# Patient Record
Sex: Male | Born: 1935 | Race: White | Hispanic: No | Marital: Married | Smoking: Never smoker
Health system: Southern US, Community
[De-identification: ages and names within clinical notes are randomized; demographics above are authoritative.]

## PROBLEM LIST (undated history)

## (undated) DIAGNOSIS — J45909 Unspecified asthma, uncomplicated: Secondary | ICD-10-CM

## (undated) DIAGNOSIS — I5042 Chronic combined systolic (congestive) and diastolic (congestive) heart failure: Secondary | ICD-10-CM

## (undated) DIAGNOSIS — I272 Pulmonary hypertension, unspecified: Secondary | ICD-10-CM

## (undated) DIAGNOSIS — I219 Acute myocardial infarction, unspecified: Secondary | ICD-10-CM

## (undated) DIAGNOSIS — F0390 Unspecified dementia without behavioral disturbance: Secondary | ICD-10-CM

## (undated) DIAGNOSIS — N139 Obstructive and reflux uropathy, unspecified: Secondary | ICD-10-CM

## (undated) DIAGNOSIS — R55 Syncope and collapse: Secondary | ICD-10-CM

## (undated) DIAGNOSIS — I10 Essential (primary) hypertension: Secondary | ICD-10-CM

## (undated) DIAGNOSIS — I2510 Atherosclerotic heart disease of native coronary artery without angina pectoris: Secondary | ICD-10-CM

## (undated) DIAGNOSIS — E119 Type 2 diabetes mellitus without complications: Secondary | ICD-10-CM

## (undated) DIAGNOSIS — I739 Peripheral vascular disease, unspecified: Secondary | ICD-10-CM

## (undated) DIAGNOSIS — I77810 Thoracic aortic ectasia: Secondary | ICD-10-CM

## (undated) DIAGNOSIS — I255 Ischemic cardiomyopathy: Secondary | ICD-10-CM

## (undated) DIAGNOSIS — K219 Gastro-esophageal reflux disease without esophagitis: Secondary | ICD-10-CM

## (undated) DIAGNOSIS — Z8719 Personal history of other diseases of the digestive system: Secondary | ICD-10-CM

## (undated) DIAGNOSIS — R0602 Shortness of breath: Secondary | ICD-10-CM

## (undated) DIAGNOSIS — I214 Non-ST elevation (NSTEMI) myocardial infarction: Secondary | ICD-10-CM

## (undated) DIAGNOSIS — N400 Enlarged prostate without lower urinary tract symptoms: Secondary | ICD-10-CM

## (undated) DIAGNOSIS — N182 Chronic kidney disease, stage 2 (mild): Secondary | ICD-10-CM

## (undated) DIAGNOSIS — K635 Polyp of colon: Secondary | ICD-10-CM

## (undated) DIAGNOSIS — E785 Hyperlipidemia, unspecified: Secondary | ICD-10-CM

## (undated) HISTORY — DX: Acute myocardial infarction, unspecified: I21.9

## (undated) HISTORY — DX: Peripheral vascular disease, unspecified: I73.9

## (undated) HISTORY — DX: Atherosclerotic heart disease of native coronary artery without angina pectoris: I25.10

## (undated) HISTORY — DX: Personal history of other diseases of the digestive system: Z87.19

## (undated) HISTORY — PX: CATARACT EXTRACTION W/ INTRAOCULAR LENS  IMPLANT, BILATERAL: SHX1307

## (undated) HISTORY — DX: Polyp of colon: K63.5

## (undated) HISTORY — DX: Benign prostatic hyperplasia without lower urinary tract symptoms: N40.0

## (undated) HISTORY — PX: RETINAL DETACHMENT SURGERY: SHX105

## (undated) HISTORY — DX: Ischemic cardiomyopathy: I25.5

## (undated) HISTORY — DX: Obstructive and reflux uropathy, unspecified: N13.9

## (undated) HISTORY — PX: SHOULDER OPEN ROTATOR CUFF REPAIR: SHX2407

## (undated) HISTORY — DX: Hyperlipidemia, unspecified: E78.5

## (undated) HISTORY — PX: INGUINAL HERNIA REPAIR: SUR1180

## (undated) HISTORY — DX: Unspecified asthma, uncomplicated: J45.909

---

## 1995-10-16 DIAGNOSIS — I219 Acute myocardial infarction, unspecified: Secondary | ICD-10-CM

## 1995-10-16 HISTORY — PX: CORONARY ARTERY BYPASS GRAFT: SHX141

## 1995-10-16 HISTORY — DX: Acute myocardial infarction, unspecified: I21.9

## 1999-06-16 HISTORY — PX: CHOLECYSTECTOMY: SHX55

## 2001-05-30 ENCOUNTER — Encounter (INDEPENDENT_AMBULATORY_CARE_PROVIDER_SITE_OTHER): Payer: Self-pay | Admitting: Specialist

## 2001-05-30 ENCOUNTER — Other Ambulatory Visit: Admission: RE | Admit: 2001-05-30 | Discharge: 2001-05-30 | Payer: Self-pay | Admitting: Gastroenterology

## 2002-02-17 ENCOUNTER — Encounter: Payer: Self-pay | Admitting: Orthopedic Surgery

## 2002-02-24 ENCOUNTER — Inpatient Hospital Stay (HOSPITAL_COMMUNITY): Admission: RE | Admit: 2002-02-24 | Discharge: 2002-02-25 | Payer: Self-pay | Admitting: Orthopedic Surgery

## 2003-05-18 ENCOUNTER — Encounter: Admission: RE | Admit: 2003-05-18 | Discharge: 2003-05-18 | Payer: Self-pay | Admitting: Otolaryngology

## 2003-05-18 ENCOUNTER — Encounter: Payer: Self-pay | Admitting: Otolaryngology

## 2003-06-03 ENCOUNTER — Ambulatory Visit (HOSPITAL_COMMUNITY): Admission: EM | Admit: 2003-06-03 | Discharge: 2003-06-03 | Payer: Self-pay | Admitting: Otolaryngology

## 2003-12-01 ENCOUNTER — Ambulatory Visit (HOSPITAL_COMMUNITY): Admission: RE | Admit: 2003-12-01 | Discharge: 2003-12-01 | Payer: Self-pay | Admitting: Pulmonary Disease

## 2003-12-06 ENCOUNTER — Ambulatory Visit (HOSPITAL_COMMUNITY): Admission: RE | Admit: 2003-12-06 | Discharge: 2003-12-06 | Payer: Self-pay | Admitting: Pulmonary Disease

## 2004-10-20 ENCOUNTER — Ambulatory Visit: Payer: Self-pay | Admitting: Internal Medicine

## 2004-10-27 ENCOUNTER — Ambulatory Visit: Payer: Self-pay | Admitting: Internal Medicine

## 2004-11-23 ENCOUNTER — Ambulatory Visit: Payer: Self-pay | Admitting: Gastroenterology

## 2004-12-11 ENCOUNTER — Ambulatory Visit: Payer: Self-pay | Admitting: Gastroenterology

## 2005-03-23 ENCOUNTER — Ambulatory Visit (HOSPITAL_COMMUNITY): Admission: RE | Admit: 2005-03-23 | Discharge: 2005-03-23 | Payer: Self-pay | Admitting: Internal Medicine

## 2005-03-23 ENCOUNTER — Ambulatory Visit: Payer: Self-pay | Admitting: Internal Medicine

## 2005-10-15 HISTORY — PX: NASAL SEPTUM SURGERY: SHX37

## 2005-10-18 ENCOUNTER — Ambulatory Visit: Payer: Self-pay | Admitting: Internal Medicine

## 2005-10-20 ENCOUNTER — Emergency Department (HOSPITAL_COMMUNITY): Admission: EM | Admit: 2005-10-20 | Discharge: 2005-10-20 | Payer: Self-pay | Admitting: Emergency Medicine

## 2005-10-22 ENCOUNTER — Ambulatory Visit: Payer: Self-pay | Admitting: Internal Medicine

## 2006-04-16 ENCOUNTER — Ambulatory Visit: Payer: Self-pay | Admitting: Internal Medicine

## 2006-07-31 ENCOUNTER — Inpatient Hospital Stay (HOSPITAL_COMMUNITY): Admission: AD | Admit: 2006-07-31 | Discharge: 2006-08-06 | Payer: Self-pay | Admitting: Internal Medicine

## 2006-07-31 ENCOUNTER — Ambulatory Visit: Payer: Self-pay | Admitting: Internal Medicine

## 2006-08-05 ENCOUNTER — Ambulatory Visit: Payer: Self-pay | Admitting: Internal Medicine

## 2006-08-09 ENCOUNTER — Ambulatory Visit: Payer: Self-pay | Admitting: Internal Medicine

## 2006-09-04 ENCOUNTER — Ambulatory Visit: Payer: Self-pay | Admitting: Internal Medicine

## 2006-09-06 ENCOUNTER — Ambulatory Visit: Payer: Self-pay | Admitting: Cardiology

## 2006-09-11 ENCOUNTER — Ambulatory Visit: Payer: Self-pay | Admitting: Internal Medicine

## 2006-09-11 LAB — CONVERTED CEMR LAB
ALT: 17 units/L (ref 0–40)
AST: 20 units/L (ref 0–37)
Albumin: 3.6 g/dL (ref 3.5–5.2)
Alkaline Phosphatase: 64 units/L (ref 39–117)
CO2: 28 meq/L (ref 19–32)
Creatinine, Ser: 0.9 mg/dL (ref 0.4–1.5)
Glomerular Filtration Rate, Af Am: 105 mL/min/{1.73_m2}
Glucose, Bld: 168 mg/dL — ABNORMAL HIGH (ref 70–99)
HDL: 36.1 mg/dL — ABNORMAL LOW (ref >39.0)
LDL DIRECT: 173.9 mg/dL
Potassium: 4.2 meq/L (ref 3.5–5.1)
Sodium: 136 meq/L (ref 135–145)
Total Bilirubin: 0.8 mg/dL (ref 0.3–1.2)
VLDL: 19 mg/dL (ref 0–40)

## 2006-09-12 ENCOUNTER — Ambulatory Visit: Payer: Self-pay | Admitting: Internal Medicine

## 2006-10-01 ENCOUNTER — Ambulatory Visit: Payer: Self-pay | Admitting: Internal Medicine

## 2006-10-01 LAB — CONVERTED CEMR LAB
Albumin: 3.5 g/dL (ref 3.5–5.2)
Chol/HDL Ratio, serum: 4.4
LDL Cholesterol: 110 mg/dL — ABNORMAL HIGH (ref 0–99)
Total Bilirubin: 0.7 mg/dL (ref 0.3–1.2)
Triglyceride fasting, serum: 91 mg/dL (ref 0–149)

## 2006-10-03 ENCOUNTER — Ambulatory Visit: Payer: Self-pay | Admitting: Cardiology

## 2006-10-07 ENCOUNTER — Inpatient Hospital Stay (HOSPITAL_COMMUNITY): Admission: EM | Admit: 2006-10-07 | Discharge: 2006-10-10 | Payer: Self-pay | Admitting: Emergency Medicine

## 2006-10-14 ENCOUNTER — Ambulatory Visit: Payer: Self-pay | Admitting: Gastroenterology

## 2006-11-05 ENCOUNTER — Ambulatory Visit: Payer: Self-pay | Admitting: Internal Medicine

## 2006-11-05 LAB — CONVERTED CEMR LAB
ALT: 17 units/L (ref 0–40)
Albumin: 3.7 g/dL (ref 3.5–5.2)
Alkaline Phosphatase: 70 units/L (ref 39–117)
BUN: 16 mg/dL (ref 6–23)
CO2: 31 meq/L (ref 19–32)
GFR calc non Af Amer: 99 mL/min
Glucose, Bld: 110 mg/dL — ABNORMAL HIGH (ref 70–99)
Potassium: 4.4 meq/L (ref 3.5–5.1)
Total Bilirubin: 0.8 mg/dL (ref 0.3–1.2)
Total Protein: 6.7 g/dL (ref 6.0–8.3)

## 2006-11-28 ENCOUNTER — Ambulatory Visit (HOSPITAL_COMMUNITY): Admission: RE | Admit: 2006-11-28 | Discharge: 2006-11-28 | Payer: Self-pay | Admitting: Gastroenterology

## 2006-12-03 ENCOUNTER — Ambulatory Visit: Payer: Self-pay | Admitting: Gastroenterology

## 2006-12-19 ENCOUNTER — Ambulatory Visit: Payer: Self-pay | Admitting: Internal Medicine

## 2007-03-01 ENCOUNTER — Inpatient Hospital Stay (HOSPITAL_COMMUNITY): Admission: EM | Admit: 2007-03-01 | Discharge: 2007-03-16 | Payer: Self-pay | Admitting: Emergency Medicine

## 2007-03-03 ENCOUNTER — Ambulatory Visit: Payer: Self-pay | Admitting: Internal Medicine

## 2007-03-10 ENCOUNTER — Encounter (INDEPENDENT_AMBULATORY_CARE_PROVIDER_SITE_OTHER): Payer: Self-pay | Admitting: General Surgery

## 2007-03-11 ENCOUNTER — Ambulatory Visit: Payer: Self-pay | Admitting: Gastroenterology

## 2007-03-16 ENCOUNTER — Encounter: Payer: Self-pay | Admitting: Internal Medicine

## 2007-06-18 ENCOUNTER — Encounter: Payer: Self-pay | Admitting: Internal Medicine

## 2007-06-18 DIAGNOSIS — E109 Type 1 diabetes mellitus without complications: Secondary | ICD-10-CM | POA: Insufficient documentation

## 2007-06-18 DIAGNOSIS — Z860100 Personal history of colon polyps, unspecified: Secondary | ICD-10-CM | POA: Insufficient documentation

## 2007-06-18 DIAGNOSIS — Z8719 Personal history of other diseases of the digestive system: Secondary | ICD-10-CM | POA: Insufficient documentation

## 2007-06-18 DIAGNOSIS — E785 Hyperlipidemia, unspecified: Secondary | ICD-10-CM | POA: Insufficient documentation

## 2007-06-18 DIAGNOSIS — J309 Allergic rhinitis, unspecified: Secondary | ICD-10-CM | POA: Insufficient documentation

## 2007-06-18 DIAGNOSIS — N400 Enlarged prostate without lower urinary tract symptoms: Secondary | ICD-10-CM | POA: Insufficient documentation

## 2007-06-18 DIAGNOSIS — J45909 Unspecified asthma, uncomplicated: Secondary | ICD-10-CM

## 2007-06-18 DIAGNOSIS — Z951 Presence of aortocoronary bypass graft: Secondary | ICD-10-CM | POA: Insufficient documentation

## 2007-06-18 HISTORY — DX: Unspecified asthma, uncomplicated: J45.909

## 2007-09-20 ENCOUNTER — Encounter: Admission: RE | Admit: 2007-09-20 | Discharge: 2007-09-20 | Payer: Self-pay | Admitting: Orthopedic Surgery

## 2008-07-01 ENCOUNTER — Encounter: Payer: Self-pay | Admitting: Internal Medicine

## 2008-07-07 ENCOUNTER — Telehealth: Payer: Self-pay | Admitting: Internal Medicine

## 2008-07-09 ENCOUNTER — Encounter: Payer: Self-pay | Admitting: Internal Medicine

## 2008-07-22 ENCOUNTER — Encounter: Payer: Self-pay | Admitting: Internal Medicine

## 2008-07-30 ENCOUNTER — Encounter: Payer: Self-pay | Admitting: Internal Medicine

## 2008-08-10 ENCOUNTER — Encounter: Payer: Self-pay | Admitting: Internal Medicine

## 2008-08-12 ENCOUNTER — Ambulatory Visit: Payer: Self-pay | Admitting: Internal Medicine

## 2008-08-12 LAB — CONVERTED CEMR LAB
Calcium: 9.1 mg/dL (ref 8.4–10.5)
Creatinine, Ser: 0.8 mg/dL (ref 0.4–1.5)
GFR calc non Af Amer: 99 mL/min
HDL: 50 mg/dL (ref >39.0)
Hgb A1c MFr Bld: 7.7 % — ABNORMAL HIGH (ref 4.6–6.0)
LDL Cholesterol: 86 mg/dL (ref 0–99)
Total Bilirubin: 0.8 mg/dL (ref 0.3–1.2)
Total CHOL/HDL Ratio: 3
Triglycerides: 65 mg/dL (ref 0–149)

## 2008-08-19 ENCOUNTER — Ambulatory Visit: Payer: Self-pay | Admitting: Internal Medicine

## 2008-08-19 DIAGNOSIS — N529 Male erectile dysfunction, unspecified: Secondary | ICD-10-CM | POA: Insufficient documentation

## 2008-08-25 ENCOUNTER — Telehealth: Payer: Self-pay | Admitting: Internal Medicine

## 2008-09-03 ENCOUNTER — Telehealth: Payer: Self-pay | Admitting: Internal Medicine

## 2008-09-10 ENCOUNTER — Encounter: Payer: Self-pay | Admitting: Internal Medicine

## 2008-09-15 ENCOUNTER — Telehealth: Payer: Self-pay | Admitting: Internal Medicine

## 2008-10-25 ENCOUNTER — Encounter: Payer: Self-pay | Admitting: Internal Medicine

## 2008-11-05 ENCOUNTER — Encounter: Payer: Self-pay | Admitting: Internal Medicine

## 2009-04-28 ENCOUNTER — Encounter: Payer: Self-pay | Admitting: Internal Medicine

## 2009-05-09 ENCOUNTER — Encounter: Payer: Self-pay | Admitting: Internal Medicine

## 2009-05-11 ENCOUNTER — Telehealth: Payer: Self-pay | Admitting: Internal Medicine

## 2009-05-13 ENCOUNTER — Telehealth: Payer: Self-pay | Admitting: Internal Medicine

## 2009-07-04 ENCOUNTER — Encounter: Payer: Self-pay | Admitting: Internal Medicine

## 2009-08-04 ENCOUNTER — Ambulatory Visit: Payer: Self-pay | Admitting: Internal Medicine

## 2009-08-08 ENCOUNTER — Encounter: Payer: Self-pay | Admitting: Internal Medicine

## 2009-08-09 ENCOUNTER — Encounter: Admission: RE | Admit: 2009-08-09 | Discharge: 2009-08-09 | Payer: Self-pay | Admitting: General Surgery

## 2009-08-24 ENCOUNTER — Telehealth: Payer: Self-pay | Admitting: Internal Medicine

## 2009-08-24 ENCOUNTER — Encounter: Payer: Self-pay | Admitting: Internal Medicine

## 2009-08-31 ENCOUNTER — Encounter: Payer: Self-pay | Admitting: Internal Medicine

## 2009-10-22 ENCOUNTER — Encounter: Payer: Self-pay | Admitting: Internal Medicine

## 2009-10-24 ENCOUNTER — Telehealth: Payer: Self-pay | Admitting: Internal Medicine

## 2009-10-27 ENCOUNTER — Telehealth: Payer: Self-pay | Admitting: Internal Medicine

## 2009-11-07 ENCOUNTER — Encounter: Payer: Self-pay | Admitting: Internal Medicine

## 2009-11-09 ENCOUNTER — Encounter: Payer: Self-pay | Admitting: Internal Medicine

## 2010-03-09 ENCOUNTER — Ambulatory Visit: Payer: Self-pay | Admitting: Internal Medicine

## 2010-03-09 DIAGNOSIS — H8110 Benign paroxysmal vertigo, unspecified ear: Secondary | ICD-10-CM

## 2010-03-09 LAB — CONVERTED CEMR LAB
ALT: 18 units/L (ref 0–53)
Albumin: 4.2 g/dL (ref 3.5–5.2)
Calcium: 8.9 mg/dL (ref 8.4–10.5)
Cholesterol: 153 mg/dL (ref 0–200)
GFR calc non Af Amer: 99.81 mL/min (ref >60)
Glucose, Bld: 181 mg/dL — ABNORMAL HIGH (ref 70–99)
HDL: 47.4 mg/dL (ref >39.00)
Hgb A1c MFr Bld: 6.8 % — ABNORMAL HIGH (ref 4.6–6.5)
Sodium: 137 meq/L (ref 135–145)
Total Protein: 6.6 g/dL (ref 6.0–8.3)
Triglycerides: 84 mg/dL (ref 0.0–149.0)
VLDL: 16.8 mg/dL (ref 0.0–40.0)

## 2010-03-10 ENCOUNTER — Encounter: Payer: Self-pay | Admitting: Internal Medicine

## 2010-08-10 ENCOUNTER — Encounter: Payer: Self-pay | Admitting: Internal Medicine

## 2010-08-10 ENCOUNTER — Telehealth: Payer: Self-pay | Admitting: Internal Medicine

## 2010-08-10 LAB — HM DIABETES FOOT EXAM: HM Diabetic Foot Exam: NORMAL

## 2010-09-15 ENCOUNTER — Encounter: Payer: Self-pay | Admitting: Internal Medicine

## 2010-11-13 ENCOUNTER — Encounter: Payer: Self-pay | Admitting: Internal Medicine

## 2010-11-16 NOTE — Letter (Signed)
[facility] [address] [place], [place]  [address] Phone: [phone]      Mar 14, 2010   [name] [address] [place], [place] [address]  RE:  LAB RESULTS  Dear  [name],  The following is an interpretation of your most recent lab tests.  Please take note of any instructions provided or changes to medications that have resulted from your lab work.  ELECTROLYTES:  Good - no changes needed  LIVER FUNCTION TESTS:  Good - no changes needed  Health professionals look at cholesterol as more involved than just the total cholesterol. We consider the level of LDL (bad) cholesterol, HDL (good), cholesterol, and Triglycerides (Grease) in the blood.  1. Your LDL should be under 100, and the HDL should be over 45, if you have any vascular disease such as heart attack, angina, stroke, TIA (mini stroke), claudication (pain in the legs when you walk due to poor circulation),  Abdominal Aortic Aneurysm (AAA), diabetes or prediabetes.  2. Your LDL should be under 130 if you have any two of the following:     a. Smoke or chew tobacco,     b. High blood pressure (if you are on medication or over 140/90 without medication),     c. Male gender,    d. HDL below 40,    e. A male relative (father, brother, or son), who have had any vascular event          as described in #1. above under the age of 74, or a male relative (mother,       sister, or daughter) who had an event as described above under age 15. (An HDL over 60 will subtract one risk factor from the total, so if you have two items in # 2 above, but an HDL over 60, you then fall into category # 3 below).  3. Your LDL should be under 160 if you have any one of the above.  Triglycerides should be under 200 with the ideal being under 150.  For diabetes or pre-diabetes, the ideal HgbA1C should be under 6.0%.  If you fall into any of the above categories, you should make a follow up appointment to discuss this with  your physician.  LIPID PANEL:  Good - no changes needed Triglyceride: 84.0   Cholesterol: 153   LDL: 89   HDL: 47.40   Chol/HDL%:  3  DIABETIC STUDIES:  Good - no changes needed Blood Glucose: 181   HgbA1C: 6.8      Good control of diabetes and cholesterol. Keep up the good work.    Sincerely Yours,    Jacques Navy MD Patient: [name] Note: All result statuses are Final unless otherwise noted.  Tests: (1) Lipid Panel (LIPID)   Cholesterol               153 mg/dL                   0-109     ATP III Classification            Desirable:  < 200 mg/dL                    Borderline High:  200 - 239 mg/dL               High:  > = 240 mg/dL   Triglycerides  84.0 mg/dL                  1.6-109.6     Normal:  <150 mg/dL     Borderline High:  045 - 199 mg/dL   HDL                       40.98 mg/dL                 >11.91   VLDL Cholesterol          16.8 mg/dL                  4.7-82.9   LDL Cholesterol           89 mg/dL                    5-62  CHO/HDL Ratio:  CHD Risk                             3                    Men          Women     1/2 Average Risk     3.4          3.3     Average Risk          5.0          4.4     2X Average Risk          9.6          7.1     3X Average Risk          15.0          11.0                           Tests: (2) Hepatic/Liver Function Panel (HEPATIC)   Total Bilirubin           0.6 mg/dL                   1.3-0.8   Direct Bilirubin          0.1 mg/dL                   6.5-7.8   Alkaline Phosphatase      74 U/L                      39-117   AST                       22 U/L                      0-37   ALT                       18 U/L                      0-53   Total Protein             6.6 g/dL                    4.6-9.6   Albumin  4.2 g/dL                    0.9-8.1  Tests: (3) Hemoglobin A1C (A1C)   Hemoglobin A1C       [H]  6.8 %                       4.6-6.5     Glycemic Control Guidelines for People  with Diabetes:     Non Diabetic:  <6%     Goal of Therapy: <7%     Additional Action Suggested:  >8%   Tests: (4) BMP (METABOL)   Sodium                    137 mEq/L                   135-145   Potassium                 4.2 mEq/L                   3.5-5.1   Chloride                  103 mEq/L                   96-112   Carbon Dioxide            27 mEq/L                    19-32   Glucose              [H]  181 mg/dL                   19-14   BUN                       22 mg/dL                    7-82   Creatinine                0.8 mg/dL                   9.5-6.2   Calcium                   8.9 mg/dL                   1.3-08.6   GFR                       99.81 mL/min                >60

## 2010-11-16 NOTE — Progress Notes (Signed)
Summary: TESTING SUPPLIES  Phone Note Call from Patient   Summary of Call: Pt's wife called regarding diabetic testing supplies order. Informed pt that form was completed by Dr and will be faxed today Initial call taken by: Lamar Sprinkles, CMA,  October 24, 2009 9:55 AM

## 2010-11-16 NOTE — Letter (Signed)
Summary: [name]  [facility]   Imported By: Lennie Odor 08/14/2010 09:07:17  _____________________________________________________________________  External Attachment:    Type:   Image     Comment:   External Document

## 2010-11-16 NOTE — Letter (Signed)
Summary: [facility]  [facility]   Imported By: Sherian Rein 11/14/2009 11:58:25  _____________________________________________________________________  External Attachment:    Type:   Image     Comment:   External Document

## 2010-11-16 NOTE — Progress Notes (Signed)
  Phone Note Refill Request Message from:  Fax from Pharmacy on October 24, 2009 9:20 AM  Refills Requested: Medication #1:  ACTOS 30 MG  TABS once daily Initial call taken by: Ami Bullins CMA,  October 24, 2009 9:20 AM    Prescriptions: ACTOS 30 MG  TABS (PIOGLITAZONE HCL) once daily  #90 x 3   Entered by:   Ami Bullins CMA   Authorized by:   Jacques Navy MD   Signed by:   Bill Salinas CMA on 10/24/2009   Method used:   Electronically to        [facility]* (mail-order)             ,          Ph: [phone]       Fax: [phone]   RxID:   [id number]

## 2010-11-16 NOTE — Assessment & Plan Note (Signed)
Summary: LOOSING WEIGHT/NWS   Vital Signs:  Patient profile:   77 year old male Weight:      191 pounds O2 Sat:      95 % on Room air Temp:     97.2 degrees F oral Pulse rate:   73 / minute BP sitting:   126 / 60  (left arm) Cuff size:   regular  Vitals Entered By: Bill Salinas CMA (Mar 09, 2010 10:57 AM)  O2 Flow:  Room air CC: pt here with c/o weight loss and being off balence x 3 months/ ab   Primary Care Provider:  Jacques Navy MD  CC:  pt here with c/o weight loss and being off balence x 3 months/ ab.  History of Present Illness: Patient reports he has had increasing balance trouble that is associated with position change.  He has lost 11 lbs over the past 3 months. He admits that he is eating less. NO change in bowel habit, no night sweats. He has had much better blood sugar control on Lantus which may account for weight loss.  He reports that his blood sugar is 90-100 on Lantus 25 units at bedtime.   Current Medications (verified): 1)  Metoprolol Succinate 50 Mg  Tb24 (Metoprolol Succinate) .... 1/2 Two Times A Day 2)  Plavix 75 Mg  Tabs (Clopidogrel Bisulfate) .... Once Daily 3)  Ecotrin 325mg  .... Once Daily 4)  Multivitamins   Tabs (Multiple Vitamin) .... Take 1 Tablet By Mouth Once A Day 5)  Lantus Solostar 100 Unit/ml Soln (Insulin Glargine) .... 25 Units and Titrate Up. 6)  Doxazosin Mesylate 4 Mg  Tabs (Doxazosin Mesylate) .... Once Daily 7)  Actos 30 Mg  Tabs (Pioglitazone Hcl) .... Once Daily 8)  Advair Diskus 100-50 Mcg/dose  Misc (Fluticasone-Salmeterol) .... One Inhalation Two Times A Day 9)  Cialis 20 Mg Tabs (Tadalafil) .Marland Kitchen.. 1 As Needed  Allergies (verified): 1)  ! * Vytorin 2)  ! * Welchol PMH-FH-SH reviewed-no changes except otherwise noted  Review of Systems       The patient complains of weight loss and decreased hearing.  The patient denies anorexia, fever, chest pain, dyspnea on exertion, abdominal pain, severe indigestion/heartburn,  muscle weakness, difficulty walking, unusual weight change, and enlarged lymph nodes.    Physical Exam  General:  WNWD white male in no distress Head:  normocephalic and atraumatic.   Eyes:  pupils equal, pupils round, corneas and lenses clear, and no injection.   Neck:  supple.   Lungs:  normal respiratory effort and normal breath sounds.   Heart:  normal rate, regular rhythm, and no murmur.   Abdomen:  soft and normal bowel sounds.   Msk:  no joint tenderness, no joint swelling, and no joint deformities.   Pulses:  2+ radial Neurologic:  alert & oriented X3, cranial nerves II-XII intact, and gait normal.   Skin:  turgor normal and color normal.   Psych:  Oriented X3, memory intact for recent and remote, and good eye contact.     Impression & Recommendations:  Problem # 1:  BENIGN POSITIONAL VERTIGO (ICD-386.11) Reviewed mechanism with patient (cartoon included). His symptoms are mild.  Plan - "Rule of 20"  Problem # 2:  HYPERLIPIDEMIA (ICD-272.4) Forlab today. recommendations to follow  Orders: TLB-Lipid Panel (80061-LIPID) TLB-Hepatic/Liver Function Pnl (80076-HEPATIC)  Addendum - LDL 89 - at goal  Problem # 3:  DIABETES MELLITUS, TYPE I (ICD-250.01) Due for A1C.  His updated medication list for  this problem includes:    Lantus Solostar 100 Unit/ml Soln (Insulin glargine) .Marland Kitchen... 25 units and titrate up.    Actos 30 Mg Tabs (Pioglitazone hcl) ..... Once daily  Orders: TLB-A1C / Hgb A1C (Glycohemoglobin) (83036-A1C) TLB-BMP (Basic Metabolic Panel-BMET) (80048-METABOL)  Addendum - A1C 6.8%  Plan continue present medications.   Complete Medication List: 1)  Metoprolol Succinate 50 Mg Tb24 (Metoprolol succinate) .... 1/2 two times a day 2)  Plavix 75 Mg Tabs (Clopidogrel bisulfate) .... Once daily 3)  Ecotrin 325mg   .... Once daily 4)  Multivitamins Tabs (Multiple vitamin) .... Take 1 tablet by mouth once a day 5)  Lantus Solostar 100 Unit/ml Soln (Insulin glargine)  .... 25 units and titrate up. 6)  Doxazosin Mesylate 4 Mg Tabs (Doxazosin mesylate) .... Once daily 7)  Actos 30 Mg Tabs (Pioglitazone hcl) .... Once daily 8)  Advair Diskus 100-50 Mcg/dose Misc (Fluticasone-salmeterol) .... One inhalation two times a day 9)  Cialis 20 Mg Tabs (Tadalafil) .Marland Kitchen.. 1 as needed Prescriptions: CIALIS 20 MG TABS (TADALAFIL) 1 as needed  #15 x 3   Entered and Authorized by:   Jacques Navy MD   Signed by:   Jacques Navy MD on 03/09/2010   Method used:   Electronically to        [facility]* (mail-order)             ,          Ph: [phone]       Fax: [phone]   RxID:   [id number]

## 2010-11-16 NOTE — Medication Information (Signed)
Summary: [facility]  [facility]   Imported By: Sherian Rein 10/25/2009 14:16:40  _____________________________________________________________________  External Attachment:    Type:   Image     Comment:   External Document

## 2010-11-16 NOTE — Letter (Signed)
Summary: [facility]  [facility]   Imported By: [place] 10/02/2010 10:07:17  _____________________________________________________________________  External Attachment:    Type:   Image     Comment:   External Document

## 2010-11-16 NOTE — Progress Notes (Signed)
     Diabetes Management Exam:    Eye Exam:       Eye Exam done elsewhere          Date: 08/10/2010          Results: normal          Done by: Dr Jethro Bolus

## 2010-11-16 NOTE — Letter (Signed)
Summary: [facility]  [facility]   Imported By: Sherian Rein 03/25/2010 08:51:06  _____________________________________________________________________  External Attachment:    Type:   Image     Comment:   External Document

## 2010-11-16 NOTE — Progress Notes (Signed)
  Phone Note Refill Request Message from:  Fax from Pharmacy on October 27, 2009 2:36 PM  Refills Requested: Medication #1:  LANTUS SOLOSTAR 100 UNIT/ML SOLN 25 units and titrate up. Initial call taken by: Ami Bullins CMA,  October 27, 2009 2:36 PM    Prescriptions: LANTUS SOLOSTAR 100 UNIT/ML SOLN (INSULIN GLARGINE) 25 units and titrate up.  #9 pens x 3   Entered by:   Ami Bullins CMA   Authorized by:   Jacques Navy MD   Signed by:   Bill Salinas CMA on 10/27/2009   Method used:   Electronically to        [facility]* (mail-order)             ,          Ph: [phone]       Fax: [phone]   RxID:   [id number]

## 2010-11-30 NOTE — Letter (Signed)
Summary: [facility]  [facility]   Imported By: Benigno Palermo 11/23/2010 09:15:26  _____________________________________________________________________  External Attachment:    Type:   Image     Comment:   External Document

## 2011-02-09 ENCOUNTER — Telehealth: Payer: Self-pay | Admitting: *Deleted

## 2011-02-09 MED ORDER — INSULIN GLARGINE 100 UNIT/ML ~~LOC~~ SOLN: 25.0000 [IU] | Freq: Every day | SUBCUTANEOUS | Status: DC

## 2011-02-09 NOTE — Telephone Encounter (Signed)
Needs lantus where pt is on vacation - done, need to call pt to inform

## 2011-02-09 NOTE — Telephone Encounter (Signed)
Patient informed. 

## 2011-02-27 NOTE — Discharge Summary (Signed)
NAMEJIMMY, [name] NO.:  [id number]   MEDICAL RECORD NO.:  [id number]          PATIENT TYPE:  INP   LOCATION:  5708                         FACILITY:  [facility]   PHYSICIAN:  Gordy Savers, MDDATE OF BIRTH:  1925/11/27   DATE OF ADMISSION:  03/01/2007  DATE OF DISCHARGE:  03/16/2007                               DISCHARGE SUMMARY   FINAL DIAGNOSIS:  Abdominal pain secondary to acute cholecystitis.   PROCEDURE:  Open cholecystectomy.   ADDITIONAL DIAGNOSES:  1. Diabetes.  2. Coronary artery disease.  3. History of pancreatitis.  4. Hypertension.  5. History of asthma.  6. Hhyperlipidemia.   DISCHARGE MEDICATIONS:  1. Vicodin 1 tablet every 4 hours p.r.n. pain.  2. Novalin 70/30, 12 units before breakfast and evening meal.  3. Actos 30 mg daily.  4. Advair 100/50 one puff twice daily.  5. Doxazosin 4 mg daily.  6. Plavix 75 mg daily.  7. Metoprolol 50 mg 1/2 tablet twice daily.  8. Aspirin 81 mg daily.   HISTORY OF PRESENT ILLNESS:  The patient is a 77 year old gentleman who  was admitted with onset of significant lower abdominal pain.  This was  associated with nausea and vomiting.  He had experienced similar  symptoms associated with pancreatitis in the past.  The patient was  subsequently admitted to the hospital with abdominal pain, nausea and  vomiting with suspected pancreatitis.   LABORATORY DATA AND HOSPITAL COURSE:  The patient was admitted to the  hospital and was seen in consultation by the gastrointestinal service.  Abdominal ultrasound revealed gallbladder sludge and wall thickening  consistent with acalculous cholecystitis.  Amylase and lipase were  normal.  A surgical consult was obtained who felt that the patient had  acute cholecystitis.  Surgery was scheduled after several days of  discontinuation of Plavix.  The patient had an aborted laparoscopic  procedure and required open cholecystectomy which was performed on Mar 10, 2007.  Postoperatively the patient did quite well with some mild  ileus.  His diet was advanced and at the time of discharge was  tolerating a regular diet and ambulatory in the hall.  Laboratory  studies prior to discharge included albumin 2.3, BUN and creatinine 3  and 0.71 respectively.  Reported surgical pathology revealed acute  cholecystitis of the gallbladder.  There were no calculi present.   DISPOSITION:  The patient was discharged today on the medical regimen  listed above.   FOLLOWUP:  He will follow up with his general surgeon within the next  few days.   CONDITION ON DISCHARGE:  Stable.      Gordy Savers, MD  Electronically Signed     PFK/MEDQ  D:  03/16/2007  T:  03/16/2007  Job:  [id number]

## 2011-02-27 NOTE — Op Note (Signed)
[name], [name]                ACCOUNT NO.:  [id number]   MEDICAL RECORD NO.:  [id number]          PATIENT TYPE:  INP   LOCATION:  5708                         FACILITY:  [facility]   PHYSICIAN:  Ollen Gross. Vernell Morgans, M.D. DATE OF BIRTH:  Apr 04, 1926   DATE OF PROCEDURE:  03/10/2007  DATE OF DISCHARGE:                               OPERATIVE REPORT   PREOPERATIVE DIAGNOSIS:  Cholecystitis.   POSTOPERATIVE DIAGNOSIS:  Cholecystitis.   PROCEDURES:  Aborted laparoscopic and subsequent open cholecystectomy.   SURGEON:  Dr. Carolynne Edouard   ASSISTANT:  Dr. Jamey Ripa   ANESTHESIA:  General endotracheal.   PROCEDURE:  After informed consent was obtained, the patient was brought  to the operating room and placed in the supine position on the operating  table.  After induction of general anesthesia, the patient's abdomen was  prepped with Betadine, draped in usual sterile manner.  The area below  his umbilicus was infiltrated with 0.25% Marcaine.  A small incision was  made with a 15-blade knife.  This incision was carried down through the  subcutaneous tissue bluntly with a hemostat and [employer] retractors  until the linea was identified.  The linea was incised with a 15-blade  knife and each side was grasped Kocher clamps and elevated anteriorly.  The preperitoneal space was probed bluntly with a hemostat until the  peritoneum was opened and access was gained to the abdominal cavity.  A  0 Vicryl pursestring stitch was placed in the fascia around the opening.  Hasson cannula was placed through the opening and anchored in place with  previous placed 0 Vicryl pursestring stitch.  The abdomen was then  insufflated with carbon dioxide without difficulty.  The patient was  placed in the head-up position, rotated with the right side up.  Laparoscope was inserted through the Hasson cannula and the right  quadrant was inspected.  The liver was readily identified.  There were a  lot of omental adhesions to  the gallbladder.  The epigastric region was  then infiltrated with 0.25% Marcaine.  A small incision was made with a  15-blade knife and a 10-mm port was placed bluntly through this incision  into the abdominal cavity under direct vision.  Sites were then chosen  laterally on the right side of the abdomen with placement of 5-mm ports.  Each of these areas were infiltrated 0.25% Marcaine.  Small stab  incisions were made with a 15-blade knife and 5-mm ports were placed  bluntly through these incisions into the abdominal cavity under direct  vision.  A blunt grasper was placed through the lateral most 5-mm port  and used to grasp the dome of the gallbladder and elevate it anteriorly  and superiorly.  Another blunt grasper was placed through the other 5-mm  port and used to retract the body and neck of the gallbladder.  A  dissector was placed through the epigastric port and blunt dissection  was used to take these omental adhesions down off the body of the  gallbladder, but they were very densely stuck near the base of the  gallbladder.  The adhesions were so dense that we were unable to  identify any structures.  The base of the gallbladder was also somewhat  inflamed and necrotic, and then we entered the body of the gallbladder  trying to take some of these adhesions down. At this point, the  laparoscopy portion of the case was aborted. A right subcostal incision  was made with a 10-blade knife.  This incision was carried down through  the skin and subcutaneous tissue, fascia and muscle of the abdominal  wall until the abdomen was entered.  A Bookwalter retractor was  deployed.  The gallbladder was removed from the liver using a top-down  dissection technique using a tonsil clamp and electrocautery near the  base of the gallbladder.  We entered the gallbladder in order to try to  find the cystic duct.  We were able to find the area of the cystic duct,  but we were unable feed a  cholangiogram catheter into it.  Because of  this, we cut the body of the bottom of the gallbladder off right at the  junction of the gallbladder neck and cystic duct.  We put a 2-0 silk tie  around the cystic duct stump as well as closed it with a 2-0 Vicryl  figure-of-8 stitch, and the gallbladder was removed. We were able to  cautery the liver until it was hemostatic.  We irrigated with copious  amounts of saline and watched the area for several minutes, and there  was no apparent bile leak.  We placed large pieces of Surgicel on the  liver bed and over the cystic duct stump.  We brought a 19-French round  Blake drain into the abdomen and out through one of the 5-mm port site  incisions and anchored the drain to the skin with 3-0 nylon stitch.  We  then closed the infraumbilical incision with previously placed Vicryl  pursestring stitch as well as with another interrupted 0 Vicryl stitch.  We closed the right subcostal incision with two layers of #1 running PDS  suture, irrigating each layer and then the skin incisions were all  closed with staples.  The drain was placed to bulb suction.  Sterile  dressings were then applied.  The patient tolerated the procedure well.  At the end of the case, all needle, sponge and instrument counts were  correct.  The patient was then awakened, taken to recovery in stable  condition.      Ollen Gross. Vernell Morgans, M.D.  Electronically Signed     PST/MEDQ  D:  03/10/2007  T:  03/10/2007  Job:  [id number]

## 2011-02-27 NOTE — H&P (Signed)
[name], [name] NO.:  [id number]   MEDICAL RECORD NO.:  [id number]          PATIENT TYPE:  INP   LOCATION:  5708                         FACILITY:  [facility]   PHYSICIAN:  Willow Ora, MD           DATE OF BIRTH:  1925-11-06   DATE OF ADMISSION:  03/01/2007  DATE OF DISCHARGE:                              HISTORY & PHYSICAL   CHIEF COMPLAINT:  Abdominal pain.   HISTORY OF PRESENT ILLNESS:  [name] is a 77 year old gentleman who  was prescribed Levaquin for sinusitis 3 days ago.  He reports that the  sinus symptoms are starting to get better.  However, today at around  4:00 p.m. he developed lower abdominal pain without radiation along with  nausea and vomiting.  The symptoms are quite similar to his previous  episodes of pancreatitis so he presented to the emergency room.  His  appetite is quite decreased and he has not tried to eat anything since  this afternoon.   PAST MEDICAL HISTORY:  1. Insulin-dependent diabetes.  2. Coronary artery disease status post CABG in 1994.  3. History of pancreatitis.  First episode developing October 2007 and      it was thought to be due to Vytorin.  He had a second episode on      December 2007 thought to be related to [place] and Welchol.  4. Hypertension.  5. Bilateral hernia repair, appendectomy, vasectomy.  6. High cholesterol.  7. History of asthma and allergies.  8. History of colon polyps followed up by [name]   FAMILY HISTORY:  Noncontributory.   SOCIAL HISTORY:  Does not smoke and rarely drinks wine.   REVIEW OF SYSTEMS:  He had mild subjective fever only today.  He had  normal bowel movements without any blood.  Denies any hematemesis.  No  chest pain or shortness of breath.  He admits to cough for a few days.  No dysuria or blood in the urine.   MEDICATIONS:  1. He takes Novolin 70/30 approximately 12 units twice a day.  2. Actos 30 one p.o. daily.  3. Advair, dose unknown.  4. Doxazosin 4 mg one  p.o. daily.  5. Plavix once a day.  6. Metoprolol 50 half p.o. b.i.d.  7. Levaquin for the last 3 days.  8. Aspirin 81.   ALLERGIES:  The patient is intolerant to VYTORIN, ZETIA and WELCHOL.   PHYSICAL EXAM:  The patient is alert, oriented, in mild distress due to  pain, afebrile, heart rate 52, blood pressure 121/58, respirations 18,  O2 sat 96% on room air.  LUNGS:  He has a few rhonchi bilaterally but no wheezing.  CARDIOVASCULAR: Bradycardic without murmur.  ABDOMEN:  Nondistended, soft.  It is tender without rebound at the lower  abdomen more so than the upper abdomen.  No hepatosplenomegaly.  EXTREMITIES: No edema.  NEURO:  Motor and  speech is normal.   LABORATORY AND X-RAYS:  White count 9.4, hemoglobin 12.6, platelets 209.   ASSESSMENT AND PLAN:  1. The patient is admitted to the  hospital with abdominal pain,      nausea, vomiting.  Symptoms are quite similar to his previous      pancreatitis.  Most of the blood work and x-rays are pending.      However, the patient is unwilling to try any p.o. at this time and      consequently, he will be admitted for observation.  Again, amylase,      lipase and comprehensive panel are pending.  We are also ordering      plain x-rays of the abdomen.  2. Diabetes.  Will hold Actos and the 70/30 insulin and put him on a      sliding scale of insulin.  3. History of coronary artery disease.  He seems to be a seen      asymptomatic.  4. History of asthma, continue with Advair.  5. Hypertension that seems to be stable.  6. Diagnosed recently with sinusitis.  White count is normal.  He is      afebrile.  Will prescribe Flonase and consider an alternative      antibiotic, since Levaquin may be the culprit of his abdominal      pain.      Willow Ora, MD  Electronically Signed     JP/MEDQ  D:  03/01/2007  T:  03/02/2007  Job:  [id number]   cc:   [name], MD

## 2011-02-27 NOTE — Consult Note (Signed)
[name], [name]                ACCOUNT NO.:  [id number]   MEDICAL RECORD NO.:  [id number]          PATIENT TYPE:  INP   LOCATION:  5708                         FACILITY:  [facility]   PHYSICIAN:  Ollen Gross. Vernell Morgans, M.D. DATE OF BIRTH:  01/19/1926   DATE OF CONSULTATION:  03/06/2007  DATE OF DISCHARGE:                                 CONSULTATION   PRIMARY CARE PHYSICIAN:  [name], M.D.   GASTROENTEROLOGIST:  [name], MD, Clementeen Graham, FACP, FAGA.   CARDIOLOGIST:  [name], M.D.   REASON FOR CONSULTATION:  Suspected cholecystitis.   HISTORY OF PRESENT ILLNESS:  [name] is a 77 year old male patient  known to me from prior consultation in December2007 for recurrent  pancreatitis.  His initial episode was felt to be related to Vytorin.  This was in November2007.  He was readmitted in December2007, at  that time there was some suspicion that the patient may have biliary-  related pancreatitis, but after an extensive workup, including an  ultrasound which did not reveal any evidence of biliary etiology for the  pancreatitis, it was felt that the patient's Welchol and Zetia possibly,  were the etiology of the pancreatitis.  The patient also had some  pancreatic head inflammation with mild extrinsic compression of the  common bile duct without evidence of choledocholithiasis.  This was  found on MRCP.  There was again no confirmation of a mass or any  malignancy on the MRCP, and therefore no surgical indications to  continue to follow the patient.  Since discharge, the patient has been  following with gastroenterology and he actually has undergone a  panendoscopy with endoscopy ultrasound on November 28, 2006, and this  essentially shows no acute problems.   The patient was readmitted Mar 01, 2007, with nausea and vomiting and  abdominal pain.  About 3 days before coming in, he had sinusitis and was  treated with Levaquin and then developed abdominal pain in the  lower  abdomen.  In addition, he reported to the gastroenterology PA that he  has had 2 weeks of intermittent upper mid abdominal pain without any  particular pattern.  A CT scan was performed today that did demonstrate  gallbladder distension with gallbladder wall thickening and irregular  mucous enhancement with associated inflammation and edema suggestive of  acute cholecystitis.  These inflammatory changes track down through the  duodenal bulb to the region of the pancreatic head and could be related  to pancreatitis as well.  Because of these findings, a surgical  evaluation has been requested.   REVIEW OF SYSTEMS:  There has been no pattern to the patient's pain.  He  has no postprandial symptoms nor any food intolerances.  And otherwise,  review of systems is noncontributory.   PAST MEDICAL HISTORY:  1. Coronary artery disease, followed by Dr. Allyson Sabal.  2. Insulin-dependent-diabetes mellitus.  3. Dyslipidemia.  4. Hypertension.  5. Prior pancreatitis secondary to use of Vytorin and Welchol.  6. History of asthma.  7. History of prior colon polyps.   PAST SURGICAL HISTORY:  1. CABG x5  in 1997.  2. Nasal polypectomy in 2004.  3. Repair of right rotator cuff in 2003.  4. Bilateral inguinal hernia repairs in the 1950s.   ALLERGIES:  NKDA, OTHER THAN INTOLERANT TO VYTORIN, ZETIA, AND WELCHOL  AS PER THE PROBLEMS LISTED RELATED TO PANCREATITIS.   MEDICATIONS:  While here include:  Cardura, Plavix, aspirin, Lopressor,  Nasonex, Advair, Tylox for pain, sliding scale insulin, Lovenox for DVT  prophylaxis, and various p.r.n. medications.  He has been on Zosyn but  this was discontinued on Mar 04, 2007 by Dr. Christella Hartigan.   PHYSICAL EXAMINATION:  GENERAL:  Pleasant male patient currently  complaining of diffuse mid abdominal pain.  VITAL SIGNS:  Temperature 100.1, BP 108/59, pulse 80, respirations 19.  NEURO:  The patient is alert, oriented x3, moving all extremities x4  without focal  deficits.  HEENT:  Head normocephalic.  Sclera non-injected, nonicteric.  NECK:  Supple.  No adenopathy.  CHEST:  Bilateral lung sounds clear to auscultation.  Respiratory effort  is non-labored.  He is on room air.  CARDIAC:  S1-S2 without rubs, murmurs, thrills, or gallops.  No JVD.  Pulse is regular.  ABDOMEN:  Obese and soft.  Bowel sounds are present.  He is exquisitely  tender over the right upper quadrant consistent with a positive Murphy  sign.  He is also tender in the epigastrium.  He does not appear to be  tender at all in the left upper quadrant nor in the lower abdomen  region.  EXTREMITIES:  Symmetrical in appearance without edema, cyanosis or  clubbing.   LABORATORY:  Sodium 132, potassium 3, CO2 28, BUN 18, creatinine 0.8,  glucose 167.  Total bilirubin today is 1.2.  It has been as high as 1.3  and was 0.7 when he came in.  AST, ALT, amylase, and lipase normal.  White count has trended back upwards to 11,700, hemoglobin 11.7,  platelets 218,000.   DIAGNOSTIC STUDIES:  CT scan of the abdomen and pelvis today as noted  and panendoscopy results with endoscopic ultrasound from November 28, 2006, as noted.   IMPRESSION:  1. Right upper quadrant pain with CT findings consistent with acute      cholecystitis with recent mild transaminitis.  2. History of drug related pancreatitis, possible residual changes of      the pancreatic head extending to the duodenum.  3. Hypokalemia.  4. Low grade fever, leukocytosis.  5. History of coronary artery disease, currently on Plavix.  6. Diabetes mellitus.  7. Dyslipidemia.  8. Deep vein thrombosis prophylaxis on Lovenox.   PLAN:  1. Need to go ahead and check an ultrasound of the abdomen to help      clarify CT findings.  The patient clinically has a picture      consistent with acute cholecystitis, given the degree of pain, low     grade fevers, and elevated white count noting that the patient did      not have any right  upper quadrant pain on exam of December2007.  2. Agree with clear liquids, IV fluids, pain management, and repletion      of K.  3. Zosyn was discontinued by gastroenterology recently but his white      cell count and temperature as well as pain are increasing.  We will      discuss with Dr. Carolynne Edouard, probably need to resume Zosyn or other broad-      spectrum antibiotic coverage at this point. We will follow  up a CBC      in the morning.  4. We will go and hold the Plavix.  This needs to be out at least 5      days before presenting with surgery, and the patient is also on      Lovenox for DVT prophylaxis so this will need to be held the      morning of the surgery.  5. Additional recommendations per Dr. Carolynne Edouard.      Allison L. Rennis Harding, N.POllen Gross. Vernell Morgans, M.D.  Electronically Signed    ALE/MEDQ  D:  03/06/2007  T:  03/06/2007  Job:  [id number]   cc:   [name], MD  [name], MD, [place], FACP, FAGA  [name], M.D.  Ardeth Sportsman, MD

## 2011-03-02 NOTE — Op Note (Signed)
NAME:  [name], [name]                          ACCOUNT NO.:  [id number]   MEDICAL RECORD NO.:  [id number]                   PATIENT TYPE:  OIB   LOCATION:  2896                                 FACILITY:  [facility]   PHYSICIAN:  Suzanna Obey, M.D.                    DATE OF BIRTH:  01-06-1926   DATE OF PROCEDURE:  06/03/2003  DATE OF DISCHARGE:  06/03/2003                                 OPERATIVE REPORT   PREOPERATIVE DIAGNOSIS:  Severe epistaxis.   POSTOPERATIVE DIAGNOSIS:  Severe epistaxis.   SURGICAL PROCEDURE:  Right endoscopic control of epistaxis with  cauterization and packing.   ANESTHESIA:  General endotracheal tube.   ESTIMATED BLOOD LOSS:  Approximately 30 mL.   INDICATIONS:  This is a 77 year old who is approximately nine days out from  endoscopic sinus surgery and had cleaning in the office and started having  bleeding from the anterior aspect of his maxillary sinus antrostomy.  This  was attempted to control in the office with multiple techniques, including  Afrin/lidocaine pledgets multiple times, silver nitrate, Merocel packing,  and gauze packing.  All of these attempts have failed to control the  bleeding.  There is an obvious artery that was pumping in this region;  therefore, the patient needed to come to the operating room.  They were  informed of the risks and benefits of the procedure, including bleeding,  infection, scarring, and risk of the anesthetic.  All questions were  answered and consent was obtained.   OPERATION:  The patient was taken to the operating room and placed in the  supine position, after adequate general endotracheal tube anesthesia was  examined with a nasal endoscope and the packing that had previously been  placed, the gauze, was removed.  The Surgicel that had been placed was  removed.  Again the site that was bleeding was obvious and it appeared to be  some tissue overhanging the antrostomy, which was back-bitten off to expose  the  area better.  After this the bleeding site was cauterized with suction  cautery, which did take multiple cauteries up and down the anterior aspect  of the antrostomy to control this.  There was no bleeding from any other  site that could be identified.  The patient was given a Valsalva maneuver  and again no bleeding was present.  A Merocel sponge soaked in bacitracin  was then placed into this area and injected with saline.  The ties were  taped loosely across the bridge of the nose.  The patient was then suctioned  out of the oral cavity/oropharynx with an NG tube and the hypopharynx,  esophagus, and stomach were suctioned as well.  The patient awakened and  brought to recovery in stable condition, counts correct.  Suzanna Obey, M.D.    Cordelia Pen  D:  06/03/2003  T:  06/04/2003  Job:  [id number]

## 2011-03-02 NOTE — Assessment & Plan Note (Signed)
[facility]                         GASTROENTEROLOGY OFFICE NOTE   [name], [name]                       MRN:          [id number]  DATE:11/05/2006                            DOB:          08/30/1926    CHIEF COMPLAINT:  Followup of pancreatitis.   [name] was hospitalized again with pancreatitis.  [name] was admitted  October 07, 2006.  During that hospitalization, [name] had a CT scan of the  abdomen and pelvis, which suggested a mass in the head of the pancreas.  There was less inflammatory change compared to the CT of September 06, 2006.  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  [name] was  treated conservatively.  [name] had an MRI/MRCP which showed an enlarged and  inflamed pancreatic head without discrete mass or pseudocyst with mild  extrinsic compression of the common bile duct and a normal pancreatic  duct.  [name] had some enlarged portal celiac axis hepatoduodenal ligament  lymph nodes, which were thought to be reactive.  [name] did not have any  gall stones.  Previously, we had thought his pancreatitis could have  come from Vytorin.  [name] noted the onset of epigastric pain radiating to  the back and the symptoms of pancreatitis after beginning Welchol a  couple of days prior to the pancreatitis.  After his pancreatitis in  October, [name] was treated with Zetia 10 mg daily, which [name] has self-  stopped recently, as [name] saw news literature indicating that it was not  that effective.  [name] did have a CA19-9 level of 84 when [name] was  hospitalized as well.  [name] has been losing some weight.  [name] is down about  10-12 pounds over the last few months.   [name] denies any pain or fever at this time.  No bowel habit  irregularities.  No diarrhea.   PAST MEDICAL HISTORY:  Reviewed and unchanged from the history and  physical of October 07, 2006.   PHYSICAL EXAM:  Weight 192 pounds, pulse 60, blood pressure 120/48.  EYES:  Anicteric.  ABDOMEN:  Obese, soft and  nontender.  Without organomegaly or mass.   ASSESSMENT:  Recurrent pancreatitis.  [name] has had pancreatic fluid  collections, which have improved.  There is some concern about possible  malignancy causing this, we do not think it is gall stones.  The other  possibilities include the Vytorin and now the Zetia [name] was taking  actually could have triggered this as well, as that is a possible  adverse side effect, though it is not listed with Welchol in my  reference.   PLAN:  1. Repeat CA19-9, CMET, amylase, and lipase today.  2. I think an endoscopic ultrasound with potential FNA of the      pancreatic head could be in order.  Will discuss with Dr. Christella Hartigan,      our EUS physician.  3. Certainly if his CA19-9 is rising, would move that up.  We may need      to wait until the inflammation is gone as much as possible prior to  the EUS, as that could confuse things.  4. I think it is reasonable to continue to hold the Zetia, as      pancreatitis is listed as a possible      adverse event.  We will need to discuss with Dr. Debby Bud about other      lipid therapy, as the patient does have known coronary artery      disease.     [name], MD,FACG  Electronically Signed    CEG/MedQ  DD: 11/05/2006  DT: 11/05/2006  Job #: [id number]   cc:   [name], MD

## 2011-03-02 NOTE — Discharge Summary (Signed)
[name], [name]                ACCOUNT NO.:  [id number]   MEDICAL RECORD NO.:  [id number]          PATIENT TYPE:  INP   LOCATION:  6734                         FACILITY:  [facility]   PHYSICIAN:  Barbette Hair. Arlyce Dice, MD,FACGDATE OF BIRTH:  Sep 08, 1926   DATE OF ADMISSION:  10/07/2006  DATE OF DISCHARGE:  10/10/2006                               DISCHARGE SUMMARY   ADDENDUM   HOSPITAL COURSE:  1. Insulin-dependent diabetes mellitus.  CBGs were monitored and he      did have readings up into the mid200s.  Initially we were holding      the Lantus insulin but when this was restarted at his usual dose he      did have lower blood sugars.  Plan at discharge was to send him      home on his usual dose of metoprolol and 70/30 insulin.  2. Mild elevation of CA 19-9.  This CA 19-9 was not astoundingly      elevated and elevations of CA 19-9 can be seen in the setting of      pancreatitis and is not necessarily indicative of occult pancreatic      or biliary cancer.  3. Patient was discharged to home in stable and improved condition.   MEDICATIONS AT DISCHARGE:  Included:  1. Doxazosin 4 mg once daily.  2. Metoprolol 50 mg 1/2 p.o. twice daily.  3. Enteric coated aspirin 325 mg daily.  4. Humalog insulin 70/30.  General dose is 20 in the morning and 10 in      the evening but he does have a sliding scale that he follows and he      sometimes does not need to take the evening dose.  5. Multivitamin once daily.  6. Plavix 75 mg once daily.  7. Actos 30 mg once daily.  8. WelChol is to be discontinued.   FOLLOWUP:  With Dr. Stan Head on January 22 at 11:30 a.m.   DIET:  At discharge is diabetic diet which should include fat  restrictions.   ACTIVITY:  There are no restrictions to activity for this patient.      Jennye Moccasin, PA-C      Barbette Hair. Arlyce Dice, MD,FACG  Electronically Signed    SG/MEDQ  D:  11/18/2006  T:  11/19/2006  Job:  [id number]   cc:   [name], MD  Ardeth Sportsman, MD  [name], MD,FACG

## 2011-03-02 NOTE — Assessment & Plan Note (Signed)
[facility]                               LIPID CLINIC NOTE   [name], [name]                       MRN:          [id number]  DATE:10/03/2006                            DOB:          April 15, 1926    [name] is seen in the Lipid Clinic for evaluation, medication  titration associated with his hyperlipidemia in the setting of  pancreatitis possibly associated with Vytorin.  The patient describes  and the chart notes describe a recent bout of pancreatitis believed to  be due to the use of Vytorin.  The patient required hospitalization and  gut rest.  He does have a past medical history associated with coronary  artery disease for which he sees Dr. [name] routinely.  He was  diagnosed with diabetes in 1985.  He had coronary artery bypass grafting  in 1997.  The patient is feeling much better at this point in time.  He  has had improved pain management, and return to a more normal diet over  the past several weeks.  As a result of the patient's hospitalization  and evaluation by GI, it was suggested that the patient discontinue all  statin drugs until further evaluation by the GI team could be completed.  As a result, in conversation with Dr. Illene Regulus, we plan to attempt  to gain control of [name] cholesterol through a different mechanism  and class of medications.  Blockage of absorption would be the best  route to obtain the most significant lowering of LDL without a statin,  and thus, as discussed with Dr. Debby Bud, we have tentatively planned for  the patient to take Zetia plus or minus Welchol.   PAST MEDICAL HISTORY:  As noted above.   CURRENT MEDICATIONS:  Include:  1. Metoprolol 25 mg twice daily.  2. Ecotrin 325 mg daily.  3. Multivitamin daily.  4. Clopidogrel 75 mg daily.  5. Advair has been discontinued.  6. Humalog 75/25 twenty units twice daily.  7. Doxazosin 4 mg daily.  8. Actos 30 mg daily.  9. Zetia 10 mg p.o.  daily.   REVIEW OF SYSTEMS:  As stated in the HPI and otherwise negative.   PHYSICAL EXAM:  Weight today in the office 195 pounds.  Blood pressure  is 120/74.  Heart rate is 60.  Respirations are 16.   Most recent labs collected in the hospital revealed, on December 18, a  lipid profile with total cholesterol 166, triglyceride 91, HDL  cholesterol 37.7, LDL cholesterol 110.  Hepatitic function panels on  this day were completely normal, within normal limits.   ASSESSMENT:  The patient has documented coronary disease and LDL goal of  less than 70 to 100.  He cannot use statins at this time, however,  because of his probable pancreatitis induced by Vytorin.  I have  discussed with the patient at length that Zetia and/or Zocor may be our  culprit.  Therefore, I have asked the patient to begin Welchol 3 tablets  p.o. twice daily.  Mechanism of action of this drug, potential side  effects, and problems have been discussed with him.  Samples have been  obtained for the patient as well.  He will begin this medication and  follow up in 4 weeks with laboratories.  He will call with any belly  pain or other issues associated with possible pancreatitis.  Further, he  will follow up as scheduled with GI.   I appreciate the opportunity to meet with this pleasant patient.   ADDENDUM:  I was contacted by the patient's wife on December 27 that he  had developed abdominal pain after his first dose of Welchol.  This  worsened over the next 3 days and the patient was admitted to the  hospital on December 24.  As a result, he will take no cholesterol  medicines at this time and will follow up with GI in January.  The  patient's labs were reviewed at the time of this admission and they  demonstrated an elevation in the AST and ALT as well as lipase slightly  elevated at 95.  It is of note that the ALT was 3.5 approximate times  the upper limit of normal and AST was approximately 3 times the upper   limit normal.  As it is down significantly from the labs on admission on  the 24th, the patient will be followed up by GI and I will continue to  follow along from afar.      Shelby Dubin, PharmD, BCPS, CPP  Electronically Signed      Rollene Rotunda, MD, [facility]  Electronically Signed   MP/MedQ  DD: 10/29/2006  DT: 10/30/2006  Job #: [id number]

## 2011-03-02 NOTE — Discharge Summary (Signed)
NAMEANDONI, [name]                ACCOUNT NO.:  [id number]   MEDICAL RECORD NO.:  [id number]          PATIENT TYPE:  INP   LOCATION:  6734                         FACILITY:  [facility]   PHYSICIAN:  Barbette Hair. Arlyce Dice, MD,FACGDATE OF BIRTH:  04-21-1926   DATE OF ADMISSION:  10/07/2006  DATE OF DISCHARGE:  10/10/2006                               DISCHARGE SUMMARY   ADMITTING DIAGNOSES:  1. Recurrent pancreatitis, rule out secondary to lipid-lowering      agents, rule out underlying nonobstructive pancreatic malignancy or      biliary malignancy, rule out sludge or stones in the gallbladder.  2. Insulin-dependent, type-2-diabetes mellitus.  3. Dyslipidemia with recent initiation of Welchol therapy.  4. History of acute pancreatitis felt secondary to Vytorin in October      2007.  5. Coronary artery disease.      a.     He is status post five-vessel cabbage in 1997.   1. Hyperlipidemia.  2. History of nasal polyps, significant epistaxis following nasal      polypectomy in 2004.  3. Hypertension.   DISCHARGE DIAGNOSES:  1. Recurrent pancreatitis.  Question whether this is secondary to      limited use of Welchol preceding this admission as Vytorin was felt      to be the culprit during his bout of pancreatitis in the fall of      2007.  No evidence for a dilated biliary tree on MRCP.  No      gallstones seen on the gallbladder imaging.  2. Insulin-dependent-diabetes mellitus.  Blood sugars elevated during      this admission.  Elevated hemoglobin A1c noted.  The patient's      blood sugars responded well to re initiation of Lantus insulin.Marland Kitchen   BRIEF HISTORY:  [name] is a pleasant 77 year old white male.  He had  pancreatitis attributed to Vytorin use in October 2007.  Ultrasound in  October did not visualize the pancreas.  A post recovery CT scan was  obtained September 06, 2006, showing inflammatory changes with a large  4.1 x 2.3 fluid collection adjacent to the superior mesenteric  vein.  Vascular anatomy in the biliary tree were unremarkable.  There tiny  liver lesions which were too small to characterize that were  incidentally noted.  Since that bout of pancreatitis, the patient has  done well.   Because of ongoing problems with dyslipidemia, the patient was  prescribed Welchol.  He took one dose of the [facility] on Thursday, that  is 3 pills for the one dose and by Friday he was feeling poorly, so he  stopped taking the Welchol.  He developed upper abdominal pain and  nausea which persisted Friday, Saturday, and Sunday.  The pain intensity  was worsening and he presented to the emergency room on October 07, 2006, for evaluation.  In the emergency room his amylase was 508, and  his lipase was 369.  Note that when he had his bout of pancreatitis in  the fall, he had a lipase at [facility] reading 26,000.  Also  here on  December 24th, his LFTs were elevated.  He was admitted for  supportive care and further workup of recurrent pancreatitis.  Given his  age, there was some worry that there may be some underlying biliary or  pancreatic malignancy triggering the pancreatitis.Marland Kitchen   LABORATORY:  Hemoglobin 13.4 on admission, 11.6 at discharge.  Hematocrit 33.5 at discharge.  Platelets ranging 158,000-207,000.  White  blood cell count 10.3.  Differential did reveal a left shift with an  increase in absolute neutrophils at 8, increased monocytes with absolute  count of 1.2, and increase in eosinophils at 6%.  Sodium 134, potassium  4.5, glucose ranging 175-209, BUN 13, creatinine 0.7, chloride 103, CO2  25.  Total protein 5.6, albumin 2.9.  Total bilirubin ranging 1.1 to  1.4.  Alkaline phosphatase 166-107, AST 425 and 94 on recheck.  AST  initially 381, 175 on recheck.  Amylase 508.  Lipase went from a high of  369 down to 95.  Hemoglobin A1c was 7.1.  CA19-9 84.7 with anything  above 35.1 being considered abnormal.   CONSULTATIONS:  [facility],  Dr. Karie Soda, M.D.   PROCEDURES:  None.   IMAGING STUDIES:  1. CT scan of the abdomen and pelvis showing persistent mass effect in      the head of pancreas with less inflammatory change, worrisome for      pancreatic cancer.  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  That was CT scan of December 24th.  2. MRCP of December 24th, showed enlarged, inflamed pancreatic head      without discrete mass or pseudocyst.  Mild extrinsic compression of      the common bile duct but no common bile duct stone.  Pancreatic      duct of normal caliber and normal course.  Enlarged portal, celiac      axis, and hepatoduodenal ligament lymph nodes, likely inflammatory.      Simple left renal cysts.  Normal gallbladder with no gallstones.Marland Kitchen   HOSPITAL COURSE:  1. Pancreatitis.  The patient was admitted to a non-telemetry bed and      was hydrated liberally to start with, kept n.p.o.  Within 24 hours      the patient's symptoms had improved.  He had not required      significant use of available p.r.n. analgesics.  Diet was advanced      from clear liquids and within a few days he was tolerating a      carbohydrate-modified solid diet.  This did not trigger any      recurrent pain and by the day of discharge, hospital day #4, he was      pain free.  An MRCP was ordered in order to more clearly define anatomy of the  patient's biliary tree.  This did not show any worrisome findings.  There was question of mass effect at the head of the pancreas worrisome  for pancreatic CA noted on the CT scan but the MRCP did failed to  confirm any pseudocyst or mass.  A general surgery evaluation by Dr. Michaell Cowing was obtained.  He did not feel  that the patient would benefit from a laparoscopic cholecystectomy.  He  felt that the most likely reason for the recurrent pancreatitis was the  use of Vytorin, however, limited it had been.  He did feel that should the patient present again with  recurrent pancreatitis without the  trigger of use of lipid-lowering  agents, that surgery could be  reconsidered at that point, but for now surgery was not felt to be of  benefit for this patient.  1. Insulin-dependent-diabetes mellitus.  The patient's capillary blood      glucose was monitored.      Jennye Moccasin, PA-C      Barbette Hair. Arlyce Dice, MD,FACG  Electronically Signed    SG/MEDQ  D:  11/18/2006  T:  11/18/2006  Job:  [id number]   cc:   [name], MD  [name], MD,FACG

## 2011-03-02 NOTE — Discharge Summary (Signed)
[name], [name]                ACCOUNT NO.:  [id number]   MEDICAL RECORD NO.:  [id number]          PATIENT TYPE:  INP   LOCATION:  5021                         FACILITY:  [facility]   PHYSICIAN:  Raenette Rover. Felicity Coyer, MDDATE OF BIRTH:  February 02, 1926   DATE OF ADMISSION:  07/31/2006  DATE OF DISCHARGE:  08/06/2006                                 DISCHARGE SUMMARY   DISCHARGE DIAGNOSES:  1. Acute pancreatitis, likely secondary to Vytorin.  2. Dyslipidemia.   HISTORY OF PRESENT ILLNESS:  [name] is a 77 year old white male who was  admitted on July 31, 2006 and transferred from [facility], where  he presented with acute abdominal pain which had started on the morning of  admission.  CT of the abdomen, which was performed at [facility], was  consistent with focal/early pancreatitis.  The patient was admitted for  further evaluation and treatment.   PAST MEDICAL HISTORY:  1. Diabetes type 2.  2. Dyslipidemia.  3. History of severe epistaxis with cauterization and packing in 2004      following polypectomy.  4. Coronary artery disease, status post CABG x5 in 1997.  5. Hernia repair in 1950s.  6. Right rotator cuff repair in 2003.  7. Sinus polypectomy in 2005.   COURSE OF HOSPITALIZATION:  1. Acute pancreatitis, likely secondary to Vytorin.  The patient was      admitted and was noted to have a lipase of 26,140 and an amylase of      1582, per [facility] labs.  A GI consult was obtained and an      abdominal ultrasound was performed.  Ultrasound showed that the      pancreas was obscured by bowel gas.  He was noted to have a fever of      101.3 and he was given IV hydration.  Blood cultures x2 were sent on      admission, which at the time of this dictation, remained no growth to      date.  KUB performed on October 20th showed a nonobstructive bowel gas      pattern.  Chest x-ray showed cardiomegaly and mild edema.  The patient      was seen in consultation  by Dr. Leone Payor and it was felt that the      patient's pancreatitis was likely secondary to Vytorin, which was held      throughout this hospitalization and will continue to be held upon      discharge.  He is currently tolerating p.o.   Also of note, the patient did develop an episode of hiccups, which lasted  approximately 24 hours.  He was given a dose of Phenergan; however, this  caused confusion, therefore no further doses were given.  He is currently at  his mental status baseline.   MEDICATIONS ON DISCHARGE:  1. The patient is instructed not to take Vytorin.  2. Fexofenadine 180 mg p.o. daily.  3. Metoprolol 25 mg p.o. b.i.d.  4. Avandia 4 mg p.o. daily.  5. Singulair 10 mg p.o. daily.  6. Doxazosin 4 mg p.o.  daily.  7. Clopidogrel 75 mg p.o. daily.  8. Ecotrin 81 mg p.o. daily.  9. Humalog Mix 75/25; the patient is to resume the same dosing as prior to      admission.   PERTINENT LABORATORIES ON DISCHARGE:  Hemoglobin 12.3, hematocrit 35.4,  amylase 30, lipase 17, AST 56, ALT 33.   DISPOSITION:  Plan to transfer patient to home.   CONDITION ON DISCHARGE:  Medically improved.   FOLLOWUP APPOINTMENTS:  The patient is to follow up with [name] of  [facility] on November 15 at 1:30 p.m.  He is also instructed to follow up  with [name] on October 26 at 1:50 p.m.  He is instructed to call  [name] or go to the ER should he develop worsening abdominal pain,  nausea, vomiting, weakness, or fever over 101.      Ryan Craze, NP      Raenette Rover. Felicity Coyer, MD  Electronically Signed    MO/MEDQ  D:  08/06/2006  T:  08/06/2006  Job:  [id number]   cc:   Ryan Boop, MD,FACG  Ryan Gess. Norins, MD

## 2011-03-02 NOTE — Consult Note (Signed)
[name], [name] NO.:  [id number]   MEDICAL RECORD NO.:  [id number]          PATIENT TYPE:  INP   LOCATION:  4713                         FACILITY:  [facility]   PHYSICIAN:  [name], MD,FACGDATE OF BIRTH:  1926/08/09   DATE OF CONSULTATION:  DATE OF DISCHARGE:                                   CONSULTATION   HISTORY OF PRESENT ILLNESS:  [name] is a 77 year old man whom I saw with  [name] with the residency service for GI consultation.  He turned out to  have acute pancreatitis.  The etiology was not clear.  CT scanning,  abdominal ultrasound, and triglycerides were all unrevealing.  It might be  his Vytorin.  He is improving at this point and may be getting ready for  discharge, though there is a fever. I am signing off the case now and will  turn it over to my colleagues.  I plan to have him come back to see me in  the office in about a month, on November 15th.  His regular  gastroenterologist has been [name] for colonoscopy.  I will see him  back once since I know his hospital course and arrange repeat imaging of the  pancreas once the inflammation subsides to rule out cancer, which is a  possible cause of this problem.  There was no mass, lesion seen on his  imaging here.      [name], MD,FACG  Electronically Signed     CEG/MEDQ  D:  08/02/2006  T:  08/03/2006  Job:  [id number]   cc:   [name], MD, [name], FAGA  [name], MD  [name], M.D.

## 2011-03-02 NOTE — Assessment & Plan Note (Signed)
[facility]                           GASTROENTEROLOGY OFFICE NOTE   RC, AMISON                       MRN:          [id number]  DATE:09/04/2006                            DOB:          07/02/1926    CHIEF COMPLAINT:  Followup of pancreatitis.   [name] had pancreatitis which we think was due to Vytorin. He feels fully  recovered at this time. He has no complaints today. Recheck of his amylase  and lipase in October by Dr. Debby Newman was normal. Weight 194 pounds, pulse 62,  blood pressure 110/60.   ASSESSMENT:  Recent acute pancreatitis. He had focal changes of pancreatitis  on a CT scan in [place], this was in the area of the head.   PLAN:  CT with pancreas protocol to recheck this area to check for  resolution. Rule out mass lesion.     Ryan Boop, MD,FACG  Electronically Signed    CEG/MedQ  DD: 09/04/2006  DT: 09/04/2006  Job #: [id number]
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
## 2011-03-02 NOTE — Op Note (Signed)
[facility]  Patient:    [name], [name] Visit Number: [id number] MRN: [id number]          Service Type: SUR Location: 4W [id number] 01 Attending Physician:  Marlowe Kays Page Dictated by:   Illene Labrador. Aplington, M.D. Proc. Date: 02/24/02 Admit Date:  02/24/2002                             Operative Report  PREOPERATIVE DIAGNOSIS:  Torn rotator cuff, right shoulder.  POSTOPERATIVE DIAGNOSIS:  Torn rotator cuff, right shoulder.  OPERATION:  Anterior acromionectomy and repair of torn rotator cuff, right shoulder.  SURGEON:  Illene Labrador. Aplington, M.D.  ASSISTANT:  Alexzandrew L. Perkins, P.A.-C.  ANESTHESIA:  General.  PATHOLOGY/JUSTICATION FOR PROCEDURE:  The patient has had pain, inability to abduct his right arm for several months and MRI demonstrated large retracted rotator cuff tear. These findings were confirmed at surgery with a 2-cm wide, 3-cm retracted full thickness rotator cuff tear.  DESCRIPTION OF PROCEDURE:  Prophylactic antibiotics, a beach-chair position on the Schlein frame, right shoulder was prepped with DuraPrep and draped in a sterile field. Ioban employed. Vertical incision from roughly the [facility] joint down to the greater tuberosity. The fascia over the anterior acromion was opened in line with the skin incision and dissected anteriorly off the anterior acromion with the cutting cautery. I then identified the [facility] joint with a Keith needle, undermined the anterior acromion with a Cobb elevator. After marking out my initial line of anterior acromionectomy, I made the cut with a micro saw and then had to perform a good bit of additional inferior acromioplasty to relieve the impingement problem. The rotator cuff tear was easily identifiable with a large triangular-shaped tear. A good bit of subdeltoid bursa was excised and after finding that the tear was reasonably flexible, I reapproximated the apex with multiple interrupted #1  Ethibond sutures and then used them for traction. I made a bony trough at the greater tuberosity with a combination of curved osteotome and rongeur and then made four holes through the bone and used three #1 Ethibond horizontal sutures supplemented by a Super Mitek anchor and with the arm abducted, was able to reapproximate the tendon nicely into the trough I had created, with the repair stable with the arm at the side. I supplemented this with one #1 Vicryl suture to smooth down the distal repair. I then checked for impingement and there was no residual impingement. The wound was irrigated with sterile saline and the soft tissues infiltrated with 0.5% Marcaine with adrenaline. The wound was then closed with one layer of #1 Vicryl in the muscle and fascia over the anterior acromion with a good solid repair of 2-0 Vicryl subcutaneous tissues and Steri-Strips on the skin. Dry sterile dressing and shoulder immobilizer were applied. He tolerated the procedure well and was taken to recovery in satisfactory condition with no known complications. Dictated by:   Illene Labrador. Aplington, M.D. Attending Physician:  Joaquin Courts DD:  02/24/02 TD:  02/25/02 Job: [id number]

## 2011-03-02 NOTE — Consult Note (Signed)
[name]                ACCOUNT NO.:  [id number]   MEDICAL RECORD NO.:  [id number]          PATIENT TYPE:  INP   LOCATION:  6734                         FACILITY:  [facility]   PHYSICIAN:  Ryan Sportsman, MD     DATE OF BIRTH:  [birth year]   DATE OF CONSULTATION:  DATE OF DISCHARGE:                                 CONSULTATION   PRIMARY CARE PHYSICIAN:  [name], M.D.   GASTROENTEROLOGIST:  [name], MD,FACG.   SURGEON:  [name], M.D.   REASON FOR CONSULTATION:  Recurrent pancreatitis, uncertain biliary  etiology.   HISTORY OF PRESENT ILLNESS:  [name] is a 80-year male patient with no  prior history of GI complaints such as abdominal pain or any other  problems, etcetera until October2007, when he was admitted in [facility]  for a diagnosis of acute pancreatitis felt to be secondary to use of  Vytorin.  At that time, his lipase was markedly elevated at 26,000.  No  CT scan was done at admission in [place] but an ultrasound was done  which did not reveal much information either.  The patient eventually  followed up with Dr. Leone Payor, was evaluated by CT scan on November23rd,  that CT reveals extensive inflammatory changes at the head of the  pancreas down to the uncinate process with a complex fluid collection  measuring 4.1 x 2.3-cm adjacent to the superior mesenteric vein.  The  patient apparently was not having any type of abdominal pain at that  time.   Around October 03, 2006, the patient developed bilateral upper  abdominal pain intermittent in nature but increasing in severity and  duration and becoming more progressive.  He was admitted on October 07, 2006, with a diagnosis of acute pancreatitis.  At that time his amylase  was 508, lipase 369, total bilirubin normal at 1.1, AST 425, ALT 381.  A  CT showed decreased but persistent enlargement of the head of the  pancreas.  No biliary dilatation of the intrinsic or extrinsic biliary  tree.   Just prior to admission, the patient had initiated another dyslipidemia  medication Welchol and had taken one dosage when he says his pain began.  Since admission, the patient has undergone an MRCP on October 09, 2006,  that revealed pancreatic head inflammation, mild extrinsic compression  of the common bile duct with no evidence of choledocholithiasis.  He was  also found to have portal and celiac and hepatoduodenal lymphadenopathy.  I presume this is reactive secondary to acute pancreatitis.  There was  no pancreatic mass noted.  Because of these findings, a surgical  evaluation has been requested to see if possibly there is a biliary  etiology to the patient's recurrent pancreatitis.   REVIEW OF SYSTEMS:  The patient has had no reported history of  indigestion or postprandial abdominal pain before his episode of  pancreatitis in October.  And even during his so called well period,  between hospitalization, he has had no abdominal pain or postprandial  symptoms.  The patient reports losing 20 pounds since October2007,  because of the recurrent illnesses.   SOCIAL HISTORY:  No tobacco.  No alcohol.  He is married.   PAST MEDICAL HISTORY:  1. CAD.  2. Insulin-dependent-diabetes mellitus.  3. Dyslipidemia.  4. Hypertension.  5. Prior pancreatitis in October 2007, felt to be secondary to use of      Vytorin.   PAST SURGICAL HISTORY:  1. CABG x5 in 1997.  2. Nasal polypectomy in 2004.  3. Repair of right rotator cuff in 2003.  4. Bilateral inguinal hernia repair in the 1950s.   ALLERGIES:  NKDA.   CURRENT MEDICATIONS:  Cardura, Lopressor, aspirin, multiple vitamins,  Plavix on hold, Lantus insulin, sliding scale insulin, Vicodin p.r.n.,  IV fluids, and several other p.r.n. medications.   PHYSICAL EXAMINATION:  GENERAL:  Pleasant male patient complaining of  recent problems with severe abdominal pain secondary to pancreatitis  with abdominal pain markedly.  VITAL  SIGNS:  Temperature 99.9, BP 154/79, pulse 90 and regular,  respirations 20.  NEURO:  The patient is alert and oriented x3, moving all extremities x4  without focal neurological deficits.  HEENT:  Head normocephalic.  Sclera not injected, nonicteric.  NECK:  Supple.  No adenopathy.  CHEST:  Bilateral lung sounds clear to auscultation.  Respiratory effort  non-labored.  He is sating 94% on room air.  CARDIAC:  S1-S2, no rubs, murmurs, thrills, or gallops.  Pulses regular.  ABDOMEN:  Soft and obese.  Bowel sounds are present.  He is tender in  the left upper quadrant with palpation and there is a fullness of the  left lower quadrant that is almost mass-like but spongy.  Palpation over  the right upper quadrant reveals no right upper quadrant pain but causes  pain in the left upper quadrant.  He has bowel sounds present and  active.  EXTREMITIES:  Symmetrical in appearance without edema, cyanosis or  clubbing.   LABORATORY:  CA19-9 is elevated at 84.7.  White count 7,600, hemoglobin  11.6, platelets 158,000.  Sodium 132, potassium 3.8, CO2 25, glucose  209, BUN 7, creatinine 0.7.  AST down to 94, ALT down to 175, total  bilirubin mildly increased at 1.4, lipase 95, amylase 508.   DIAGNOSTIC STUDIES:  MRCP and CT of the abdomen and pelvis is noted.   IMPRESSION:  1. Recurrent pancreatitis, uncertain etiology.  2. Mild transaminitis without evidence of stones or any intrinsic or      extrinsic hepatic ductal dilatation or obstruction.  3. Elevated CEA 19.9, uncertain significance in the setting of normal      MRCP.  4. Hypertension.  5. Diabetes mellitus.  6. Coronary artery disease.   PLAN:  1. Etiology is still not clear-cut as to the cause of pancreatitis,      most likely culprit is dyslipidemic medicines.  Would avoid use of      these in the future to help eliminate this as cause.  2. It is somewhat concerning that the patient does have an elevated     CA19-9, despite having  normal MRCP.  We will discuss this further      with Dr. Michaell Cowing and Dr. Arlyce Dice.  3. Dr. Arlyce Dice is currently advancing the patient's diet.  4. Ultrasound and CT again showed no convincing evidence of biliary      etiology.  LFTs are mildly elevated.  Uncertain if this is related      to reaction to recent lipid medications versus to inflammatory      changes from  pancreatitis.  5. I have discussed possibility of cholecystectomy with the patient      and his wife, noting that cannot proceed until inflammatory      changes/pancreatitis have resolved, i.e., the edema and the fluid      collection.  I discussed with him that even with removal of the      gallbladder, if this is not the etiology for his pancreatitis,      there is not a guarantee that the pancreatitis would not recur.  6. Any additional recommendations per Dr. Michaell Cowing.      Allison L. Rolene Course      Ryan Sportsman, MD  Electronically Signed    ALE/MEDQ  D:  10/09/2006  T:  10/09/2006  Job:  [id number]   cc:   [name], MD  [name], MD,FACG

## 2011-03-02 NOTE — H&P (Signed)
[name], [name]                ACCOUNT NO.:  [id number]   MEDICAL RECORD NO.:  [id number]          PATIENT TYPE:  INP   LOCATION:  6734                         FACILITY:  [facility]   PHYSICIAN:  Jennye Moccasin, PA-C    DATE OF BIRTH:  03-Jun-1926   DATE OF ADMISSION:  10/07/2006  DATE OF DISCHARGE:                              HISTORY & PHYSICAL   CHIEF COMPLAINT:  Abdominal pain.   HISTORY OF PRESENT ILLNESS:  [name] is a pleasant, 77 year old white  male.  He has a history of pancreatitis that was felt secondary to  Vytorin in October of 2007.  He had initially presented to the hospital  in [place] where his lipase was 26,000.  Ultrasound at the time of the  admission here at [facility] showed an obscure pancreas, and we  did not get a CT scan here.  However, since being discharged in late  October he has had followup with Dr. Leone Payor, and a CT scan was obtained  on November 23rd, showing extensive inflammatory changes in the head of  the pancreas to the uncinate process with complicated fluid collection  of 4.1 x 2.3 cm adjacent to the super mesenteric vein.  Vascular anatomy  and the biliary tree were undisturbed, and radiology  reading was that  of evolving pancreatitis.  Note that there were several tiny, too-small-  to-characterize, low-density liver lesions also noted on that CT scan.   Patient has been doing well and pain-free at home until he had  recurrence of bilateral upper abdominal pain along with nausea, for the  last 4 days.  It was intermittent Friday, Saturday and Sunday, but as of  Sunday evening when it recurred, it has been persistent and progressive,  though the intensity scale at its worst in the last 10 hours is 6/10.  He received some Nubain and Zofran here in the emergency room, and pain  has subsided significantly but not resolved.  His lipase here is 508,  his LFTs are increased.  A CT scan has been repeated and shows overall  decreased but  persistent inflammation at the head of the pancreas.  Radiology reading is  that this is worrisome for cancer.   Note that patient was started on Welchol on Thursday and he was supposed  to take 3 p.o. b.i.d.  He took 1 dose - that is 3 pills, on Thursday.  His symptom onset was on Friday, so he stopped taking the  Welchol.  Otherwise, medications that he takes  are stable, and he has not used  Vytorin since he was hospitalized in October.   ALLERGIES:  HE HAS NO KNOWN MEDICAL ALLERGIES.   CURRENT MEDICATIONS:  Include doxazosin 4 mg once daily, Metoprolol 50  mg 1/2 p.o. b.i.d., enteric coated aspirin 325 mg a daily, Humalog  insulin 70/30 (generally he takes 20 in the morning and 10 in the  evening, though he does have a sliding scale that he follows, and  sometimes he does not need to take that p.m. dose), 1 multivitamin a  day, Plavix 75 mg a day,  Actos 30 mg a day, Welchol 625 mg 3 p.o.  b.i.d., got 1 dose in 4 days ago and no used since.   PAST MEDICAL HISTORY:  1. Acute pancreatitis felt secondary to Vytorin.  2. Coronary artery disease.  3. He is status post 56 5-vessel CABG.  4. Insulin-dependent diabetes mellitus type 2.  5. Hyperlipidemia.  6. Sinus/nasal polyps.  He had significant epistaxis requiring      cauterization and packing after nasal polypectomy in 2004.  7. Hypertension.   SOCIAL HISTORY:  Patient is married.  He lives in [place].  He is  retired.  Does not drink.  Does not smoke.   FAMILY HISTORY:  Brother died with end-stage renal disease, sister died  with bone cancer.  He is unsure of what type.  father died at age 5  from coronary artery disease.  Mother also died at age 63 from coronary  artery disease.   REVIEW OF SYSTEMS:  He has had some congested, nonpurulent cough he  attributes to allergies.  He does say he has been shaking after getting  the Nubain and Zofran earlier today, but had not had not had any chills,  fevers or sweats prior to  that.  no pedal edema.  No chest pain.  No  shortness of breath.  No bleeding problems, ENT or GI in the remote  past.  Blood sugars were 240 this morning.  Generally they are 150 or  less.  He denies prostate problems.  No nocturia.  No frequency, no  dysuria, no hematuria.  Musculoskeletal:  Does have significant  arthralgias and takes no NSAIDs.  No headaches.  No dizziness.  No limb  weakness.  No paresthesias.  All other systems are reviewed and  negative.   LABORATORIES:  Hemoglobin 13.9, hematocrit 41, white blood cell count  10.3, platelets 207,000, MVC 90.3, sodium 134, potassium 4.5, chloride  103, BUN 13, creatinine 0.7, glucose 175.  Total bilirubin 1.1, alkaline  phosphatase 166, AST 425, ALT 381.  Amylase 508, lipase 369.  CT scan  again showing decreased but persistent enlargement at the head of the  pancreas, ducts still nondilated but overall worrisome for cancer.   EXAMINATION:  Temperature 97.4.  Blood pressure 120/61.  Pulse 80.  Respiration 20.  Room air saturation 97%.  Patient is a pleasant, well-  appearing white male in no apparent distress.  HEENT exam:  Sclerae  anicteric.  Conjunctivae pink.  Oropharynx:  Mucosa is moist and clear.  Dentition is in good repair.  Neck:  No masses.  No adenopathy, no  thyromegaly, no JVD.  Pulmonary:  Chest is clear to auscultation and  percussion bilaterally.  There is no shortness of breath with cough or  deep inspection.  GI:  There is some slight tenderness bilaterally at  the upper quadrant.  No guarding, no rebound, no masses, no  hepatomegaly, no bruits.  Abdomen is mildly distended and slightly  tense, but overall  is still soft.  Extremities:  No cyanosis, clubbing  or edema.  Dorsalis pedal pulses 2+ bilaterally.  Neurologic:  Patient  has a fine upper body tremor, but no hand tremor.  Hematologic:  No  petechiae.  No bruising.  Psychiatric:  Patient is appropriate, pleasant, and provides excellent history.    ASSESSMENT:  1. Recurrent pancreatitis.  Rule out underlying nonobstructive      pancreatic malignancy or biliary malignancy.  2. Insulin-dependent diabetes mellitus.  3. Dyslipidemia with recent prescription for Welchol, of  which he took      1 dose last week.   PLAN:  1. Patient to be admitted for analgesia and supportive care.  2. Plan to check CA-99 and followup labs in the morning.  He has never      had a CA-99 in the past.  3. In so far as the analgesia is concerned, we will see if he can be      controlled with oral medications but have IV medications available      as a backup.  4. Diet:  Clear liquids.      Jennye Moccasin, PA-C     SG/MEDQ  D:  10/07/2006  T:  10/08/2006  Job:  [id number]

## 2011-03-13 ENCOUNTER — Other Ambulatory Visit: Payer: Self-pay | Admitting: *Deleted

## 2011-03-13 MED ORDER — FLUTICASONE-SALMETEROL 100-50 MCG/DOSE IN AEPB: 1.0000 | INHALATION_SPRAY | Freq: Two times a day (BID) | RESPIRATORY_TRACT | Status: DC

## 2011-03-19 ENCOUNTER — Other Ambulatory Visit: Payer: Self-pay | Admitting: *Deleted

## 2011-03-19 MED ORDER — FLUTICASONE-SALMETEROL 100-50 MCG/DOSE IN AEPB: 1.0000 | INHALATION_SPRAY | Freq: Two times a day (BID) | RESPIRATORY_TRACT | Status: DC

## 2011-05-29 ENCOUNTER — Encounter: Payer: Self-pay | Admitting: Internal Medicine

## 2011-05-29 ENCOUNTER — Ambulatory Visit (INDEPENDENT_AMBULATORY_CARE_PROVIDER_SITE_OTHER): Payer: Medicare Other | Admitting: Internal Medicine

## 2011-05-29 VITALS — BP 110/58 | HR 72 | Temp 99.0°F | Resp 16 | Wt 197.0 lb

## 2011-05-29 DIAGNOSIS — K439 Ventral hernia without obstruction or gangrene: Secondary | ICD-10-CM

## 2011-05-31 NOTE — Progress Notes (Signed)
  Subjective:    Patient ID: [name], male    DOB: 1925-11-08, 77 y.o.   MRN: [id number]  HPI Mr. pratte presents with a complaint of pain at the site of a previous hernia repair left lower abdomen. The discomfort is worse with any strain. He has not had an irreducible bulge. He has had no fever, chills or N/V. He had a mesh repair of hernia 40-45 years ago.  PMH, FamHx and SocHx reviewed for any changes and relevance.    Review of Systems System review is negative for any constitutional, cardiac, pulmonary, GI or neuro symptoms or complaints     Objective:   Physical Exam Vitals noted Gen'l WNWD whie man in no distress Abdomen- in the standing position there is a finger tip sized abdominal wall defect that is tender LLQ at the site of previous repair. No irreducible bulge.       Assessment & Plan:  Abdominal wall hernia - because this is a small rent with tenderness there is a concern for potential incarceration.  Plan - refer to general surgery.

## 2011-06-15 ENCOUNTER — Ambulatory Visit (INDEPENDENT_AMBULATORY_CARE_PROVIDER_SITE_OTHER): Payer: PRIVATE HEALTH INSURANCE | Admitting: General Surgery

## 2011-06-15 ENCOUNTER — Encounter (INDEPENDENT_AMBULATORY_CARE_PROVIDER_SITE_OTHER): Payer: Self-pay | Admitting: General Surgery

## 2011-06-15 ENCOUNTER — Ambulatory Visit (INDEPENDENT_AMBULATORY_CARE_PROVIDER_SITE_OTHER): Payer: Medicare Other | Admitting: General Surgery

## 2011-06-15 VITALS — BP 128/68 | HR 60 | Temp 96.8°F | Ht 68.0 in | Wt 200.0 lb

## 2011-06-15 DIAGNOSIS — K4091 Unilateral inguinal hernia, without obstruction or gangrene, recurrent: Secondary | ICD-10-CM

## 2011-06-15 NOTE — Progress Notes (Signed)
Chief Complaint  Patient presents with  . Other    new pt- eval of abdominal hernia     HPI [name]. is a 77 y.o. male.  This patient was referred by Dr. Debby Bud for evaluation of a left inguinal hernia. He has a history of bilateral inguinal hernia repair approximately 40-50 years ago and was recently seen by his physician for evaluation of left upper quadrant tenderness to palpation. At that time, he was found to have an inguinal hernia on exam and was referred for surgical evaluation. The patient denies any pain in the groin and denies any bulges. He states that his bowels are normally loose since his gallbladder surgery but he has had a current colonoscopy which he states was okay. He denies any nausea or vomiting or other obstructive symptoms. HPI  Past Medical History  Diagnosis Date  . Allergy   . Asthma   . CAD (coronary artery disease)   . Diabetes mellitus   . Hyperlipidemia   . BPH (benign prostatic hyperplasia)   . Colonic polyp   . History of pancreatitis   . Hearing loss   . Myocardial infarction 1997    Past Surgical History  Procedure Date  . Coronary artery bypass graft   . Right shoulder repair   . Arterial bypass surgry 1997    5 bypasses   . Hernia repair 1960  . Nose surgery 2007    History reviewed. No pertinent family history.  Social History History  Substance Use Topics  . Smoking status: Former Smoker    Types: Cigars  . Smokeless tobacco: Not on file  . Alcohol Use: 4.2 oz/week    7 Glasses of wine per week    Allergies  Allergen Reactions  . Colesevelam   . Ezetimibe-Simvastatin     Current Outpatient Prescriptions  Medication Sig Dispense Refill  . aspirin 325 MG EC tablet Take 81 mg by mouth daily.       . clopidogrel (PLAVIX) 75 MG tablet Take 75 mg by mouth daily.        Marland Kitchen doxazosin (CARDURA) 4 MG tablet Take 4 mg by mouth at bedtime.        . finasteride (PROSCAR) 5 MG tablet Take 5 mg by mouth daily.        .  Fluticasone-Salmeterol (ADVAIR DISKUS) 100-50 MCG/DOSE AEPB Inhale 1 puff into the lungs 2 (two) times daily.  180 each  3  . insulin glargine (LANTUS SOLOSTAR) 100 UNIT/ML injection Inject 25 Units into the skin at bedtime.  15 mL  0  . insulin lispro protamine-insulin lispro (HUMALOG 50/50) (50-50) 100 UNIT/ML SUSP Inject into the skin 2 (two) times daily before a meal.        . metoprolol (TOPROL-XL) 50 MG 24 hr tablet Take 50 mg by mouth daily. Take 1/2 bid        . Multiple Vitamin (MULTIVITAMIN) tablet Take 1 tablet by mouth daily.        . pioglitazone (ACTOS) 30 MG tablet Take 30 mg by mouth daily.        . rosuvastatin (CRESTOR) 10 MG tablet Take 10 mg by mouth daily.          Review of Systems Review of Systems  HENT: Positive for hearing loss.   All other systems reviewed and are negative.    Blood pressure 128/68, pulse 60, temperature 96.8 F (36 C), height 5\' 8"  (1.727 m), weight 200 lb (90.719 kg).  Physical Exam Physical Exam  Vitals reviewed. Constitutional: He is oriented to person, place, and time. He appears well-developed and well-nourished. No distress.  HENT:  Head: Normocephalic and atraumatic.  Eyes: Conjunctivae are normal. Pupils are equal, round, and reactive to light. Right eye exhibits no discharge. Left eye exhibits no discharge. No scleral icterus.  Neck: Normal range of motion. Neck supple. No tracheal deviation present.  Cardiovascular: Normal rate, regular rhythm and normal heart sounds.   Pulmonary/Chest: Effort normal and breath sounds normal. No stridor. No respiratory distress. He has no wheezes.  Abdominal: Soft. Bowel sounds are normal. He exhibits no distension and no mass. There is no tenderness. There is no rebound and no guarding.       Bilateral inguinal scars consistent with prior hernia repairs. I think he does have a small, reducible left inguinal hernia on exam with Valsalva. I do not appreciate any recurrent hernia on the right. Exam  was nontender.  Musculoskeletal: Normal range of motion. He exhibits no edema.  Neurological: He is alert and oriented to person, place, and time.  Skin: Skin is warm and dry. No rash noted. He is not diaphoretic. No erythema. No pallor.  Psychiatric: He has a normal mood and affect. His behavior is normal. Judgment and thought content normal.    Data Reviewed   Assessment/Plan    Recurrent left inguinal hernia, small and asymptomatic. I discussed with him again the natural history of inguinal hernias. I explained that this will not fix itself and will likely increase in size over time. However, since it is not limiting him or causing any discomfort, there is no absolute indication to repair this at this time. I explained the risk for bowel incarceration and strangulation and he expressed understanding. Given that this is small and the recurrent situation as well as his age and asymptomatic nature of the hernia, I think it is very reasonable to follow with watchful waiting and repair if symptoms develop. I gave him the contact information and he agreed to call back if any symptoms develop or if he changes his mind and would like to have surgical repair.               Lodema Pilot DAVID 06/15/2011, 1:16 PM

## 2011-07-11 ENCOUNTER — Other Ambulatory Visit: Payer: Self-pay | Admitting: *Deleted

## 2011-07-11 MED ORDER — MOMETASONE FUROATE 50 MCG/ACT NA SUSP: 2.0000 | Freq: Every day | NASAL | Status: DC

## 2011-08-02 LAB — COMPREHENSIVE METABOLIC PANEL
ALT: 15
AST: 17
Albumin: 2.4 — ABNORMAL LOW
Chloride: 103
Creatinine, Ser: 0.69
GFR calc Af Amer: >60
Potassium: 3.5
Sodium: 138
Total Bilirubin: 0.6

## 2011-08-02 LAB — DIFFERENTIAL
Eosinophils Relative: 5
Lymphocytes Relative: 12
Lymphs Abs: 1.2
Monocytes Absolute: 1.1 — ABNORMAL HIGH
Monocytes Relative: 11
Neutro Abs: 6.9

## 2011-08-02 LAB — CBC
MCV: 91
Platelets: 401 — ABNORMAL HIGH
RBC: 3.47 — ABNORMAL LOW
WBC: 9.7

## 2011-08-02 LAB — WOUND CULTURE

## 2011-08-31 ENCOUNTER — Encounter: Payer: Self-pay | Admitting: Internal Medicine

## 2011-09-17 ENCOUNTER — Other Ambulatory Visit: Payer: Self-pay | Admitting: *Deleted

## 2011-09-17 MED ORDER — ROSUVASTATIN CALCIUM 10 MG PO TABS: 10.0000 mg | ORAL_TABLET | Freq: Every day | ORAL | Status: DC

## 2011-09-25 ENCOUNTER — Encounter: Payer: Self-pay | Admitting: Internal Medicine

## 2011-09-26 ENCOUNTER — Ambulatory Visit: Payer: Medicare Other | Admitting: Internal Medicine

## 2011-09-27 ENCOUNTER — Ambulatory Visit (INDEPENDENT_AMBULATORY_CARE_PROVIDER_SITE_OTHER): Payer: Medicare Other | Admitting: Internal Medicine

## 2011-09-27 VITALS — BP 102/52 | HR 63 | Temp 97.6°F | Wt 196.0 lb

## 2011-09-27 DIAGNOSIS — E785 Hyperlipidemia, unspecified: Secondary | ICD-10-CM

## 2011-09-28 NOTE — Progress Notes (Signed)
  Subjective:    Patient ID: Ryan Baas., male    DOB: [birth year], 77 y.o.   MRN: [id number]  HPI Patient brought in in error: Rx was sent to wrong pharmacy - old Rx had expired and he was directed to the doctor.   Rx resolved. No visit/no charge   Review of Systems     Objective:   Physical Exam        Assessment & Plan:

## 2011-09-28 NOTE — Assessment & Plan Note (Signed)
Rx error resolved. Correct Rx to correct pharmacy

## 2011-10-01 ENCOUNTER — Other Ambulatory Visit: Payer: Self-pay | Admitting: *Deleted

## 2011-10-01 MED ORDER — ROSUVASTATIN CALCIUM 10 MG PO TABS: 10.0000 mg | ORAL_TABLET | Freq: Every day | ORAL | Status: DC

## 2011-10-24 ENCOUNTER — Ambulatory Visit (INDEPENDENT_AMBULATORY_CARE_PROVIDER_SITE_OTHER): Payer: Medicare Other | Admitting: Internal Medicine

## 2011-10-24 VITALS — BP 130/72 | HR 60 | Temp 98.1°F | Wt 197.0 lb

## 2011-10-24 DIAGNOSIS — M940 Chondrocostal junction syndrome [Tietze]: Secondary | ICD-10-CM

## 2011-10-24 NOTE — Progress Notes (Signed)
  Subjective:    Patient ID: [name]., male    DOB: 06-24-1926, 78 y.o.   MRN: [id number]  HPI [name] presents for left several weeks of left side chest wall pain but this has gotten worse over the past 2-3 days. He has no associate symptoms to suggest any cardiac involvement. NO SOB, diaphoresis. He does not recall any injury or precipitating event.   I have reviewed the patient's medical history in detail and updated the computerized patient record.    Review of Systems System review is negative for any constitutional, cardiac, pulmonary, GI or neuro symptoms or complaints other than as described in the HPI.     Objective:   Physical Exam Filed Vitals:   10/24/11 1135  BP: 130/72  Pulse: 60  Temp: 98.1 F (36.7 C)   Gen'l - WNWD white man in no distress HEETN- nl Pulm - normal respirations Cor - 2+ radial pulse, RRR Chest wall - tender to palpation at the costo-sternal junction left R7-8 and tender in the intercostal space.        Assessment & Plan:

## 2011-10-24 NOTE — Patient Instructions (Addendum)
Non-cardiac chest wall pain - costochondritis. Usually we don't know why it happens. It is not serious or related in any way to the heart.  Plan - lineament of choice           Heat           OK to take advil or aleve, etc.    Costochondritis Costochondritis (Tietze syndrome), or costochondral separation, is a swelling and irritation (inflammation) of the tissue (cartilage) that connects your ribs with your breastbone (sternum). It may occur on its own (spontaneously), through damage caused by an accident (trauma), or simply from coughing or minor exercise. It may take up to 6 weeks to get better and longer if you are unable to be conservative in your activities. HOME CARE INSTRUCTIONS    Avoid exhausting physical activity. Try not to strain your ribs during normal activity. This would include any activities using chest, belly (abdominal), and side muscles, especially if heavy weights are used.     Use ice for 15 to 20 minutes per hour while awake for the first 2 days. Place the ice in a plastic bag, and place a towel between the bag of ice and your skin.     Only take over-the-counter or prescription medicines for pain, discomfort, or fever as directed by your caregiver.  SEEK IMMEDIATE MEDICAL CARE IF:    Your pain increases or you are very uncomfortable.     You have a fever.     You develop difficulty with your breathing.     You cough up blood.     You develop worse chest pains, shortness of breath, sweating, or vomiting.     You develop new, unexplained problems (symptoms).  MAKE SURE YOU:    Understand these instructions.     Will watch your condition.     Will get help right away if you are not doing well or get worse.  Document Released: 07/11/2005 Document Revised: 06/13/2011 Document Reviewed: 05/19/2008 [facility] Patient Information 2012 [place], [place].

## 2011-10-25 ENCOUNTER — Ambulatory Visit: Payer: Medicare Other | Admitting: Internal Medicine

## 2011-11-05 ENCOUNTER — Other Ambulatory Visit: Payer: Self-pay

## 2011-11-05 MED ORDER — MOMETASONE FUROATE 50 MCG/ACT NA SUSP: 2.0000 | Freq: Every day | NASAL | Status: DC

## 2011-12-03 ENCOUNTER — Other Ambulatory Visit: Payer: Self-pay | Admitting: Internal Medicine

## 2012-02-26 ENCOUNTER — Other Ambulatory Visit: Payer: Self-pay | Admitting: Internal Medicine

## 2012-03-07 ENCOUNTER — Other Ambulatory Visit: Payer: Self-pay | Admitting: Internal Medicine

## 2012-03-21 ENCOUNTER — Other Ambulatory Visit: Payer: Self-pay | Admitting: Internal Medicine

## 2012-08-07 ENCOUNTER — Other Ambulatory Visit: Payer: Self-pay | Admitting: Internal Medicine

## 2012-08-20 ENCOUNTER — Other Ambulatory Visit: Payer: Self-pay

## 2012-08-20 MED ORDER — PIOGLITAZONE HCL 30 MG PO TABS: 30.0000 mg | ORAL_TABLET | Freq: Every day | ORAL | Status: DC

## 2012-09-14 HISTORY — PX: CORONARY ANGIOPLASTY WITH STENT PLACEMENT: SHX49

## 2012-09-15 ENCOUNTER — Inpatient Hospital Stay (HOSPITAL_COMMUNITY)
Admission: AD | Admit: 2012-09-15 | Discharge: 2012-09-18 | DRG: 247 | Disposition: A | Discharge status: Home / Self Care | Payer: Medicare Other | Source: Other Acute Inpatient Hospital | Attending: Cardiovascular Disease | Admitting: Cardiovascular Disease

## 2012-09-15 ENCOUNTER — Encounter (HOSPITAL_COMMUNITY): Payer: Self-pay | Admitting: Cardiology

## 2012-09-15 DIAGNOSIS — I2589 Other forms of chronic ischemic heart disease: Secondary | ICD-10-CM | POA: Diagnosis present

## 2012-09-15 DIAGNOSIS — Z955 Presence of coronary angioplasty implant and graft: Secondary | ICD-10-CM

## 2012-09-15 DIAGNOSIS — Z7902 Long term (current) use of antithrombotics/antiplatelets: Secondary | ICD-10-CM

## 2012-09-15 DIAGNOSIS — I2510 Atherosclerotic heart disease of native coronary artery without angina pectoris: Secondary | ICD-10-CM | POA: Diagnosis present

## 2012-09-15 DIAGNOSIS — Z951 Presence of aortocoronary bypass graft: Secondary | ICD-10-CM | POA: Diagnosis present

## 2012-09-15 DIAGNOSIS — Z95820 Peripheral vascular angioplasty status with implants and grafts: Secondary | ICD-10-CM

## 2012-09-15 DIAGNOSIS — I5042 Chronic combined systolic (congestive) and diastolic (congestive) heart failure: Secondary | ICD-10-CM

## 2012-09-15 DIAGNOSIS — Z23 Encounter for immunization: Secondary | ICD-10-CM

## 2012-09-15 DIAGNOSIS — Z7982 Long term (current) use of aspirin: Secondary | ICD-10-CM

## 2012-09-15 DIAGNOSIS — Z794 Long term (current) use of insulin: Secondary | ICD-10-CM

## 2012-09-15 DIAGNOSIS — I255 Ischemic cardiomyopathy: Secondary | ICD-10-CM | POA: Diagnosis present

## 2012-09-15 DIAGNOSIS — I25810 Atherosclerosis of coronary artery bypass graft(s) without angina pectoris: Secondary | ICD-10-CM | POA: Diagnosis present

## 2012-09-15 DIAGNOSIS — I5023 Acute on chronic systolic (congestive) heart failure: Principal | ICD-10-CM | POA: Diagnosis present

## 2012-09-15 DIAGNOSIS — Z87891 Personal history of nicotine dependence: Secondary | ICD-10-CM

## 2012-09-15 DIAGNOSIS — I200 Unstable angina: Secondary | ICD-10-CM | POA: Diagnosis present

## 2012-09-15 DIAGNOSIS — E785 Hyperlipidemia, unspecified: Secondary | ICD-10-CM | POA: Diagnosis present

## 2012-09-15 DIAGNOSIS — E119 Type 2 diabetes mellitus without complications: Secondary | ICD-10-CM | POA: Diagnosis present

## 2012-09-15 DIAGNOSIS — Z79899 Other long term (current) drug therapy: Secondary | ICD-10-CM

## 2012-09-15 DIAGNOSIS — I252 Old myocardial infarction: Secondary | ICD-10-CM

## 2012-09-15 DIAGNOSIS — I509 Heart failure, unspecified: Secondary | ICD-10-CM | POA: Diagnosis present

## 2012-09-15 DIAGNOSIS — N400 Enlarged prostate without lower urinary tract symptoms: Secondary | ICD-10-CM | POA: Diagnosis present

## 2012-09-15 HISTORY — DX: Chronic combined systolic (congestive) and diastolic (congestive) heart failure: I50.42

## 2012-09-15 LAB — TSH: TSH: 2.284 u[IU]/mL (ref 0.350–4.500)

## 2012-09-15 LAB — BASIC METABOLIC PANEL
BUN: 15 mg/dL (ref 6–23)
CO2: 28 mEq/L (ref 19–32)
Calcium: 9 mg/dL (ref 8.4–10.5)
GFR calc non Af Amer: 81 mL/min — ABNORMAL LOW (ref >90)
Glucose, Bld: 96 mg/dL (ref 70–99)
Sodium: 139 mEq/L (ref 135–145)

## 2012-09-15 LAB — CK TOTAL AND CKMB (NOT AT ARMC): Total CK: 162 U/L (ref 7–232)

## 2012-09-15 LAB — CBC
MCH: 31.5 pg (ref 26.0–34.0)
MCHC: 34.4 g/dL (ref 30.0–36.0)
MCV: 91.6 fL (ref 78.0–100.0)
Platelets: 199 10*3/uL (ref 150–400)
RBC: 4.06 MIL/uL — ABNORMAL LOW (ref 4.22–5.81)

## 2012-09-15 LAB — GLUCOSE, CAPILLARY: Glucose-Capillary: 96 mg/dL (ref 70–99)

## 2012-09-15 LAB — HEPARIN LEVEL (UNFRACTIONATED): Heparin Unfractionated: 0.17 IU/mL — ABNORMAL LOW (ref 0.30–0.70)

## 2012-09-15 LAB — HEMOGLOBIN A1C: Mean Plasma Glucose: 146 mg/dL — ABNORMAL HIGH (ref <117)

## 2012-09-15 LAB — MAGNESIUM: Magnesium: 2 mg/dL (ref 1.5–2.5)

## 2012-09-15 MED ORDER — HEPARIN (PORCINE) IN NACL 100-0.45 UNIT/ML-% IJ SOLN
1100.0000 [IU]/h | INTRAMUSCULAR | Status: DC
  Administered 2012-09-15: 1100 [IU]/h via INTRAVENOUS
  Filled 2012-09-15: qty 250

## 2012-09-15 MED ORDER — HEPARIN (PORCINE) IN NACL 100-0.45 UNIT/ML-% IJ SOLN
1350.0000 [IU]/h | INTRAMUSCULAR | Status: DC
  Administered 2012-09-15 – 2012-09-16 (×2): 1350 [IU]/h via INTRAVENOUS
  Filled 2012-09-15 (×2): qty 250

## 2012-09-15 MED ORDER — DIAZEPAM 2 MG PO TABS
2.0000 mg | ORAL_TABLET | ORAL | Status: AC
  Administered 2012-09-16: 2 mg via ORAL
  Filled 2012-09-15: qty 1

## 2012-09-15 MED ORDER — ASPIRIN 81 MG PO CHEW
324.0000 mg | CHEWABLE_TABLET | ORAL | Status: AC
  Administered 2012-09-16: 324 mg via ORAL
  Filled 2012-09-15: qty 4

## 2012-09-15 MED ORDER — ATORVASTATIN CALCIUM 20 MG PO TABS
20.0000 mg | ORAL_TABLET | Freq: Every day | ORAL | Status: DC
  Administered 2012-09-15 – 2012-09-17 (×3): 20 mg via ORAL
  Filled 2012-09-15 (×5): qty 1

## 2012-09-15 MED ORDER — POTASSIUM CHLORIDE CRYS ER 20 MEQ PO TBCR
20.0000 meq | EXTENDED_RELEASE_TABLET | Freq: Once | ORAL | Status: AC
  Administered 2012-09-15: 20 meq via ORAL
  Filled 2012-09-15: qty 1

## 2012-09-15 MED ORDER — NITROGLYCERIN 0.4 MG SL SUBL: 0.4000 mg | SUBLINGUAL_TABLET | SUBLINGUAL | Status: DC | PRN

## 2012-09-15 MED ORDER — ONE-DAILY MULTI VITAMINS PO TABS: 1.0000 | ORAL_TABLET | Freq: Every day | ORAL | Status: DC

## 2012-09-15 MED ORDER — HEPARIN BOLUS VIA INFUSION
2500.0000 [IU] | Freq: Once | INTRAVENOUS | Status: AC
  Administered 2012-09-15: 2500 [IU] via INTRAVENOUS
  Filled 2012-09-15: qty 2500

## 2012-09-15 MED ORDER — NITROGLYCERIN 2 % TD OINT
0.5000 [in_us] | TOPICAL_OINTMENT | Freq: Four times a day (QID) | TRANSDERMAL | Status: DC
Start: 2012-09-15 — End: 2012-09-16
  Administered 2012-09-15 – 2012-09-16 (×4): 0.5 [in_us] via TOPICAL
  Filled 2012-09-15: qty 30

## 2012-09-15 MED ORDER — ADULT MULTIVITAMIN W/MINERALS CH
1.0000 | ORAL_TABLET | Freq: Every day | ORAL | Status: DC
  Administered 2012-09-15 – 2012-09-16 (×2): 1 via ORAL
  Filled 2012-09-15 (×3): qty 1

## 2012-09-15 MED ORDER — DOXAZOSIN MESYLATE 4 MG PO TABS
4.0000 mg | ORAL_TABLET | Freq: Every day | ORAL | Status: DC
  Administered 2012-09-16: 4 mg via ORAL
  Filled 2012-09-15 (×3): qty 1

## 2012-09-15 MED ORDER — INSULIN GLARGINE 100 UNIT/ML ~~LOC~~ SOLN
25.0000 [IU] | Freq: Every day | SUBCUTANEOUS | Status: DC
Start: 2012-09-15 — End: 2012-09-15

## 2012-09-15 MED ORDER — MOMETASONE FURO-FORMOTEROL FUM 100-5 MCG/ACT IN AERO
2.0000 | INHALATION_SPRAY | Freq: Two times a day (BID) | RESPIRATORY_TRACT | Status: DC
  Administered 2012-09-15 – 2012-09-18 (×5): 2 via RESPIRATORY_TRACT
  Filled 2012-09-15: qty 8.8

## 2012-09-15 MED ORDER — FINASTERIDE 5 MG PO TABS
5.0000 mg | ORAL_TABLET | Freq: Every day | ORAL | Status: DC
  Administered 2012-09-15 – 2012-09-18 (×4): 5 mg via ORAL
  Filled 2012-09-15 (×5): qty 1

## 2012-09-15 MED ORDER — SODIUM CHLORIDE 0.9 % IJ SOLN: 3.0000 mL | Freq: Two times a day (BID) | INTRAMUSCULAR | Status: DC

## 2012-09-15 MED ORDER — ACETAMINOPHEN 325 MG PO TABS: 650.0000 mg | ORAL_TABLET | ORAL | Status: DC | PRN

## 2012-09-15 MED ORDER — ONDANSETRON HCL 4 MG/2ML IJ SOLN: 4.0000 mg | Freq: Four times a day (QID) | INTRAMUSCULAR | Status: DC | PRN

## 2012-09-15 MED ORDER — SODIUM CHLORIDE 0.9 % IV SOLN: 250.0000 mL | INTRAVENOUS | Status: DC | PRN

## 2012-09-15 MED ORDER — SODIUM CHLORIDE 0.9 % IV SOLN
INTRAVENOUS | Status: DC
  Administered 2012-09-16: 06:00:00 via INTRAVENOUS

## 2012-09-15 MED ORDER — FLUTICASONE PROPIONATE 50 MCG/ACT NA SUSP
1.0000 | Freq: Every day | NASAL | Status: DC
  Administered 2012-09-15 – 2012-09-18 (×3): 1 via NASAL
  Filled 2012-09-15 (×2): qty 16

## 2012-09-15 MED ORDER — ASPIRIN EC 325 MG PO TBEC
325.0000 mg | DELAYED_RELEASE_TABLET | Freq: Every day | ORAL | Status: DC
  Filled 2012-09-15: qty 1

## 2012-09-15 MED ORDER — SODIUM CHLORIDE 0.9 % IJ SOLN: 3.0000 mL | INTRAMUSCULAR | Status: DC | PRN

## 2012-09-15 MED ORDER — ALPRAZOLAM 0.25 MG PO TABS: 0.2500 mg | ORAL_TABLET | Freq: Two times a day (BID) | ORAL | Status: DC | PRN

## 2012-09-15 MED ORDER — DIAZEPAM 2 MG PO TABS: 2.0000 mg | ORAL_TABLET | ORAL | Status: DC

## 2012-09-15 MED ORDER — SODIUM CHLORIDE 0.9 % IV SOLN
INTRAVENOUS | Status: DC
  Administered 2012-09-15: 10 mL/h via INTRAVENOUS

## 2012-09-15 MED ORDER — CLOPIDOGREL BISULFATE 75 MG PO TABS
75.0000 mg | ORAL_TABLET | Freq: Every day | ORAL | Status: DC
  Administered 2012-09-15 – 2012-09-16 (×2): 75 mg via ORAL
  Filled 2012-09-15 (×3): qty 1

## 2012-09-15 MED ORDER — FUROSEMIDE 10 MG/ML IJ SOLN
40.0000 mg | Freq: Once | INTRAMUSCULAR | Status: AC
  Administered 2012-09-15: 40 mg via INTRAVENOUS

## 2012-09-15 MED ORDER — FUROSEMIDE 10 MG/ML IJ SOLN
INTRAMUSCULAR | Status: AC
  Filled 2012-09-15: qty 4

## 2012-09-15 MED ORDER — INFLUENZA VIRUS VACC SPLIT PF IM SUSP
0.5000 mL | INTRAMUSCULAR | Status: AC
  Administered 2012-09-16: 0.5 mL via INTRAMUSCULAR
  Filled 2012-09-15: qty 0.5

## 2012-09-15 MED ORDER — INSULIN GLARGINE 100 UNIT/ML ~~LOC~~ SOLN
25.0000 [IU] | Freq: Every day | SUBCUTANEOUS | Status: DC
  Administered 2012-09-15: 12 [IU] via SUBCUTANEOUS
  Administered 2012-09-16 – 2012-09-17 (×2): 25 [IU] via SUBCUTANEOUS

## 2012-09-15 MED ORDER — ZOLPIDEM TARTRATE 5 MG PO TABS
5.0000 mg | ORAL_TABLET | Freq: Every evening | ORAL | Status: DC | PRN
  Administered 2012-09-15: 5 mg via ORAL
  Filled 2012-09-15: qty 1

## 2012-09-15 MED ORDER — INSULIN ASPART 100 UNIT/ML ~~LOC~~ SOLN
0.0000 [IU] | Freq: Three times a day (TID) | SUBCUTANEOUS | Status: DC
  Administered 2012-09-16 – 2012-09-17 (×2): 2 [IU] via SUBCUTANEOUS
  Administered 2012-09-17: 1 [IU] via SUBCUTANEOUS
  Administered 2012-09-18: 0 [IU] via SUBCUTANEOUS

## 2012-09-15 MED ORDER — HEPARIN BOLUS VIA INFUSION
4000.0000 [IU] | Freq: Once | INTRAVENOUS | Status: AC
  Administered 2012-09-15: 4000 [IU] via INTRAVENOUS
  Filled 2012-09-15: qty 4000

## 2012-09-15 MED ORDER — METOPROLOL TARTRATE 25 MG PO TABS
25.0000 mg | ORAL_TABLET | Freq: Two times a day (BID) | ORAL | Status: DC
  Administered 2012-09-15 – 2012-09-18 (×7): 25 mg via ORAL
  Filled 2012-09-15 (×9): qty 1

## 2012-09-15 NOTE — Progress Notes (Signed)
ANTICOAGULATION CONSULT NOTE - Initial Consult  Pharmacy Consult for heparin Indication: chest pain/ACS  Allergies  Allergen Reactions  . Colesevelam   . Ezetimibe-Simvastatin     Patient Measurements: Height: 5\' 8"  (172.7 cm) Weight: 187 lb 13.3 oz (85.2 kg) IBW/kg (Calculated) : 68.4  Heparin Dosing Weight: 85 kg  Vital Signs: Temp: 97.5 F (36.4 C) (12/02 1130) Temp src: Oral (12/02 1130) BP: 120/54 mmHg (12/02 1130) Pulse Rate: 83  (12/02 1130)  Labs: Estimated Creatinine Clearance: 73 ml/min (by C-G formula based on Cr of 0.8).   Medical History: Past Medical History  Diagnosis Date  . Allergy   . Asthma   . CAD (coronary artery disease)   . Diabetes mellitus   . Hyperlipidemia   . BPH (benign prostatic hyperplasia)   . Colonic polyp   . History of pancreatitis   . Hearing loss   . Myocardial infarction 1997  . Acute CHF 09/15/2012  . Unstable angina 09/15/2012  . DM (diabetes mellitus) 09/15/2012    Assessment: 86 YOM transferred from [facility] with increasing SOB and chest pressure for about 1 week. Pro BNP elevated at [facility] and CXR showed some pulmonary edema. Troponin 0.14. To start heparin for NSTEMI vs CHF. Baseline labs ordered.  Goal of Therapy:  Heparin level 0.3-0.7 units/ml Monitor platelets by anticoagulation protocol: Yes   Plan:  1. Heparin bolus of 4000 units now 2. Start heparin drip at 1100 units/hr 3. Heparin level in 8 hours 4. Daily CBC and heparin level 5. F/u cath results, plans for anticoagulation, and s/sx of bleeding  Saladin Petrelli D. Arlenis Blaydes, PharmD Clinical Pharmacist Pager: [phone] Phone: [phone] 09/15/2012 12:26 PM

## 2012-09-15 NOTE — Progress Notes (Signed)
ANTICOAGULATION CONSULT NOTE - Follow Up Consult  Pharmacy Consult for heparin Indication: chest pain/ACS and atrial fibrillation  Allergies  Allergen Reactions  . Colesevelam     unknown  . Ezetimibe-Simvastatin     unknown    Patient Measurements: Height: 5\' 8"  (172.7 cm) Weight: 187 lb 13.3 oz (85.2 kg) IBW/kg (Calculated) : 68.4  Heparin Dosing Weight: 85 kg  Vital Signs: Temp: 98.4 F (36.9 C) (12/02 2000) Temp src: Oral (12/02 2000) BP: 116/60 mmHg (12/02 1910) Pulse Rate: 88  (12/02 2100)  Labs:  Basename 09/15/12 2052 09/15/12 1732 09/15/12 1240 09/15/12 1239  HGB -- -- 12.8* --  HCT -- -- 37.2* --  PLT -- -- 199 --  APTT -- -- 30 --  LABPROT -- -- 14.9 --  INR -- -- 1.19 --  HEPARINUNFRC 0.17* -- -- --  CREATININE -- -- 0.77 --  CKTOTAL -- -- -- 162  CKMB -- -- -- 4.2*  TROPONINI -- <0.30 -- <0.30    Estimated Creatinine Clearance: 73 ml/min (by C-G formula based on Cr of 0.77).   Medications:  Scheduled:    . aspirin  324 mg Oral Pre-Cath  . aspirin EC  325 mg Oral Daily  . atorvastatin  20 mg Oral q1800  . clopidogrel  75 mg Oral Daily  . diazepam  2 mg Oral On Call  . doxazosin  4 mg Oral QHS  . finasteride  5 mg Oral Daily  . fluticasone  1 spray Each Nare Daily  . [COMPLETED] furosemide      . [COMPLETED] furosemide  40 mg Intravenous Once  . [COMPLETED] heparin  4,000 Units Intravenous Once  . influenza  inactive virus vaccine  0.5 mL Intramuscular Tomorrow-1000  . insulin aspart  0-9 Units Subcutaneous TID WC  . insulin glargine  25 Units Subcutaneous QHS  . metoprolol  25 mg Oral BID  . mometasone-formoterol  2 puff Inhalation BID  . multivitamin with minerals  1 tablet Oral Daily  . nitroGLYCERIN  0.5 inch Topical Q6H  . [COMPLETED] potassium chloride  20 mEq Oral Once  . sodium chloride  3 mL Intravenous Q12H  . [DISCONTINUED] diazepam  2 mg Oral On Call  . [DISCONTINUED] insulin glargine  25 Units Subcutaneous QHS  .  [DISCONTINUED] multivitamin  1 tablet Oral Daily    Assessment: 78 yr old male transferred from [facility] with increasing SOB and chest pressure for 1 week, now on heparin for NSTEMI vs CHF.   Heparin level below goal.  No bleeding reported.  Goal of Therapy:  Heparin level 0.3-0.7 units/ml Monitor platelets by anticoagulation protocol: Yes   Plan:  Bolus IV Heparin 2500 units x 1, then increase heparin drip rate to 1350 units/hr.  F/u heparin level in AM.   Daily heparin level/cbc.    Wendie Simmer, PharmD, BCPS Clinical Pharmacist  Pager: [phone]

## 2012-09-15 NOTE — H&P (Signed)
[name] is an 78 y.o. male.    Cardiologist:Dr. Allyson Newman  PCP: Dr. Arthur Newman  Chief Complaint: SOB, transferred from [facility]   HPI: 40 [facility] this am with SOB, he sat in a recliner for awhile with improvement but when tried to walk, SOB returned.  No previous CHF.  He also stated he had been DOE for about a week associated with chest pressure.  No nausea or diaphoresis.  At [facility] ER his Pro BNP was elevated at 2960, CXR with pul. Edema.  He was given 40 IV Lasix, ASA 325, and NTG paste.  Transferred by ambulance to [facility].  Currently no chest pain and no SOB. EKG with mild inf. Lat ischemia.  Troponin 0.14.  History of CABG 1997 with LIMA to LAD, VG to 1st diag. Branch, OM and PDA.  No cath since CABG.  Last nuc lexiscan, 09/20/11 EF 38% , Large scar in mid distal LAD territory without reversible ischemia-low risk scan.   Last Echo 11/27/11 EF 35-45% borderline concentric LVH, LA is moderately  dilated. Moderate Mitral calcification with mild MR, RV pressure is 30-40 mmHg, mild pulmonary HTN.  He also has PVD with moderate bilateral internal carotid artery stenosis.    He has had pancreatitis in the past.     Past Medical History  Diagnosis Date  . Allergy   . Asthma   . CAD (coronary artery disease)   . Diabetes mellitus   . Hyperlipidemia   . BPH (benign prostatic hyperplasia)   . Colonic polyp   . History of pancreatitis   . Hearing loss   . Myocardial infarction 1997  . Acute CHF 09/15/2012  . Unstable angina 09/15/2012  . DM (diabetes mellitus) 09/15/2012    Past Surgical History  Procedure Date  . Coronary artery bypass graft   . Right shoulder repair   . Arterial bypass surgry 1997    5 bypasses   . Hernia repair 1960  . Nose surgery 2007  . Cardiac catheterization     History reviewed. No pertinent family history. not significant to this admit. Social History:  reports that he has quit smoking. His smoking use included  Cigars. He does not have any smokeless tobacco history on file. He reports that he drinks about 4.2 ounces of alcohol per week. He reports that he does not use illicit drugs.  Allergies:  Allergies  Allergen Reactions  . Colesevelam   . Ezetimibe-Simvastatin     Medications Prior to Admission  Medication Sig Dispense Refill  . ADVAIR DISKUS 100-50 MCG/DOSE AEPB USE ONE INHALATION INTO THE LUNGS TWO TIMES DAILY  180 each  2  . aspirin 325 MG EC tablet Take 81 mg by mouth daily.       . clopidogrel (PLAVIX) 75 MG tablet Take 75 mg by mouth daily.        Marland Kitchen doxazosin (CARDURA) 4 MG tablet Take 4 mg by mouth at bedtime.        . finasteride (PROSCAR) 5 MG tablet Take 5 mg by mouth daily.        . insulin lispro protamine-insulin lispro (HUMALOG 50/50) (50-50) 100 UNIT/ML SUSP Inject into the skin 2 (two) times daily before a meal.        . LANTUS SOLOSTAR 100 UNIT/ML injection INJECT 25 UNITS AND TITRATE UP AS DIRECTED  20 mL  1  . metoprolol (TOPROL-XL) 50 MG 24 hr tablet Take 50 mg by mouth daily. Take 1/2 bid        .  Multiple Vitamin (MULTIVITAMIN) tablet Take 1 tablet by mouth daily.        Marland Kitchen NASONEX 50 MCG/ACT nasal spray USE 2 SPRAYS INTO THE NOSE DAILY AS DIRECTED  1 Inhaler  5  . pioglitazone (ACTOS) 30 MG tablet Take 1 tablet (30 mg total) by mouth daily.  90 tablet  1  . rosuvastatin (CRESTOR) 10 MG tablet Take 1 tablet (10 mg total) by mouth daily.  90 tablet  3   PATIENT IS NO LONGER ON LISPRO INSULIN, HE ONLY TAKE LANTUS.  No results found for this or any previous visit (from the past 48 hour(s)). No results found.  ROS: General:no colds or fevers, no weight changes Skin:no rashes or ulcers HEENT:no blurred vision, no congestion CV:see HPI PUL:see HPI GI:no diarrhea constipation or melena, no indigestion, hx of pancreatitis from vytorin  GU:no hematuria, no dysuria MS:no joint pain, no claudication Neuro:no syncope, no lightheadedness Newman:+ diabetes, no thyroid  disease   Blood pressure 120/54, pulse 83, temperature 97.5 F (36.4 C), temperature source Oral, resp. rate 27, height 5\' 8"  (1.727 m), weight 85.2 kg (187 lb 13.3 oz), SpO2 97.00%. PE: General:alert and oriented, slightly hard of hearing  Skin:warm and dry brisk capillary refill HEENT:normocephalic, sclera clear, + facial symmetry Neck:supple  2+ carotid upstroke , no bruits  Heart:S1S2 RRR, soft, 1/6 systolic murmur. No gallup or rub Lungs:clear without wheezes or rales Abd:+ BS, soft, non tender Ext:no edema  Neuro:alert and oriented X 3, MAE follows commands    Assessment/Plan Principal Problem:  *Acute CHF Active Problems:  HYPERLIPIDEMIA  CORONARY ARTERY DISEASE, CABG 1997  PANCREATITIS, HX OF  Unstable angina  DM (diabetes mellitus)  PLAN: Pt has been seen and examined by Dr. Tresa Newman.  Will add IV heparin, serial ckmb with elevated troponin unsure if this is NSTEMI vs. Elevation due to CHF.  Plan for cardiac cath in AM.  Continue NTG paste for now unless pt becomes unstable.    [name],[name] 09/15/2012, 11:42 AM  Patient seen and examined. Agree with assessment and plan. Very pleasant 86 you WM who is s/p CABG x 5 in 1997. He is followed by Dr. Allyson Newman. His last echo in 11/2011 showed EF in 35 - 45% with Grade 2 diastolic dysfunction. He presents in transfer from [facility] after noting 1 week history of increased sob and episodes of chest heaviness. He has had PND and orthopnea the last 2 nights leading to his RH presentation this am. He was in CHF with BNP > 2000, and has mildly positive troponin. ECG shows mild inferolateral STT changes. Will continue to diuresis with IV Lasix. Will heparinize and treat with nitrates. Pt is almost 19 yrs s/p CABG, rec definitive repeat cardiac catheterization. Will set up for Dr. Allyson Newman tomorrow.   Lennette Bihari, MD, [facility] 09/15/2012 12:05 PM

## 2012-09-16 ENCOUNTER — Inpatient Hospital Stay (HOSPITAL_COMMUNITY): Payer: Medicare Other

## 2012-09-16 ENCOUNTER — Encounter (HOSPITAL_COMMUNITY): Payer: Self-pay | Admitting: Cardiology

## 2012-09-16 ENCOUNTER — Encounter (HOSPITAL_COMMUNITY)
Admission: AD | Disposition: A | Discharge status: Home / Self Care | Payer: Self-pay | Source: Other Acute Inpatient Hospital | Attending: Cardiovascular Disease

## 2012-09-16 DIAGNOSIS — I255 Ischemic cardiomyopathy: Secondary | ICD-10-CM | POA: Diagnosis present

## 2012-09-16 DIAGNOSIS — Z95820 Peripheral vascular angioplasty status with implants and grafts: Secondary | ICD-10-CM

## 2012-09-16 HISTORY — DX: Ischemic cardiomyopathy: I25.5

## 2012-09-16 HISTORY — PX: LEFT HEART CATHETERIZATION WITH CORONARY/GRAFT ANGIOGRAM: SHX5450

## 2012-09-16 LAB — BASIC METABOLIC PANEL
BUN: 16 mg/dL (ref 6–23)
CO2: 28 mEq/L (ref 19–32)
Chloride: 96 mEq/L (ref 96–112)
Creatinine, Ser: 0.77 mg/dL (ref 0.50–1.35)
Glucose, Bld: 139 mg/dL — ABNORMAL HIGH (ref 70–99)
Potassium: 3.9 mEq/L (ref 3.5–5.1)

## 2012-09-16 LAB — LIPID PANEL
Cholesterol: 138 mg/dL (ref 0–200)
HDL: 51 mg/dL (ref >39)
Triglycerides: 51 mg/dL (ref <150)

## 2012-09-16 LAB — HEPARIN LEVEL (UNFRACTIONATED): Heparin Unfractionated: 0.38 IU/mL (ref 0.30–0.70)

## 2012-09-16 LAB — POCT ACTIVATED CLOTTING TIME: Activated Clotting Time: 299 seconds

## 2012-09-16 LAB — CBC
Hemoglobin: 12.6 g/dL — ABNORMAL LOW (ref 13.0–17.0)
MCH: 32.2 pg (ref 26.0–34.0)
Platelets: 210 10*3/uL (ref 150–400)

## 2012-09-16 LAB — GLUCOSE, CAPILLARY: Glucose-Capillary: 170 mg/dL — ABNORMAL HIGH (ref 70–99)

## 2012-09-16 LAB — TROPONIN I: Troponin I: <0.3 ng/mL (ref <0.30)

## 2012-09-16 LAB — PRO B NATRIURETIC PEPTIDE: Pro B Natriuretic peptide (BNP): 2026 pg/mL — ABNORMAL HIGH (ref 0–450)

## 2012-09-16 SURGERY — LEFT HEART CATHETERIZATION WITH CORONARY/GRAFT ANGIOGRAM
Anesthesia: LOCAL

## 2012-09-16 MED ORDER — HEPARIN (PORCINE) IN NACL 2-0.9 UNIT/ML-% IJ SOLN
INTRAMUSCULAR | Status: AC
  Filled 2012-09-16: qty 1000

## 2012-09-16 MED ORDER — MIDAZOLAM HCL 2 MG/2ML IJ SOLN
INTRAMUSCULAR | Status: AC
  Filled 2012-09-16: qty 2

## 2012-09-16 MED ORDER — ONDANSETRON HCL 4 MG/2ML IJ SOLN: 4.0000 mg | Freq: Four times a day (QID) | INTRAMUSCULAR | Status: DC | PRN

## 2012-09-16 MED ORDER — ACETAMINOPHEN 325 MG PO TABS: 650.0000 mg | ORAL_TABLET | ORAL | Status: DC | PRN

## 2012-09-16 MED ORDER — FENTANYL CITRATE 0.05 MG/ML IJ SOLN
INTRAMUSCULAR | Status: AC
  Filled 2012-09-16: qty 2

## 2012-09-16 MED ORDER — BIVALIRUDIN 250 MG IV SOLR
INTRAVENOUS | Status: AC
  Filled 2012-09-16: qty 250

## 2012-09-16 MED ORDER — CLOPIDOGREL BISULFATE 75 MG PO TABS
75.0000 mg | ORAL_TABLET | Freq: Every day | ORAL | Status: DC
  Administered 2012-09-17 – 2012-09-18 (×2): 75 mg via ORAL
  Filled 2012-09-16 (×2): qty 1

## 2012-09-16 MED ORDER — ASPIRIN EC 325 MG PO TBEC
325.0000 mg | DELAYED_RELEASE_TABLET | Freq: Every day | ORAL | Status: DC
  Administered 2012-09-17: 325 mg via ORAL
  Filled 2012-09-16 (×3): qty 1

## 2012-09-16 MED ORDER — LIDOCAINE HCL (PF) 1 % IJ SOLN
INTRAMUSCULAR | Status: AC
  Filled 2012-09-16: qty 30

## 2012-09-16 MED ORDER — MORPHINE SULFATE 2 MG/ML IJ SOLN: 1.0000 mg | INTRAMUSCULAR | Status: DC | PRN

## 2012-09-16 MED ORDER — SODIUM CHLORIDE 0.9 % IV SOLN: INTRAVENOUS | Status: AC

## 2012-09-16 MED ORDER — CLOPIDOGREL BISULFATE 300 MG PO TABS
ORAL_TABLET | ORAL | Status: AC
  Filled 2012-09-16: qty 1

## 2012-09-16 MED ORDER — NITROGLYCERIN 0.2 MG/ML ON CALL CATH LAB
INTRAVENOUS | Status: AC
  Filled 2012-09-16: qty 1

## 2012-09-16 MED ORDER — HEPARIN (PORCINE) IN NACL 2-0.9 UNIT/ML-% IJ SOLN
INTRAMUSCULAR | Status: AC
  Filled 2012-09-16: qty 500

## 2012-09-16 NOTE — Progress Notes (Signed)
Subjective:  No CP/SOB  Objective:  Temp:  [97.5 F (36.4 C)-98.4 F (36.9 C)] 98.2 F (36.8 C) (12/03 0400) Pulse Rate:  [80-97] 81  (12/03 0700) Resp:  [19-28] 21  (12/02 1600) BP: (98-137)/(50-89) 137/66 mmHg (12/03 0600) SpO2:  [92 %-98 %] 98 % (12/03 0700) Weight:  [84.6 kg (186 lb 8.2 oz)-85.2 kg (187 lb 13.3 oz)] 84.6 kg (186 lb 8.2 oz) (12/03 0500) Weight change:   Intake/Output from previous day: 12/02 0701 - 12/03 0700 In: 1165.5 [P.O.:720; I.V.:445.5] Out: 1640 [Urine:1640]  Intake/Output from this shift:    Physical Exam: General appearance: alert, cooperative and no distress Neck: no adenopathy, no carotid bruit, no JVD, supple, symmetrical, trachea midline and thyroid not enlarged, symmetric, no tenderness/mass/nodules Lungs: clear to auscultation bilaterally Heart: regular rate and rhythm, S1, S2 normal, no murmur, click, rub or gallop Extremities: extremities normal, atraumatic, no cyanosis or edema  Lab Results: Results for orders placed during the hospital encounter of 09/15/12 (from the past 48 hour(s))  GLUCOSE, CAPILLARY     Status: Normal   Collection Time   09/15/12 11:57 AM      Component Value Range Comment   Glucose-Capillary 96  70 - 99 mg/dL   TROPONIN I     Status: Normal   Collection Time   09/15/12 12:39 PM      Component Value Range Comment   Troponin I <0.30  <0.30 ng/mL   CK TOTAL AND CKMB     Status: Abnormal   Collection Time   09/15/12 12:39 PM      Component Value Range Comment   Total CK 162  7 - 232 U/L    CK, MB 4.2 (*) 0.3 - 4.0 ng/mL    Relative Index 2.6 (*) 0.0 - 2.5   PROTIME-INR     Status: Normal   Collection Time   09/15/12 12:40 PM      Component Value Range Comment   Prothrombin Time 14.9  11.6 - 15.2 seconds    INR 1.19  0.00 - 1.49   APTT     Status: Normal   Collection Time   09/15/12 12:40 PM      Component Value Range Comment   aPTT 30  24 - 37 seconds   TSH     Status: Normal   Collection Time   09/15/12  12:40 PM      Component Value Range Comment   TSH 2.284  0.350 - 4.500 uIU/mL   MAGNESIUM     Status: Normal   Collection Time   09/15/12 12:40 PM      Component Value Range Comment   Magnesium 2.0  1.5 - 2.5 mg/dL   HEMOGLOBIN B1Y     Status: Abnormal   Collection Time   09/15/12 12:40 PM      Component Value Range Comment   Hemoglobin A1C 6.7 (*) <5.7 %    Mean Plasma Glucose 146 (*) <117 mg/dL   BASIC METABOLIC PANEL     Status: Abnormal   Collection Time   09/15/12 12:40 PM      Component Value Range Comment   Sodium 139  135 - 145 mEq/L    Potassium 3.7  3.5 - 5.1 mEq/L    Chloride 99  96 - 112 mEq/L    CO2 28  19 - 32 mEq/L    Glucose, Bld 96  70 - 99 mg/dL    BUN 15  6 - 23 mg/dL  Creatinine, Ser 0.77  0.50 - 1.35 mg/dL    Calcium 9.0  8.4 - 16.1 mg/dL    GFR calc non Af Amer 81 (*) >90 mL/min    GFR calc Af Amer >90  >90 mL/min   CBC     Status: Abnormal   Collection Time   09/15/12 12:40 PM      Component Value Range Comment   WBC 9.2  4.0 - 10.5 K/uL    RBC 4.06 (*) 4.22 - 5.81 MIL/uL    Hemoglobin 12.8 (*) 13.0 - 17.0 g/dL    HCT 09.6 (*) 04.5 - 52.0 %    MCV 91.6  78.0 - 100.0 fL    MCH 31.5  26.0 - 34.0 pg    MCHC 34.4  30.0 - 36.0 g/dL    RDW 40.9  81.1 - 91.4 %    Platelets 199  150 - 400 K/uL   MRSA PCR SCREENING     Status: Normal   Collection Time   09/15/12  2:19 PM      Component Value Range Comment   MRSA by PCR NEGATIVE  NEGATIVE   PLATELET INHIBITION P2Y12     Status: Normal   Collection Time   09/15/12  3:48 PM      Component Value Range Comment   Platelet Function  P2Y12 269  194 - 418 PRU   GLUCOSE, CAPILLARY     Status: Abnormal   Collection Time   09/15/12  4:50 PM      Component Value Range Comment   Glucose-Capillary 132 (*) 70 - 99 mg/dL   TROPONIN I     Status: Normal   Collection Time   09/15/12  5:32 PM      Component Value Range Comment   Troponin I <0.30  <0.30 ng/mL   HEPARIN LEVEL (UNFRACTIONATED)     Status: Abnormal    Collection Time   09/15/12  8:52 PM      Component Value Range Comment   Heparin Unfractionated 0.17 (*) 0.30 - 0.70 IU/mL   GLUCOSE, CAPILLARY     Status: Abnormal   Collection Time   09/15/12 10:08 PM      Component Value Range Comment   Glucose-Capillary 169 (*) 70 - 99 mg/dL   TROPONIN I     Status: Normal   Collection Time   09/16/12 12:22 AM      Component Value Range Comment   Troponin I <0.30  <0.30 ng/mL   BASIC METABOLIC PANEL     Status: Abnormal   Collection Time   09/16/12  4:58 AM      Component Value Range Comment   Sodium 133 (*) 135 - 145 mEq/L    Potassium 3.9  3.5 - 5.1 mEq/L    Chloride 96  96 - 112 mEq/L    CO2 28  19 - 32 mEq/L    Glucose, Bld 139 (*) 70 - 99 mg/dL    BUN 16  6 - 23 mg/dL    Creatinine, Ser 7.82  0.50 - 1.35 mg/dL    Calcium 9.1  8.4 - 95.6 mg/dL    GFR calc non Af Amer 81 (*) >90 mL/min    GFR calc Af Amer >90  >90 mL/min   CBC     Status: Abnormal   Collection Time   09/16/12  4:58 AM      Component Value Range Comment   WBC 10.5  4.0 - 10.5 K/uL  RBC 3.91 (*) 4.22 - 5.81 MIL/uL    Hemoglobin 12.6 (*) 13.0 - 17.0 g/dL    HCT 96.0 (*) 45.4 - 52.0 %    MCV 91.3  78.0 - 100.0 fL    MCH 32.2  26.0 - 34.0 pg    MCHC 35.3  30.0 - 36.0 g/dL    RDW 09.8  11.9 - 14.7 %    Platelets 210  150 - 400 K/uL   HEPARIN LEVEL (UNFRACTIONATED)     Status: Normal   Collection Time   09/16/12  4:58 AM      Component Value Range Comment   Heparin Unfractionated 0.38  0.30 - 0.70 IU/mL   LIPID PANEL     Status: Normal   Collection Time   09/16/12  4:58 AM      Component Value Range Comment   Cholesterol 138  0 - 200 mg/dL    Triglycerides 51  <829 mg/dL    HDL 51  >56 mg/dL    Total CHOL/HDL Ratio 2.7      VLDL 10  0 - 40 mg/dL    LDL Cholesterol 77  0 - 99 mg/dL   PRO B NATRIURETIC PEPTIDE     Status: Abnormal   Collection Time   09/16/12  4:58 AM      Component Value Range Comment   Pro B Natriuretic peptide (BNP) 2026.0 (*) 0 - 450 pg/mL      Imaging: Imaging results have been reviewed  Assessment/Plan:   1. Principal Problem: 2.  *Acute CHF 3. Active Problems: 4.  HYPERLIPIDEMIA 5.  CORONARY ARTERY DISEASE, CABG 1997 6.  PANCREATITIS, HX OF 7.  Unstable angina 8.  DM (diabetes mellitus) 9.   Time Spent Directly with Patient:  20 minutes  Length of Stay:  LOS: 1 day   Pt admitted with CHF and [place]. On IV hep/ntg. No CP/SOB. Enz neg. EKG with NSSTTWC. Exam benign. For cath today.  [name] 09/16/2012, 7:57 AM

## 2012-09-16 NOTE — Op Note (Signed)
[name] is a 78 y.o. male    [id number] LOCATION:  FACILITY: [facility]  PHYSICIAN: [name], M.D. 12/05/1925   DATE OF PROCEDURE:  09/16/2012  DATE OF DISCHARGE:  [facility]  CARDIAC CATHETERIZATION     History obtained from chart review.79 yoWM presented [facility] this am with SOB, he sat in a recliner for awhile with improvement but when tried to walk, SOB returned. No previous CHF. He also stated he had been DOE for about a week associated with chest pressure. No nausea or diaphoresis. At [facility] ER his Pro BNP was elevated at 2960, CXR with pul. Edema. He was given 40 IV Lasix, ASA 325, and NTG paste. Transferred by ambulance to [facility]. Currently no chest pain and no SOB. EKG with mild inf. Lat ischemia. Troponin 0.14.  History of CABG 1997 with LIMA to LAD, VG to 1st diag. Branch, OM and PDA. No cath since CABG. Last nuc lexiscan, 09/20/11 EF 38% , Large scar in mid distal LAD territory without reversible ischemia-low risk scan. Last Echo 11/27/11 EF 35-45% borderline concentric LVH, LA is moderately  dilated. Moderate Mitral calcification with mild MR, RV pressure is 30-40 mmHg, mild pulmonary HTN.    PROCEDURE DESCRIPTION:    The patient was brought to the second floor  [facility]iac cath lab in the postabsorptive state. He was  premedicated with Valium 5 mg by mouth, and IV Versed and fentanyl.Marland Kitchen His right groinwas prepped and shaved in usual sterile fashion. Xylocaine 1% was used for local anesthesia. A 5 French sheath was inserted into the right common femoral  artery using standard Seldinger technique. 5 French right and left Judkins diagnostic catheters along with a 5 French pigtail catheter and LIMA catheter were used for selective coronary angiography, selective vein graft and IMA angiography and left ventriculography. Visipaque dye was used for the entirety of the case appears retrograde aortic, left ventricular end pullback  pressures were recorded.   HEMODYNAMICS:    AO SYSTOLIC/AO DIASTOLIC: 123/57   LV SYSTOLIC/LV DIASTOLIC: 124/24  ANGIOGRAPHIC RESULTS:   1. Left main; normal  2. LAD; occluded proximally 3. Left circumflex; dominant with a 50-70% ostial stenosis, 99% mid AV groove stenosis AV groove stenosis and long 60-70% segmental stenosis distally prior to the takeoff of the PDA. The first obtuse marginal branch was occluded proximally.  4. Right coronary artery; nondominant with a 95% mid stenosis 5.LIMA TO LAD; widely patent 6. SVG TO diagonal branch was widely patent     SVG TO first obtuse marginal branch was widely patent     SVG TO PDA off the dominant circumflex had a 99% focal "apple core" lesion in the proximal portion 7. Left ventriculography; RAO left ventriculogram was performed using  25 mL of Visipaque dye at 12 mL/second. The overall LVEF estimated  20-25 %Without wall motion abnormalities  IMPRESSION:[name] has severe LV dysfunction with patent grafts and a high-grade proximal SVG stenosis to the dominant circumflex PDA. Will proceed with PCI and stenting using Angiomax and spider distal protection.  Procedure description: a 5 French sheath in the right common femoral artery was exchanged over a wire for a 6 [place] sheath. Patient already received 4 baby aspirin and was on 75 mg of Plavix daily. He received an additional 300 mg of by mouth Plavix. He received Angiomax bolus with an ACT of 299. Total contrast administered the patient was 195 cc. Using a 6 [place] RCB with sidehole guide catheter  along with a 0.14/190 cm length Asahi pro-water wire predilatation was performed with a 2 mm x 12 mm long balloon. Following this a 3 mm spider distal protection device was then deployed in the body of the graft and stenting was then performed with a 2.75 mm x 15 mm long expedition drug-eluting stent deployed at 16 atmospheres. Following this post dilatation was performed with a 3.25 mm x 12 mm  long noncompliant balloon at 16 atmospheres (3.36 mm) resulting in reduction of a 99% focal stenosis to 0% residual. There was excellent distal flow. The spider distal protection device was then recaptured encompassed into the angiography was performed revealing a widely patent stent with excellent runoff.  Overall impression: Successful PCI and stenting of a high-grade proximal SVG to the dominant circumflex PDA stenosis using drug-eluting stent and distal protection along with Angiomax. The patient tolerated the procedure well. The guidewire and catheter were removed and the sheath was then securely in place. It will be removed in proximately 3 hours and pressure will be held on the groin to achieve hemostasis. The patient will be treated medically including aspirin and Plavix. Because of his severe LV dysfunction he'll need a life vest placed prior to discharge the consideration of an ICD in 3 months if his EF is not improve for primary prevention.  [name], [facility] 09/16/2012 11:50 AM

## 2012-09-16 NOTE — H&P (Signed)
    Pt was reexamined and existing H & P reviewed. No changes found.  [name], MD [facility] 09/16/2012 10:20 AM

## 2012-09-16 NOTE — Progress Notes (Signed)
Order for sheath removal verified per post procedural orders. Procedure explained to patient and Rt femoral artery access site assessed: level 0, palpable dorsalis pedis and posterior tibial pulses. 6French Sheath removed and manual pressure applied for 20 minutes. Pre, peri, & post procedural vitals: HR 70's, RR 16, O2 Sat upper 90's, BP 136/54, Pain 0. Distal pulses remained intact after sheath removal. Access site level 0 and dressed with 4X4 gauze tape.  CCU, RN confirmed condition of site. Post procedural instructions discussed with return demonstration from patient.

## 2012-09-16 NOTE — Progress Notes (Signed)
Life vest information has been faxed to [employer].  I discussed the need for vest with pt and wife. They are in agreement.

## 2012-09-16 NOTE — Progress Notes (Signed)
ANTICOAGULATION CONSULT NOTE - Follow Up Consult  Pharmacy Consult for heparin Indication: chest pain/ACS and atrial fibrillation  Allergies  Allergen Reactions  . Colesevelam     unknown  . Ezetimibe-Simvastatin     unknown    Patient Measurements: Height: 5\' 8"  (172.7 cm) Weight: 186 lb 8.2 oz (84.6 kg) IBW/kg (Calculated) : 68.4  Heparin Dosing Weight: 85 kg  Vital Signs: Temp: 98.1 F (36.7 C) (12/03 0800) Temp src: Oral (12/03 0400) BP: 124/64 mmHg (12/03 0828) Pulse Rate: 76  (12/03 0800)  Labs:  [name] 09/16/12 0458 09/16/12 0022 09/15/12 2052 09/15/12 1732 09/15/12 1240 09/15/12 1239  HGB 12.6* -- -- -- 12.8* --  HCT 35.7* -- -- -- 37.2* --  PLT 210 -- -- -- 199 --  APTT -- -- -- -- 30 --  LABPROT -- -- -- -- 14.9 --  INR -- -- -- -- 1.19 --  HEPARINUNFRC 0.38 -- 0.17* -- -- --  CREATININE 0.77 -- -- -- 0.77 --  CKTOTAL -- -- -- -- -- 162  CKMB -- -- -- -- -- 4.2*  TROPONINI -- <0.30 -- <0.30 -- <0.30    Estimated Creatinine Clearance: 72.8 ml/min (by C-G formula based on Cr of 0.77).   Medications:  Scheduled:     . [COMPLETED] aspirin  324 mg Oral Pre-Cath  . aspirin EC  325 mg Oral Daily  . atorvastatin  20 mg Oral q1800  . clopidogrel  75 mg Oral Daily  . diazepam  2 mg Oral On Call  . doxazosin  4 mg Oral QHS  . finasteride  5 mg Oral Daily  . fluticasone  1 spray Each Nare Daily  . [COMPLETED] furosemide      . [COMPLETED] furosemide  40 mg Intravenous Once  . [COMPLETED] heparin  2,500 Units Intravenous Once  . [COMPLETED] heparin  4,000 Units Intravenous Once  . influenza  inactive virus vaccine  0.5 mL Intramuscular Tomorrow-1000  . insulin aspart  0-9 Units Subcutaneous TID WC  . insulin glargine  25 Units Subcutaneous QHS  . metoprolol  25 mg Oral BID  . mometasone-formoterol  2 puff Inhalation BID  . multivitamin with minerals  1 tablet Oral Daily  . nitroGLYCERIN  0.5 inch Topical Q6H  . [COMPLETED] potassium chloride  20 mEq  Oral Once  . sodium chloride  3 mL Intravenous Q12H  . [DISCONTINUED] diazepam  2 mg Oral On Call  . [DISCONTINUED] insulin glargine  25 Units Subcutaneous QHS  . [DISCONTINUED] multivitamin  1 tablet Oral Daily    Assessment: 78 yr old male transferred from [facility] with increasing SOB and chest pressure for 1 week, now on heparin for NSTEMI vs CHF. Patient to go to cath lab today. Heparin level this morning at goal after repeat bolus and increase to drip rate. CBC is stable and no bleeding noted.   Goal of Therapy:  Heparin level 0.3-0.7 units/ml Monitor platelets by anticoagulation protocol: Yes   Plan:  1. Continue heparin drip at 1350 units/hr 2. Daily CBC and heparin level 3. Follow up results of cath  Sheranda Seabrooks D. Josefa Syracuse, PharmD Clinical Pharmacist Pager: [phone] Phone: [phone] 09/16/2012 9:34 AM

## 2012-09-17 LAB — CBC
MCH: 31.5 pg (ref 26.0–34.0)
MCV: 90.8 fL (ref 78.0–100.0)
Platelets: 196 10*3/uL (ref 150–400)
RDW: 14.3 % (ref 11.5–15.5)

## 2012-09-17 LAB — GLUCOSE, CAPILLARY: Glucose-Capillary: 193 mg/dL — ABNORMAL HIGH (ref 70–99)

## 2012-09-17 LAB — BASIC METABOLIC PANEL
Calcium: 9.1 mg/dL (ref 8.4–10.5)
Creatinine, Ser: 0.76 mg/dL (ref 0.50–1.35)
GFR calc Af Amer: >90 mL/min (ref >90)
GFR calc non Af Amer: 81 mL/min — ABNORMAL LOW (ref >90)
Sodium: 134 mEq/L — ABNORMAL LOW (ref 135–145)

## 2012-09-17 MED ORDER — FUROSEMIDE 20 MG PO TABS
20.0000 mg | ORAL_TABLET | Freq: Every day | ORAL | Status: DC
  Administered 2012-09-17 – 2012-09-18 (×2): 20 mg via ORAL
  Filled 2012-09-17 (×2): qty 1

## 2012-09-17 MED ORDER — DOXAZOSIN MESYLATE 2 MG PO TABS
2.0000 mg | ORAL_TABLET | Freq: Every day | ORAL | Status: DC
  Administered 2012-09-17: 2 mg via ORAL
  Filled 2012-09-17 (×2): qty 1

## 2012-09-17 MED ORDER — LISINOPRIL 5 MG PO TABS
5.0000 mg | ORAL_TABLET | Freq: Every day | ORAL | Status: DC
  Administered 2012-09-18: 5 mg via ORAL
  Filled 2012-09-17: qty 1

## 2012-09-17 MED FILL — Dextrose Inj 5%: INTRAVENOUS | Qty: 50 | Status: AC

## 2012-09-17 NOTE — Progress Notes (Signed)
Watched the heart failure and life vest video with wife.

## 2012-09-17 NOTE — Progress Notes (Addendum)
[facility]  DAILY PROGRESS NOTE   Subjective:  No events overnight. Breathing is better today. PCI yesterday to graft to the PDA, non-dominant RCA.  Objective:  Temp:  [97.4 F (36.3 C)-98.6 F (37 C)] 98.1 F (36.7 C) (12/04 0736) Pulse Rate:  [71-93] 74  (12/04 0736) BP: (98-148)/(45-76) 114/53 mmHg (12/04 0736) SpO2:  [91 %-100 %] 92 % (12/04 0736) Weight:  [84.2 kg (185 lb 10 oz)] 84.2 kg (185 lb 10 oz) (12/04 0430) Weight change: -1 kg (-2 lb 3.3 oz)  Intake/Output from previous day: 12/03 0701 - 12/04 0700 In: 822 [P.O.:240; I.V.:582] Out: 970 [Urine:970]  Intake/Output from this shift:    Medications: Current Facility-Administered Medications  Medication Dose Route Frequency Provider Last Rate Last Dose  . [EXPIRED] 0.9 %  sodium chloride infusion   Intravenous Continuous [name], MD 75 mL/hr at 09/16/12 1215 75 mL/hr at 09/16/12 1215  . acetaminophen (TYLENOL) tablet 650 mg  650 mg Oral Q4H PRN [name], MD      . ALPRAZolam Prudy Feeler) tablet 0.25 mg  0.25 mg Oral BID PRN Nada Boozer, NP      . aspirin EC tablet 325 mg  325 mg Oral Daily [name], MD      . atorvastatin (LIPITOR) tablet 20 mg  20 mg Oral q1800 Nada Boozer, NP   20 mg at 09/16/12 1717  . [COMPLETED] bivalirudin (ANGIOMAX) 250 MG injection           . [COMPLETED] clopidogrel (PLAVIX) 300 MG tablet           . clopidogrel (PLAVIX) tablet 75 mg  75 mg Oral Q breakfast [name], MD      . [address] diazepam (VALIUM) tablet 2 mg  2 mg Oral On Call Lauren Bajbus, PHARMD   2 mg at 09/16/12 0951  . doxazosin (CARDURA) tablet 4 mg  4 mg Oral QHS Nada Boozer, NP   4 mg at 09/16/12 2157  . [COMPLETED] fentaNYL (SUBLIMAZE) 0.05 MG/ML injection           . finasteride (PROSCAR) tablet 5 mg  5 mg Oral Daily Nada Boozer, NP   5 mg at 09/16/12 0945  . fluticasone (FLONASE) 50 MCG/ACT nasal spray 1 spray  1 spray Each Nare Daily Nada Boozer, NP   1  spray at 09/15/12 1603  . [COMPLETED] heparin 2-0.9 UNIT/ML-% infusion           . [COMPLETED] heparin 2-0.9 UNIT/ML-% infusion           . [COMPLETED] influenza  inactive virus vaccine (FLUZONE/FLUARIX) injection 0.5 mL  0.5 mL Intramuscular Tomorrow-1000 Lennette Bihari, MD   0.5 mL at 09/16/12 1718  . insulin aspart (novoLOG) injection 0-9 Units  0-9 Units Subcutaneous TID WC Nada Boozer, NP   2 Units at 09/16/12 1749  . insulin glargine (LANTUS) injection 25 Units  25 Units Subcutaneous QHS Lennette Bihari, MD   25 Units at 09/16/12 2155  . [COMPLETED] lidocaine (XYLOCAINE) 1 % injection           . metoprolol tartrate (LOPRESSOR) tablet 25 mg  25 mg Oral BID Nada Boozer, NP   25 mg at 09/16/12 2157  . [COMPLETED] midazolam (VERSED) 2 MG/2ML injection           . mometasone-formoterol (DULERA) 100-5 MCG/ACT inhaler 2 puff  2 puff Inhalation BID Nada Boozer, NP   2 puff at 09/16/12 2014  .  morphine 2 MG/ML injection 1 mg  1 mg Intravenous Q1H PRN [name], MD      . nitroGLYCERIN (NITROSTAT) SL tablet 0.4 mg  0.4 mg Sublingual Q5 Min x 3 PRN Nada Boozer, NP      . [COMPLETED] nitroGLYCERIN (NTG ON-CALL) 0.2 mg/mL injection           . ondansetron (ZOFRAN) injection 4 mg  4 mg Intravenous Q6H PRN [name], MD      . zolpidem [facility]) tablet 5 mg  5 mg Oral QHS PRN Nada Boozer, NP   5 mg at 09/15/12 2229  . [DISCONTINUED] 0.9 %  sodium chloride infusion   Intravenous Continuous Nada Boozer, NP 10 mL/hr at 09/15/12 1343 10 mL/hr at 09/15/12 1343  . [DISCONTINUED] 0.9 %  sodium chloride infusion  250 mL Intravenous PRN Nada Boozer, NP      . [DISCONTINUED] 0.9 %  sodium chloride infusion   Intravenous Continuous Nada Boozer, NP 10 mL/hr at 09/16/12 0546    . [DISCONTINUED] acetaminophen (TYLENOL) tablet 650 mg  650 mg Oral Q4H PRN Nada Boozer, NP      . [DISCONTINUED] aspirin EC tablet 325 mg  325 mg Oral Daily Nada Boozer, NP      . [DISCONTINUED] clopidogrel (PLAVIX)  tablet 75 mg  75 mg Oral Daily Nada Boozer, NP   75 mg at 09/16/12 0946  . [DISCONTINUED] heparin ADULT infusion 100 units/mL (25000 units/250 mL)  1,350 Units/hr Intravenous Continuous Lennette Bihari, MD 13.5 mL/hr at 09/16/12 0351 1,350 Units/hr at 09/16/12 0351  . [DISCONTINUED] multivitamin with minerals tablet 1 tablet  1 tablet Oral Daily Lennette Bihari, MD   1 tablet at 09/16/12 0945  . [DISCONTINUED] nitroGLYCERIN (NITROGLYN) 2 % ointment 0.5 inch  0.5 inch Topical Q6H Nada Boozer, NP   0.5 inch at 09/16/12 0545  . [DISCONTINUED] ondansetron (ZOFRAN) injection 4 mg  4 mg Intravenous Q6H PRN Nada Boozer, NP      . [DISCONTINUED] sodium chloride 0.9 % injection 3 mL  3 mL Intravenous Q12H Nada Boozer, NP      . [DISCONTINUED] sodium chloride 0.9 % injection 3 mL  3 mL Intravenous PRN Nada Boozer, NP        Physical Exam: General appearance: alert and no distress Neck: no adenopathy, no carotid bruit, no JVD, supple, symmetrical, trachea midline and thyroid not enlarged, symmetric, no tenderness/mass/nodules Lungs: diminished breath sounds RLL and rales RLL Heart: regular rate and rhythm, S1, S2 normal, no murmur, click, rub or gallop Abdomen: soft, non-tender; bowel sounds normal; no masses,  no organomegaly Extremities: extremities normal, atraumatic, no cyanosis or edema Pulses: 1+ symmetric  Lab Results: Results for orders placed during the hospital encounter of 09/15/12 (from the past 48 hour(s))  GLUCOSE, CAPILLARY     Status: Normal   Collection Time   09/15/12 11:57 AM      Component Value Range Comment   Glucose-Capillary 96  70 - 99 mg/dL   TROPONIN I     Status: Normal   Collection Time   09/15/12 12:39 PM      Component Value Range Comment   Troponin I <0.30  <0.30 ng/mL   CK TOTAL AND CKMB     Status: Abnormal   Collection Time   09/15/12 12:39 PM      Component Value Range Comment   Total CK 162  7 - 232 U/L    CK, MB 4.2 (*) 0.3 - 4.0  ng/mL    Relative Index  2.6 (*) 0.0 - 2.5   PROTIME-INR     Status: Normal   Collection Time   09/15/12 12:40 PM      Component Value Range Comment   Prothrombin Time 14.9  11.6 - 15.2 seconds    INR 1.19  0.00 - 1.49   APTT     Status: Normal   Collection Time   09/15/12 12:40 PM      Component Value Range Comment   aPTT 30  24 - 37 seconds   TSH     Status: Normal   Collection Time   09/15/12 12:40 PM      Component Value Range Comment   TSH 2.284  0.350 - 4.500 uIU/mL   MAGNESIUM     Status: Normal   Collection Time   09/15/12 12:40 PM      Component Value Range Comment   Magnesium 2.0  1.5 - 2.5 mg/dL   HEMOGLOBIN Z6X     Status: Abnormal   Collection Time   09/15/12 12:40 PM      Component Value Range Comment   Hemoglobin A1C 6.7 (*) <5.7 %    Mean Plasma Glucose 146 (*) <117 mg/dL   BASIC METABOLIC PANEL     Status: Abnormal   Collection Time   09/15/12 12:40 PM      Component Value Range Comment   Sodium 139  135 - 145 mEq/L    Potassium 3.7  3.5 - 5.1 mEq/L    Chloride 99  96 - 112 mEq/L    CO2 28  19 - 32 mEq/L    Glucose, Bld 96  70 - 99 mg/dL    BUN 15  6 - 23 mg/dL    Creatinine, Ser 0.96  0.50 - 1.35 mg/dL    Calcium 9.0  8.4 - 04.5 mg/dL    GFR calc non Af Amer 81 (*) >90 mL/min    GFR calc Af Amer >90  >90 mL/min   CBC     Status: Abnormal   Collection Time   09/15/12 12:40 PM      Component Value Range Comment   WBC 9.2  4.0 - 10.5 K/uL    RBC 4.06 (*) 4.22 - 5.81 MIL/uL    Hemoglobin 12.8 (*) 13.0 - 17.0 g/dL    HCT 40.9 (*) 81.1 - 52.0 %    MCV 91.6  78.0 - 100.0 fL    MCH 31.5  26.0 - 34.0 pg    MCHC 34.4  30.0 - 36.0 g/dL    RDW 91.4  78.2 - 95.6 %    Platelets 199  150 - 400 K/uL   MRSA PCR SCREENING     Status: Normal   Collection Time   09/15/12  2:19 PM      Component Value Range Comment   MRSA by PCR NEGATIVE  NEGATIVE   PLATELET INHIBITION P2Y12     Status: Normal   Collection Time   09/15/12  3:48 PM      Component Value Range Comment   Platelet Function   P2Y12 269  194 - 418 PRU   GLUCOSE, CAPILLARY     Status: Abnormal   Collection Time   09/15/12  4:50 PM      Component Value Range Comment   Glucose-Capillary 132 (*) 70 - 99 mg/dL   TROPONIN I     Status: Normal   Collection Time   09/15/12  5:32 PM  Component Value Range Comment   Troponin I <0.30  <0.30 ng/mL   HEPARIN LEVEL (UNFRACTIONATED)     Status: Abnormal   Collection Time   09/15/12  8:52 PM      Component Value Range Comment   Heparin Unfractionated 0.17 (*) 0.30 - 0.70 IU/mL   GLUCOSE, CAPILLARY     Status: Abnormal   Collection Time   09/15/12 10:08 PM      Component Value Range Comment   Glucose-Capillary 169 (*) 70 - 99 mg/dL   TROPONIN I     Status: Normal   Collection Time   09/16/12 12:22 AM      Component Value Range Comment   Troponin I <0.30  <0.30 ng/mL   BASIC METABOLIC PANEL     Status: Abnormal   Collection Time   09/16/12  4:58 AM      Component Value Range Comment   Sodium 133 (*) 135 - 145 mEq/L    Potassium 3.9  3.5 - 5.1 mEq/L    Chloride 96  96 - 112 mEq/L    CO2 28  19 - 32 mEq/L    Glucose, Bld 139 (*) 70 - 99 mg/dL    BUN 16  6 - 23 mg/dL    Creatinine, Ser 1.61  0.50 - 1.35 mg/dL    Calcium 9.1  8.4 - 09.6 mg/dL    GFR calc non Af Amer 81 (*) >90 mL/min    GFR calc Af Amer >90  >90 mL/min   CBC     Status: Abnormal   Collection Time   09/16/12  4:58 AM      Component Value Range Comment   WBC 10.5  4.0 - 10.5 K/uL    RBC 3.91 (*) 4.22 - 5.81 MIL/uL    Hemoglobin 12.6 (*) 13.0 - 17.0 g/dL    HCT 04.5 (*) 40.9 - 52.0 %    MCV 91.3  78.0 - 100.0 fL    MCH 32.2  26.0 - 34.0 pg    MCHC 35.3  30.0 - 36.0 g/dL    RDW 81.1  91.4 - 78.2 %    Platelets 210  150 - 400 K/uL   HEPARIN LEVEL (UNFRACTIONATED)     Status: Normal   Collection Time   09/16/12  4:58 AM      Component Value Range Comment   Heparin Unfractionated 0.38  0.30 - 0.70 IU/mL   LIPID PANEL     Status: Normal   Collection Time   09/16/12  4:58 AM      Component Value  Range Comment   Cholesterol 138  0 - 200 mg/dL    Triglycerides 51  <956 mg/dL    HDL 51  >21 mg/dL    Total CHOL/HDL Ratio 2.7      VLDL 10  0 - 40 mg/dL    LDL Cholesterol 77  0 - 99 mg/dL   PRO B NATRIURETIC PEPTIDE     Status: Abnormal   Collection Time   09/16/12  4:58 AM      Component Value Range Comment   Pro B Natriuretic peptide (BNP) 2026.0 (*) 0 - 450 pg/mL   GLUCOSE, CAPILLARY     Status: Abnormal   Collection Time   09/16/12  8:30 AM      Component Value Range Comment   Glucose-Capillary 115 (*) 70 - 99 mg/dL   POCT ACTIVATED CLOTTING TIME     Status: Normal   Collection Time  09/16/12 11:02 AM      Component Value Range Comment   Activated Clotting Time 299     GLUCOSE, CAPILLARY     Status: Normal   Collection Time   09/16/12 12:34 PM      Component Value Range Comment   Glucose-Capillary 95  70 - 99 mg/dL   GLUCOSE, CAPILLARY     Status: Abnormal   Collection Time   09/16/12  5:07 PM      Component Value Range Comment   Glucose-Capillary 170 (*) 70 - 99 mg/dL   GLUCOSE, CAPILLARY     Status: Abnormal   Collection Time   09/16/12  9:42 PM      Component Value Range Comment   Glucose-Capillary 168 (*) 70 - 99 mg/dL    Comment 1 Documented in Chart      Comment 2 Notify RN     CBC     Status: Abnormal   Collection Time   09/17/12  4:55 AM      Component Value Range Comment   WBC 10.7 (*) 4.0 - 10.5 K/uL    RBC 4.00 (*) 4.22 - 5.81 MIL/uL    Hemoglobin 12.6 (*) 13.0 - 17.0 g/dL    HCT 16.1 (*) 09.6 - 52.0 %    MCV 90.8  78.0 - 100.0 fL    MCH 31.5  26.0 - 34.0 pg    MCHC 34.7  30.0 - 36.0 g/dL    RDW 04.5  40.9 - 81.1 %    Platelets 196  150 - 400 K/uL   BASIC METABOLIC PANEL     Status: Abnormal   Collection Time   09/17/12  4:55 AM      Component Value Range Comment   Sodium 134 (*) 135 - 145 mEq/L    Potassium 3.6  3.5 - 5.1 mEq/L    Chloride 97  96 - 112 mEq/L    CO2 27  19 - 32 mEq/L    Glucose, Bld 120 (*) 70 - 99 mg/dL    BUN 12  6 - 23 mg/dL     Creatinine, Ser 9.14  0.50 - 1.35 mg/dL    Calcium 9.1  8.4 - 78.2 mg/dL    GFR calc non Af Amer 81 (*) >90 mL/min    GFR calc Af Amer >90  >90 mL/min     Imaging: Dg Chest Port 1 View  09/16/2012  *RADIOLOGY REPORT*  Clinical Data: CHF, shortness of breath.  PORTABLE CHEST - 1 VIEW  Comparison: 09/15/2012  Findings: Cardiomegaly.  Vascular congestion.  Improving edema and focal right basilar airspace opacity.  Suspect mild interstitial edema persisting.  No visible effusions.  IMPRESSION: Improving edema pattern and right basilar airspace disease.   Original Report Authenticated By: Charlett Nose, M.D.     Assessment:  1. Principal Problem: 2.  *Acute CHF 3. Active Problems: 4.  HYPERLIPIDEMIA 5.  CORONARY ARTERY DISEASE, CABG 1997 6.  PANCREATITIS, HX OF 7.  Unstable angina 8.  DM (diabetes mellitus) 9.  Cardiomyopathy, ischemic, EF 20-25% by cath 09/16/12 10.  At risk for sudden cardiac death 64.  S/P angioplasty with stent to prox SVG to LCX-PDA 09/26/12  12.   Plan:  1. Successful PCI to the graft to PDA yesterday. Says he is breathing much better today. Never had chest pain. CM has worsenened, now with EF 20-25%. Agree with LifeVest, hopeful to get fitted today. Troponins negative x 3. P2Y12 is adequate at 269. BNP was  2026, will need more diuresis today. Re-check BNP in the am tomorrow. A1c is 6.7 which is very good control. Ambulate with cardiac rehabilitation today.    *Plan add lasix 20 mg daily. Reduce doxazosin to 2 mg QHS. Add lisinopril 5 mg daily tomorrow if BP allows for heart failure.  Time Spent Directly with Patient:  15 minutes  Length of Stay:  LOS: 2 days   Chrystie Nose, MD, [facility] Attending Cardiologist [facility]  HILTY,Kenneth C 09/17/2012, 8:02 AM

## 2012-09-17 NOTE — Progress Notes (Signed)
CARDIAC REHAB PHASE I   PRE:  Rate/Rhythm: 74 SR  BP:  Supine: 123/55  Sitting:   Standing:    SaO2: 96 RA  MODE:  Ambulation: 700 ft   POST:  Rate/Rhythem: 87 SR  BP:  Supine: 131/55  Sitting:   Standing:    SaO2: 94 RA 1115-1230 Pt tolerated ambulation well without c/o of cp or SOB. VS stable. Pt states that he is feeling much better and his SOB is much improved. Completed stent and CHF education with pt and wife. They voice understanding. Pt agrees to [employer]. CRP in [place], will send referral.  [name]

## 2012-09-17 NOTE — Care Management Note (Addendum)
    Page 1 of 2   09/18/2012     10:33:03 AM   CARE MANAGEMENT NOTE 09/18/2012  Patient:  [name], [name]   Account Number:  [id number]  Date Initiated:  09/15/2012  Documentation initiated by:  Junius Creamer  Subjective/Objective Assessment:   adm w chf     Action/Plan:   lives w wife, pcp dr Casimiro Needle norins   Anticipated DC Date:  09/18/2012   Anticipated DC Plan:  HOME/SELF CARE      DC Planning Services  CM consult      PAC Choice  DURABLE MEDICAL EQUIPMENT   Choice offered to / List presented to:     DME arranged  VEST - LIFE VEST      DME agency  OTHER - SEE NOTE        Status of service:   Medicare Important Message given?   (If response is "NO", the following Medicare IM given date fields will be blank) Date Medicare IM given:   Date Additional Medicare IM given:    Discharge Disposition:  HOME/SELF CARE  Per UR Regulation:  Reviewed for med. necessity/level of care/duration of stay  If discussed at Long Length of Stay Meetings, dates discussed:    Comments:  12/5 10:30a debbie Jayliah Benett rn,bsn spoke w wife. pt has been fitted for lifevest. they do not anicipate any other dc needs at this time. hope for dc later today.  09/17/12 10:35a debbie Pariss Hommes rn,bsn left lifevest form on chart. saw md note that would need lifevest prior to disch.  12/2 12:10p debbie Krystle Polcyn rn,bsn

## 2012-09-18 LAB — CBC
HCT: 36.5 % — ABNORMAL LOW (ref 39.0–52.0)
Hemoglobin: 12.6 g/dL — ABNORMAL LOW (ref 13.0–17.0)
RBC: 4.02 MIL/uL — ABNORMAL LOW (ref 4.22–5.81)
RDW: 14.1 % (ref 11.5–15.5)
WBC: 11.3 10*3/uL — ABNORMAL HIGH (ref 4.0–10.5)

## 2012-09-18 LAB — GLUCOSE, CAPILLARY: Glucose-Capillary: 112 mg/dL — ABNORMAL HIGH (ref 70–99)

## 2012-09-18 MED ORDER — NITROGLYCERIN 0.4 MG SL SUBL: 0.4000 mg | SUBLINGUAL_TABLET | SUBLINGUAL | Status: DC | PRN

## 2012-09-18 MED ORDER — ASPIRIN EC 81 MG PO TBEC
162.0000 mg | DELAYED_RELEASE_TABLET | Freq: Every day | ORAL | Status: DC
  Administered 2012-09-18: 162 mg via ORAL
  Filled 2012-09-18: qty 2

## 2012-09-18 MED ORDER — LISINOPRIL 10 MG PO TABS: 10.0000 mg | ORAL_TABLET | Freq: Every day | ORAL | Status: DC

## 2012-09-18 MED ORDER — PANTOPRAZOLE SODIUM 40 MG PO TBEC: 40.0000 mg | DELAYED_RELEASE_TABLET | Freq: Every day | ORAL | Status: DC

## 2012-09-18 MED ORDER — ASPIRIN 81 MG PO TBEC: 162.0000 mg | DELAYED_RELEASE_TABLET | Freq: Every day | ORAL | Status: DC

## 2012-09-18 MED ORDER — ACETAMINOPHEN 325 MG PO TABS: 650.0000 mg | ORAL_TABLET | ORAL | Status: DC | PRN

## 2012-09-18 MED ORDER — FUROSEMIDE 20 MG PO TABS: 20.0000 mg | ORAL_TABLET | Freq: Every day | ORAL | Status: DC

## 2012-09-18 MED ORDER — ASPIRIN EC 81 MG PO TBEC: 81.0000 mg | DELAYED_RELEASE_TABLET | Freq: Every day | ORAL | Status: DC

## 2012-09-18 MED ORDER — PANTOPRAZOLE SODIUM 40 MG PO TBEC
40.0000 mg | DELAYED_RELEASE_TABLET | Freq: Every day | ORAL | Status: DC
  Administered 2012-09-18: 40 mg via ORAL
  Filled 2012-09-18: qty 1

## 2012-09-18 NOTE — Discharge Summary (Signed)
Patient ID: [name],  MRN: [id number], DOB/AGE: Apr 09, 1926 78 y.o.  Admit date: 09/15/2012 Discharge date: 09/18/2012  Primary Care Provider:  Primary Cardiologist: Dr Allyson Sabal  Discharge Diagnoses:  Principal Problem:  *Acute on chronic systolic CHF (congestive heart failure)  Active Problems:  Unstable angina  Cardiomyopathy, ischemic, EF 20-25% by cath 09/16/12, (30-35% by echo).   S/P angioplasty with stent to prox SVG to LCX-PDA 09/26/12   CORONARY ARTERY DISEASE, CABG 1997  DM (diabetes mellitus), Type 2 IDDM  At risk for sudden cardiac death. Discharged with LifeVest  HYPERLIPIDEMIA  BENIGN PROSTATIC HYPERTROPHY    Procedures: Cath/ PCI DES SVG-PDA on 09/16/12   Hospital Course:  88 yoWM presented to [facility]15/12 with SOB, he sat in a recliner for awhile with improvement but when tried to walk, SOB returned. No previous CHF. He also stated he had been DOE for about a week associated with chest pressure. No nausea or diaphoresis. At [facility] ER his Pro BNP was elevated at 2960, CXR with pul. Edema. He was given 40 IV Lasix, ASA 325, and NTG paste. Transferred by ambulance to [facility]. He had  no chest pain and no SOB on admission here. EKG with mild inf. Lat ischemia. Troponin 0.14. He has a history of CABG 1997 with LIMA to LAD, VG to 1st diag. Branch, OM and PDA. He has not had a  cath since. Last nuc lexiscan, 09/20/11 EF 38% and mid-distal LAD scar. Overall it was read as low risk. The pt had a cath on 12/03 which revealed stenosis in the SVG-PDA. This was stented with a DES. He tolerated this well. We feel he can be discharged 09/19/12. He will need a BMP on follow up as Lisinopril and Lasix are new. He did have a LifeVest placed at discharge as his EF is now 30-35% by echo. This will need to be checked again in 3 months.   Discharge Vitals:  Blood pressure 116/61, pulse 76, temperature 98 F (36.7 C), temperature source Oral, resp. rate 21, height 5\' 8"  (1.727 m),  weight 83.7 kg (184 lb 8.4 oz), SpO2 91.00%.    Labs: Results for orders placed during the hospital encounter of 09/15/12 (from the past 48 hour(s))  GLUCOSE, CAPILLARY     Status: Normal   Collection Time   09/16/12 12:34 PM      Component Value Range Comment   Glucose-Capillary 95  70 - 99 mg/dL   GLUCOSE, CAPILLARY     Status: Abnormal   Collection Time   09/16/12  5:07 PM      Component Value Range Comment   Glucose-Capillary 170 (*) 70 - 99 mg/dL   GLUCOSE, CAPILLARY     Status: Abnormal   Collection Time   09/16/12  9:42 PM      Component Value Range Comment   Glucose-Capillary 168 (*) 70 - 99 mg/dL    Comment 1 Documented in Chart      Comment 2 Notify RN     CBC     Status: Abnormal   Collection Time   09/17/12  4:55 AM      Component Value Range Comment   WBC 10.7 (*) 4.0 - 10.5 K/uL    RBC 4.00 (*) 4.22 - 5.81 MIL/uL    Hemoglobin 12.6 (*) 13.0 - 17.0 g/dL    HCT 40.9 (*) 81.1 - 52.0 %    MCV 90.8  78.0 - 100.0 fL    MCH 31.5  26.0 - 34.0  pg    MCHC 34.7  30.0 - 36.0 g/dL    RDW 16.1  09.6 - 04.5 %    Platelets 196  150 - 400 K/uL   BASIC METABOLIC PANEL     Status: Abnormal   Collection Time   09/17/12  4:55 AM      Component Value Range Comment   Sodium 134 (*) 135 - 145 mEq/L    Potassium 3.6  3.5 - 5.1 mEq/L    Chloride 97  96 - 112 mEq/L    CO2 27  19 - 32 mEq/L    Glucose, Bld 120 (*) 70 - 99 mg/dL    BUN 12  6 - 23 mg/dL    Creatinine, Ser 4.09  0.50 - 1.35 mg/dL    Calcium 9.1  8.4 - 81.1 mg/dL    GFR calc non Af Amer 81 (*) >90 mL/min    GFR calc Af Amer >90  >90 mL/min   GLUCOSE, CAPILLARY     Status: Abnormal   Collection Time   09/17/12  7:38 AM      Component Value Range Comment   Glucose-Capillary 113 (*) 70 - 99 mg/dL   GLUCOSE, CAPILLARY     Status: Abnormal   Collection Time   09/17/12 12:11 PM      Component Value Range Comment   Glucose-Capillary 147 (*) 70 - 99 mg/dL   GLUCOSE, CAPILLARY     Status: Abnormal   Collection Time    09/17/12  4:28 PM      Component Value Range Comment   Glucose-Capillary 193 (*) 70 - 99 mg/dL   GLUCOSE, CAPILLARY     Status: Abnormal   Collection Time   09/17/12  9:45 PM      Component Value Range Comment   Glucose-Capillary 167 (*) 70 - 99 mg/dL   CBC     Status: Abnormal   Collection Time   09/18/12  4:54 AM      Component Value Range Comment   WBC 11.3 (*) 4.0 - 10.5 K/uL    RBC 4.02 (*) 4.22 - 5.81 MIL/uL    Hemoglobin 12.6 (*) 13.0 - 17.0 g/dL    HCT 91.4 (*) 78.2 - 52.0 %    MCV 90.8  78.0 - 100.0 fL    MCH 31.3  26.0 - 34.0 pg    MCHC 34.5  30.0 - 36.0 g/dL    RDW 95.6  21.3 - 08.6 %    Platelets 208  150 - 400 K/uL   GLUCOSE, CAPILLARY     Status: Abnormal   Collection Time   09/18/12  8:18 AM      Component Value Range Comment   Glucose-Capillary 112 (*) 70 - 99 mg/dL     Disposition:      Follow-up Information    Follow up with Abelino Derrick, PA. (office will call)    Contact information:   [address] [place] [address] [phone]          Discharge Medications:    Medication List     As of 09/18/2012 11:22 AM    STOP taking these medications         insulin lispro protamine-insulin lispro (50-50) 100 UNIT/ML Susp   Commonly known as: HUMALOG 50/50      metoprolol succinate 50 MG 24 hr tablet   Commonly known as: TOPROL-XL      TAKE these medications  acetaminophen 325 MG tablet   Commonly known as: TYLENOL   Take 2 tablets (650 mg total) by mouth every 4 (four) hours as needed.      ADVAIR DISKUS 100-50 MCG/DOSE Aepb   Generic drug: Fluticasone-Salmeterol   USE ONE INHALATION INTO THE LUNGS TWO TIMES DAILY      aspirin 81 MG EC tablet   Take 2 tablets (162 mg total) by mouth daily.      doxazosin 4 MG tablet   Commonly known as: CARDURA   Take 4 mg by mouth at bedtime.      finasteride 5 MG tablet   Commonly known as: PROSCAR   Take 5 mg by mouth daily.      furosemide 20 MG tablet   Commonly known  as: LASIX   Take 1 tablet (20 mg total) by mouth daily.      hydroxypropyl methylcellulose 2.5 % ophthalmic solution   Commonly known as: ISOPTO TEARS   Place 1 drop into both eyes daily as needed. For dry eyes      LANTUS SOLOSTAR 100 UNIT/ML injection   Generic drug: insulin glargine   INJECT 25 UNITS AND TITRATE UP AS DIRECTED      lisinopril 10 MG tablet   Commonly known as: PRINIVIL,ZESTRIL   Take 1 tablet (10 mg total) by mouth daily.      metoprolol 50 MG tablet   Commonly known as: LOPRESSOR   Take 50 mg by mouth 2 (two) times daily. Takes half of a tablet BID      multivitamin tablet   Take 1 tablet by mouth daily.      NASONEX 50 MCG/ACT nasal spray   Generic drug: mometasone   USE 2 SPRAYS INTO THE NOSE DAILY AS DIRECTED      nitroGLYCERIN 0.4 MG SL tablet   Commonly known as: NITROSTAT   Place 1 tablet (0.4 mg total) under the tongue every 5 (five) minutes x 3 doses as needed for chest pain.      pantoprazole 40 MG tablet   Commonly known as: PROTONIX   Take 1 tablet (40 mg total) by mouth daily.      pioglitazone 30 MG tablet   Commonly known as: ACTOS   Take 1 tablet (30 mg total) by mouth daily.      PLAVIX 75 MG tablet   Generic drug: clopidogrel   Take 75 mg by mouth daily.      rosuvastatin 10 MG tablet   Commonly known as: CRESTOR   Take 10 mg by mouth at bedtime.           Duration of Discharge Encounter: Greater than 30 minutes including physician time.  Jolene Provost PA-C 09/18/2012 11:22 AM

## 2012-09-18 NOTE — Progress Notes (Signed)
CARDIAC REHAB PHASE I   PRE:  Rate/Rhythm: 72 SR    BP: sitting 104/37    SaO2:   MODE:  Ambulation: 700 ft   POST:  Rate/Rhythm: 88 SR    BP: sitting 124/43     SaO2:   No c/o walking with assist x1. Pt tended to stagger which wife says is normal for him. Slight LOB x1. Encouraged pt to take his time and use a cane if need be. All questions answered. Feels good. [id number]  Harriet Masson CES, ACSM

## 2012-09-18 NOTE — Progress Notes (Signed)
Pt discharged home with wife. Pt and wife verbalize understanding. Prior to discharge, the lifevest rep met with wife and answered further questions about the vest. No questions or complaints at this time. Pt denies cp, sob, or any other complaints. All belongings with pt and pt d/c'd with life vest and battery.

## 2012-09-18 NOTE — Progress Notes (Signed)
Subjective:  No chest pain, no SOB  Objective:  Vital Signs in the last 24 hours: Temp:  [98 F (36.7 C)-98.6 F (37 C)] 98 F (36.7 C) (12/05 0814) Pulse Rate:  [76-85] 76  (12/05 0814) Resp:  [19-21] 21  (12/05 0800) BP: (113-133)/(49-72) 116/61 mmHg (12/05 0807) SpO2:  [90 %-100 %] 91 % (12/05 0814) Weight:  [83.7 kg (184 lb 8.4 oz)] 83.7 kg (184 lb 8.4 oz) (12/05 0500)  Intake/Output from previous day:  Intake/Output Summary (Last 24 hours) at 09/18/12 0830 Last data filed at 09/17/12 2200  Gross per 24 hour  Intake    620 ml  Output      0 ml  Net    620 ml      . aspirin EC  162 mg Oral Daily  . atorvastatin  20 mg Oral q1800  . clopidogrel  75 mg Oral Q breakfast  . doxazosin  2 mg Oral QHS  . finasteride  5 mg Oral Daily  . fluticasone  1 spray Each Nare Daily  . furosemide  20 mg Oral Daily  . insulin aspart  0-9 Units Subcutaneous TID WC  . insulin glargine  25 Units Subcutaneous QHS  . lisinopril  5 mg Oral Daily  . metoprolol  25 mg Oral BID  . mometasone-formoterol  2 puff Inhalation BID  . pantoprazole  40 mg Oral Daily  . [DISCONTINUED] aspirin EC  325 mg Oral Daily      Physical Exam: General appearance: alert, cooperative and no distress No JVD Lungs: clear to auscultation bilaterally Heart: regular rate and rhythm Rt groin without hematoma Abd; soft, BS+ No edema   Rate: 76  Rhythm: normal sinus rhythm  Lab Results:  Basename 09/18/12 0454 09/17/12 0455  WBC 11.3* 10.7*  HGB 12.6* 12.6*  PLT 208 196    Basename 09/17/12 0455 09/16/12 0458  NA 134* 133*  K 3.6 3.9  CL 97 96  CO2 27 28  GLUCOSE 120* 139*  BUN 12 16  CREATININE 0.76 0.77    Basename 09/16/12 0022 09/15/12 1732  TROPONINI <0.30 <0.30   Hepatic Function Panel No results found for this basename: PROT,ALBUMIN,AST,ALT,ALKPHOS,BILITOT,BILIDIR,IBILI in the last 72 hours  Basename 09/16/12 0458  CHOL 138    Basename 09/15/12 1240  INR 1.19     Imaging: Imaging results have been reviewed  Cardiac Studies:  Assessment/Plan:   Principal Problem:  *Acute on chronic systolic CHF (congestive heart failure) Active Problems:  Unstable angina  Cardiomyopathy, ischemic, EF 20-25% by cath 09/16/12  S/P angioplasty with stent to prox SVG to LCX-PDA 09/26/12   CORONARY ARTERY DISEASE, CABG 1997  DM (diabetes mellitus)  At risk for sudden cardiac death  HYPERLIPIDEMIA  BENIGN PROSTATIC HYPERTROPHY  Plan- Pt of Dr Arthur Holms and Dr Allyson Sabal from [place]. LifeVest fitted. I can see in one to two weeks then follow up with Dr Allyson Sabal on a couple of months. Repeat echo in 3 mos. I cut ASA to 162mg  and added PPI.    Corine Shelter PA-C 09/18/2012, 8:30 AM   Patient seen and examined. Agree with assessment and plan. No signs of CHF.  EF 25% at cath; s/p PCI to SVG to OM. Will titrate lisinopril to 10 mg daily. Will decrease ASA to 81 mg with Plavix. Life-vest post discharge with hopeful improvement in LV function post PCI and titrating outpatient medical therapy.   Lennette Bihari, MD, [facility] 09/18/2012 9:00 AM

## 2012-09-18 NOTE — Progress Notes (Signed)
*  PRELIMINARY RESULTS* Echocardiogram 2D Echocardiogram has been performed.  Jeryl Columbia 09/18/2012, 10:43 AM

## 2012-09-29 ENCOUNTER — Other Ambulatory Visit: Payer: Self-pay | Admitting: Internal Medicine

## 2012-10-27 ENCOUNTER — Encounter: Payer: Self-pay | Admitting: Internal Medicine

## 2012-10-31 ENCOUNTER — Other Ambulatory Visit (HOSPITAL_COMMUNITY): Payer: Self-pay | Admitting: Cardiology

## 2012-10-31 DIAGNOSIS — I429 Cardiomyopathy, unspecified: Secondary | ICD-10-CM

## 2012-11-07 ENCOUNTER — Ambulatory Visit (HOSPITAL_COMMUNITY)
Admission: RE | Admit: 2012-11-07 | Discharge: 2012-11-07 | Disposition: A | Discharge status: Home / Self Care | Payer: Medicare Other | Source: Ambulatory Visit | Attending: Cardiovascular Disease | Admitting: Cardiovascular Disease

## 2012-11-07 DIAGNOSIS — I429 Cardiomyopathy, unspecified: Secondary | ICD-10-CM

## 2012-12-11 ENCOUNTER — Ambulatory Visit (HOSPITAL_COMMUNITY): Payer: Medicare Other

## 2012-12-12 ENCOUNTER — Telehealth: Payer: Self-pay | Admitting: Internal Medicine

## 2012-12-12 MED ORDER — INSULIN GLARGINE 100 UNIT/ML ~~LOC~~ SOLN: SUBCUTANEOUS | Status: DC

## 2012-12-12 NOTE — Telephone Encounter (Signed)
New rx sent to [facility] on [address] Called pt's spouse to let her know that it was sent in.

## 2012-12-12 NOTE — Telephone Encounter (Signed)
Caller: [name]/Spouse; Phone: [phone]; Reason for Call: Wife calling today 12/12/12 regarding pt is diabetic and when he gave himself his injection this AM the injector broke for his Lantus.  He takes 25 units.  The injector is a 100 Unit injector.  Pharmacy cannot give a new injector without a new script.  He nees this by tonight.  PHARMACY IS [facility] ON [place], [place] [phone].  PLEASE CALL PT BACK AT [phone] TO LET HER KNOW THIS HAS BEEN DONE.  Thanks.

## 2012-12-15 ENCOUNTER — Other Ambulatory Visit: Payer: Self-pay | Admitting: Internal Medicine

## 2012-12-16 ENCOUNTER — Other Ambulatory Visit: Payer: Self-pay | Admitting: *Deleted

## 2012-12-16 MED ORDER — ROSUVASTATIN CALCIUM 10 MG PO TABS: 10.0000 mg | ORAL_TABLET | Freq: Every day | ORAL | Status: DC

## 2012-12-18 ENCOUNTER — Other Ambulatory Visit (HOSPITAL_COMMUNITY): Payer: Self-pay | Admitting: Cardiovascular Disease

## 2012-12-18 ENCOUNTER — Ambulatory Visit (HOSPITAL_COMMUNITY)
Admission: RE | Admit: 2012-12-18 | Discharge: 2012-12-18 | Disposition: A | Discharge status: Home / Self Care | Payer: Medicare Other | Source: Ambulatory Visit | Attending: Cardiology | Admitting: Cardiology

## 2012-12-18 DIAGNOSIS — I2589 Other forms of chronic ischemic heart disease: Secondary | ICD-10-CM | POA: Insufficient documentation

## 2012-12-18 NOTE — Progress Notes (Signed)
2D Echo Performed 12/18/2012    Tammie Crouch, RCS  

## 2012-12-31 ENCOUNTER — Ambulatory Visit (HOSPITAL_COMMUNITY)
Admission: RE | Admit: 2012-12-31 | Discharge: 2012-12-31 | Disposition: A | Discharge status: Home / Self Care | Payer: Medicare Other | Source: Ambulatory Visit | Attending: Cardiovascular Disease | Admitting: Cardiovascular Disease

## 2012-12-31 ENCOUNTER — Ambulatory Visit (HOSPITAL_COMMUNITY): Payer: Medicare Other

## 2012-12-31 DIAGNOSIS — I509 Heart failure, unspecified: Secondary | ICD-10-CM | POA: Insufficient documentation

## 2012-12-31 DIAGNOSIS — I739 Peripheral vascular disease, unspecified: Secondary | ICD-10-CM | POA: Insufficient documentation

## 2012-12-31 DIAGNOSIS — E785 Hyperlipidemia, unspecified: Secondary | ICD-10-CM | POA: Insufficient documentation

## 2012-12-31 DIAGNOSIS — I2589 Other forms of chronic ischemic heart disease: Secondary | ICD-10-CM | POA: Insufficient documentation

## 2012-12-31 NOTE — Progress Notes (Signed)
Carotid Duplex Complete Chloie Loney 

## 2013-01-01 ENCOUNTER — Ambulatory Visit (HOSPITAL_COMMUNITY): Payer: Medicare Other

## 2013-02-25 ENCOUNTER — Other Ambulatory Visit: Payer: Self-pay | Admitting: Internal Medicine

## 2013-03-24 ENCOUNTER — Other Ambulatory Visit: Payer: Self-pay | Admitting: Internal Medicine

## 2013-03-30 ENCOUNTER — Other Ambulatory Visit: Payer: Self-pay | Admitting: Internal Medicine

## 2013-04-07 ENCOUNTER — Other Ambulatory Visit: Payer: Self-pay

## 2013-04-08 ENCOUNTER — Other Ambulatory Visit: Payer: Self-pay

## 2013-04-08 MED ORDER — BLOOD GLUCOSE METER KIT: PACK | Status: AC

## 2013-04-08 MED ORDER — GLUCOSE BLOOD VI STRP: ORAL_STRIP | Status: DC

## 2013-04-08 MED ORDER — PRODIGY LANCETS 28G MISC: Status: DC

## 2013-05-30 ENCOUNTER — Other Ambulatory Visit (HOSPITAL_COMMUNITY): Payer: Self-pay | Admitting: Cardiovascular Disease

## 2013-06-01 NOTE — Telephone Encounter (Signed)
Rx was sent to pharmacy electronically. 

## 2013-06-06 ENCOUNTER — Other Ambulatory Visit (HOSPITAL_COMMUNITY): Payer: Self-pay | Admitting: Cardiovascular Disease

## 2013-06-09 ENCOUNTER — Ambulatory Visit (INDEPENDENT_AMBULATORY_CARE_PROVIDER_SITE_OTHER): Payer: Medicare Other | Admitting: Cardiology

## 2013-06-09 ENCOUNTER — Encounter: Payer: Self-pay | Admitting: Cardiology

## 2013-06-09 VITALS — BP 122/58 | HR 54 | Ht 67.0 in | Wt 185.6 lb

## 2013-06-09 DIAGNOSIS — I739 Peripheral vascular disease, unspecified: Secondary | ICD-10-CM

## 2013-06-09 DIAGNOSIS — I255 Ischemic cardiomyopathy: Secondary | ICD-10-CM

## 2013-06-09 DIAGNOSIS — E119 Type 2 diabetes mellitus without complications: Secondary | ICD-10-CM

## 2013-06-09 DIAGNOSIS — E785 Hyperlipidemia, unspecified: Secondary | ICD-10-CM

## 2013-06-09 DIAGNOSIS — Z9889 Other specified postprocedural states: Secondary | ICD-10-CM

## 2013-06-09 DIAGNOSIS — I2510 Atherosclerotic heart disease of native coronary artery without angina pectoris: Secondary | ICD-10-CM

## 2013-06-09 DIAGNOSIS — Z95820 Peripheral vascular angioplasty status with implants and grafts: Secondary | ICD-10-CM

## 2013-06-09 DIAGNOSIS — G4730 Sleep apnea, unspecified: Secondary | ICD-10-CM

## 2013-06-09 DIAGNOSIS — I2589 Other forms of chronic ischemic heart disease: Secondary | ICD-10-CM

## 2013-06-09 NOTE — Assessment & Plan Note (Signed)
Less than 49% by dopplers March 2014

## 2013-06-09 NOTE — Assessment & Plan Note (Signed)
No CHF symptoms 

## 2013-06-09 NOTE — Assessment & Plan Note (Signed)
No angina 

## 2013-06-09 NOTE — Patient Instructions (Signed)
Your physician recommends that you schedule a follow-up appointment in: 6 months  

## 2013-06-09 NOTE — Progress Notes (Signed)
06/09/2013 [name].   [birth year]  [id number]  Primary Physicia Illene Regulus, MD Primary Cardiologist: Dr Allyson Sabal  HPI:  [name] 79 y/o (looks younger) with a history of CABG in '97. He was admitted in December 2013 with [place]. He underwent cath and subsequent SVG-CFX/PDA DES. His LV was depressed and he went home with a Life Vest but fortunately his EF improved to 45% in March 2014 and this was able to be discontinued. He is here today for a 6 month check up. He denies chest pain or SOB. No problems that he knows of with his medications. No weakness or syncope (HR 54).   Current Outpatient Prescriptions  Medication Sig Dispense Refill  . acetaminophen (TYLENOL) 325 MG tablet Take 2 tablets (650 mg total) by mouth every 4 (four) hours as needed.      Marland Kitchen ADVAIR DISKUS 100-50 MCG/DOSE AEPB USE ONE INHALATION INTO THE LUNGS TWO TIMES DAILY  180 each  2  . aspirin EC 81 MG EC tablet Take 2 tablets (162 mg total) by mouth daily.      . Blood Glucose Monitoring Suppl (BLOOD GLUCOSE METER) kit Use as instructed  1 each  0  . clopidogrel (PLAVIX) 75 MG tablet TAKE 1 TABLET DAILY  90 tablet  2  . CRESTOR 10 MG tablet TAKE 1 TABLET DAILY  90 tablet  2  . doxazosin (CARDURA) 4 MG tablet Take 4 mg by mouth at bedtime.        . furosemide (LASIX) 20 MG tablet Take 1 tablet (20 mg total) by mouth daily.  30 tablet  5  . glucose blood test strip Use to test blood sugars  100 each  5  . hydroxypropyl methylcellulose (ISOPTO TEARS) 2.5 % ophthalmic solution Place 1 drop into both eyes daily as needed. For dry eyes      . insulin glargine (LANTUS SOLOSTAR) 100 UNIT/ML injection Inject 25 units and titrate up as directed.  20 mL  1  . lisinopril (PRINIVIL,ZESTRIL) 10 MG tablet Take 1 tablet (10 mg total) by mouth daily.  30 tablet  5  . metoprolol (LOPRESSOR) 50 MG tablet TAKE ONE-HALF (1/2) TABLET TWICE A DAY  90 tablet  1  . Multiple Vitamin (MULTIVITAMIN) tablet Take 1 tablet by mouth daily.        Marland Kitchen  NASONEX 50 MCG/ACT nasal spray USE 2 SPRAYS NASALLY DAILY AS DIRECTED  17 g  3  . nitroGLYCERIN (NITROSTAT) 0.4 MG SL tablet Place 1 tablet (0.4 mg total) under the tongue every 5 (five) minutes x 3 doses as needed for chest pain.  25 tablet  2  . pantoprazole (PROTONIX) 40 MG tablet Take 1 tablet (40 mg total) by mouth daily.  30 tablet  5  . pioglitazone (ACTOS) 30 MG tablet TAKE 1 TABLET DAILY  90 tablet  0  . PRODIGY LANCETS 28G MISC Use to test blood sugars dx: 250.00  100 each  5   No current facility-administered medications for this visit.    Allergies  Allergen Reactions  . Colesevelam     unknown  . Ezetimibe-Simvastatin     unknown    History   Social History  . Marital Status: Married    Spouse Name: N/A    Number of Children: N/A  . Years of Education: N/A   Occupational History  . Not on file.   Social History Main Topics  . Smoking status: Former Smoker    Types: Cigars  .  Smokeless tobacco: Not on file  . Alcohol Use: 4.2 oz/week    7 Glasses of wine per week  . Drug Use: No  . Sexual Activity: Not on file   Other Topics Concern  . Not on file   Social History Narrative   Married '50-24 yrs, divorced; married '82   1 son- '51   Retired but keeps up 4 rental houses, mows   End of Life: no CPR, no heroic or futile measures     Review of Systems: General: negative for chills, fever, night sweats or weight changes.  Cardiovascular: negative for chest pain, dyspnea on exertion, edema, orthopnea, palpitations, paroxysmal nocturnal dyspnea or shortness of breath Dermatological: negative for rash Respiratory: negative for cough or wheezing Urologic: negative for hematuria Abdominal: negative for nausea, vomiting, diarrhea, bright red blood per rectum, melena, or hematemesis Neurologic: negative for visual changes, syncope, or dizziness All other systems reviewed and are otherwise negative except as noted above.    Blood pressure 122/58, pulse 54,  height 5\' 7"  (1.702 m), weight 185 lb 9.6 oz (84.188 kg).  General appearance: alert, cooperative and no distress Neck: no carotid bruit and no JVD Lungs: clear to auscultation bilaterally Heart: regular rate and rhythm, S1, S2 normal, no murmur, click, rub or gallop  EKG NSR, Sinus bradycardia  ASSESSMENT AND PLAN:   ICM, EF 20-25% by cath 09/16/12- improved to 45 % by Echo 3/14 No CHF symptoms  S/P angioplasty with stent to prox SVG to LCX-PDA 09/26/12  No angina  CORONARY ARTERY DISEASE, CABG 1997 .  DM (diabetes mellitus) .  HYPERLIPIDEMIA .  Sleep apnea- C-pap .  PVD- moderate carotid disease Less than 49% by dopplers March 2014   PLAN  [name] seems to be doing well. He is active and denies any exertional symptoms. If he had fatigue or any pre syncopal symptoms I would decrease his beta blocker but this appears to be stable. Follow up with Dr Allyson Sabal in 6 months. He will need f/u lipids and LFTs then.  Harlow Basley KPA-C 06/09/2013 10:24 AM

## 2013-06-27 ENCOUNTER — Other Ambulatory Visit: Payer: Self-pay | Admitting: Internal Medicine

## 2013-08-05 ENCOUNTER — Other Ambulatory Visit (HOSPITAL_COMMUNITY): Payer: Self-pay | Admitting: Cardiovascular Disease

## 2013-08-05 DIAGNOSIS — I739 Peripheral vascular disease, unspecified: Secondary | ICD-10-CM

## 2013-08-12 ENCOUNTER — Ambulatory Visit (HOSPITAL_COMMUNITY)
Admission: RE | Admit: 2013-08-12 | Discharge: 2013-08-12 | Disposition: A | Discharge status: Home / Self Care | Payer: Medicare Other | Source: Ambulatory Visit | Attending: Cardiovascular Disease | Admitting: Cardiovascular Disease

## 2013-08-12 DIAGNOSIS — I739 Peripheral vascular disease, unspecified: Secondary | ICD-10-CM | POA: Insufficient documentation

## 2013-08-12 NOTE — Progress Notes (Signed)
Carotid Duplex Completed. °Brianna L Mazza,RVT °

## 2013-08-18 ENCOUNTER — Telehealth: Payer: Self-pay | Admitting: *Deleted

## 2013-08-18 ENCOUNTER — Encounter: Payer: Self-pay | Admitting: *Deleted

## 2013-08-18 DIAGNOSIS — I6529 Occlusion and stenosis of unspecified carotid artery: Secondary | ICD-10-CM

## 2013-08-18 NOTE — Telephone Encounter (Signed)
Message copied by Marella Bile on Tue Aug 18, 2013  4:17 PM ------      Message from: [name]      Created: Mon Aug 17, 2013 10:39 AM       No change from prior study. Repeat in 6 months ------

## 2013-08-18 NOTE — Telephone Encounter (Signed)
Order placed for repeat carotid doppler in 6 months 

## 2013-08-28 ENCOUNTER — Other Ambulatory Visit: Payer: Self-pay | Admitting: Internal Medicine

## 2013-10-01 ENCOUNTER — Telehealth: Payer: Self-pay

## 2013-10-01 NOTE — Telephone Encounter (Signed)
Patient's 06/09/13 office note, demographics, and A1c results from 08/12/08 03/09/10 and 09/15/12 have been faxed to [phone] - [employer], [employer]

## 2013-10-10 ENCOUNTER — Other Ambulatory Visit: Payer: Self-pay | Admitting: Internal Medicine

## 2013-11-02 ENCOUNTER — Emergency Department (HOSPITAL_COMMUNITY): Payer: Medicare Other

## 2013-11-02 ENCOUNTER — Observation Stay (HOSPITAL_COMMUNITY)
Admission: EM | Admit: 2013-11-02 | Discharge: 2013-11-03 | Disposition: A | Discharge status: Home / Self Care | Payer: Medicare Other | Attending: Internal Medicine | Admitting: Internal Medicine

## 2013-11-02 ENCOUNTER — Encounter (HOSPITAL_COMMUNITY): Payer: Self-pay | Admitting: Emergency Medicine

## 2013-11-02 DIAGNOSIS — G319 Degenerative disease of nervous system, unspecified: Secondary | ICD-10-CM | POA: Insufficient documentation

## 2013-11-02 DIAGNOSIS — R55 Syncope and collapse: Principal | ICD-10-CM | POA: Diagnosis present

## 2013-11-02 DIAGNOSIS — E785 Hyperlipidemia, unspecified: Secondary | ICD-10-CM | POA: Diagnosis present

## 2013-11-02 DIAGNOSIS — N400 Enlarged prostate without lower urinary tract symptoms: Secondary | ICD-10-CM | POA: Diagnosis present

## 2013-11-02 DIAGNOSIS — R200 Anesthesia of skin: Secondary | ICD-10-CM

## 2013-11-02 DIAGNOSIS — I252 Old myocardial infarction: Secondary | ICD-10-CM | POA: Insufficient documentation

## 2013-11-02 DIAGNOSIS — I2589 Other forms of chronic ischemic heart disease: Secondary | ICD-10-CM

## 2013-11-02 DIAGNOSIS — E119 Type 2 diabetes mellitus without complications: Secondary | ICD-10-CM

## 2013-11-02 DIAGNOSIS — Z794 Long term (current) use of insulin: Secondary | ICD-10-CM

## 2013-11-02 DIAGNOSIS — I959 Hypotension, unspecified: Secondary | ICD-10-CM | POA: Insufficient documentation

## 2013-11-02 DIAGNOSIS — R209 Unspecified disturbances of skin sensation: Secondary | ICD-10-CM

## 2013-11-02 DIAGNOSIS — H8110 Benign paroxysmal vertigo, unspecified ear: Secondary | ICD-10-CM | POA: Diagnosis present

## 2013-11-02 DIAGNOSIS — J45909 Unspecified asthma, uncomplicated: Secondary | ICD-10-CM

## 2013-11-02 DIAGNOSIS — I6529 Occlusion and stenosis of unspecified carotid artery: Secondary | ICD-10-CM | POA: Insufficient documentation

## 2013-11-02 DIAGNOSIS — IMO0001 Reserved for inherently not codable concepts without codable children: Secondary | ICD-10-CM | POA: Diagnosis present

## 2013-11-02 DIAGNOSIS — Z951 Presence of aortocoronary bypass graft: Secondary | ICD-10-CM | POA: Insufficient documentation

## 2013-11-02 DIAGNOSIS — G4730 Sleep apnea, unspecified: Secondary | ICD-10-CM | POA: Diagnosis present

## 2013-11-02 DIAGNOSIS — I2510 Atherosclerotic heart disease of native coronary artery without angina pectoris: Secondary | ICD-10-CM | POA: Insufficient documentation

## 2013-11-02 DIAGNOSIS — I739 Peripheral vascular disease, unspecified: Secondary | ICD-10-CM

## 2013-11-02 DIAGNOSIS — I255 Ischemic cardiomyopathy: Secondary | ICD-10-CM

## 2013-11-02 DIAGNOSIS — E118 Type 2 diabetes mellitus with unspecified complications: Secondary | ICD-10-CM

## 2013-11-02 DIAGNOSIS — H538 Other visual disturbances: Secondary | ICD-10-CM

## 2013-11-02 HISTORY — DX: Gastro-esophageal reflux disease without esophagitis: K21.9

## 2013-11-02 HISTORY — DX: Essential (primary) hypertension: I10

## 2013-11-02 HISTORY — DX: Type 2 diabetes mellitus without complications: E11.9

## 2013-11-02 HISTORY — DX: Shortness of breath: R06.02

## 2013-11-02 LAB — COMPREHENSIVE METABOLIC PANEL
ALK PHOS: 59 U/L (ref 39–117)
ALT: 17 U/L (ref 0–53)
AST: 30 U/L (ref 0–37)
Albumin: 4 g/dL (ref 3.5–5.2)
BILIRUBIN TOTAL: 0.6 mg/dL (ref 0.3–1.2)
BUN: 17 mg/dL (ref 6–23)
CHLORIDE: 101 meq/L (ref 96–112)
CO2: 23 meq/L (ref 19–32)
Calcium: 8.7 mg/dL (ref 8.4–10.5)
Creatinine, Ser: 0.98 mg/dL (ref 0.50–1.35)
GFR, EST AFRICAN AMERICAN: 84 mL/min — AB (ref >90)
GFR, EST NON AFRICAN AMERICAN: 73 mL/min — AB (ref >90)
GLUCOSE: 171 mg/dL — AB (ref 70–99)
POTASSIUM: 5.6 meq/L — AB (ref 3.7–5.3)
Sodium: 137 mEq/L (ref 137–147)
Total Protein: 7 g/dL (ref 6.0–8.3)

## 2013-11-02 LAB — URINALYSIS W MICROSCOPIC + REFLEX CULTURE
Bilirubin Urine: NEGATIVE
Glucose, UA: NEGATIVE mg/dL
Ketones, ur: NEGATIVE mg/dL
LEUKOCYTES UA: NEGATIVE
NITRITE: NEGATIVE
Protein, ur: NEGATIVE mg/dL
Specific Gravity, Urine: 1.014 (ref 1.005–1.030)
UROBILINOGEN UA: 0.2 mg/dL (ref 0.0–1.0)
pH: 6 (ref 5.0–8.0)

## 2013-11-02 LAB — CBC WITH DIFFERENTIAL/PLATELET
Basophils Absolute: 0.1 10*3/uL (ref 0.0–0.1)
Basophils Relative: 1 % (ref 0–1)
Eosinophils Absolute: 0.7 10*3/uL (ref 0.0–0.7)
Eosinophils Relative: 7 % — ABNORMAL HIGH (ref 0–5)
HEMATOCRIT: 37.9 % — AB (ref 39.0–52.0)
HEMOGLOBIN: 13.3 g/dL (ref 13.0–17.0)
LYMPHS ABS: 1.4 10*3/uL (ref 0.7–4.0)
Lymphocytes Relative: 14 % (ref 12–46)
MCH: 32.5 pg (ref 26.0–34.0)
MCHC: 35.1 g/dL (ref 30.0–36.0)
MCV: 92.7 fL (ref 78.0–100.0)
MONOS PCT: 12 % (ref 3–12)
Monocytes Absolute: 1.2 10*3/uL — ABNORMAL HIGH (ref 0.1–1.0)
NEUTROS ABS: 6.4 10*3/uL (ref 1.7–7.7)
NEUTROS PCT: 66 % (ref 43–77)
Platelets: 168 10*3/uL (ref 150–400)
RBC: 4.09 MIL/uL — AB (ref 4.22–5.81)
RDW: 13.7 % (ref 11.5–15.5)
WBC: 9.7 10*3/uL (ref 4.0–10.5)

## 2013-11-02 LAB — TROPONIN I: Troponin I: <0.3 ng/mL (ref <0.30)

## 2013-11-02 LAB — GLUCOSE, CAPILLARY: Glucose-Capillary: 169 mg/dL — ABNORMAL HIGH (ref 70–99)

## 2013-11-02 LAB — POTASSIUM: Potassium: 4.3 mEq/L (ref 3.7–5.3)

## 2013-11-02 LAB — PROTIME-INR
INR: 1.12 (ref 0.00–1.49)
Prothrombin Time: 14.2 seconds (ref 11.6–15.2)

## 2013-11-02 LAB — PRO B NATRIURETIC PEPTIDE: PRO B NATRI PEPTIDE: 1351 pg/mL — AB (ref 0–450)

## 2013-11-02 MED ORDER — MOMETASONE FURO-FORMOTEROL FUM 100-5 MCG/ACT IN AERO
2.0000 | INHALATION_SPRAY | Freq: Two times a day (BID) | RESPIRATORY_TRACT | Status: DC
  Administered 2013-11-03: 2 via RESPIRATORY_TRACT
  Filled 2013-11-02: qty 8.8

## 2013-11-02 MED ORDER — ONDANSETRON HCL 4 MG PO TABS: 4.0000 mg | ORAL_TABLET | Freq: Four times a day (QID) | ORAL | Status: DC | PRN

## 2013-11-02 MED ORDER — INSULIN ASPART 100 UNIT/ML ~~LOC~~ SOLN
0.0000 [IU] | Freq: Three times a day (TID) | SUBCUTANEOUS | Status: DC
  Administered 2013-11-03: 2 [IU] via SUBCUTANEOUS

## 2013-11-02 MED ORDER — PANTOPRAZOLE SODIUM 40 MG PO TBEC
40.0000 mg | DELAYED_RELEASE_TABLET | Freq: Every day | ORAL | Status: DC
  Administered 2013-11-03: 40 mg via ORAL
  Filled 2013-11-02: qty 1

## 2013-11-02 MED ORDER — HEPARIN SODIUM (PORCINE) 5000 UNIT/ML IJ SOLN
5000.0000 [IU] | Freq: Three times a day (TID) | INTRAMUSCULAR | Status: DC
  Administered 2013-11-02 – 2013-11-03 (×3): 5000 [IU] via SUBCUTANEOUS
  Filled 2013-11-02 (×5): qty 1

## 2013-11-02 MED ORDER — INSULIN GLARGINE 100 UNITS/ML SOLOSTAR PEN: 15.0000 [IU] | PEN_INJECTOR | Freq: Every day | SUBCUTANEOUS | Status: DC

## 2013-11-02 MED ORDER — FUROSEMIDE 20 MG PO TABS
20.0000 mg | ORAL_TABLET | Freq: Every day | ORAL | Status: DC
  Administered 2013-11-03: 20 mg via ORAL
  Filled 2013-11-02: qty 1

## 2013-11-02 MED ORDER — ASPIRIN EC 81 MG PO TBEC
81.0000 mg | DELAYED_RELEASE_TABLET | Freq: Every day | ORAL | Status: DC
  Administered 2013-11-03: 81 mg via ORAL
  Filled 2013-11-02: qty 1

## 2013-11-02 MED ORDER — FINASTERIDE 5 MG PO TABS
5.0000 mg | ORAL_TABLET | Freq: Every day | ORAL | Status: DC
  Administered 2013-11-03: 5 mg via ORAL
  Filled 2013-11-02: qty 1

## 2013-11-02 MED ORDER — CLOPIDOGREL BISULFATE 75 MG PO TABS
75.0000 mg | ORAL_TABLET | Freq: Every day | ORAL | Status: DC
  Administered 2013-11-03: 75 mg via ORAL
  Filled 2013-11-02 (×2): qty 1

## 2013-11-02 MED ORDER — SODIUM CHLORIDE 0.9 % IV SOLN
INTRAVENOUS | Status: AC
  Administered 2013-11-02: 22:00:00 via INTRAVENOUS

## 2013-11-02 MED ORDER — SODIUM CHLORIDE 0.9 % IJ SOLN
3.0000 mL | Freq: Two times a day (BID) | INTRAMUSCULAR | Status: DC
  Administered 2013-11-02: 3 mL via INTRAVENOUS

## 2013-11-02 MED ORDER — HYPROMELLOSE (GONIOSCOPIC) 2.5 % OP SOLN
1.0000 [drp] | Freq: Every day | OPHTHALMIC | Status: DC | PRN
  Filled 2013-11-02: qty 15

## 2013-11-02 MED ORDER — ATORVASTATIN CALCIUM 20 MG PO TABS
20.0000 mg | ORAL_TABLET | Freq: Every day | ORAL | Status: DC
  Filled 2013-11-02: qty 1

## 2013-11-02 MED ORDER — ONDANSETRON HCL 4 MG/2ML IJ SOLN: 4.0000 mg | Freq: Once | INTRAMUSCULAR | Status: DC

## 2013-11-02 MED ORDER — DOXAZOSIN MESYLATE 4 MG PO TABS
4.0000 mg | ORAL_TABLET | Freq: Every day | ORAL | Status: DC
  Administered 2013-11-02: 4 mg via ORAL
  Filled 2013-11-02 (×2): qty 1

## 2013-11-02 MED ORDER — INSULIN GLARGINE 100 UNIT/ML ~~LOC~~ SOLN
15.0000 [IU] | Freq: Every day | SUBCUTANEOUS | Status: DC
  Administered 2013-11-02: 15 [IU] via SUBCUTANEOUS
  Filled 2013-11-02 (×2): qty 0.15

## 2013-11-02 MED ORDER — ACETAMINOPHEN 325 MG PO TABS: 650.0000 mg | ORAL_TABLET | Freq: Four times a day (QID) | ORAL | Status: DC | PRN

## 2013-11-02 MED ORDER — METOPROLOL TARTRATE 12.5 MG HALF TABLET
12.5000 mg | ORAL_TABLET | Freq: Two times a day (BID) | ORAL | Status: DC
  Administered 2013-11-02 – 2013-11-03 (×2): 12.5 mg via ORAL
  Filled 2013-11-02 (×3): qty 1

## 2013-11-02 MED ORDER — ACETAMINOPHEN 650 MG RE SUPP: 650.0000 mg | Freq: Four times a day (QID) | RECTAL | Status: DC | PRN

## 2013-11-02 MED ORDER — ONDANSETRON HCL 4 MG/2ML IJ SOLN: 4.0000 mg | Freq: Four times a day (QID) | INTRAMUSCULAR | Status: DC | PRN

## 2013-11-02 NOTE — H&P (Signed)
[facility] History and Physical  [name]. GEX:[id number] DOB: 12-22-1925 DOA: 11/02/2013  Referring physician: Dr. Aline Brochure PCP: Adella Hare, MD   Chief Complaint: near syncope, dizziness  HPI: [name]. is a 80 y.o. male with PMH as mentioned below; came to ED complaining of acute episode of dizziness/almost passing out event. Patient reports being ok this morning working on his car, after lung went to the bathroom, felt dizzy, experience one episode of vomiting and almost passed out. Per wife, patient was diaphoretic and when EMS arrived patient was hypotensive (SBP in the low 80's). Patient denies palpitations, CP, HA's, SOB, focal weakness, abd pain, melena, hematochezia or any other complaints. [facility] called to admit patient for further evaluation and treatment. BP improved with IVF's in ED.  Of note; patient reports to ED intermittent episodes of left facial numbness and blurred vision with event. Gone by time of my evaluation.    Review of Systems:  Negative except as otherwise mentioned on HPI.  Past Medical History  Diagnosis Date  . Allergy   . Asthma   . CAD (coronary artery disease)     CABG '97, SVG DES 12/13  . Diabetes mellitus   . Hyperlipidemia   . BPH (benign prostatic hyperplasia)   . Colonic polyp   . History of pancreatitis   . Hearing loss   . Myocardial infarction 1997  . Acute CHF 09/15/2012  . DM (diabetes mellitus) 09/15/2012  . Ischemic cardiomyopathy 09/16/2012    EF 25%- improved to 45% 3/14  . PVD (peripheral vascular disease)     moderate bilat carotid disease  . Sleep apnea     on C-pap   Past Surgical History  Procedure Laterality Date  . Coronary artery bypass graft  1997    X 5  . Right shoulder repair    . Hernia repair  1960  . Nose surgery  2007  . Coronary angioplasty with stent placement  Dec 2013    DES to SVG-CFX/PDA   Social History:  reports that he has quit smoking. His smoking use included Cigars. He  does not have any smokeless tobacco history on file. He reports that he drinks about 4.2 ounces of alcohol per week. He reports that he does not use illicit drugs.  Allergies  Allergen Reactions  . Colesevelam     unknown  . Ezetimibe-Simvastatin     unknown    FH: positive for HTN, HLD. Otherwise no contributory   Prior to Admission medications   Medication Sig Start Date End Date Taking? Authorizing Provider  acetaminophen (TYLENOL) 325 MG tablet Take 2 tablets (650 mg total) by mouth every 4 (four) hours as needed. 09/18/12  Yes Erlene Quan, PA-C  aspirin EC 81 MG EC tablet Take 2 tablets (162 mg total) by mouth daily. 09/18/12  Yes Erlene Quan, PA-C  Blood Glucose Monitoring Suppl (BLOOD GLUCOSE METER) kit Use as instructed 04/08/13  Yes Neena Rhymes, MD  clopidogrel (PLAVIX) 75 MG tablet Take 75 mg by mouth daily with breakfast.   Yes Historical Provider, MD  doxazosin (CARDURA) 4 MG tablet Take 4 mg by mouth at bedtime.     Yes Historical Provider, MD  finasteride (PROSCAR) 5 MG tablet Take 5 mg by mouth daily.   Yes Historical Provider, MD  Fluticasone-Salmeterol (ADVAIR) 100-50 MCG/DOSE AEPB Inhale 1 puff into the lungs 2 (two) times daily as needed (shortness of breath).   Yes Historical Provider, MD  furosemide (  LASIX) 20 MG tablet Take 20 mg by mouth daily. 09/18/12  Yes Erlene Quan, PA-C  glucose blood test strip Use to test blood sugars 04/08/13  Yes Neena Rhymes, MD  hydroxypropyl methylcellulose (ISOPTO TEARS) 2.5 % ophthalmic solution Place 1 drop into both eyes daily as needed. For dry eyes   Yes Historical Provider, MD  insulin glargine (LANTUS) 100 units/mL SOLN Inject 15-20 Units into the skin at bedtime.   Yes Historical Provider, MD  lisinopril (PRINIVIL,ZESTRIL) 10 MG tablet Take 1 tablet (10 mg total) by mouth daily. 09/18/12  Yes Luke K Kilroy, PA-C  metoprolol (LOPRESSOR) 50 MG tablet Take 25 mg by mouth 2 (two) times daily.   Yes Historical Provider, MD   mometasone (NASONEX) 50 MCG/ACT nasal spray Place 2 sprays into the nose daily.   Yes Historical Provider, MD  nitroGLYCERIN (NITROSTAT) 0.4 MG SL tablet Place 1 tablet (0.4 mg total) under the tongue every 5 (five) minutes x 3 doses as needed for chest pain. 09/18/12  Yes Luke K Kilroy, PA-C  pantoprazole (PROTONIX) 40 MG tablet Take 1 tablet (40 mg total) by mouth daily. 09/18/12  Yes Luke K Kilroy, PA-C  pioglitazone (ACTOS) 30 MG tablet Take 30 mg by mouth daily.   Yes Historical Provider, MD  PRODIGY LANCETS 28G MISC Use to test blood sugars dx: 250.00 04/08/13  Yes Neena Rhymes, MD  rosuvastatin (CRESTOR) 10 MG tablet Take 10 mg by mouth daily.   Yes Historical Provider, MD   Physical Exam: Filed Vitals:   11/02/13 1836  BP: 125/64  Pulse: 77  Temp: 98 F (36.7 C)  Resp: 20    BP 125/64  Pulse 77  Temp(Src) 98 F (36.7 C)  Resp 20  Ht '5\' 8"'  (1.727 m)  Wt 89.9 kg (198 lb 3.1 oz)  BMI 30.14 kg/m2  SpO2 100%  General:  Appears calm and comfortable Eyes: PERRL, normal lids, irises & conjunctiva ENT: grossly normal hearing, mild dry MM, no erythema or exudates inside his mouth Neck: no LAD, masses or thyromegaly, no JVD Cardiovascular: RRR, no r/g; positive SEM. No LE edema. Respiratory: CTA bilaterally, no w/r/r. Normal respiratory effort. Abdomen: soft, nt,nd, positive BS Skin: no rash or induration seen on exam Musculoskeletal: grossly normal tone BUE/BLE Psychiatric: grossly normal mood and affect, speech fluent and appropriate Neurologic: grossly non-focal.          Labs on Admission:  Basic Metabolic Panel:  Recent Labs Lab 11/02/13 1345 11/02/13 1510  NA 137  --   K 5.6* 4.3  CL 101  --   CO2 23  --   GLUCOSE 171*  --   BUN 17  --   CREATININE 0.98  --   CALCIUM 8.7  --    Liver Function Tests:  Recent Labs Lab 11/02/13 1345  AST 30  ALT 17  ALKPHOS 59  BILITOT 0.6  PROT 7.0  ALBUMIN 4.0   CBC:  Recent Labs Lab 11/02/13 1345  WBC  9.7  NEUTROABS 6.4  HGB 13.3  HCT 37.9*  MCV 92.7  PLT 168   Cardiac Enzymes:  Recent Labs Lab 11/02/13 1345  TROPONINI <0.30    BNP (last 3 results)  Recent Labs  11/02/13 1345  PROBNP 1351.0*     Radiological Exams on Admission: Dg Chest 2 View  11/02/2013   CLINICAL DATA:  Asthma, presyncopal episode.  EXAM: CHEST  2 VIEW  COMPARISON:  September 16, 2012  FINDINGS: The heart size  and mediastinal contours are stable. State patient is status post prior CABG. Both lungs are clear. The visualized skeletal structures are stable.  IMPRESSION: No active cardiopulmonary disease.   Electronically Signed   By: Abelardo Diesel M.D.   On: 11/02/2013 14:28   Ct Head Wo Contrast  11/02/2013   CLINICAL DATA:  Weakness and dizziness.  EXAM: CT HEAD WITHOUT CONTRAST  TECHNIQUE: Contiguous axial images were obtained from the base of the skull through the vertex without intravenous contrast.  COMPARISON:  None. Patient's head CT from May 18, 2003 is not available for comparison.  FINDINGS: There is chronic diffuse atrophy. Chronic bilateral periventricular white matter small vessel ischemic changes identified. There is no midline shift, hydrocephalus or mass. No acute hemorrhage or acute transcortical infarct is identified. The bony calvarium is intact. There is mucoperiosteal thickening of bilateral frontal, ethmoid, left sphenoid, bilateral maxillary sinuses  IMPRESSION: No focal acute intracranial abnormality identified.  Pansinusitis.   Electronically Signed   By: Abelardo Diesel M.D.   On: 11/02/2013 14:31    Assessment/Plan 1-Near syncope: with hypotension on admission. Appears to be due to orthostatic changes. Other considerations includes arrhythmias vs vertigo vs TIA -will admit to telemetry -provide fluid resuscitation -will check TSH, B12 and A1C -will cycle cardiac enzymes and will check 2 D echo -orthostatic VS in am -antihypertensive regimen reduced in half and diuretics on hold  until am -PT eval -CT head negative -if symptoms recur will get MRI brain (given normal CT head and normal neurologic exam will hold on it)  2-HYPERLIPIDEMIA: continue statins  3-BENIGN POSITIONAL VERTIGO: will ask PT to evaluate patient -PRN meclizine  4-BENIGN PROSTATIC HYPERTROPHY: continue home regimen. Patient denies urinary retention symptoms  5-DM (diabetes mellitus): continue lantus and SSI  6-ICM, EF 20-25% by cath 09/16/12- improved to 45 % by Echo 3/14 -compensated and in fact mild dehydrated on exam -daily weight,strict I's and O's -hold diuretics tonight -2D echo to be repeated  7-Sleep apnea- C-pap: to be continue QHS  8-PVD- moderate carotid disease: continue ASA and plavix. Will also continue statins -patient receving carotid US every 6 months or so as an outpatient for follow up  9-Left facial numbness: intermittent. Will check TSH and B12.   Code Status: DNR Family Communication: no family at bedside Disposition Plan: telemetry, observation; LOS < 2 midnights.  Time spent: 50 minutes  Riko Lumsden [facility] Pager [phone]

## 2013-11-02 NOTE — ED Notes (Signed)
MD at bedside. 

## 2013-11-02 NOTE — ED Notes (Signed)
Pt. Was outside doing some work on his cars and became very weak, dizzy and vomited X1.  Pt. Went into the house and felt weaker and felt like he was going to pass out.  Pt. Believes it lasted a few minutes.  Pt. Denies any chest pain or sob.  Paramedics reports that pt. 's lips were blue, they are pink presently. His BP also was 70 palpable. Presently 102/62.  Pt. Is alert and oriented X4

## 2013-11-02 NOTE — ED Notes (Signed)
Pt. Is eating dinner.  Wife at the bedside, gave her a box lunch.

## 2013-11-02 NOTE — ED Notes (Signed)
Dinner tray ordered.

## 2013-11-02 NOTE — ED Provider Notes (Signed)
CSN: [id number]     Arrival date & time 11/02/13  1322 History   First MD Initiated Contact with Patient 11/02/13 1323     Chief Complaint  Patient presents with  . Emesis   (Consider location/radiation/quality/duration/timing/severity/associated sxs/prior Treatment) Patient is a 80 y.o. male presenting with vomiting and neurologic complaint. The history is provided by the patient.  Emesis Severity:  Mild Duration: 15 minutes. Timing: once. Quality:  Stomach contents Progression:  Resolved Chronicity:  New Recent urination:  Normal Relieved by:  Nothing Worsened by:  Nothing tried Ineffective treatments:  None tried Associated symptoms: no abdominal pain, no diarrhea and no headaches   Neurologic Problem This is a new problem. Episode onset: 2-3 days ago. The problem occurs constantly. The problem has been resolved. Pertinent negatives include no chest pain, no abdominal pain, no headaches and no shortness of breath. Nothing aggravates the symptoms. Nothing relieves the symptoms. He has tried nothing for the symptoms. The treatment provided significant relief.    Past Medical History  Diagnosis Date  . Allergy   . Asthma   . CAD (coronary artery disease)     CABG '97, SVG DES 12/13  . Diabetes mellitus   . Hyperlipidemia   . BPH (benign prostatic hyperplasia)   . Colonic polyp   . History of pancreatitis   . Hearing loss   . Myocardial infarction 1997  . Acute CHF 09/15/2012  . DM (diabetes mellitus) 09/15/2012  . Ischemic cardiomyopathy 09/16/2012    EF 25%- improved to 45% 3/14  . PVD (peripheral vascular disease)     moderate bilat carotid disease  . Sleep apnea     on C-pap   Past Surgical History  Procedure Laterality Date  . Coronary artery bypass graft  1997    X 5  . Right shoulder repair    . Hernia repair  1960  . Nose surgery  2007  . Coronary angioplasty with stent placement  Dec 2013    DES to SVG-CFX/PDA   No family history on file. History   Substance Use Topics  . Smoking status: Former Smoker    Types: Cigars  . Smokeless tobacco: Not on file  . Alcohol Use: 4.2 oz/week    7 Glasses of wine per week    Review of Systems  Constitutional: Negative for fever.  HENT: Negative for drooling and rhinorrhea.   Eyes: Positive for visual disturbance. Negative for pain.  Respiratory: Negative for cough and shortness of breath.   Cardiovascular: Negative for chest pain and leg swelling.  Gastrointestinal: Positive for nausea and vomiting. Negative for abdominal pain and diarrhea.  Genitourinary: Negative for dysuria and hematuria.  Musculoskeletal: Negative for gait problem and neck pain.  Skin: Negative for color change.  Neurological: Positive for numbness. Negative for headaches.  Hematological: Negative for adenopathy.  Psychiatric/Behavioral: Negative for behavioral problems.  All other systems reviewed and are negative.    Allergies  Colesevelam and Ezetimibe-simvastatin  Home Medications   Current Outpatient Rx  Name  Route  Sig  Dispense  Refill  . acetaminophen (TYLENOL) 325 MG tablet   Oral   Take 2 tablets (650 mg total) by mouth every 4 (four) hours as needed.         Marland Kitchen ADVAIR DISKUS 100-50 MCG/DOSE AEPB      USE ONE INHALATION INTO THE LUNGS TWO TIMES DAILY   180 each   2   . aspirin EC 81 MG EC tablet   Oral  Take 2 tablets (162 mg total) by mouth daily.         . Blood Glucose Monitoring Suppl (BLOOD GLUCOSE METER) kit      Use as instructed   1 each   0     PATIENT STATES HE HAD BEEN USING PRODIGY DX: 250.0 ...   . clopidogrel (PLAVIX) 75 MG tablet      TAKE 1 TABLET DAILY   90 tablet   2   . CRESTOR 10 MG tablet      TAKE 1 TABLET DAILY   90 tablet   1   . doxazosin (CARDURA) 4 MG tablet   Oral   Take 4 mg by mouth at bedtime.           . furosemide (LASIX) 20 MG tablet   Oral   Take 1 tablet (20 mg total) by mouth daily.   30 tablet   5   . glucose blood test  strip      Use to test blood sugars   100 each   5     PRODIGY DX: 250.00   . hydroxypropyl methylcellulose (ISOPTO TEARS) 2.5 % ophthalmic solution   Both Eyes   Place 1 drop into both eyes daily as needed. For dry eyes         . insulin glargine (LANTUS SOLOSTAR) 100 UNIT/ML injection      Inject 25 units and titrate up as directed.   20 mL   1   . lisinopril (PRINIVIL,ZESTRIL) 10 MG tablet   Oral   Take 1 tablet (10 mg total) by mouth daily.   30 tablet   5   . metoprolol (LOPRESSOR) 50 MG tablet      TAKE ONE-HALF (1/2) TABLET TWICE A DAY   90 tablet   1   . Multiple Vitamin (MULTIVITAMIN) tablet   Oral   Take 1 tablet by mouth daily.           Marland Kitchen NASONEX 50 MCG/ACT nasal spray      USE 2 SPRAYS NASALLY DAILY AS DIRECTED   17 g   2   . nitroGLYCERIN (NITROSTAT) 0.4 MG SL tablet   Sublingual   Place 1 tablet (0.4 mg total) under the tongue every 5 (five) minutes x 3 doses as needed for chest pain.   25 tablet   2   . pantoprazole (PROTONIX) 40 MG tablet   Oral   Take 1 tablet (40 mg total) by mouth daily.   30 tablet   5   . pioglitazone (ACTOS) 30 MG tablet      TAKE 1 TABLET DAILY   90 tablet   1   . PRODIGY LANCETS 28G MISC      Use to test blood sugars dx: 250.00   100 each   5    There were no vitals taken for this visit. Physical Exam  Nursing note and vitals reviewed. Constitutional: He is oriented to person, place, and time. He appears well-developed and well-nourished.  HENT:  Head: Normocephalic and atraumatic.  Right Ear: External ear normal.  Left Ear: External ear normal.  Nose: Nose normal.  Mouth/Throat: Oropharynx is clear and moist. No oropharyngeal exudate.  Eyes: Conjunctivae and EOM are normal. Pupils are equal, round, and reactive to light.  Neck: Normal range of motion. Neck supple.  Cardiovascular: Normal rate, regular rhythm, normal heart sounds and intact distal pulses.  Exam reveals no gallop and no friction  rub.  No murmur heard. Pulmonary/Chest: Effort normal and breath sounds normal. No respiratory distress. He has no wheezes.  Abdominal: Soft. Bowel sounds are normal. He exhibits no distension. There is no tenderness. There is no rebound and no guarding.  Musculoskeletal: Normal range of motion. He exhibits no edema and no tenderness.  Neurological: He is alert and oriented to person, place, and time. He has normal strength. No cranial nerve deficit or sensory deficit. He displays a negative Romberg sign. Coordination and gait normal.  20/20 vision bilaterally. Normal peripheral fields.  Normal finger to nose bilaterally.  The patient is able to ambulate forwards and backwards without difficulty.  Skin: Skin is warm and dry.  Psychiatric: He has a normal mood and affect. His behavior is normal.    ED Course  Procedures (including critical care time) Labs Review Labs Reviewed  CBC WITH DIFFERENTIAL - Abnormal; Notable for the following:    RBC 4.09 (*)    HCT 37.9 (*)    Monocytes Absolute 1.2 (*)    Eosinophils Relative 7 (*)    All other components within normal limits  COMPREHENSIVE METABOLIC PANEL - Abnormal; Notable for the following:    Potassium 5.6 (*)    Glucose, Bld 171 (*)    GFR calc non Af Amer 73 (*)    GFR calc Af Amer 84 (*)    All other components within normal limits  PRO B NATRIURETIC PEPTIDE - Abnormal; Notable for the following:    Pro B Natriuretic peptide (BNP) 1351.0 (*)    All other components within normal limits  URINALYSIS W MICROSCOPIC + REFLEX CULTURE - Abnormal; Notable for the following:    APPearance HAZY (*)    Hgb urine dipstick TRACE (*)    Casts HYALINE CASTS (*)    All other components within normal limits  BASIC METABOLIC PANEL - Abnormal; Notable for the following:    Glucose, Bld 165 (*)    GFR calc non Af Amer 76 (*)    GFR calc Af Amer 88 (*)    All other components within normal limits  CBC - Abnormal; Notable for the following:     RBC 3.78 (*)    Hemoglobin 12.2 (*)    HCT 35.0 (*)    All other components within normal limits  HEMOGLOBIN A1C - Abnormal; Notable for the following:    Hemoglobin A1C 6.9 (*)    Mean Plasma Glucose 151 (*)    All other components within normal limits  GLUCOSE, CAPILLARY - Abnormal; Notable for the following:    Glucose-Capillary 169 (*)    All other components within normal limits  GLUCOSE, CAPILLARY - Abnormal; Notable for the following:    Glucose-Capillary 108 (*)    All other components within normal limits  TROPONIN I  PROTIME-INR  POTASSIUM  TSH  VITAMIN B12  TROPONIN I  TROPONIN I  TROPONIN I   Imaging Review Dg Chest 2 View  11/02/2013   CLINICAL DATA:  Asthma, presyncopal episode.  EXAM: CHEST  2 VIEW  COMPARISON:  September 16, 2012  FINDINGS: The heart size and mediastinal contours are stable. State patient is status post prior CABG. Both lungs are clear. The visualized skeletal structures are stable.  IMPRESSION: No active cardiopulmonary disease.   Electronically Signed   By: Abelardo Diesel M.D.   On: 11/02/2013 14:28   Ct Head Wo Contrast  11/02/2013   CLINICAL DATA:  Weakness and dizziness.  EXAM: CT HEAD WITHOUT CONTRAST  TECHNIQUE:  Contiguous axial images were obtained from the base of the skull through the vertex without intravenous contrast.  COMPARISON:  None. Patient's head CT from May 18, 2003 is not available for comparison.  FINDINGS: There is chronic diffuse atrophy. Chronic bilateral periventricular white matter small vessel ischemic changes identified. There is no midline shift, hydrocephalus or mass. No acute hemorrhage or acute transcortical infarct is identified. The bony calvarium is intact. There is mucoperiosteal thickening of bilateral frontal, ethmoid, left sphenoid, bilateral maxillary sinuses  IMPRESSION: No focal acute intracranial abnormality identified.  Pansinusitis.   Electronically Signed   By: Abelardo Diesel M.D.   On: 11/02/2013 14:31     EKG Interpretation    Date/Time:  Monday November 02 2013 13:29:08 EST Ventricular Rate:  71 PR Interval:  212 QRS Duration: 103 QT Interval:  440 QTC Calculation: 478 R Axis:   -33 Text Interpretation:  Sinus rhythm Borderline prolonged PR interval Left axis deviation Anterior infarct, old No significant change since last tracing Confirmed by Porscha Axley  MD, Roberta Angell ([id number]) on 11/02/2013 1:56:21 PM            MDM   1. Left facial numbness   2. Blurry vision, left eye   3. Near syncope   4. Benign paroxysmal positional vertigo   5. Cardiomyopathy, ischemic   6. DM (diabetes mellitus)   7. Hypertrophy of prostate without urinary obstruction and other lower urinary tract symptoms (LUTS)   8. PVD (peripheral vascular disease)   9. Unspecified asthma(493.90)    1:45 PM 80 y.o. male with a history of CAD s/p PCI on plavix, DM, CHF who presents with an episode of nausea. The patient states that he was working on his car this morning and was feeling tired. He came in and ate lunch. After eating lunch he used the bathroom and sat down. He noticed upon sitting down that he felt washed out and nauseous. He had an episode of emesis. He denies any chest pain or shortness of breath. He states that he is currently asymptomatic. He does note that for the last 2-3 days he has had a mild numbness sensation on the left side of his face as well as mild blurriness of vision in the left eye. He also notes occasionally hearing his heartbeat in the left ear. He denies any headache. Will get screening labwork.  Wife is now here saying that the patient was diaphoretic and near syncopal during this episode.  Will admit to [facility] for monitoring and further workup.     Blanchard Kelch, MD 11/03/13 [phone]

## 2013-11-02 NOTE — Progress Notes (Signed)
Admitting doctor paged that [name] has arrived to room 4E02 and read orders pt currently has.

## 2013-11-03 ENCOUNTER — Encounter: Payer: Self-pay | Admitting: Internal Medicine

## 2013-11-03 ENCOUNTER — Encounter (HOSPITAL_COMMUNITY): Payer: Self-pay | Admitting: General Practice

## 2013-11-03 DIAGNOSIS — E785 Hyperlipidemia, unspecified: Secondary | ICD-10-CM

## 2013-11-03 LAB — BASIC METABOLIC PANEL
BUN: 23 mg/dL (ref 6–23)
CALCIUM: 8.8 mg/dL (ref 8.4–10.5)
CO2: 25 meq/L (ref 19–32)
CREATININE: 0.89 mg/dL (ref 0.50–1.35)
Chloride: 103 mEq/L (ref 96–112)
GFR calc Af Amer: 88 mL/min — ABNORMAL LOW (ref >90)
GFR calc non Af Amer: 76 mL/min — ABNORMAL LOW (ref >90)
GLUCOSE: 165 mg/dL — AB (ref 70–99)
Potassium: 4.2 mEq/L (ref 3.7–5.3)
Sodium: 140 mEq/L (ref 137–147)

## 2013-11-03 LAB — CBC
HEMATOCRIT: 35 % — AB (ref 39.0–52.0)
Hemoglobin: 12.2 g/dL — ABNORMAL LOW (ref 13.0–17.0)
MCH: 32.3 pg (ref 26.0–34.0)
MCHC: 34.9 g/dL (ref 30.0–36.0)
MCV: 92.6 fL (ref 78.0–100.0)
PLATELETS: 169 10*3/uL (ref 150–400)
RBC: 3.78 MIL/uL — ABNORMAL LOW (ref 4.22–5.81)
RDW: 13.9 % (ref 11.5–15.5)
WBC: 7.5 10*3/uL (ref 4.0–10.5)

## 2013-11-03 LAB — TROPONIN I: Troponin I: <0.3 ng/mL (ref <0.30)

## 2013-11-03 LAB — GLUCOSE, CAPILLARY
Glucose-Capillary: 108 mg/dL — ABNORMAL HIGH (ref 70–99)
Glucose-Capillary: 144 mg/dL — ABNORMAL HIGH (ref 70–99)

## 2013-11-03 LAB — HEMOGLOBIN A1C
Hgb A1c MFr Bld: 6.9 % — ABNORMAL HIGH (ref <5.7)
Mean Plasma Glucose: 151 mg/dL — ABNORMAL HIGH (ref <117)

## 2013-11-03 LAB — TSH: TSH: 2.266 u[IU]/mL (ref 0.350–4.500)

## 2013-11-03 LAB — VITAMIN B12: VITAMIN B 12: 315 pg/mL (ref 211–911)

## 2013-11-03 MED ORDER — METOPROLOL TARTRATE 25 MG PO TABS: 12.5000 mg | ORAL_TABLET | Freq: Two times a day (BID) | ORAL | Status: DC

## 2013-11-03 MED ORDER — VITAMIN B-12 1000 MCG PO TABS
1000.0000 ug | ORAL_TABLET | Freq: Every day | ORAL | Status: DC
  Filled 2013-11-03: qty 1

## 2013-11-03 MED ORDER — LISINOPRIL 5 MG PO TABS: 5.0000 mg | ORAL_TABLET | Freq: Every day | ORAL | Status: DC

## 2013-11-03 MED ORDER — MECLIZINE HCL 25 MG PO TABS: 25.0000 mg | ORAL_TABLET | Freq: Three times a day (TID) | ORAL | Status: DC | PRN

## 2013-11-03 MED ORDER — CYANOCOBALAMIN 1000 MCG PO TABS
1000.0000 ug | ORAL_TABLET | Freq: Every day | ORAL | Status: DC
Start: 2013-11-03 — End: 2013-12-25

## 2013-11-03 MED ORDER — CYANOCOBALAMIN 1000 MCG/ML IJ SOLN
1000.0000 ug | Freq: Once | INTRAMUSCULAR | Status: AC
  Administered 2013-11-03: 1000 ug via SUBCUTANEOUS
  Filled 2013-11-03: qty 1

## 2013-11-03 NOTE — Evaluation (Signed)
Physical Therapy Evaluation/ Discharge Patient Details Name: [name] Wymore Jr. MRN: [id number] DOB: 08/23/1926 Today's Date: 11/03/2013 Time: [id number] PT Time Calculation (min): 10 min  PT Assessment / Plan / Recommendation History of Present Illness  [name] Meriweather Jr. is a 80 y.o. male with PMH as mentioned below; came to ED complaining of acute episode of dizziness/almost passing out event. Patient reports being ok this morning working on his car, after lung went to the bathroom, felt dizzy, experience one episode of vomiting and almost passed out. Per wife, patient was diaphoretic and when EMS arrived patient was hypotensive (SBP in the low 80's  Clinical Impression  Pt very pleasant and states he feels a little weak from laying in bed all day but otherwise at his baseline. Pt without vestibular symptoms of position, no nystagmus and no difficulty with head turns or bending over. Pt reports no recurrence in symptoms and no further therapy needs at this time. Will sign off due to pt at baseline status, pt aware and agreeable.     PT Assessment  Patent does not need any further PT services    Follow Up Recommendations  No PT follow up    Does the patient have the potential to tolerate intense rehabilitation      Barriers to Discharge        Equipment Recommendations  None recommended by PT    Recommendations for Other Services     Frequency      Precautions / Restrictions     Pertinent Vitals/Pain No pain VSS      Mobility  Bed Mobility Overal bed mobility: Modified Independent Transfers Overall transfer level: Modified independent Ambulation/Gait Ambulation/Gait assistance: Modified independent (Device/Increase time) Ambulation Distance (Feet): 300 Feet Assistive device: None Gait velocity interpretation: at or above normal speed for age/gender General Gait Details: pt with slight veering with head turns but able to maintain balance Stairs: Yes Stairs assistance:  Modified independent (Device/Increase time) Stair Management: One rail Right;Alternating pattern;Forwards Number of Stairs: 5    Exercises     PT Diagnosis:    PT Problem List:   PT Treatment Interventions:       PT Goals(Current goals can be found in the care plan section) Acute Rehab PT Goals PT Goal Formulation: No goals set, d/c therapy  Visit Information  Last PT Received On: 11/03/13 Assistance Needed: +1 History of Present Illness: [name] Forde Jr. is a 80 y.o. male with PMH as mentioned below; came to ED complaining of acute episode of dizziness/almost passing out event. Patient reports being ok this morning working on his car, after lung went to the bathroom, felt dizzy, experience one episode of vomiting and almost passed out. Per wife, patient was diaphoretic and when EMS arrived patient was hypotensive (SBP in the low 80's       Prior Functioning  Home Living Family/patient expects to be discharged to:: Private residence Living Arrangements: Spouse/significant other Available Help at Discharge: Family;Available 24 hours/day Type of Home: House Home Access: Stairs to enter Entergy CorporationEntrance Stairs-Number of Steps: 4 Home Layout: Two level;Able to live on main level with bedroom/bathroom Alternate Level Stairs-Number of Steps: 14 Alternate Level Stairs-Rails: Right Home Equipment: Cane - single point Prior Function Level of Independence: Independent Communication Communication: No difficulties Dominant Hand: Right    Cognition  Cognition Arousal/Alertness: Awake/alert Behavior During Therapy: WFL for tasks assessed/performed Overall Cognitive Status: Within Functional Limits for tasks assessed    Extremity/Trunk Assessment Upper Extremity Assessment Upper Extremity  Assessment: Overall WFL for tasks assessed Lower Extremity Assessment Lower Extremity Assessment: Overall WFL for tasks assessed Cervical / Trunk Assessment Cervical / Trunk Assessment: Normal    Balance    End of Session PT - End of Session Activity Tolerance: Patient tolerated treatment well Patient left: in chair;with call bell/phone within reach;with family/visitor present Nurse Communication: Mobility status  GP     Toney Sang Beth 11/03/2013, 2:20 PM Delaney Meigs, PT [phone]

## 2013-11-03 NOTE — Progress Notes (Signed)
UR completed 

## 2013-11-03 NOTE — Care Management Note (Addendum)
  Page 1 of 1   11/04/2013     12:45:38 PM   CARE MANAGEMENT NOTE 11/04/2013  Patient:  KYLIL, CHIARI   Account Number:  [id number]  Date Initiated:  11/03/2013  Documentation initiated by:  Donato Schultz  Subjective/Objective Assessment:   Admitted with Near syncope/Hypotension     Action/Plan:   CM Consult   Anticipated DC Date:  11/06/2013   Anticipated DC Plan:  HOME/SELF CARE      DC Planning Services  CM consult      Choice offered to / List presented to:  C-1 Patient           Status of service:  Completed, signed off Medicare Important Message given?   (If response is "NO", the following Medicare IM given date fields will be blank) Date Medicare IM given:   Date Additional Medicare IM given:    Discharge Disposition:    Per UR Regulation:  Reviewed for med. necessity/level of care/duration of stay  If discussed at Long Length of Stay Meetings, dates discussed:    Comments:  11/03/2013 CM Consult: From home with wife. Paitent still driving Home DME:  cane. Elects services with [facility] Provider:  [facility] if any needs at d/c. PT recs:  No PT follow up Disposition:  Home/Self Care Malakai Schoenherr RN, BSN, [place], [place] 11/03/2013

## 2013-11-03 NOTE — Discharge Summary (Signed)
Physician Discharge Summary  [name]. HBZ:[id number] DOB: 11-Dec-1925 DOA: 11/02/2013  PCP: Adella Hare, MD  Admit date: 11/02/2013 Discharge date: 11/03/2013  Time spent: >30 minutes  Recommendations for Outpatient Follow-up:  1. BMET to follow electrolytes and renal function 2. Arrange for monitor event and 2-D echo 3. Check methylmalonic acid to determine long term needs of B12 repletion 4. Reassess BP and adjust meds as needed   Discharge Diagnoses:  Principal Problem:   Near syncope Active Problems:   HYPERLIPIDEMIA   BENIGN POSITIONAL VERTIGO   BENIGN PROSTATIC HYPERTROPHY   DM (diabetes mellitus)   ICM, EF 20-25% by cath 09/16/12- improved to 45 % by Echo 3/14   Sleep apnea- C-pap   PVD- moderate carotid disease   Left facial numbness   Discharge Condition: stable and improved. Will discharge home with PCP follow up in 10 days  Diet recommendation: low sodium heart healthy and Low carb diet  Filed Weights   11/02/13 1836 11/03/13 0502  Weight: 89.9 kg (198 lb 3.1 oz) 85.1 kg (187 lb 9.8 oz)    History of present illness:  80 y.o. male with PMH as mentioned below; came to ED complaining of acute episode of dizziness/almost passing out event. Patient reports being ok this morning working on his car, after lung went to the bathroom, felt dizzy, experience one episode of vomiting and almost passed out. Per wife, patient was diaphoretic and when EMS arrived patient was hypotensive (SBP in the low 80's). Patient denies palpitations, CP, HA's, SOB, focal weakness, abd pain, melena, hematochezia or any other complaints. [facility] called to admit patient for further evaluation and treatment. BP improved with IVF's in ED.  Of note; patient reports to ED intermittent episodes of left facial numbness and blurred vision with event. Gone by time of my evaluation.   Hospital Course:  1-Near syncope: with hypotension on admission. Appears to be due to orthostatic changes. Other  considerations includes arrhythmias vs vertigo vs TIA. -no abnormalities on telemetry  -symptoms completely resolved with IVF's -no orthostatic changes at discharge -TSH WNL; B12 borderline normal, will replete and check MMA as an outpatient -CE'Z negative -will recommend repeat 2-D echo as an outpatient and also a monitor event -antihypertensive regimen reduced in half  -CT head negative  -if symptoms recur will get MRI brain (given normal CT head and normal neurologic exam will hold on it)   2-HYPERLIPIDEMIA: continue statins   3-BENIGN POSITIONAL VERTIGO:  -PRN meclizine  -no vestibular abnormalities found by PT evaluation inpatient -advise to take his time when changing positions   4-BENIGN PROSTATIC HYPERTROPHY: continue home regimen. Patient denies urinary retention symptoms   5-DM (diabetes mellitus): continue ihome insulin regimen. -A1C 6.9  6-ICM, EF 20-25% by cath 09/16/12- improved to 45 % by Echo 3/14  -compensated and in fact mild dehydrated on admission  -after hydration will resume lasix and low dose ACE -2D echo to be repeated as an outpatient  7-Sleep apnea- C-pap: to be continue QHS   8-PVD- moderate carotid disease: continue ASA and plavix. Will also continue statins.  -patient receving carotid US every 6 months or so as an outpatient for follow up   9-Left facial numbness: intermittent.  -TSH WNL -B12 borderline normal -will start PO B12 1011mg for repletion and will ask PCP to check MMA to ensure long term repletion needsWill check TSH and B12.   Procedures:  See below for x-ray reports  Consultations:  None   Discharge Exam: Filed Vitals:  11/03/13 1031  BP: 128/60  Pulse: 73  Temp:   Resp:     General: NAD, feeling great, no CP, SOB, no dizziness Cardiovascular: S1 and S2, no rubs or gallops, positive soft murmur Respiratory: CTA bilaterally Abd: soft, NT, ND, positive BS Extremities: no edema or cyanosis Neuro: non  focal  Discharge Instructions  Discharge Orders   Future Orders Complete By Expires   Discharge instructions  As directed    Comments:     Take medications as prescribed Arrange follow up with PCP in 10 days Keep yourself well hydrated Follow a low sodium diet (less than 2 Grams daily)   Increase activity slowly  As directed        Medication List         acetaminophen 325 MG tablet  Commonly known as:  TYLENOL  Take 2 tablets (650 mg total) by mouth every 4 (four) hours as needed.     aspirin 81 MG EC tablet  Take 2 tablets (162 mg total) by mouth daily.     Blood Glucose Meter kit  Use as instructed     clopidogrel 75 MG tablet  Commonly known as:  PLAVIX  Take 75 mg by mouth daily with breakfast.     cyanocobalamin 1000 MCG tablet  Take 1 tablet (1,000 mcg total) by mouth daily.     doxazosin 4 MG tablet  Commonly known as:  CARDURA  Take 4 mg by mouth at bedtime.     finasteride 5 MG tablet  Commonly known as:  PROSCAR  Take 5 mg by mouth daily.     Fluticasone-Salmeterol 100-50 MCG/DOSE Aepb  Commonly known as:  ADVAIR  Inhale 1 puff into the lungs 2 (two) times daily as needed (shortness of breath).     furosemide 20 MG tablet  Commonly known as:  LASIX  Take 20 mg by mouth daily.     glucose blood test strip  Use to test blood sugars     hydroxypropyl methylcellulose 2.5 % ophthalmic solution  Commonly known as:  ISOPTO TEARS  Place 1 drop into both eyes daily as needed. For dry eyes     insulin glargine 100 units/mL Soln  Commonly known as:  LANTUS  Inject 15-20 Units into the skin at bedtime.     lisinopril 5 MG tablet  Commonly known as:  PRINIVIL,ZESTRIL  Take 1 tablet (5 mg total) by mouth daily.     meclizine 25 MG tablet  Commonly known as:  ANTIVERT  Take 1 tablet (25 mg total) by mouth 3 (three) times daily as needed for dizziness.     metoprolol tartrate 25 MG tablet  Commonly known as:  LOPRESSOR  Take 0.5 tablets (12.5 mg  total) by mouth 2 (two) times daily.     mometasone 50 MCG/ACT nasal spray  Commonly known as:  NASONEX  Place 2 sprays into the nose daily.     nitroGLYCERIN 0.4 MG SL tablet  Commonly known as:  NITROSTAT  Place 1 tablet (0.4 mg total) under the tongue every 5 (five) minutes x 3 doses as needed for chest pain.     pantoprazole 40 MG tablet  Commonly known as:  PROTONIX  Take 1 tablet (40 mg total) by mouth daily.     pioglitazone 30 MG tablet  Commonly known as:  ACTOS  Take 30 mg by mouth daily.     PRODIGY LANCETS 28G Misc  Use to test blood sugars dx: 250.00  rosuvastatin 10 MG tablet  Commonly known as:  CRESTOR  Take 10 mg by mouth daily.       Allergies  Allergen Reactions  . Colesevelam     unknown  . Ezetimibe-Simvastatin     unknown       Follow-up Information   Follow up with Adella Hare, MD. Schedule an appointment as soon as possible for a visit in 10 days.   Specialty:  Internal Medicine   Contact information:   [address] [place] [address] [phone]       The results of significant diagnostics from this hospitalization (including imaging, microbiology, ancillary and laboratory) are listed below for reference.    Significant Diagnostic Studies: Dg Chest 2 View  11/02/2013   CLINICAL DATA:  Asthma, presyncopal episode.  EXAM: CHEST  2 VIEW  COMPARISON:  September 16, 2012  FINDINGS: The heart size and mediastinal contours are stable. State patient is status post prior CABG. Both lungs are clear. The visualized skeletal structures are stable.  IMPRESSION: No active cardiopulmonary disease.   Electronically Signed   By: Abelardo Diesel M.D.   On: 11/02/2013 14:28   Ct Head Wo Contrast  11/02/2013   CLINICAL DATA:  Weakness and dizziness.  EXAM: CT HEAD WITHOUT CONTRAST  TECHNIQUE: Contiguous axial images were obtained from the base of the skull through the vertex without intravenous contrast.  COMPARISON:  None. Patient's head CT from  May 18, 2003 is not available for comparison.  FINDINGS: There is chronic diffuse atrophy. Chronic bilateral periventricular white matter small vessel ischemic changes identified. There is no midline shift, hydrocephalus or mass. No acute hemorrhage or acute transcortical infarct is identified. The bony calvarium is intact. There is mucoperiosteal thickening of bilateral frontal, ethmoid, left sphenoid, bilateral maxillary sinuses  IMPRESSION: No focal acute intracranial abnormality identified.  Pansinusitis.   Electronically Signed   By: Abelardo Diesel M.D.   On: 11/02/2013 14:31   Labs: Basic Metabolic Panel:  Recent Labs Lab 11/02/13 1345 11/02/13 1510 11/03/13 0135  NA 137  --  140  K 5.6* 4.3 4.2  CL 101  --  103  CO2 23  --  25  GLUCOSE 171*  --  165*  BUN 17  --  23  CREATININE 0.98  --  0.89  CALCIUM 8.7  --  8.8   Liver Function Tests:  Recent Labs Lab 11/02/13 1345  AST 30  ALT 17  ALKPHOS 59  BILITOT 0.6  PROT 7.0  ALBUMIN 4.0   CBC:  Recent Labs Lab 11/02/13 1345 11/03/13 0135  WBC 9.7 7.5  NEUTROABS 6.4  --   HGB 13.3 12.2*  HCT 37.9* 35.0*  MCV 92.7 92.6  PLT 168 169   Cardiac Enzymes:  Recent Labs Lab 11/02/13 1345 11/02/13 2023 11/03/13 0135 11/03/13 0725  TROPONINI <0.30 <0.30 <0.30 <0.30   BNP: BNP (last 3 results)  Recent Labs  11/02/13 1345  PROBNP 1351.0*   CBG:  Recent Labs Lab 11/02/13 2230 11/03/13 0659 11/03/13 1107  GLUCAP 169* 108* 144*    Signed:  Tanza Pellot  [facility] 11/03/2013, 3:16 PM

## 2013-11-03 NOTE — Progress Notes (Signed)
Verbalized understanding of discharge instructions.  Denies any dizziness at this time.  Transported home by wife.  RX and discharge instructions given to husband.

## 2013-11-04 NOTE — Progress Notes (Signed)
Late entry for missed G-code based on review of PT evaluation of Maija Prater, PT.  2013/11/08 1420  PT G-Codes **NOT FOR INPATIENT CLASS**  Functional Assessment Tool Used Clinical judgment based on chart review  Functional Limitation Mobility: Walking and moving around  Mobility: Walking and Moving Around Current Status ([id number]) CH  Mobility: Walking and Moving Around Goal Status [phone]) CH  Mobility: Walking and Moving Around Discharge Status [phone]) [facility] [place], [place]  [phone] 11/04/2013

## 2013-11-09 ENCOUNTER — Inpatient Hospital Stay: Payer: Medicare Other | Admitting: Internal Medicine

## 2013-11-11 ENCOUNTER — Encounter: Payer: Self-pay | Admitting: Internal Medicine

## 2013-11-11 ENCOUNTER — Other Ambulatory Visit: Payer: Medicare Other

## 2013-11-11 ENCOUNTER — Ambulatory Visit (INDEPENDENT_AMBULATORY_CARE_PROVIDER_SITE_OTHER): Payer: Medicare Other | Admitting: Internal Medicine

## 2013-11-11 VITALS — BP 108/50 | HR 54 | Temp 97.8°F | Wt 196.0 lb

## 2013-11-11 DIAGNOSIS — I2589 Other forms of chronic ischemic heart disease: Secondary | ICD-10-CM

## 2013-11-11 DIAGNOSIS — H8110 Benign paroxysmal vertigo, unspecified ear: Secondary | ICD-10-CM

## 2013-11-11 DIAGNOSIS — R55 Syncope and collapse: Secondary | ICD-10-CM

## 2013-11-11 DIAGNOSIS — E119 Type 2 diabetes mellitus without complications: Secondary | ICD-10-CM

## 2013-11-11 DIAGNOSIS — I255 Ischemic cardiomyopathy: Secondary | ICD-10-CM

## 2013-11-11 LAB — BASIC METABOLIC PANEL
BUN: 16 mg/dL (ref 6–23)
CHLORIDE: 101 meq/L (ref 96–112)
CO2: 30 meq/L (ref 19–32)
Calcium: 9.3 mg/dL (ref 8.4–10.5)
Creatinine, Ser: 0.9 mg/dL (ref 0.4–1.5)
GFR: 90.91 mL/min (ref >60.00)
Glucose, Bld: 92 mg/dL (ref 70–99)
Potassium: 4.4 mEq/L (ref 3.5–5.1)
SODIUM: 137 meq/L (ref 135–145)

## 2013-11-11 MED ORDER — INSULIN GLARGINE 100 UNITS/ML SOLOSTAR PEN: 15.0000 [IU] | PEN_INJECTOR | Freq: Every day | SUBCUTANEOUS | Status: DC

## 2013-11-11 NOTE — Progress Notes (Signed)
Subjective:    Patient ID: Ryan Plumb., male    DOB: 10-19-1925, 80 y.o.   MRN: [id number]  HPI Sunday the 18th he had an episode of facial paresthesia left and loss of hearing left ear that lasted about an hour. Monday the 19th he had an episode of sudden on-set of Nausea with vomiting, pale and clammy, eyes rolled back, heart rate was slow and he was rocking from side to side. He did not loose consciousness. Blood sugar was checked and was normal. He called EMS and he was transported to hospital where he was admitted.   The outpatient follow-up will include methylmalonic acid level, BMet for renal function. He will benefit from an event recorder.  Past Medical History  Diagnosis Date  . Allergy   . CAD (coronary artery disease)     CABG '97, SVG DES 12/13  . Hyperlipidemia   . BPH (benign prostatic hyperplasia)   . Colonic polyp   . History of pancreatitis     pt denies this hx on 11/02/2013  . Hearing loss   . Acute CHF 09/15/2012  . Ischemic cardiomyopathy 09/16/2012    EF 25%- improved to 45% 3/14  . PVD (peripheral vascular disease)     moderate bilat carotid disease  . Myocardial infarction 1997  . Exertional shortness of breath   . Type II diabetes mellitus   . GERD (gastroesophageal reflux disease)   . Asthma     pt denies this hx on 11/02/2013  . Syncope and collapse 11/02/2013    "didn't pass out" (11/02/2013)  . Hypertension    Past Surgical History  Procedure Laterality Date  . Coronary artery bypass graft  1997    "CABG X 5"  . Shoulder open rotator cuff repair Right   . Nasal septum surgery  2007  . Cholecystectomy  2000's  . Inguinal hernia repair Bilateral ~ 1950  . Coronary angioplasty with stent placement  Dec 2013    DES to SVG-CFX/PDA  . Cataract extraction w/ intraocular lens  implant, bilateral Bilateral ~ 1970  . Retinal detachment surgery Left ?1995   History reviewed. No pertinent family history. History   Social History  . Marital  Status: Married    Spouse Name: N/A    Number of Children: N/A  . Years of Education: N/A   Occupational History  . Not on file.   Social History Main Topics  . Smoking status: Former Smoker    Types: Cigars  . Smokeless tobacco: Never Used     Comment: 11/02/2013 "smoked cigars when I was 18 or so; didn't smoke many"  . Alcohol Use: 1.2 oz/week    2 Glasses of wine per week  . Drug Use: No  . Sexual Activity: No   Other Topics Concern  . 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 acetaminophen (TYLENOL) 325 MG tablet Take 2 tablets (650 mg total) by mouth every 4 (four) hours as needed.      Marland Kitchen aspirin EC 81 MG EC tablet Take 2 tablets (162 mg total) by mouth daily.      . Blood Glucose Monitoring Suppl (BLOOD GLUCOSE METER) kit Use as  instructed  1 each  0  . clopidogrel (PLAVIX) 75 MG tablet Take 75 mg by mouth daily with breakfast.      . doxazosin (CARDURA) 4 MG tablet Take 4 mg by mouth at bedtime.        . finasteride (PROSCAR) 5 MG tablet Take 5 mg by mouth daily.      . Fluticasone-Salmeterol (ADVAIR) 100-50 MCG/DOSE AEPB Inhale 1 puff into the lungs 2 (two) times daily as needed (shortness of breath).      . furosemide (LASIX) 20 MG tablet Take 20 mg by mouth daily.      Marland Kitchen glucose blood test strip Use to test blood sugars  100 each  5  . hydroxypropyl methylcellulose (ISOPTO TEARS) 2.5 % ophthalmic solution Place 1 drop into both eyes daily as needed. For dry eyes      . insulin glargine (LANTUS) 100 units/mL SOLN Inject 15-20 Units into the skin at bedtime.      Marland Kitchen lisinopril (PRINIVIL,ZESTRIL) 5 MG tablet Take 1 tablet (5 mg total) by mouth daily.  30 tablet  1  . meclizine (ANTIVERT) 25 MG tablet Take 1 tablet (25 mg total)  by mouth 3 (three) times daily as needed for dizziness.  30 tablet  0  . metoprolol (LOPRESSOR) 25 MG tablet Take 0.5 tablets (12.5 mg total) by mouth 2 (two) times daily.  30 tablet  1  . mometasone (NASONEX) 50 MCG/ACT nasal spray Place 2 sprays into the nose daily.      . nitroGLYCERIN (NITROSTAT) 0.4 MG SL tablet Place 1 tablet (0.4 mg total) under the tongue every 5 (five) minutes x 3 doses as needed for chest pain.  25 tablet  2  . pantoprazole (PROTONIX) 40 MG tablet Take 1 tablet (40 mg total) by mouth daily.  30 tablet  5  . pioglitazone (ACTOS) 30 MG tablet Take 30 mg by mouth daily.      Marland Kitchen PRODIGY LANCETS 28G MISC Use to test blood sugars dx: 250.00  100 each  5  . rosuvastatin (CRESTOR) 10 MG tablet Take 10 mg by mouth daily.      . vitamin B-12 1000 MCG tablet Take 1 tablet (1,000 mcg total) by mouth daily.  30 tablet  1   No current facility-administered medications on file prior to visit.    Review of Systems System review is negative for any constitutional, cardiac, pulmonary, GI or neuro symptoms or complaints other than as described in the HPI.     Objective:   Physical Exam  Filed Vitals:   11/11/13 0837  BP: 108/50  Pulse: 54  Temp: 97.8 F (36.6 C)   Wt Readings from Last 3 Encounters:  11/11/13 196 lb (88.905 kg)  11/03/13 187 lb 9.8 oz (85.1 kg)  06/09/13 185 lb 9.6 oz (84.188 kg)   BP Readings from Last 3 Encounters:  11/11/13 108/50  11/03/13 128/60  06/09/13 122/58   Gen';l- WNWD man looking younger than stated age. HEENT- [place]/AT, C&S clear Cor 2+ radial pulse, no carotid bruits, quiet precordium, Regular bradycardia w/o mm/r/g PUlm - clear  Neuro - A&O x 3, speech clear, cognition and memory ok. CN II-XII normal facial synmmetry and muscle movement, PERRLA, EOMI, no visual field cuts. Hearing is slightly diminished left 128 Hz fork and finger rub, normal facial sensation to light touch and pin-prick bilaterally. Normal GU&G, normal gait, normal step  up to exam.      Assessment & Plan:  Near syncope -  The near feinting spell you had may have been a vaso-vagal episode which is a simple feint. There is also a concern that this may have been related to a slow heart rate. There is no evidence of a stroke by exam and normal CT scan. The Episode Sunday the 18th may have been a TIA. There is no value in doing an MRI since your exam is normal and you are already on a good regimen with aspirin and plavix.  Plan Will schedule you for an event recorder to be able to monitor your heart rate for several weeks. This information will be reviewed by Dr. Gwenlyn Found.  Continue on the reduced dose of metoprolol and hold the lisinopril  Call for any light-headed spell.

## 2013-11-11 NOTE — Progress Notes (Signed)
Pre visit review using our clinic review tool, if applicable. No additional management support is needed unless otherwise documented below in the visit note. 

## 2013-11-11 NOTE — Patient Instructions (Signed)
Thanks for coming to see me.  The near feinting spell you had may have been a vaso-vagal episode which is a simple feint. There is also a concern that this may have been related to a slow heart rate. There is no evidence of a stroke by exam and normal CT scan. The Episode Sunday the 18th may have been a TIA. There is no value in doing an MRI since your exam is normal and you are already on a good regimen with aspirin and plavix.  Plan Will schedule you for an event recorder to be able to monitor your heart rate for several weeks. This information will be reviewed by Dr. Allyson Sabal.  Continue on the reduced dose of metoprolol and hold the lisinopril  Call for any light-headed spell.  Positional vertigo - an old friend. Remember the rule of 20.  B12 deficiency - your B12 level was 311 (nl 211-911) - continue the B12 tablets  Diabetes - the A1C was 6.9% in the hospital - at goal. Continue your present regimen  Heart function - your last 2D echo was March '14. It will be up to Dr. Allyson Sabal as to when to repeat that study.  Thank you for allowing me to help with your care over these past years.

## 2013-11-12 MED ORDER — INSULIN GLARGINE 100 UNITS/ML SOLOSTAR PEN: 15.0000 [IU] | PEN_INJECTOR | Freq: Every day | SUBCUTANEOUS | Status: DC

## 2013-11-12 MED ORDER — FLUTICASONE-SALMETEROL 100-50 MCG/DOSE IN AEPB: 1.0000 | INHALATION_SPRAY | Freq: Two times a day (BID) | RESPIRATORY_TRACT | Status: DC | PRN

## 2013-11-13 ENCOUNTER — Encounter: Payer: Self-pay | Admitting: Internal Medicine

## 2013-11-13 LAB — METHYLMALONIC ACID, SERUM: Methylmalonic Acid, Quant: 0.11 umol/L (ref <0.40)

## 2013-11-13 NOTE — Assessment & Plan Note (Signed)
Patient with improved EF 2013 to 2014.   Plan Follow up with Dr. Allyson Sabal.

## 2013-11-13 NOTE — Assessment & Plan Note (Signed)
Lab Results  Component Value Date   HGBA1C 6.9* 11/02/2013   Good control on present regimen

## 2013-11-13 NOTE — Assessment & Plan Note (Signed)
Persistent problem but not severe.  Plan Continue to follow the "rule of 20."

## 2013-11-18 ENCOUNTER — Encounter: Payer: Self-pay | Admitting: Internal Medicine

## 2013-11-18 DIAGNOSIS — R55 Syncope and collapse: Secondary | ICD-10-CM

## 2013-11-18 DIAGNOSIS — R001 Bradycardia, unspecified: Secondary | ICD-10-CM

## 2013-11-20 NOTE — Progress Notes (Signed)
Thanks Casimiro Needle.  Christiane Ha

## 2013-11-23 ENCOUNTER — Encounter: Payer: Self-pay | Admitting: *Deleted

## 2013-11-23 ENCOUNTER — Encounter (INDEPENDENT_AMBULATORY_CARE_PROVIDER_SITE_OTHER): Payer: Medicare Other

## 2013-11-23 DIAGNOSIS — R55 Syncope and collapse: Secondary | ICD-10-CM

## 2013-11-23 DIAGNOSIS — I495 Sick sinus syndrome: Secondary | ICD-10-CM

## 2013-11-23 DIAGNOSIS — R001 Bradycardia, unspecified: Secondary | ICD-10-CM

## 2013-11-23 NOTE — Progress Notes (Signed)
Patient ID: Ryan Yuhasz., male   DOB: [birth year], 80 y.o.   MRN: [id number] E-Cardio Verite 30 day cardiac event monitor applied to patient.

## 2013-12-04 ENCOUNTER — Encounter: Payer: Self-pay | Admitting: Internal Medicine

## 2013-12-07 ENCOUNTER — Other Ambulatory Visit: Payer: Self-pay

## 2013-12-07 ENCOUNTER — Encounter: Payer: Self-pay | Admitting: Internal Medicine

## 2013-12-07 MED ORDER — INSULIN GLARGINE 100 UNITS/ML SOLOSTAR PEN: 15.0000 [IU] | PEN_INJECTOR | Freq: Every day | SUBCUTANEOUS | Status: DC

## 2013-12-07 MED ORDER — INSULIN GLARGINE 100 UNIT/ML SOLOSTAR PEN: PEN_INJECTOR | SUBCUTANEOUS | Status: DC

## 2013-12-07 MED ORDER — INSULIN PEN NEEDLE 31G X 8 MM MISC: Status: DC

## 2013-12-07 NOTE — Addendum Note (Signed)
Addended by: Lyanne Co R on: 12/07/2013 09:02 AM   Modules accepted: Orders

## 2013-12-16 ENCOUNTER — Other Ambulatory Visit: Payer: Self-pay | Admitting: Internal Medicine

## 2013-12-17 ENCOUNTER — Encounter: Payer: Self-pay | Admitting: Internal Medicine

## 2013-12-18 ENCOUNTER — Ambulatory Visit: Payer: Medicare Other | Admitting: Cardiovascular Disease

## 2013-12-25 ENCOUNTER — Encounter: Payer: Self-pay | Admitting: Cardiovascular Disease

## 2013-12-25 ENCOUNTER — Ambulatory Visit (INDEPENDENT_AMBULATORY_CARE_PROVIDER_SITE_OTHER): Payer: Medicare Other | Admitting: Cardiovascular Disease

## 2013-12-25 VITALS — BP 142/68 | HR 77 | Ht 68.0 in | Wt 191.0 lb

## 2013-12-25 DIAGNOSIS — E785 Hyperlipidemia, unspecified: Secondary | ICD-10-CM

## 2013-12-25 DIAGNOSIS — I2589 Other forms of chronic ischemic heart disease: Secondary | ICD-10-CM

## 2013-12-25 DIAGNOSIS — R55 Syncope and collapse: Secondary | ICD-10-CM

## 2013-12-25 DIAGNOSIS — I255 Ischemic cardiomyopathy: Secondary | ICD-10-CM

## 2013-12-25 DIAGNOSIS — I779 Disorder of arteries and arterioles, unspecified: Secondary | ICD-10-CM

## 2013-12-25 DIAGNOSIS — I739 Peripheral vascular disease, unspecified: Secondary | ICD-10-CM

## 2013-12-25 DIAGNOSIS — I2510 Atherosclerotic heart disease of native coronary artery without angina pectoris: Secondary | ICD-10-CM

## 2013-12-25 DIAGNOSIS — Z9889 Other specified postprocedural states: Secondary | ICD-10-CM

## 2013-12-25 DIAGNOSIS — Z95820 Peripheral vascular angioplasty status with implants and grafts: Secondary | ICD-10-CM

## 2013-12-25 DIAGNOSIS — Z79899 Other long term (current) drug therapy: Secondary | ICD-10-CM

## 2013-12-25 NOTE — Assessment & Plan Note (Signed)
Status post coronary artery bypass grafting in 1997 with LIMA to his LAD, vein to the first diagonal branch, obtuse marginal branch and PDA. Your Myoview performed 09/20/11 showed scar in the LAD territory unchanged from prior studies with an EF of 35-45%. He presented to another hospital in early December 2013 with congestive heart failure. Troponins were mildly elevated. I performed cardiac catheterization on him revealing a 75% stenosis within the proximal portion of the vein graft the distal circumflex which I stented. The remainder of his grafts were patent. It was 20-25%. Ultimately his EF improved the 40% range. He does complain of mild dyspnea but denies chest pain.

## 2013-12-25 NOTE — Progress Notes (Signed)
12/25/2013 [name].   09-30-1926  [id number]  Primary Physician Ryan Hare, MD Primary Cardiologist: Ryan Harp MD [name]   HPI:  The patient returns today for followup. He was last seen in our office by Ryan Ransom, PA-C, 73month ago and by myself one year ago..he is an 80year old married Caucasian male with a history of CAD status post coronary artery bypass grafting in 1997 with a LIMA to his LAD, a vein to a first diagonal branch, OM and PDA. His other problems include hypertension, hyperlipidemia and noninsulin-requiring diabetes. He has known peripheral vascular with moderate bilateral internal carotid artery stenosis which we are following by duplex ultrasound. He had a Myoview performed September 20, 2011, which showed scar in the LAD territory unchanged from prior studies with an EF of 35% to 45% by 2D echo. [facility] presented to [name] December with shortness of breath and congestive heart failure [facility]. [facility] did have slightly elevated troponins and catheterization revealed stenosis in his SVG to his PDA which was stented with a DES stent. His EF at that time was 20% to 25% and a LifeVest was placed. He has done well since that time and is relatively asymptomatic. Limited echo today revealed an EF in the 45% range. He did have an episode of presyncope thought to be vasovagal evaluated by his primary care physician Dr. MAdella [facility]. His beta blocker was reduced in dose. An event monitor was placed. He complains of some dyspnea but denies chest pain. He has also had some transient left facial numbness back in January but this has not recurred.     Current Outpatient Prescriptions  Medication Sig Dispense Refill  . acetaminophen (TYLENOL) 325 MG tablet Take 2 tablets (650 mg total) by mouth every 4 (four) hours as needed.      .Marland Kitchenaspirin EC 81 MG EC tablet Take 2 tablets (162 mg total) by mouth  daily.      . Blood Glucose Monitoring Suppl (BLOOD GLUCOSE METER) kit Use as instructed  1 each  0  . clopidogrel (PLAVIX) 75 MG tablet Take 75 mg by mouth daily with breakfast.      . doxazosin (CARDURA) 4 MG tablet Take 4 mg by mouth at bedtime.        . finasteride (PROSCAR) 5 MG tablet Take 5 mg by mouth daily.      . Fluticasone-Salmeterol (ADVAIR) 100-50 MCG/DOSE AEPB Inhale 1 puff into the lungs 2 (two) times daily as needed (shortness of breath).  180 each  3  . furosemide (LASIX) 20 MG tablet Take 20 mg by mouth daily.      .Marland Kitchenglucose blood test strip Use to test blood sugars  100 each  5  . hydroxypropyl methylcellulose (ISOPTO TEARS) 2.5 % ophthalmic solution Place 1 drop into both eyes daily as needed. For dry eyes      . insulin glargine (LANTUS) 100 units/mL SOLN Inject 15-20 Units into the skin at bedtime.  30 mL  3  . Insulin Pen Needle 31G X 8 MM MISC Use as directed  50 each  1  . lisinopril (PRINIVIL,ZESTRIL) 5 MG tablet Take 1 tablet (5 mg total) by mouth daily.  30 tablet  1  . metoprolol (LOPRESSOR) 25 MG tablet Take 0.5 tablets (12.5 mg total) by mouth 2 (two) times daily.  30 tablet  1  . NASONEX 50 MCG/ACT nasal spray USE  2 SPRAYS NASALLY DAILY AS DIRECTED  17 g  2  . nitroGLYCERIN (NITROSTAT) 0.4 MG SL tablet Place 1 tablet (0.4 mg total) under the tongue every 5 (five) minutes x 3 doses as needed for chest pain.  25 tablet  2  . pantoprazole (PROTONIX) 40 MG tablet Take 1 tablet (40 mg total) by mouth daily.  30 tablet  5  . pioglitazone (ACTOS) 30 MG tablet Take 30 mg by mouth daily.      Marland Kitchen PRODIGY LANCETS 28G MISC Use to test blood sugars dx: 250.00  100 each  5  . rosuvastatin (CRESTOR) 10 MG tablet Take 10 mg by mouth daily.       No current facility-administered medications for this visit.    Allergies  Allergen Reactions  . Colesevelam     unknown  . Ezetimibe-Simvastatin     unknown    History   Social History  . Marital Status: Married    Spouse  Name: N/A    Number of Children: N/A  . Years of Education: N/A   Occupational History  . Not on file.   Social History Main Topics  . Smoking status: Former Smoker    Types: Cigars  . Smokeless tobacco: Never Used     Comment: 11/02/2013 "smoked cigars when I was 18 or so; didn't smoke many"  . Alcohol Use: 1.2 oz/week    2 Glasses of wine per week  . Drug Use: No  . Sexual Activity: No   Other Topics Concern  . Not on file   Social History Narrative   Married '50-24 yrs, divorced; married '82   1 son- '51   Retired but keeps up 4 rental houses, mows   End of Life: no CPR, no heroic or futile measures     Review of Systems: General: negative for chills, fever, night sweats or weight changes.  Cardiovascular: negative for chest pain, dyspnea on exertion, edema, orthopnea, palpitations, paroxysmal nocturnal dyspnea or shortness of breath Dermatological: negative for rash Respiratory: negative for cough or wheezing Urologic: negative for hematuria Abdominal: negative for nausea, vomiting, diarrhea, bright red blood per rectum, melena, or hematemesis Neurologic: negative for visual changes, syncope, or dizziness All other systems reviewed and are otherwise negative except as noted above.    Blood pressure 142/68, pulse 77, height '5\' 8"'  (1.727 m), weight 191 lb (86.637 kg).  General appearance: alert and no distress Neck: no adenopathy, no carotid bruit, no JVD, supple, symmetrical, trachea midline and thyroid not enlarged, symmetric, no tenderness/mass/nodules Lungs: clear to auscultation bilaterally Heart: regular rate and rhythm, S1, S2 normal, no murmur, click, rub or gallop Extremities: extremities normal, atraumatic, no cyanosis or edema  EKG normal sinus rhythm at 77 with poor R-wave progression  ASSESSMENT AND PLAN:   S/P angioplasty with stent to prox SVG to LCX-PDA 09/26/12  Status post coronary artery bypass grafting in 1997 with LIMA to his LAD, vein to the  first diagonal branch, obtuse marginal branch and PDA. Your Myoview performed 09/20/11 showed scar in the LAD territory unchanged from prior studies with an EF of 35-45%. He presented to another hospital in early December 2013 with congestive heart failure. Troponins were mildly elevated. I performed cardiac catheterization on him revealing a 75% stenosis within the proximal portion of the vein graft the distal circumflex which I stented. The remainder of his grafts were patent. It was 20-25%. Ultimately his EF improved the 40% range. He does complain of mild dyspnea but denies  chest pain.  Near syncope Patient was seen by Dr. Adella [facility] . He describes symptoms compatible with a vasovagal episode. His beta blocker was adjusted and decreased. He had no further symptoms. He is currently wearing an event monitor.  HYPERLIPIDEMIA On statin therapy followed by his PCP. It appears cholesterol is over year ago. I'm going to repeat this  Carotid artery disease We've been following his carotid arteries but ultrasound. His last ultrasound performed 08/12/13 revealed moderately severe right and mild to moderate left internal carotid stenosis. His right vertebral was not visualized and is left vertebral was antegrade. This was unchanged from a prior Doppler. He did have some left facial numbness which was transient. He is on aspirin Plavix. I'm going to repeat his carotid Doppler studies.      Ryan Harp MD FACP,FACC,FAHA, [facility] 12/25/2013 10:21 AM

## 2013-12-25 NOTE — Assessment & Plan Note (Signed)
On statin therapy followed by his PCP. It appears cholesterol is over year ago. I'm going to repeat this

## 2013-12-25 NOTE — Assessment & Plan Note (Signed)
Patient was seen by Dr. Illene Regulus . He describes symptoms compatible with a vasovagal episode. His beta blocker was adjusted and decreased. He had no further symptoms. He is currently wearing an event monitor.

## 2013-12-25 NOTE — Patient Instructions (Signed)
Dr Allyson Sabal has ordered the following tests: Your physician has requested that you have a carotid duplex. This test is an ultrasound of the carotid arteries in your neck. It looks at blood flow through these arteries that supply the brain with blood. Allow one hour for this exam. There are no restrictions or special instructions.  Your physician recommends that you return for lab work in: 1-2 weeks, fasting.    We will see you back in: 6 months with Corine Shelter PA and 1 year with Dr Allyson Sabal

## 2013-12-25 NOTE — Assessment & Plan Note (Signed)
We've been following his carotid arteries but ultrasound. His last ultrasound performed 08/12/13 revealed moderately severe right and mild to moderate left internal carotid stenosis. His right vertebral was not visualized and is left vertebral was antegrade. This was unchanged from a prior Doppler. He did have some left facial numbness which was transient. He is on aspirin Plavix. I'm going to repeat his carotid Doppler studies.

## 2014-01-01 ENCOUNTER — Encounter: Payer: Self-pay | Admitting: Cardiology

## 2014-01-01 ENCOUNTER — Encounter: Payer: Self-pay | Admitting: Internal Medicine

## 2014-01-01 ENCOUNTER — Encounter: Payer: Self-pay | Admitting: Cardiovascular Disease

## 2014-01-01 MED ORDER — PIOGLITAZONE HCL 30 MG PO TABS: 30.0000 mg | ORAL_TABLET | Freq: Every day | ORAL | Status: DC

## 2014-01-01 MED ORDER — CLOPIDOGREL BISULFATE 75 MG PO TABS: 75.0000 mg | ORAL_TABLET | Freq: Every day | ORAL | Status: DC

## 2014-01-01 MED ORDER — FUROSEMIDE 20 MG PO TABS: 20.0000 mg | ORAL_TABLET | Freq: Every day | ORAL | Status: DC

## 2014-01-01 MED ORDER — PANTOPRAZOLE SODIUM 40 MG PO TBEC: 40.0000 mg | DELAYED_RELEASE_TABLET | Freq: Every day | ORAL | Status: DC

## 2014-01-05 ENCOUNTER — Ambulatory Visit (HOSPITAL_COMMUNITY)
Admission: RE | Admit: 2014-01-05 | Discharge: 2014-01-05 | Disposition: A | Discharge status: Home / Self Care | Payer: Medicare Other | Source: Ambulatory Visit | Attending: Internal Medicine | Admitting: Internal Medicine

## 2014-01-05 DIAGNOSIS — I779 Disorder of arteries and arterioles, unspecified: Secondary | ICD-10-CM

## 2014-01-05 DIAGNOSIS — I6529 Occlusion and stenosis of unspecified carotid artery: Secondary | ICD-10-CM | POA: Insufficient documentation

## 2014-01-05 DIAGNOSIS — I739 Peripheral vascular disease, unspecified: Secondary | ICD-10-CM

## 2014-01-05 LAB — LIPID PANEL
Cholesterol: 158 mg/dL (ref 0–200)
HDL: 36 mg/dL — ABNORMAL LOW (ref >39)
LDL CALC: 100 mg/dL — AB (ref 0–99)
TRIGLYCERIDES: 109 mg/dL (ref <150)
Total CHOL/HDL Ratio: 4.4 Ratio
VLDL: 22 mg/dL (ref 0–40)

## 2014-01-05 LAB — HEPATIC FUNCTION PANEL
ALK PHOS: 68 U/L (ref 39–117)
ALT: 15 U/L (ref 0–53)
AST: 19 U/L (ref 0–37)
Albumin: 4.2 g/dL (ref 3.5–5.2)
Bilirubin, Direct: 0.1 mg/dL (ref 0.0–0.3)
Indirect Bilirubin: 0.6 mg/dL (ref 0.2–1.2)
TOTAL PROTEIN: 6.4 g/dL (ref 6.0–8.3)
Total Bilirubin: 0.7 mg/dL (ref 0.2–1.2)

## 2014-01-05 NOTE — Progress Notes (Signed)
Carotid Duplex Completed. Hakeen Shipes, BS, RDMS, RVT  

## 2014-01-13 ENCOUNTER — Encounter: Payer: Self-pay | Admitting: Cardiology

## 2014-01-14 ENCOUNTER — Telehealth: Payer: Self-pay | Admitting: *Deleted

## 2014-01-14 ENCOUNTER — Encounter: Payer: Self-pay | Admitting: *Deleted

## 2014-01-14 DIAGNOSIS — Z79899 Other long term (current) drug therapy: Secondary | ICD-10-CM

## 2014-01-14 DIAGNOSIS — I6529 Occlusion and stenosis of unspecified carotid artery: Secondary | ICD-10-CM

## 2014-01-14 DIAGNOSIS — E782 Mixed hyperlipidemia: Secondary | ICD-10-CM

## 2014-01-14 MED ORDER — METOPROLOL TARTRATE 25 MG PO TABS: 12.5000 mg | ORAL_TABLET | Freq: Two times a day (BID) | ORAL | Status: DC

## 2014-01-14 MED ORDER — LISINOPRIL 5 MG PO TABS: 5.0000 mg | ORAL_TABLET | Freq: Every day | ORAL | Status: DC

## 2014-01-14 NOTE — Telephone Encounter (Signed)
Message copied by Marella Bile on Thu Jan 14, 2014 10:00 PM ------      Message from: [name]      Created: Thu Jan 14, 2014  4:51 PM      Regarding: RE: monitor       Event monitor showed NSR with PVCs.             JJB      ----- Message -----         From: Marella Bile, RN         Sent: 01/14/2014   9:10 AM           To: [name], MD      Subject: monitor                                                  Can you please look at his monitor results and let me know what you think?            Thanks!       ------

## 2014-01-14 NOTE — Telephone Encounter (Signed)
Order placed for repeat carotid dopplers in 1 year  

## 2014-01-14 NOTE — Telephone Encounter (Signed)
Message copied by Marella Bile on Thu Jan 14, 2014 11:14 PM ------      Message from: [name]      Created: Sat Jan 09, 2014  5:49 PM       Increase Crestor to 20 and recheck ------

## 2014-01-14 NOTE — Telephone Encounter (Signed)
Order placed for repeat labs in 3 months

## 2014-01-14 NOTE — Telephone Encounter (Signed)
Message copied by Marella Bile on Thu Jan 14, 2014 10:25 PM ------      Message from: [name]      Created: Sat Jan 09, 2014  5:16 PM       No change from prior study. Repeat in 12 months. ------

## 2014-01-14 NOTE — Telephone Encounter (Signed)
Results were released to patient in a my chart message.

## 2014-01-15 ENCOUNTER — Encounter: Payer: Self-pay | Admitting: Cardiovascular Disease

## 2014-01-15 MED ORDER — ROSUVASTATIN CALCIUM 10 MG PO TABS: 10.0000 mg | ORAL_TABLET | Freq: Every day | ORAL | Status: DC

## 2014-01-30 ENCOUNTER — Encounter: Payer: Self-pay | Admitting: Internal Medicine

## 2014-02-01 ENCOUNTER — Encounter: Payer: Self-pay | Admitting: Internal Medicine

## 2014-02-01 MED ORDER — GLUCOSE BLOOD VI STRP: ORAL_STRIP | Status: AC

## 2014-02-16 ENCOUNTER — Telehealth: Payer: Self-pay | Admitting: *Deleted

## 2014-02-16 ENCOUNTER — Other Ambulatory Visit: Payer: Self-pay | Admitting: *Deleted

## 2014-02-16 NOTE — Telephone Encounter (Signed)
Okay for one year  

## 2014-02-16 NOTE — Telephone Encounter (Signed)
Pt requesting nasonex , pt transferring PCP appointment Dr.PAZ 03/23/14 okay to refill?

## 2014-02-17 MED ORDER — MOMETASONE FUROATE 50 MCG/ACT NA SUSP
NASAL | Status: DC
Start: 2014-02-17 — End: 2014-05-06

## 2014-02-17 NOTE — Telephone Encounter (Signed)
rx refilled.

## 2014-02-19 ENCOUNTER — Other Ambulatory Visit: Payer: Self-pay | Admitting: Dermatology

## 2014-03-22 ENCOUNTER — Telehealth: Payer: Self-pay

## 2014-03-22 NOTE — Telephone Encounter (Signed)
Medication List and allergies:  Will bring med list to visit  90 day supply/mail order: na Local prescriptions: [facility]  Immunizations due: no shingles vaccine  A/P:   No changes to FH, PSH or Personal Hx Flu vaccine--06/2013 Tdap--2010 PNA--2002 Shingles--never had CCS--2006--unable to locate report PSA--none noted  To Discuss with Provider: Not at this time

## 2014-03-23 ENCOUNTER — Encounter: Payer: Self-pay | Admitting: Internal Medicine

## 2014-03-23 ENCOUNTER — Ambulatory Visit (INDEPENDENT_AMBULATORY_CARE_PROVIDER_SITE_OTHER): Payer: Medicare Other | Admitting: Internal Medicine

## 2014-03-23 VITALS — BP 129/69 | HR 68 | Temp 98.0°F | Ht 66.4 in | Wt 192.0 lb

## 2014-03-23 DIAGNOSIS — I2510 Atherosclerotic heart disease of native coronary artery without angina pectoris: Secondary | ICD-10-CM

## 2014-03-23 DIAGNOSIS — E785 Hyperlipidemia, unspecified: Secondary | ICD-10-CM

## 2014-03-23 DIAGNOSIS — E119 Type 2 diabetes mellitus without complications: Secondary | ICD-10-CM

## 2014-03-23 MED ORDER — INSULIN GLARGINE 100 UNIT/ML SOLOSTAR PEN: 15.0000 [IU] | PEN_INJECTOR | Freq: Every day | SUBCUTANEOUS | Status: DC

## 2014-03-23 NOTE — Assessment & Plan Note (Signed)
Chart and pertinent labs reviewed  Based on the last cholesterol panel, Crestor dose was increased, due for labs. Will return to the office fasting. See instructions

## 2014-03-23 NOTE — Assessment & Plan Note (Signed)
Seems to be stable, good compliance with medications.

## 2014-03-23 NOTE — Progress Notes (Signed)
Pre visit review using our clinic review tool, if applicable. No additional management support is needed unless otherwise documented below in the visit note. 

## 2014-03-23 NOTE — Progress Notes (Signed)
Subjective:    Patient ID: [name]., male    DOB: 1926/07/14, 80 y.o.   MRN: [id number]  DOS:  03/23/2014 Type of  Visit: New patient, transferring from Dr. Linda Hedges, chart is reviewed.   History of CAD, last cardiology note reviewed, felt to be stable, has never used nitroglycerin. Good compliance with medications Diabetes, good compliance with meds, ambulatory CBGs around 140 Asthma, one tablet as needed Hypertension, good compliance of medication, ambulatory BPs are within normal. High cholesterol, crestor dose was increased by cardiology, due for labs   ROS Denies difficulty breathing or lower extremity edema No nausea, vomiting, diarrhea or blood in the stools. No anxiety, depression.  Past Medical History  Diagnosis Date  . Allergy   . CAD (coronary artery disease)     CABG '97, SVG DES 12/13  . Hyperlipidemia   . BPH (benign prostatic hyperplasia)   . Colonic polyp   . History of pancreatitis     pt denies this hx on 11/02/2013  . Hearing loss   . Acute CHF 09/15/2012  . Ischemic cardiomyopathy 09/16/2012    EF 25%- improved to 45% 3/14  . PVD (peripheral vascular disease)     moderate bilat carotid disease  . Myocardial infarction 1997  . Exertional shortness of breath   . Type II diabetes mellitus   . GERD (gastroesophageal reflux disease)   . Asthma     pt denies this hx on 11/02/2013  . Syncope and collapse 11/02/2013    "didn't pass out" (11/02/2013)  . Hypertension     Past Surgical History  Procedure Laterality Date  . Coronary artery bypass graft  1997    "CABG X 5"  . Shoulder open rotator cuff repair Right   . Nasal septum surgery  2007  . Cholecystectomy  2000's  . Inguinal hernia repair Bilateral ~ 1950  . Coronary angioplasty with stent placement  Dec 2013    DES to SVG-CFX/PDA  . Cataract extraction w/ intraocular lens  implant, bilateral Bilateral ~ 1970  . Retinal detachment surgery Left ?1995    History   Social History  .  Marital Status: Married    Spouse Name: N/A    Number of Children: 1  . Years of Education: N/A   Occupational History  . 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 Smoking status: Former Smoker    Types: Cigars  . Smokeless tobacco: Never Used     Comment: 11/02/2013 "smoked cigars when I was 18 or so; didn't smoke many"  . Alcohol Use: 1.2 oz/week    2 Glasses of wine per week  . Drug Use: No  . Sexual Activity: No   Other Topics Concern  . 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  Always use your most recent med list.               acetaminophen 325 MG tablet  Commonly known as:  TYLENOL  Take 2 tablets (650 mg total) by mouth every 4 (four) hours as needed.     Adjustable Lancing Device  Misc     Alcohol Prep 70 % Pads     aspirin 81 MG EC tablet  Take 2 tablets (162 mg total) by mouth daily.     blood glucose meter kit and supplies  Use as instructed     clopidogrel 75 MG tablet  Commonly known as:  PLAVIX  Take 1 tablet (75 mg total) by mouth daily with breakfast.     doxazosin 4 MG tablet  Commonly known as:  CARDURA  Take 4 mg by mouth at bedtime.     finasteride 5 MG tablet  Commonly known as:  PROSCAR  Take 5 mg by mouth daily.     Fluticasone-Salmeterol 100-50 MCG/DOSE Aepb  Commonly known as:  ADVAIR  Inhale 1 puff into the lungs 2 (two) times daily as needed (shortness of breath).     furosemide 20 MG tablet  Commonly known as:  LASIX  Take 1 tablet (20 mg total) by mouth daily.     glucose blood test strip  Use to test blood sugars twice daily     hydroxypropyl methylcellulose 2.5 % ophthalmic solution  Commonly known as:  ISOPTO TEARS  Place 1 drop into both eyes daily as  needed. For dry eyes     Insulin Glargine 100 UNIT/ML Solostar Pen  Commonly known as:  LANTUS SOLOSTAR  Inject 15-20 Units into the skin daily at 10 pm.     Insulin Pen Needle 31G X 8 MM Misc  Use as directed     lisinopril 5 MG tablet  Commonly known as:  PRINIVIL,ZESTRIL  Take 1 tablet (5 mg total) by mouth daily.     metoprolol tartrate 25 MG tablet  Commonly known as:  LOPRESSOR  Take 0.5 tablets (12.5 mg total) by mouth 2 (two) times daily.     mometasone 50 MCG/ACT nasal spray  Commonly known as:  NASONEX  USE 2 SPRAYS NASALLY DAILY AS DIRECTED     nitroGLYCERIN 0.4 MG SL tablet  Commonly known as:  NITROSTAT  Place 1 tablet (0.4 mg total) under the tongue every 5 (five) minutes x 3 doses as needed for chest pain.     pantoprazole 40 MG tablet  Commonly known as:  PROTONIX  Take 1 tablet (40 mg total) by mouth daily.     pioglitazone 30 MG tablet  Commonly known as:  ACTOS  Take 1 tablet (30 mg total) by mouth daily.     PRODIGY LANCETS 28G Misc  Use to test blood sugars dx: 250.00     rosuvastatin 10 MG tablet  Commonly known as:  CRESTOR  Take 1 tablet (10 mg total) by mouth daily.           Objective:   Physical Exam BP 129/69  Pulse 68  Temp(Src) 98 F (36.7 C)  Ht 5' 6.4" (1.687 m)  Wt 192 lb (87.091 kg)  BMI 30.60 kg/m2  SpO2 97% General -- alert, well-developed, NAD.    HEENT-- Not pale.  Lungs -- normal respiratory effort, no intercostal retractions, no accessory muscle use, and normal breath sounds.  Heart-- normal rate, regular rhythm, no murmur.  Extremities-- no pretibial edema bilaterally  Neurologic--  alert & oriented X3. Speech normal, gait appropriate for age, strength symmetric and appropriate for age.  Psych-- Cognition and judgment appear intact. Cooperative with normal attention span and concentration. No anxious or depressed appearing.        Assessment & Plan:

## 2014-03-23 NOTE — Patient Instructions (Addendum)
Please schedule labs  , fasting: CMP A1c TSH --- dx diabetes FLP -- dx high cholesterol   Next visit in 4 months, please make an appointment

## 2014-03-23 NOTE — Assessment & Plan Note (Signed)
Chart and pertinent labs reviewed  Well-controlled per last A1c, due for labs. Reports he sees the eye doctor regularly. Needs a new prescription for Lantus, requests the solostar pen

## 2014-03-26 ENCOUNTER — Other Ambulatory Visit (INDEPENDENT_AMBULATORY_CARE_PROVIDER_SITE_OTHER): Payer: Medicare Other

## 2014-03-26 ENCOUNTER — Other Ambulatory Visit: Payer: Self-pay | Admitting: Internal Medicine

## 2014-03-26 DIAGNOSIS — E119 Type 2 diabetes mellitus without complications: Secondary | ICD-10-CM

## 2014-03-26 DIAGNOSIS — E7800 Pure hypercholesterolemia, unspecified: Secondary | ICD-10-CM

## 2014-03-26 LAB — LIPID PANEL
Cholesterol: 158 mg/dL (ref 0–200)
HDL: 42.3 mg/dL (ref >39.00)
LDL Cholesterol: 98 mg/dL (ref 0–99)
NONHDL: 115.7
TRIGLYCERIDES: 88 mg/dL (ref 0.0–149.0)
Total CHOL/HDL Ratio: 4
VLDL: 17.6 mg/dL (ref 0.0–40.0)

## 2014-03-26 LAB — COMPREHENSIVE METABOLIC PANEL
ALBUMIN: 4.1 g/dL (ref 3.5–5.2)
ALT: 15 U/L (ref 0–53)
AST: 18 U/L (ref 0–37)
Alkaline Phosphatase: 51 U/L (ref 39–117)
BUN: 19 mg/dL (ref 6–23)
CHLORIDE: 103 meq/L (ref 96–112)
CO2: 27 mEq/L (ref 19–32)
CREATININE: 0.8 mg/dL (ref 0.4–1.5)
Calcium: 9.3 mg/dL (ref 8.4–10.5)
GFR: 103.36 mL/min (ref >60.00)
GLUCOSE: 80 mg/dL (ref 70–99)
POTASSIUM: 4 meq/L (ref 3.5–5.1)
Sodium: 138 mEq/L (ref 135–145)
Total Bilirubin: 0.8 mg/dL (ref 0.2–1.2)
Total Protein: 7.2 g/dL (ref 6.0–8.3)

## 2014-03-26 LAB — TSH: TSH: 0.84 u[IU]/mL (ref 0.35–4.50)

## 2014-03-26 LAB — HEMOGLOBIN A1C: HEMOGLOBIN A1C: 7.1 % — AB (ref 4.6–6.5)

## 2014-05-06 ENCOUNTER — Other Ambulatory Visit: Payer: Self-pay | Admitting: *Deleted

## 2014-05-06 MED ORDER — MOMETASONE FUROATE 50 MCG/ACT NA SUSP: NASAL | Status: DC

## 2014-05-12 ENCOUNTER — Telehealth: Payer: Self-pay | Admitting: Cardiology

## 2014-05-18 NOTE — Telephone Encounter (Signed)
Closed encounter °

## 2014-05-26 ENCOUNTER — Encounter: Payer: Self-pay | Admitting: Internal Medicine

## 2014-05-26 ENCOUNTER — Ambulatory Visit (INDEPENDENT_AMBULATORY_CARE_PROVIDER_SITE_OTHER): Payer: Medicare Other | Admitting: Internal Medicine

## 2014-05-26 VITALS — BP 111/63 | HR 64 | Temp 98.2°F | Wt 190.0 lb

## 2014-05-26 DIAGNOSIS — I2510 Atherosclerotic heart disease of native coronary artery without angina pectoris: Secondary | ICD-10-CM

## 2014-05-26 DIAGNOSIS — J40 Bronchitis, not specified as acute or chronic: Secondary | ICD-10-CM

## 2014-05-26 MED ORDER — DOXYCYCLINE HYCLATE 100 MG PO TABS: 100.0000 mg | ORAL_TABLET | Freq: Two times a day (BID) | ORAL | Status: DC

## 2014-05-26 NOTE — Patient Instructions (Signed)
Rest, fluids , tylenol For cough, take Mucinex DM twice a day as needed  Continue advair   Take the antibiotic as prescribed  (doxy) Call if no better in few days Call anytime if the symptoms are severe

## 2014-05-26 NOTE — Progress Notes (Signed)
Subjective:    Patient ID: [name]., male    DOB: 05-Mar-1926, 80 y.o.   MRN: [id number]  DOS:  05/26/2014 Type of visit - description: acute, here w/  wife History: Developed a cold 3 weeks ago: Cough, sputum production, went to urgent care, was prescribed Tessalon Perles and a abx (name?), symptoms are about the same. He uses Advair regularly.  Also, 2 weeks ago noted a knot in the mouth, see physical exam   ROS No fever or chills Mild sinus pain and congestion. No difficulty breathing No nausea, vomiting, diarrhea Did have some wheezing initially but that is largely resolved  Past Medical History  Diagnosis Date  . Allergy   . CAD (coronary artery disease)     CABG '97, SVG DES 12/13  . Hyperlipidemia   . BPH (benign prostatic hyperplasia)   . Colonic polyp   . History of pancreatitis     pt denies this hx on 11/02/2013  . Hearing loss   . Acute CHF 09/15/2012  . Ischemic cardiomyopathy 09/16/2012    EF 25%- improved to 45% 3/14  . PVD (peripheral vascular disease)     moderate bilat carotid disease  . Myocardial infarction 1997  . Exertional shortness of breath   . Type II diabetes mellitus   . GERD (gastroesophageal reflux disease)   . Asthma     pt denies this hx on 11/02/2013  . Syncope and collapse 11/02/2013    "didn't pass out" (11/02/2013)  . Hypertension     Past Surgical History  Procedure Laterality Date  . Coronary artery bypass graft  1997    "CABG X 5"  . Shoulder open rotator cuff repair Right   . Nasal septum surgery  2007  . Cholecystectomy  2000's  . Inguinal hernia repair Bilateral ~ 1950  . Coronary angioplasty with stent placement  Dec 2013    DES to SVG-CFX/PDA  . Cataract extraction w/ intraocular lens  implant, bilateral Bilateral ~ 1970  . Retinal detachment surgery Left ?1995    History   Social History  . Marital Status: Married    Spouse Name: N/A    Number of Children: 1  . Years of Education: N/A   Occupational  History  . 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 Smoking status: Former Smoker    Types: Cigars  . Smokeless tobacco: Never Used     Comment: 11/02/2013 "smoked cigars when I was 18 or so; didn't smoke many"  . Alcohol Use: 1.2 oz/week    2 Glasses of wine per week  . Drug Use: No  . Sexual Activity: No   Other Topics Concern  . 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  Always use your most recent med list.               acetaminophen 325 MG tablet  Commonly known as:  TYLENOL  Take 2 tablets (650 mg total) by mouth every 4 (four) hours as needed.     Adjustable Lancing Device Misc     Alcohol Prep  70 % Pads     aspirin 81 MG EC tablet  Take 2 tablets (162 mg total) by mouth daily.     benzonatate 200 MG capsule  Commonly known as:  TESSALON     blood glucose meter kit and supplies  Use as instructed     clopidogrel 75 MG tablet  Commonly known as:  PLAVIX  Take 1 tablet (75 mg total) by mouth daily with breakfast.     doxazosin 4 MG tablet  Commonly known as:  CARDURA  Take 4 mg by mouth at bedtime.     doxycycline 100 MG tablet  Commonly known as:  VIBRA-TABS  Take 1 tablet (100 mg total) by mouth 2 (two) times daily.     finasteride 5 MG tablet  Commonly known as:  PROSCAR  Take 5 mg by mouth daily.     Fluticasone-Salmeterol 100-50 MCG/DOSE Aepb  Commonly known as:  ADVAIR  Inhale 1 puff into the lungs 2 (two) times daily as needed (shortness of breath).     furosemide 20 MG tablet  Commonly known as:  LASIX  Take 1 tablet (20 mg total) by mouth daily.     glucose blood test strip  Use to test blood sugars twice daily     hydroxypropyl methylcellulose 2.5 % ophthalmic solution    Commonly known as:  ISOPTO TEARS  Place 1 drop into both eyes daily as needed. For dry eyes     Insulin Glargine 100 UNIT/ML Solostar Pen  Commonly known as:  LANTUS SOLOSTAR  Inject 15-20 Units into the skin daily at 10 pm.     Insulin Pen Needle 31G X 8 MM Misc  Use as directed     lisinopril 5 MG tablet  Commonly known as:  PRINIVIL,ZESTRIL  Take 1 tablet (5 mg total) by mouth daily.     metoprolol tartrate 25 MG tablet  Commonly known as:  LOPRESSOR  Take 0.5 tablets (12.5 mg total) by mouth 2 (two) times daily.     mometasone 50 MCG/ACT nasal spray  Commonly known as:  NASONEX  USE 2 SPRAYS NASALLY DAILY AS DIRECTED     nitroGLYCERIN 0.4 MG SL tablet  Commonly known as:  NITROSTAT  Place 1 tablet (0.4 mg total) under the tongue every 5 (five) minutes x 3 doses as needed for chest pain.     pantoprazole 40 MG tablet  Commonly known as:  PROTONIX  Take 1 tablet (40 mg total) by mouth daily.     pioglitazone 30 MG tablet  Commonly known as:  ACTOS  Take 1 tablet (30 mg total) by mouth daily.     PRODIGY LANCETS 28G Misc  Use to test blood sugars dx: 250.00     rosuvastatin 10 MG tablet  Commonly known as:  CRESTOR  Take 1 tablet (10 mg total) by mouth daily.           Objective:   Physical Exam BP 111/63  Pulse 64  Temp(Src) 98.2 F (36.8 C)  Wt 190 lb (86.183 kg)  SpO2 98% General -- alert, well-developed, NAD.   HEENT-- Not pale. TMs normal, throat symmetric, no redness or discharge. Face symmetric, sinuses not tender to palpation. Nose not congested. At left lower gum, inner aspect he has a 3 mm bonnie, not tender enlargement, right side is flat. Lungs -- normal respiratory effort, no intercostal retractions, no accessory muscle use, and normal breath sounds.  Heart-- normal rate, regular rhythm, no murmur.  Extremities-- no pretibial edema bilaterally  Neurologic--  alert & oriented X3. Speech normal, gait appropriate for age, strength symmetric and  appropriate for age.   Psych-- Cognition and judgment appear intact. Cooperative with normal attention span and concentration. No anxious or depressed appearing.        Assessment & Plan:  Bronchitis, Continue with Advair, doxycycline for one week, and Mucinex. See instructions  Mass in the left gum, recommend observation, will recheck the area when he comes back.

## 2014-05-26 NOTE — Progress Notes (Signed)
Pre visit review using our clinic review tool, if applicable. No additional management support is needed unless otherwise documented below in the visit note. 

## 2014-06-17 ENCOUNTER — Encounter: Payer: Self-pay | Admitting: Cardiology

## 2014-06-17 ENCOUNTER — Ambulatory Visit (INDEPENDENT_AMBULATORY_CARE_PROVIDER_SITE_OTHER): Payer: Medicare Other | Admitting: Cardiology

## 2014-06-17 VITALS — BP 110/60 | HR 58 | Ht 67.0 in | Wt 190.0 lb

## 2014-06-17 DIAGNOSIS — I2510 Atherosclerotic heart disease of native coronary artery without angina pectoris: Secondary | ICD-10-CM

## 2014-06-17 DIAGNOSIS — G4730 Sleep apnea, unspecified: Secondary | ICD-10-CM

## 2014-06-17 DIAGNOSIS — I255 Ischemic cardiomyopathy: Secondary | ICD-10-CM

## 2014-06-17 DIAGNOSIS — I2589 Other forms of chronic ischemic heart disease: Secondary | ICD-10-CM

## 2014-06-17 DIAGNOSIS — Z79899 Other long term (current) drug therapy: Secondary | ICD-10-CM

## 2014-06-17 DIAGNOSIS — I739 Peripheral vascular disease, unspecified: Secondary | ICD-10-CM

## 2014-06-17 DIAGNOSIS — E782 Mixed hyperlipidemia: Secondary | ICD-10-CM

## 2014-06-17 MED ORDER — EZETIMIBE 10 MG PO TABS: 10.0000 mg | ORAL_TABLET | Freq: Every day | ORAL | Status: DC

## 2014-06-17 NOTE — Assessment & Plan Note (Signed)
No symptoms, f/u dopplers March 2016

## 2014-06-17 NOTE — Progress Notes (Signed)
06/17/2014 Al Decant Jr.   1926/10/04  [id number]  Primary [place], [place] Primary Cardiologist: Dr Gwenlyn Found  HPI:  Pleasant 80 y/o (looks younger) with a history of CABG in '97. He was admitted in December 2013 with [place]. He underwent cath and subsequent SVG-CFX/PDA DES. His LV was depressed and he went home with a Life Vest but fortunately his EF improved to 45% by echo March 2014 and Life Vest was discontinued. He is here today for a 6 month check up. He denies chest pain or SOB. No problems that he knows of with his medications. No weakness or syncope (HR 58).    Current Outpatient Prescriptions  Medication Sig Dispense Refill  . acetaminophen (TYLENOL) 325 MG tablet Take 2 tablets (650 mg total) by mouth every 4 (four) hours as needed.      . Alcohol Swabs (ALCOHOL PREP) 70 % PADS       . aspirin EC 81 MG EC tablet Take 2 tablets (162 mg total) by mouth daily.      . Blood Glucose Monitoring Suppl (BLOOD GLUCOSE METER) kit Use as instructed  1 each  0  . clopidogrel (PLAVIX) 75 MG tablet Take 1 tablet (75 mg total) by mouth daily with breakfast.  90 tablet  3  . doxazosin (CARDURA) 4 MG tablet Take 4 mg by mouth at bedtime.        . finasteride (PROSCAR) 5 MG tablet Take 5 mg by mouth daily.      . Fluticasone-Salmeterol (ADVAIR) 100-50 MCG/DOSE AEPB Inhale 1 puff into the lungs 2 (two) times daily as needed (shortness of breath).  180 each  3  . furosemide (LASIX) 20 MG tablet Take 1 tablet (20 mg total) by mouth daily.  90 tablet  3  . glucose blood test strip Use to test blood sugars twice daily  100 each  5  . hydroxypropyl methylcellulose (ISOPTO TEARS) 2.5 % ophthalmic solution Place 1 drop into both eyes daily as needed. For dry eyes      . Insulin Glargine (LANTUS SOLOSTAR) 100 UNIT/ML Solostar Pen Inject 15-20 Units into the skin daily at 10 pm.  5 pen  6  . Insulin Pen Needle 31G X 8 MM MISC Use as directed  50 each  1  . Lancet Devices (ADJUSTABLE LANCING DEVICE)  MISC       . lisinopril (PRINIVIL,ZESTRIL) 5 MG tablet Take 1 tablet (5 mg total) by mouth daily.  90 tablet  3  . metoprolol tartrate (LOPRESSOR) 25 MG tablet Take 0.5 tablets (12.5 mg total) by mouth 2 (two) times daily.  90 tablet  3  . mometasone (NASONEX) 50 MCG/ACT nasal spray USE 2 SPRAYS NASALLY DAILY AS DIRECTED  17 g  12  . nitroGLYCERIN (NITROSTAT) 0.4 MG SL tablet Place 1 tablet (0.4 mg total) under the tongue every 5 (five) minutes x 3 doses as needed for chest pain.  25 tablet  2  . pantoprazole (PROTONIX) 40 MG tablet Take 1 tablet (40 mg total) by mouth daily.  90 tablet  3  . pioglitazone (ACTOS) 30 MG tablet Take 1 tablet (30 mg total) by mouth daily.  90 tablet  3  . PRODIGY LANCETS 28G MISC Use to test blood sugars dx: 250.00  100 each  5  . rosuvastatin (CRESTOR) 10 MG tablet Take 1 tablet (10 mg total) by mouth daily.  90 tablet  3  . ezetimibe (ZETIA) 10 MG tablet Take 1 tablet (  10 mg total) by mouth daily.  90 tablet  3   No current facility-administered medications for this visit.    Allergies  Allergen Reactions  . Colesevelam     unknown  . Ezetimibe-Simvastatin     unknown    History   Social History  . Marital Status: Married    Spouse Name: N/A    Number of Children: 1  . Years of Education: N/A   Occupational History  . 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 Smoking status: Former Smoker    Types: Cigars  . Smokeless tobacco: Never Used     Comment: 11/02/2013 "smoked cigars when I was 18 or so; didn't smoke many"  . Alcohol Use: 1.2 oz/week    2 Glasses of wine per week  . Drug Use: No  . Sexual Activity: No   Other Topics Concern  . Not on file   Social History Narrative   Married '50-24 yrs, divorced; married '82   1 son- '51   Retired but keeps up 4 rental houses, mows   End of Life: no CPR, no heroic or futile measures     Review of Systems: General: negative for chills, fever, night sweats or  weight changes.  Cardiovascular: negative for chest pain, dyspnea on exertion, edema, orthopnea, palpitations, paroxysmal nocturnal dyspnea or shortness of breath Dermatological: negative for rash Respiratory: negative for cough or wheezing Urologic: negative for hematuria Abdominal: negative for nausea, vomiting, diarrhea, bright red blood per rectum, melena, or hematemesis Neurologic: negative for visual changes, syncope, or dizziness All other systems reviewed and are otherwise negative except as noted above.    Blood pressure 110/60, pulse 58, height '5\' 7"'  (1.702 m), weight 190 lb (86.183 kg).  General appearance: alert, cooperative and no distress Neck: no JVD and RCA bruit Lungs: clear to auscultation bilaterally Heart: regular rate and rhythm  EKG NSR SB, septal Qs  ASSESSMENT AND PLAN:   CORONARY ARTERY DISEASE, CABG 1997 SVG-CFX DES 12/13  ICM, EF 20-25% by cath 09/16/12- improved to 45 % by Echo 3/14 Left ventricle: Systolic function was mildly to moderately reduced. The estimated ejection fraction was in the range of 40% to 45%. Severe hypokinesis of the apical myocardium. Features are consistent with a pseudonormal left ventricular filling pattern, with concomitant abnormal relaxation and increased filling pressure (grade 2 diastolic dysfunction). Doppler parameters are consistent with elevated mean left atrial filling pressure.   HYPERLIPIDEMIA LDL 98 June 2015  PVD- moderate carotid disease No symptoms, f/u dopplers March 2016  DM (diabetes mellitus) IDDM  Sleep apnea- C-pap Compliant   PLAN  His LDL was 98 in June- goal would be 70 or less. I added Zetia 10 mg. He will follow up with Dr Gwenlyn Found in 6 months. He is due for carotid dopplers in March. He is on ASA and Plavix.   Nasha Diss KPA-C 06/17/2014 12:15 PM

## 2014-06-17 NOTE — Assessment & Plan Note (Signed)
IDDM 

## 2014-06-17 NOTE — Assessment & Plan Note (Signed)
SVG-CFX DES 12/13

## 2014-06-17 NOTE — Patient Instructions (Addendum)
START Zetia 10mg  Daily  Your physician recommends that you return for lab work in: Six months before you see Dr.Berry  Your physician wants you to follow-up in: Six months with Dr.Berry. You will receive a reminder letter in the mail two months in advance. If you don't receive a letter, please call our office to schedule the follow-up appointment.

## 2014-06-17 NOTE — Assessment & Plan Note (Signed)
Left ventricle: Systolic function was mildly to moderately reduced. The estimated ejection fraction was in the range of 40% to 45%. Severe hypokinesis of the apical myocardium. Features are consistent with a pseudonormal left ventricular filling pattern, with concomitant abnormal relaxation and increased filling pressure (grade 2 diastolic dysfunction). Doppler parameters are consistent with elevated mean left atrial filling pressure.

## 2014-06-17 NOTE — Assessment & Plan Note (Signed)
Compliant 

## 2014-06-17 NOTE — Assessment & Plan Note (Signed)
LDL 98 June 2015 

## 2014-06-18 NOTE — Addendum Note (Signed)
Addended by: Abram Sander on: 06/18/2014 12:12 PM   Modules accepted: Orders, Medications

## 2014-06-30 ENCOUNTER — Other Ambulatory Visit: Payer: Self-pay

## 2014-06-30 MED ORDER — INSULIN GLARGINE 100 UNIT/ML SOLOSTAR PEN: 15.0000 [IU] | PEN_INJECTOR | Freq: Every day | SUBCUTANEOUS | Status: DC

## 2014-07-07 ENCOUNTER — Telehealth: Payer: Self-pay

## 2014-07-07 NOTE — Telephone Encounter (Signed)
Received form from [facility], Nasonex is on manufacturer backorder, requesting substitute. Form completed by Dr. Drue Novel, and faxed back to [facility]. Form placed in scanning folder to be scanned.

## 2014-07-27 ENCOUNTER — Ambulatory Visit: Payer: Medicare Other | Admitting: Internal Medicine

## 2014-07-30 ENCOUNTER — Other Ambulatory Visit: Payer: Self-pay

## 2014-08-04 ENCOUNTER — Encounter: Payer: Self-pay | Admitting: Internal Medicine

## 2014-08-04 ENCOUNTER — Ambulatory Visit (INDEPENDENT_AMBULATORY_CARE_PROVIDER_SITE_OTHER): Payer: Medicare Other | Admitting: Internal Medicine

## 2014-08-04 VITALS — BP 112/55 | HR 61 | Temp 97.8°F | Wt 191.1 lb

## 2014-08-04 DIAGNOSIS — Z0000 Encounter for general adult medical examination without abnormal findings: Secondary | ICD-10-CM

## 2014-08-04 DIAGNOSIS — J4520 Mild intermittent asthma, uncomplicated: Secondary | ICD-10-CM

## 2014-08-04 DIAGNOSIS — Z515 Encounter for palliative care: Secondary | ICD-10-CM | POA: Insufficient documentation

## 2014-08-04 DIAGNOSIS — E1159 Type 2 diabetes mellitus with other circulatory complications: Secondary | ICD-10-CM

## 2014-08-04 DIAGNOSIS — I2510 Atherosclerotic heart disease of native coronary artery without angina pectoris: Secondary | ICD-10-CM

## 2014-08-04 DIAGNOSIS — I25810 Atherosclerosis of coronary artery bypass graft(s) without angina pectoris: Secondary | ICD-10-CM

## 2014-08-04 DIAGNOSIS — Z23 Encounter for immunization: Secondary | ICD-10-CM

## 2014-08-04 LAB — BASIC METABOLIC PANEL
BUN: 20 mg/dL (ref 6–23)
CALCIUM: 9.5 mg/dL (ref 8.4–10.5)
CHLORIDE: 104 meq/L (ref 96–112)
CO2: 22 mEq/L (ref 19–32)
Creatinine, Ser: 0.9 mg/dL (ref 0.4–1.5)
GFR: 90.76 mL/min (ref >60.00)
Glucose, Bld: 125 mg/dL — ABNORMAL HIGH (ref 70–99)
Potassium: 4.2 mEq/L (ref 3.5–5.1)
Sodium: 137 mEq/L (ref 135–145)

## 2014-08-04 LAB — HEMOGLOBIN A1C: HEMOGLOBIN A1C: 6.6 % — AB (ref 4.6–6.5)

## 2014-08-04 MED ORDER — INSULIN GLARGINE 100 UNIT/ML SOLOSTAR PEN
15.0000 [IU] | PEN_INJECTOR | Freq: Every day | SUBCUTANEOUS | Status: DC
Start: 2014-08-04 — End: 2015-01-04

## 2014-08-04 NOTE — Progress Notes (Signed)
Pre visit review using our clinic review tool, if applicable. No additional management support is needed unless otherwise documented below in the visit note. 

## 2014-08-04 NOTE — Assessment & Plan Note (Signed)
Chart is reviewed Td 2010 Pneumonia shot 2002 prevnar  today Flu shot today PSAs have been checked a urology although the patient was told  no further screening was needed Followup in 4 months for a physical

## 2014-08-04 NOTE — Patient Instructions (Signed)
Get your blood work before you leave    Please come back to the office in 4-5 months for a physical exam. Come back fasting

## 2014-08-04 NOTE — Assessment & Plan Note (Signed)
Uses  Advair as needed

## 2014-08-04 NOTE — Assessment & Plan Note (Addendum)
Compliance with Lantus and Actos, no recent ambulatory CBGs. Labs--  A1c and kidney function Follow up in 4-5 months. Last A1c was 7.1 which is satisfactory for his overall status

## 2014-08-04 NOTE — Assessment & Plan Note (Addendum)
history of CABG in '97.  December 2013 --->  [place] ---> underwent cath and subsequent SVG-CFX/PDA DES.   EF improved to 45% by echo March 2014    Currently asymptomatic, plan is continue controlling his cardiovascular risk factors

## 2014-08-04 NOTE — Progress Notes (Signed)
Subjective:    Patient ID: [name]., male    DOB: [birth year], 80 y.o.   MRN: [id number]  DOS:  08/04/2014 Type of visit - description : rov, here w/ wife Interval history: Asthma, well-controlled on Advair Diabetes, good medication compliance, no recent ambulatory CBGs, no symptoms consistent with low blood sugar Hypertension, good medication compliance, he took his BP from time to time but does not recall any specific readings. They are "usually normal"     ROS Denies chest pain or difficulty breathing No nausea, vomiting, diarrhea. No blood in the stools No dysuria,  difficulty urinating  Past Medical History  Diagnosis Date  . CAD (coronary artery disease)     CABG '97, SVG DES 12/13  . Hyperlipidemia   . BPH (benign prostatic hyperplasia)   . Colonic polyp   . History of pancreatitis     pt denies this hx on 11/02/2013  . Hearing loss   . Acute CHF 09/15/2012  . Ischemic cardiomyopathy 09/16/2012    EF 25%- improved to 45% 3/14  . PVD (peripheral vascular disease)     moderate bilat carotid disease  . Myocardial infarction 1997  . Exertional shortness of breath   . Type II diabetes mellitus   . GERD (gastroesophageal reflux disease)   . Asthma     pt denies this hx on 11/02/2013  . Syncope and collapse 11/02/2013    "didn't pass out" (11/02/2013)  . Hypertension   . 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 Coronary artery bypass graft  1997    "CABG X 5"  . Shoulder open rotator cuff repair Right   . Nasal septum surgery  2007  . Cholecystectomy  2000's  . Inguinal hernia repair Bilateral ~ 1950  . Coronary angioplasty with stent placement  Dec 2013    DES to SVG-CFX/PDA  . Cataract extraction w/ intraocular lens  implant, bilateral Bilateral ~ 1970  . Retinal detachment surgery Left ?1995    History   Social History  . Marital Status: Married   Spouse Name: N/A    Number of Children: 1  . Years of Education: N/A   Occupational History  . 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 Smoking status: Former Smoker    Types: Cigars  . Smokeless tobacco: Never Used     Comment: 11/02/2013 "smoked cigars when I was 18 or so; didn't smoke many"  . Alcohol Use: 1.2 oz/week    2 Glasses of wine per week  . Drug Use: No  . Sexual Activity: No   Other Topics Concern  . 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  Always use your most recent med list.               acetaminophen 325 MG tablet  Commonly known as:  TYLENOL  Take 2 tablets (650 mg total) by mouth every 4 (four) hours as needed.     Adjustable Lancing Device  Misc     Alcohol Prep 70 % Pads     aspirin 81 MG EC tablet  Take 2 tablets (162 mg total) by mouth daily.     blood glucose meter kit and supplies  Use as instructed     clopidogrel 75 MG tablet  Commonly known as:  PLAVIX  Take 1 tablet (75 mg total) by mouth daily with breakfast.     doxazosin 4 MG tablet  Commonly known as:  CARDURA  Take 4 mg by mouth at bedtime.     ezetimibe 10 MG tablet  Commonly known as:  ZETIA  Take 1 tablet (10 mg total) by mouth daily.     finasteride 5 MG tablet  Commonly known as:  PROSCAR  Take 5 mg by mouth daily.     Fluticasone-Salmeterol 100-50 MCG/DOSE Aepb  Commonly known as:  ADVAIR  Inhale 1 puff into the lungs 2 (two) times daily as needed (shortness of breath).     furosemide 20 MG tablet  Commonly known as:  LASIX  Take 1 tablet (20 mg total) by mouth daily.     glucose blood test strip  Use to test blood sugars twice daily     hydroxypropyl methylcellulose / hypromellose 2.5 %  ophthalmic solution  Commonly known as:  ISOPTO TEARS / GONIOVISC  Place 1 drop into both eyes daily as needed. For dry eyes     Insulin Glargine 100 UNIT/ML Solostar Pen  Commonly known as:  LANTUS SOLOSTAR  Inject 15-20 Units into the skin daily at 10 pm.     Insulin Pen Needle 31G X 8 MM Misc  Use as directed     lisinopril 5 MG tablet  Commonly known as:  PRINIVIL,ZESTRIL  Take 1 tablet (5 mg total) by mouth daily.     metoprolol tartrate 25 MG tablet  Commonly known as:  LOPRESSOR  Take 0.5 tablets (12.5 mg total) by mouth 2 (two) times daily.     mometasone 50 MCG/ACT nasal spray  Commonly known as:  NASONEX  USE 2 SPRAYS NASALLY DAILY AS DIRECTED     nitroGLYCERIN 0.4 MG SL tablet  Commonly known as:  NITROSTAT  Place 1 tablet (0.4 mg total) under the tongue every 5 (five) minutes x 3 doses as needed for chest pain.     pantoprazole 40 MG tablet  Commonly known as:  PROTONIX  Take 1 tablet (40 mg total) by mouth daily.     pioglitazone 30 MG tablet  Commonly known as:  ACTOS  Take 1 tablet (30 mg total) by mouth daily.     PRODIGY LANCETS 28G Misc  Use to test blood sugars dx: 250.00     rosuvastatin 10 MG tablet  Commonly known as:  CRESTOR  Take 1 tablet (10 mg total) by mouth daily.           Objective:   Physical Exam BP 112/55  Pulse 61  Temp(Src) 97.8 F (36.6 C) (Oral)  Wt 191 lb 2 oz (86.694 kg)  SpO2 100% General -- alert, well-developed, NAD.   HEENT-- Not pale.   Lungs -- normal respiratory effort, no intercostal retractions, no accessory muscle use, and normal breath sounds.  Heart-- normal rate, regular rhythm, soft syst murmur.   Extremities-- no pretibial edema bilaterally  Neurologic--  alert & oriented X3. Speech normal, gait appropriate for age, strength symmetric and appropriate for age.  Psych-- Cognition and judgment appear intact. Cooperative with normal attention span and concentration.  No anxious or depressed appearing.         Assessment & Plan:

## 2014-09-04 ENCOUNTER — Encounter: Payer: Self-pay | Admitting: Internal Medicine

## 2014-09-06 ENCOUNTER — Other Ambulatory Visit: Payer: Self-pay

## 2014-09-06 MED ORDER — MOMETASONE FUROATE 50 MCG/ACT NA SUSP: NASAL | Status: DC

## 2014-09-23 ENCOUNTER — Encounter (HOSPITAL_COMMUNITY): Payer: Self-pay | Admitting: Cardiovascular Disease

## 2014-10-22 ENCOUNTER — Ambulatory Visit: Payer: Medicare Other | Admitting: Internal Medicine

## 2014-10-22 ENCOUNTER — Ambulatory Visit (INDEPENDENT_AMBULATORY_CARE_PROVIDER_SITE_OTHER): Payer: Medicare Other | Admitting: Physician Assistant

## 2014-10-22 ENCOUNTER — Encounter: Payer: Self-pay | Admitting: Physician Assistant

## 2014-10-22 VITALS — BP 146/65 | HR 70 | Temp 98.0°F | Resp 16 | Ht 67.0 in | Wt 197.2 lb

## 2014-10-22 DIAGNOSIS — J209 Acute bronchitis, unspecified: Secondary | ICD-10-CM

## 2014-10-22 MED ORDER — AMOXICILLIN 500 MG PO CAPS: 500.0000 mg | ORAL_CAPSULE | Freq: Three times a day (TID) | ORAL | Status: DC

## 2014-10-22 MED ORDER — ALBUTEROL SULFATE HFA 108 (90 BASE) MCG/ACT IN AERS: 2.0000 | INHALATION_SPRAY | Freq: Four times a day (QID) | RESPIRATORY_TRACT | Status: DC | PRN

## 2014-10-22 MED ORDER — HYDROCOD POLST-CHLORPHEN POLST 10-8 MG/5ML PO LQCR: 5.0000 mL | Freq: Two times a day (BID) | ORAL | Status: DC | PRN

## 2014-10-22 NOTE — Progress Notes (Signed)
Pre visit review using our clinic review tool, if applicable. No additional management support is needed unless otherwise documented below in the visit note/SLS  

## 2014-10-22 NOTE — Patient Instructions (Signed)
Please take antibiotic as directed. Increase fluids.  Get plenty of rest.  Continue the ADdair.  Use the Albuterol  Inhaler as directed, if needed for chest tightness.  Start some plain Mucinex.  Use the cough syrup at nighttime to help with cough and restful sleep.  Call or return to clinic if symptoms are not improving.

## 2014-10-22 NOTE — Progress Notes (Signed)
Patient presents to clinic today c/o worsening cough that is productive of green sputum associated with fatigue and some mild SOB during coughing spells.  Symptoms have been present for 1 week and worsening over the few days.  Patient denies fever, chest pain, recent travel or sick contact.  Has history of asthma, seasonal.   Has not taken anything for symptoms.  Past Medical History  Diagnosis Date  . CAD (coronary artery disease)     CABG '97, SVG DES 12/13  . Hyperlipidemia   . BPH (benign prostatic hyperplasia)   . Colonic polyp   . History of pancreatitis     pt denies this hx on 11/02/2013  . Hearing loss   . Acute CHF 09/15/2012  . Ischemic cardiomyopathy 09/16/2012    EF 25%- improved to 45% 3/14  . PVD (peripheral vascular disease)     moderate bilat carotid disease  . Myocardial infarction 1997  . Exertional shortness of breath   . Type II diabetes mellitus   . GERD (gastroesophageal reflux disease)   . Asthma     pt denies this hx on 11/02/2013  . Syncope and collapse 11/02/2013    "didn't pass out" (11/02/2013)  . Hypertension   . Intrinsic asthma 06/18/2007    Qualifier: Diagnosis of  By: Dance CMA (AAMA), Kim      Current Outpatient Prescriptions on File Prior to Visit  Medication Sig Dispense Refill  . acetaminophen (TYLENOL) 325 MG tablet Take 2 tablets (650 mg total) by mouth every 4 (four) hours as needed.    . Alcohol Swabs (ALCOHOL PREP) 70 % PADS     . aspirin EC 81 MG EC tablet Take 2 tablets (162 mg total) by mouth daily.    . Blood Glucose Monitoring Suppl (BLOOD GLUCOSE METER) kit Use as instructed 1 each 0  . clopidogrel (PLAVIX) 75 MG tablet Take 1 tablet (75 mg total) by mouth daily with breakfast. 90 tablet 3  . doxazosin (CARDURA) 4 MG tablet Take 4 mg by mouth at bedtime.      Marland Kitchen ezetimibe (ZETIA) 10 MG tablet Take 1 tablet (10 mg total) by mouth daily. 90 tablet 3  . finasteride (PROSCAR) 5 MG tablet Take 5 mg by mouth daily.    .  Fluticasone-Salmeterol (ADVAIR) 100-50 MCG/DOSE AEPB Inhale 1 puff into the lungs 2 (two) times daily as needed (shortness of breath). 180 each 3  . furosemide (LASIX) 20 MG tablet Take 1 tablet (20 mg total) by mouth daily. 90 tablet 3  . glucose blood test strip Use to test blood sugars twice daily 100 each 5  . hydroxypropyl methylcellulose (ISOPTO TEARS) 2.5 % ophthalmic solution Place 1 drop into both eyes daily as needed. For dry eyes    . Insulin Glargine (LANTUS SOLOSTAR) 100 UNIT/ML Solostar Pen Inject 15-20 Units into the skin daily at 10 pm. 15 pen 1  . Insulin Pen Needle 31G X 8 MM MISC Use as directed 50 each 1  . Lancet Devices (ADJUSTABLE LANCING DEVICE) MISC     . lisinopril (PRINIVIL,ZESTRIL) 5 MG tablet Take 1 tablet (5 mg total) by mouth daily. 90 tablet 3  . metoprolol tartrate (LOPRESSOR) 25 MG tablet Take 0.5 tablets (12.5 mg total) by mouth 2 (two) times daily. 90 tablet 3  . mometasone (NASONEX) 50 MCG/ACT nasal spray USE 2 SPRAYS NASALLY DAILY AS DIRECTED 17 g 4  . nitroGLYCERIN (NITROSTAT) 0.4 MG SL tablet Place 1 tablet (0.4 mg total) under the  tongue every 5 (five) minutes x 3 doses as needed for chest pain. 25 tablet 2  . pantoprazole (PROTONIX) 40 MG tablet Take 1 tablet (40 mg total) by mouth daily. 90 tablet 3  . pioglitazone (ACTOS) 30 MG tablet Take 1 tablet (30 mg total) by mouth daily. 90 tablet 3  . PRODIGY LANCETS 28G MISC Use to test blood sugars dx: 250.00 100 each 5  . rosuvastatin (CRESTOR) 10 MG tablet Take 1 tablet (10 mg total) by mouth daily. 90 tablet 3   No current facility-administered medications on file prior to visit.    Allergies  Allergen Reactions  . Colesevelam     unknown  . Ezetimibe-Simvastatin     unknown    No family history on file.  History   Social History  . Marital Status: Married    Spouse Name: N/A    Number of Children: 1  . Years of Education: N/A   Occupational History  . 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 Smoking status: Former Smoker    Types: Cigars  . Smokeless tobacco: Never Used     Comment: 11/02/2013 "smoked cigars when I was 18 or so; didn't smoke many"  . Alcohol Use: 1.2 oz/week    2 Glasses of wine per week  . Drug Use: No  . Sexual Activity: No   Other Topics Concern  . 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  All other ROS are negative.  BP 146/65 mmHg  Pulse 70  Temp(Src) 98 F (36.7 C) (Oral)  Resp 16  Ht '5\' 7"'  (1.702 m)  Wt 197 lb 4 oz (89.472 kg)  BMI 30.89 kg/m2  SpO2 95%  Physical Exam  Constitutional: He is oriented to person, place, and time and well-developed, well-nourished, and in no distress.  HENT:  Head: Normocephalic and atraumatic.  Right Ear: External ear normal.  Left Ear: External ear normal.  Nose: Nose normal.  Mouth/Throat: Oropharynx is clear and moist. No oropharyngeal exudate.  TM within normal limits bilaterally.  Eyes: Conjunctivae are normal. Pupils are equal, round, and reactive to light.  Neck: Neck supple.  Cardiovascular: Normal rate, regular rhythm, normal heart sounds and intact distal pulses.   Pulmonary/Chest: Effort normal and breath sounds normal. No respiratory distress. He has no wheezes. He has no rales. He exhibits no tenderness.  Lymphadenopathy:    He has no cervical adenopathy.  Neurological: He is alert and oriented to person, place, and time.  Skin: Skin is warm and dry. No rash noted.  Psychiatric: Affect normal.  Vitals reviewed.   Recent Results (from the past 2160 hour(s))  Basic metabolic panel     Status: Abnormal   Collection Time: 08/04/14  9:04 AM  Result Value Ref Range   Sodium 137 135 - 145 mEq/L   Potassium 4.2 3.5 - 5.1 mEq/L   Chloride 104 96 - 112 mEq/L   CO2 22 19 - 32 mEq/L     Glucose, Bld 125 (H) 70 - 99 mg/dL   BUN 20 6 - 23 mg/dL   Creatinine, Ser 0.9 0.4 - 1.5 mg/dL   Calcium 9.5 8.4 - 10.5 mg/dL   GFR 90.76 >60.00  mL/min  Hemoglobin A1c     Status: Abnormal   Collection Time: 08/04/14  9:04 AM  Result Value Ref Range   Hgb A1c MFr Bld 6.6 (H) 4.6 - 6.5 %    Comment: Glycemic Control Guidelines for People with Diabetes:Non Diabetic:  <6%Goal of Therapy: <7%Additional Action Suggested:  >8%    Assessment/Plan: Acute bronchitis Rx Amoxicillin.  Continue Advair as directed. Stay well hydrated. Will Rx Albuterol rescue inhaler to use as directed. Plain Mucinex.  Return precautions discussed with patient and wife.

## 2014-10-24 DIAGNOSIS — J209 Acute bronchitis, unspecified: Secondary | ICD-10-CM | POA: Insufficient documentation

## 2014-10-24 NOTE — Assessment & Plan Note (Signed)
Rx Amoxicillin.  Continue Advair as directed. Stay well hydrated. Will Rx Albuterol rescue inhaler to use as directed. Plain Mucinex.  Return precautions discussed with patient and wife.

## 2014-10-25 ENCOUNTER — Encounter: Payer: Self-pay | Admitting: Cardiology

## 2014-10-25 MED ORDER — EZETIMIBE 10 MG PO TABS: 10.0000 mg | ORAL_TABLET | Freq: Every day | ORAL | Status: DC

## 2014-11-01 ENCOUNTER — Encounter: Payer: Self-pay | Admitting: Internal Medicine

## 2014-11-01 ENCOUNTER — Other Ambulatory Visit: Payer: Self-pay

## 2014-11-01 MED ORDER — PIOGLITAZONE HCL 30 MG PO TABS: 30.0000 mg | ORAL_TABLET | Freq: Every day | ORAL | Status: DC

## 2014-11-09 ENCOUNTER — Ambulatory Visit: Payer: Medicare Other | Admitting: Physician Assistant

## 2014-12-17 ENCOUNTER — Encounter: Payer: Self-pay | Admitting: Internal Medicine

## 2014-12-17 ENCOUNTER — Other Ambulatory Visit: Payer: Self-pay

## 2014-12-17 MED ORDER — FLUTICASONE-SALMETEROL 100-50 MCG/DOSE IN AEPB: 1.0000 | INHALATION_SPRAY | Freq: Two times a day (BID) | RESPIRATORY_TRACT | Status: DC | PRN

## 2015-01-03 ENCOUNTER — Telehealth: Payer: Self-pay

## 2015-01-03 NOTE — Addendum Note (Signed)
Addended by: Wende Mott on: 01/03/2015 01:59 PM   Modules accepted: Medications

## 2015-01-03 NOTE — Telephone Encounter (Signed)
See speciality notes 

## 2015-01-04 ENCOUNTER — Ambulatory Visit (INDEPENDENT_AMBULATORY_CARE_PROVIDER_SITE_OTHER): Payer: Medicare Other | Admitting: Internal Medicine

## 2015-01-04 ENCOUNTER — Encounter: Payer: Self-pay | Admitting: Internal Medicine

## 2015-01-04 VITALS — BP 124/64 | HR 66 | Temp 97.6°F | Ht 67.0 in | Wt 198.4 lb

## 2015-01-04 DIAGNOSIS — I25810 Atherosclerosis of coronary artery bypass graft(s) without angina pectoris: Secondary | ICD-10-CM

## 2015-01-04 DIAGNOSIS — E785 Hyperlipidemia, unspecified: Secondary | ICD-10-CM | POA: Diagnosis not present

## 2015-01-04 DIAGNOSIS — Z0000 Encounter for general adult medical examination without abnormal findings: Secondary | ICD-10-CM

## 2015-01-04 DIAGNOSIS — E1159 Type 2 diabetes mellitus with other circulatory complications: Secondary | ICD-10-CM | POA: Diagnosis not present

## 2015-01-04 LAB — CBC WITH DIFFERENTIAL/PLATELET
Basophils Absolute: 0.1 10*3/uL (ref 0.0–0.1)
Basophils Relative: 1.3 % (ref 0.0–3.0)
Eosinophils Absolute: 1.3 10*3/uL — ABNORMAL HIGH (ref 0.0–0.7)
HEMATOCRIT: 37.8 % — AB (ref 39.0–52.0)
Hemoglobin: 12.9 g/dL — ABNORMAL LOW (ref 13.0–17.0)
LYMPHS ABS: 1.2 10*3/uL (ref 0.7–4.0)
Lymphocytes Relative: 22.1 % (ref 12.0–46.0)
MCHC: 34.1 g/dL (ref 30.0–36.0)
MCV: 91.5 fl (ref 78.0–100.0)
MONO ABS: 0.8 10*3/uL (ref 0.1–1.0)
Monocytes Relative: 14.1 % — ABNORMAL HIGH (ref 3.0–12.0)
Neutro Abs: 2.2 10*3/uL (ref 1.4–7.7)
Neutrophils Relative %: 39 % — ABNORMAL LOW (ref 43.0–77.0)
Platelets: 158 10*3/uL (ref 150.0–400.0)
RBC: 4.13 Mil/uL — AB (ref 4.22–5.81)
RDW: 14.2 % (ref 11.5–15.5)
WBC: 5.6 10*3/uL (ref 4.0–10.5)

## 2015-01-04 LAB — AST: AST: 18 U/L (ref 0–37)

## 2015-01-04 LAB — LIPID PANEL
CHOL/HDL RATIO: 3
Cholesterol: 117 mg/dL (ref 0–200)
HDL: 43.6 mg/dL (ref >39.00)
LDL Cholesterol: 62 mg/dL (ref 0–99)
NONHDL: 73.4
TRIGLYCERIDES: 59 mg/dL (ref 0.0–149.0)
VLDL: 11.8 mg/dL (ref 0.0–40.0)

## 2015-01-04 LAB — BASIC METABOLIC PANEL
BUN: 16 mg/dL (ref 6–23)
CO2: 31 mEq/L (ref 19–32)
CREATININE: 0.78 mg/dL (ref 0.40–1.50)
Calcium: 9.2 mg/dL (ref 8.4–10.5)
Chloride: 102 mEq/L (ref 96–112)
GFR: 100.12 mL/min (ref >60.00)
Glucose, Bld: 87 mg/dL (ref 70–99)
Potassium: 3.9 mEq/L (ref 3.5–5.1)
Sodium: 137 mEq/L (ref 135–145)

## 2015-01-04 LAB — ALT: ALT: 13 U/L (ref 0–53)

## 2015-01-04 LAB — HEMOGLOBIN A1C: Hgb A1c MFr Bld: 6.5 % (ref 4.6–6.5)

## 2015-01-04 NOTE — Assessment & Plan Note (Signed)
Continue Zetia and Crestor, check FLP and LFTs

## 2015-01-04 NOTE — Assessment & Plan Note (Addendum)
Td 2010 Pneumonia shot 2002 prevnar  2015  zostavax 2012 PSAs have been checked by urology     Had a cscope ~ 10 years ago, discussed screening, we agreed no further screenings

## 2015-01-04 NOTE — Patient Instructions (Signed)
Get your blood work before you leave   Lantus: Take only 7 units at bedtime  Diabetes: Check your blood sugar  once or twice a day     GOALS: Fasting before a meal 110- 130 2 hours after a meal less than 180 At bedtime 110-150 Call  with readings in one month l  Come back to the office in 2 months   for a routine check up     Fall Prevention and Home Safety Falls cause injuries and can affect all age groups. It is possible to use preventive measures to significantly decrease the likelihood of falls. There are many simple measures which can make your home safer and prevent falls. OUTDOORS  Repair cracks and edges of walkways and driveways.  Remove high doorway thresholds.  Trim shrubbery on the main path into your home.  Have good outside lighting.  Clear walkways of tools, rocks, debris, and clutter.  Check that handrails are not broken and are securely fastened. Both sides of steps should have handrails.  Have leaves, snow, and ice cleared regularly.  Use sand or salt on walkways during winter months.  In the garage, clean up grease or oil spills. BATHROOM  Install night lights.  Install grab bars by the toilet and in the tub and shower.  Use non-skid mats or decals in the tub or shower.  Place a plastic non-slip stool in the shower to sit on, if needed.  Keep floors dry and clean up all water on the floor immediately.  Remove soap buildup in the tub or shower on a regular basis.  Secure bath mats with non-slip, double-sided rug tape.  Remove throw rugs and tripping hazards from the floors. BEDROOMS  Install night lights.  Make sure a bedside light is easy to reach.  Do not use oversized bedding.  Keep a telephone by your bedside.  Have a firm chair with side arms to use for getting dressed.  Remove throw rugs and tripping hazards from the floor. KITCHEN  Keep handles on pots and pans turned toward the center of the stove. Use back burners when  possible.  Clean up spills quickly and allow time for drying.  Avoid walking on wet floors.  Avoid hot utensils and knives.  Position shelves so they are not too high or low.  Place commonly used objects within easy reach.  If necessary, use a sturdy step stool with a grab bar when reaching.  Keep electrical cables out of the way.  Do not use floor polish or wax that makes floors slippery. If you must use wax, use non-skid floor wax.  Remove throw rugs and tripping hazards from the floor. STAIRWAYS  Never leave objects on stairs.  Place handrails on both sides of stairways and use them. Fix any loose handrails. Make sure handrails on both sides of the stairways are as long as the stairs.  Check carpeting to make sure it is firmly attached along stairs. Make repairs to worn or loose carpet promptly.  Avoid placing throw rugs at the top or bottom of stairways, or properly secure the rug with carpet tape to prevent slippage. Get rid of throw rugs, if possible.  Have an electrician put in a light switch at the top and bottom of the stairs. OTHER FALL PREVENTION TIPS  Wear low-heel or rubber-soled shoes that are supportive and fit well. Wear closed toe shoes.  When using a stepladder, make sure it is fully opened and both spreaders are firmly locked.  Do not climb a closed stepladder.  Add color or contrast paint or tape to grab bars and handrails in your home. Place contrasting color strips on first and last steps.  Learn and use mobility aids as needed. Install an electrical emergency response system.  Turn on lights to avoid dark areas. Replace light bulbs that burn out immediately. Get light switches that glow.  Arrange furniture to create clear pathways. Keep furniture in the same place.  Firmly attach carpet with non-skid or double-sided tape.  Eliminate uneven floor surfaces.  Select a carpet pattern that does not visually hide the edge of steps.  Be aware of all  pets. OTHER HOME SAFETY TIPS  Set the water temperature for 120 F (48.8 C).  Keep emergency numbers on or near the telephone.  Keep smoke detectors on every level of the home and near sleeping areas. Document Released: 09/21/2002 Document Revised: 04/01/2012 Document Reviewed: 12/21/2011 [facility] Patient Information 2015 [place], [place]. This information is not intended to replace advice given to you by your health care provider. Make sure you discuss any questions you have with your health care provider.   Preventive Care for Adults Ages 51 and over  Blood pressure check.** / Every 1 to 2 years.  Lipid and cholesterol check.**/ Every 5 years beginning at age 52.  Lung cancer screening. / Every year if you are aged 5-80 years and have a 30-pack-year history of smoking and currently smoke or have quit within the past 15 years. Yearly screening is stopped once you have quit smoking for at least 15 years or develop a health problem that would prevent you from having lung cancer treatment.  Fecal occult blood test (FOBT) of stool. / Every year beginning at age 44 and continuing until age 40. You may not have to do this test if you get a colonoscopy every 10 years.  Flexible sigmoidoscopy** or colonoscopy.** / Every 5 years for a flexible sigmoidoscopy or every 10 years for a colonoscopy beginning at age 44 and continuing until age 34.  Hepatitis C blood test.** / For all people born from [birth year] through 1963 and any individual with known risks for hepatitis C.  Abdominal aortic aneurysm (AAA) screening.** / A one-time screening for ages 56 to 38 years who are current or former smokers.  Skin self-exam. / Monthly.  Influenza vaccine. / Every year.  Tetanus, diphtheria, and acellular pertussis (Tdap/Td) vaccine.** / 1 dose of Td every 10 years.  Varicella vaccine.** / Consult your health care provider.  Zoster vaccine.** / 1 dose for adults aged 59 years or older.  Pneumococcal  13-valent conjugate (PCV13) vaccine.** / Consult your health care provider.  Pneumococcal polysaccharide (PPSV23) vaccine.** / 1 dose for all adults aged 68 years and older.  Meningococcal vaccine.** / Consult your health care provider.  Hepatitis A vaccine.** / Consult your health care provider.  Hepatitis B vaccine.** / Consult your health care provider.  Haemophilus influenzae type b (Hib) vaccine.** / Consult your health care provider. **Family history and personal history of risk and conditions may change your health care provider's recommendations. Document Released: 11/27/2001 Document Revised: 10/06/2013 Document Reviewed: 02/26/2011 [facility] Patient Information 2015 [place], [place]. This information is not intended to replace advice given to you by your health care provider. Make sure you discuss any questions you have with your health care provider.

## 2015-01-04 NOTE — Progress Notes (Signed)
Subjective:    Patient ID: Ryan Plumb., male    DOB: 26-Oct-1925, 81 y.o.   MRN: [id number]  DOS:  01/04/2015 Type of visit - description :  Here for Medicare AWV:  1. Risk factors based on Past M, S, F history: reviewed 2. Physical Activities:  Very active w/ house chores  3. Depression/mood: neg screening  4. Hearing:  Has hearing aids   5. ADL's: independent, drives  6. Fall Risk: no recent falls, prevention discussed  7. home Safety: does feel safe at home  8. Height, weight, & visual acuity: see VS, sees eye doctor regulalrly 9. Counseling: provided 10. Labs ordered based on risk factors: if needed  11. Referral Coordination: if needed 12. Care Plan, see assessment and plan , written personalized plan provided  13. Cognitive Assessment: motor skills and cognition appropriate for age 35. Care team updated 15. End-of-life care, discuss, he already had a conversation with his family  In addition, today we discussed the following:  CAD, good compliance of medication, does have nitroglycerin but doesn't use it. COPD, good compliance with medication, hardly ever uses albuterol Diabetes, somehow vague about his ambulatory blood sugars. He takes some insulin at nighttime, 15 u?  "Depends on my blood sugar". 4 weeks ago went to a ER complaining of slurred speech and diplopia, symptoms were felt to be from hypoglycemia. High cholesterol, good compliance with medication. Hypertension, BP today is great, ambulatory BPs are "normal", could not give me actual readings   Review of Systems Constitutional: No fever, chills. No unexplained wt changes. No unusual sweats HEENT: No dental problems, ear discharge, facial swelling, voice changes. No eye discharge, redness or intolerance to light Respiratory: No wheezing or difficulty breathing. No cough , mucus production Cardiovascular: No CP, leg swelling or palpitations GI: no nausea, vomiting, diarrhea or abdominal pain.  No blood in  the stools. No dysphagia   Endocrine: No polyphagia, polyuria or polydipsia GU: No dysuria, gross hematuria, difficulty urinating. No urinary urgency or frequency. Musculoskeletal: No joint swellings or unusual aches or pains Skin: No change in the color of the skin, palor or rash Allergic, immunologic: No environmental allergies or food allergies Neurological: No dizziness or syncope. No headaches. No diplopia, slurred speech, motor deficits, facial numbness Hematological: No enlarged lymph nodes, easy bruising or bleeding Psychiatry: No suicidal ideas, hallucinations . No unusual/severe anxiety or depression.     Past Medical History  Diagnosis Date  . CAD (coronary artery disease)     CABG '97, SVG DES 12/13  . Hyperlipidemia   . BPH (benign prostatic hyperplasia)   . Colonic polyp   . History of pancreatitis     pt denies this hx on 11/02/2013  . Hearing loss   . Acute CHF 09/15/2012  . Ischemic cardiomyopathy 09/16/2012    EF 25%- improved to 45% 3/14  . PVD (peripheral vascular disease)     moderate bilat carotid disease  . Myocardial infarction 1997  . Exertional shortness of breath   . Type II diabetes mellitus   . GERD (gastroesophageal reflux disease)   . Asthma     pt denies this hx on 11/02/2013  . Syncope and collapse 11/02/2013    "didn't pass out" (11/02/2013)  . Hypertension   . 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 Coronary artery bypass graft  1997    "  CABG X 5"  . Shoulder open rotator cuff repair Right   . Nasal septum surgery  2007  . Cholecystectomy  2000's  . Inguinal hernia repair Bilateral ~ 1950  . Coronary angioplasty with stent placement  Dec 2013    DES to SVG-CFX/PDA  . Cataract extraction w/ intraocular lens  implant, bilateral Bilateral ~ 1970  . Retinal detachment surgery Left ?1995  . Left heart catheterization with coronary/graft angiogram  N/A 09/16/2012    Procedure: LEFT HEART CATHETERIZATION WITH Beatrix Fetters;  Surgeon: Lorretta Harp, MD;  Location: [facility] CATH LAB;  Service: Cardiovascular;  Laterality: N/A;    History   Social History  . Marital Status: Married    Spouse Name: N/A  . Number of Children: 1  . Years of Education: N/A   Occupational History  . 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 Smoking status: Former Smoker    Types: Cigars  . Smokeless tobacco: Never Used     Comment: 11/02/2013 "smoked cigars when I was 18 or so; didn't smoke many"  . Alcohol Use: 1.2 oz/week    2 Glasses of wine per week  . Drug Use: No  . Sexual Activity: No   Other Topics Concern  . 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 Colon cancer Neg Hx   . Prostate cancer Brother     dx ~ 59 y/o        Medication List       This list is accurate as of: 01/04/15 11:59 PM.  Always use your most recent med list.               acetaminophen 325 MG tablet  Commonly known as:  TYLENOL  Take 2 tablets (650 mg total) by mouth every 4 (four) hours as needed.     Adjustable Lancing Device Misc     albuterol 108 (90 BASE) MCG/ACT inhaler  Commonly known as:  PROVENTIL HFA;VENTOLIN HFA  Inhale 2 puffs into the lungs every 6 (six) hours as needed for wheezing or shortness of breath.     Alcohol Prep 70 % Pads     aspirin 81 MG EC tablet  Take 2 tablets (162 mg total) by mouth daily.     blood glucose meter kit and supplies  Use as instructed     clopidogrel 75 MG tablet  Commonly known as:  PLAVIX  Take 1 tablet (75 mg total) by mouth daily with breakfast.     doxazosin 4 MG tablet  Commonly known as:  CARDURA  Take 4 mg by mouth at bedtime.     ezetimibe 10  MG tablet  Commonly known as:  ZETIA  Take 1 tablet (10 mg total) by mouth daily.     finasteride 5 MG tablet  Commonly known as:  PROSCAR  Take 5 mg by mouth daily.     Fluticasone-Salmeterol 100-50 MCG/DOSE Aepb  Commonly known as:  ADVAIR  Inhale 1 puff into the lungs 2 (two) times daily as needed (shortness of breath).     furosemide 20 MG tablet  Commonly known as:  LASIX  Take 1 tablet (20  mg total) by mouth daily.     glucose blood test strip  Use to test blood sugars twice daily     insulin glargine 100 UNIT/ML injection  Commonly known as:  LANTUS  Inject 7 Units into the skin at bedtime.     Insulin Pen Needle 31G X 8 MM Misc  Use as directed     lisinopril 5 MG tablet  Commonly known as:  PRINIVIL,ZESTRIL  Take 1 tablet (5 mg total) by mouth daily.     metoprolol tartrate 25 MG tablet  Commonly known as:  LOPRESSOR  Take 0.5 tablets (12.5 mg total) by mouth 2 (two) times daily.     mometasone 50 MCG/ACT nasal spray  Commonly known as:  NASONEX  USE 2 SPRAYS NASALLY DAILY AS DIRECTED     nitroGLYCERIN 0.4 MG SL tablet  Commonly known as:  NITROSTAT  Place 1 tablet (0.4 mg total) under the tongue every 5 (five) minutes x 3 doses as needed for chest pain.     pantoprazole 40 MG tablet  Commonly known as:  PROTONIX  Take 1 tablet (40 mg total) by mouth daily.     pioglitazone 30 MG tablet  Commonly known as:  ACTOS  Take 1 tablet (30 mg total) by mouth daily.     PRODIGY LANCETS 28G Misc  Use to test blood sugars dx: 250.00     rosuvastatin 10 MG tablet  Commonly known as:  CRESTOR  Take 1 tablet (10 mg total) by mouth daily.     SYSTANE BALANCE OP  Apply 2 drops to eye. 2 drops each eye daily           Objective:   Physical Exam BP 124/64 mmHg  Pulse 66  Temp(Src) 97.6 F (36.4 C) (Oral)  Ht _0  (1.702 m)  Wt 198 lb 6 oz (89.982 kg)  BMI 31.06 kg/m2  SpO2 98% General:   Well developed, well nourished . NAD.  Neck:   No   thyromegaly , normal carotid pulse HEENT:  Normocephalic . Face symmetric, atraumatic Lungs:  CTA B, few end exp wheezes Normal respiratory effort, no intercostal retractions, no accessory muscle use. Heart: RRR,  no murmur.  Abdomen:  Not distended, soft, non-tender. No rebound or rigidity. No mass,organomegaly, no bruit  Muscle skeletal: no pretibial edema bilaterally  Skin: Exposed areas without rash. Not pale. Not jaundice Neurologic:  alert & oriented X3.  Speech normal, gait appropriate for age and unassisted Strength symmetric and appropriate for age.  Psych: Cognition and judgment appear intact.  Cooperative with normal attention span and concentration.  Behavior appropriate. No anxious or depressed appearing.       Assessment & Plan:

## 2015-01-04 NOTE — Assessment & Plan Note (Addendum)
Takes insulin at night, check blood sugars regularly but somewhat vague about readings. A month ago was seen with hypoglycemia at a  ER. Plan:  Decrease insulin to 7 units at night   monitor CBGs, see instructions Come back in 2 months with readings A1c goal 7-8.

## 2015-01-04 NOTE — Assessment & Plan Note (Signed)
Seems to be doing well, no recent chest pain, no evidence of CHF on clinical grounds

## 2015-01-04 NOTE — Progress Notes (Signed)
Pre visit review using our clinic review tool, if applicable. No additional management support is needed unless otherwise documented below in the visit note. 

## 2015-01-13 ENCOUNTER — Encounter: Payer: Self-pay | Admitting: Cardiology

## 2015-01-13 ENCOUNTER — Other Ambulatory Visit: Payer: Self-pay | Admitting: Cardiovascular Disease

## 2015-01-14 MED ORDER — ROSUVASTATIN CALCIUM 10 MG PO TABS: ORAL_TABLET | ORAL | Status: DC

## 2015-01-20 ENCOUNTER — Other Ambulatory Visit: Payer: Self-pay | Admitting: Cardiovascular Disease

## 2015-01-21 ENCOUNTER — Encounter: Payer: Self-pay | Admitting: Cardiovascular Disease

## 2015-01-24 MED ORDER — CLOPIDOGREL BISULFATE 75 MG PO TABS: 75.0000 mg | ORAL_TABLET | Freq: Every day | ORAL | Status: DC

## 2015-01-28 ENCOUNTER — Encounter: Payer: Self-pay | Admitting: Cardiovascular Disease

## 2015-02-04 ENCOUNTER — Encounter: Payer: Self-pay | Admitting: Internal Medicine

## 2015-02-08 ENCOUNTER — Encounter: Payer: Self-pay | Admitting: Cardiovascular Disease

## 2015-02-08 ENCOUNTER — Other Ambulatory Visit: Payer: Self-pay | Admitting: Cardiovascular Disease

## 2015-02-08 NOTE — Telephone Encounter (Signed)
Rx refill sent to patient pharmacy   

## 2015-02-09 ENCOUNTER — Other Ambulatory Visit: Payer: Self-pay | Admitting: *Deleted

## 2015-02-09 MED ORDER — PANTOPRAZOLE SODIUM 40 MG PO TBEC: 40.0000 mg | DELAYED_RELEASE_TABLET | Freq: Every day | ORAL | Status: DC

## 2015-02-09 MED ORDER — LISINOPRIL 5 MG PO TABS: 5.0000 mg | ORAL_TABLET | Freq: Every day | ORAL | Status: DC

## 2015-02-09 MED ORDER — EZETIMIBE 10 MG PO TABS: 10.0000 mg | ORAL_TABLET | Freq: Every day | ORAL | Status: DC

## 2015-03-09 ENCOUNTER — Ambulatory Visit: Payer: PRIVATE HEALTH INSURANCE | Admitting: Cardiovascular Disease

## 2015-03-15 ENCOUNTER — Ambulatory Visit: Payer: PRIVATE HEALTH INSURANCE | Admitting: Internal Medicine

## 2015-03-15 ENCOUNTER — Encounter: Payer: Self-pay | Admitting: Cardiovascular Disease

## 2015-03-15 MED ORDER — METOPROLOL TARTRATE 25 MG PO TABS: 12.5000 mg | ORAL_TABLET | Freq: Two times a day (BID) | ORAL | Status: DC

## 2015-03-21 ENCOUNTER — Encounter: Payer: Self-pay | Admitting: Internal Medicine

## 2015-03-22 ENCOUNTER — Ambulatory Visit: Payer: PRIVATE HEALTH INSURANCE | Admitting: Internal Medicine

## 2015-03-22 ENCOUNTER — Ambulatory Visit: Payer: Self-pay | Admitting: Internal Medicine

## 2015-03-25 ENCOUNTER — Ambulatory Visit (INDEPENDENT_AMBULATORY_CARE_PROVIDER_SITE_OTHER): Payer: Medicare Other | Admitting: Cardiovascular Disease

## 2015-03-25 ENCOUNTER — Encounter: Payer: Self-pay | Admitting: Cardiovascular Disease

## 2015-03-25 VITALS — BP 146/54 | HR 56 | Ht 68.0 in | Wt 190.0 lb

## 2015-03-25 DIAGNOSIS — E785 Hyperlipidemia, unspecified: Secondary | ICD-10-CM

## 2015-03-25 DIAGNOSIS — I6529 Occlusion and stenosis of unspecified carotid artery: Secondary | ICD-10-CM | POA: Diagnosis not present

## 2015-03-25 DIAGNOSIS — I25810 Atherosclerosis of coronary artery bypass graft(s) without angina pectoris: Secondary | ICD-10-CM

## 2015-03-25 NOTE — Patient Instructions (Signed)
  We will see you back in follow up in 1 year with Dr Berry  Dr Berry has ordered: Carotid Duplex- This test is an ultrasound of the carotid arteries in your neck. It looks at blood flow through these arteries that supply the brain with blood. Allow one hour for this exam. There are no restrictions or special instructions.    

## 2015-03-25 NOTE — Assessment & Plan Note (Signed)
History of asymptomatic bilateral carotid artery disease right greater than left with her following by duplex ultrasound on annual basis.

## 2015-03-25 NOTE — Assessment & Plan Note (Signed)
History of hyperlipidemia on Zetia and Crestor with recent lipid profile performed 01/04/15 revealing a total cholesterol 117, LDL 62 and HDL of 43.

## 2015-03-25 NOTE — Progress Notes (Signed)
03/25/2015 [name].   [birth year]  [id number]  Primary Physician Kathlene November, MD Primary Cardiologist: Lorretta Harp MD Renae Gloss    HPI: The patient returns today for followup. He was last seen in our office by Kerin Ransom, PA-C, 51month ago and by myself one year ago..Marland Kitchene is an 81year old married Caucasian male with a history of CAD status post coronary artery bypass grafting in 1997 with a LIMA to his LAD, a vein to a first diagonal branch, OM and PDA. His other problems include hypertension, hyperlipidemia and noninsulin-requiring diabetes. He has known peripheral vascular with moderate bilateral internal carotid artery stenosis which we are following by duplex ultrasound. He had a Myoview performed September 20, 2011, which showed scar in the LAD territory unchanged from prior studies with an EF of 35% to 45% by 2D echo. [facility] and [facility]. [facility] did have slightly elevated troponins and catheterization revealed stenosis in his SVG to his PDA which was stented with a DES stent. His EF at that time was 20% to 25% and a LifeVest was placed. He has done well since that time and is relatively asymptomatic. Limited echo today revealed an EF in the 45% range. He did have an episode of presyncope thought to be vasovagal evaluated by his primary care physician Dr. MAdella Hare. His beta blocker was reduced in dose. An event monitor was placed. Since I saw him last he denies chest pain or shortness of breath.  Current Outpatient Prescriptions  Medication Sig Dispense Refill  . acetaminophen (TYLENOL) 325 MG tablet Take 2 tablets (650 mg total) by mouth every 4 (four) hours as needed.    . Alcohol Swabs (ALCOHOL PREP) 70 % PADS     . aspirin EC 81 MG EC tablet Take 2 tablets (162 mg total) by mouth daily.    . Blood Glucose Monitoring Suppl  (BLOOD GLUCOSE METER) kit Use as instructed 1 each 0  . clopidogrel (PLAVIX) 75 MG tablet Take 1 tablet (75 mg total) by mouth daily with breakfast. 90 tablet 3  . doxazosin (CARDURA) 4 MG tablet Take 4 mg by mouth at bedtime.      .Marland Kitchenezetimibe (ZETIA) 10 MG tablet Take 1 tablet (10 mg total) by mouth daily. 90 tablet 3  . finasteride (PROSCAR) 5 MG tablet Take 5 mg by mouth daily.    . Fluticasone-Salmeterol (ADVAIR) 100-50 MCG/DOSE AEPB Inhale 1 puff into the lungs 2 (two) times daily as needed (shortness of breath). 180 each 3  . furosemide (LASIX) 20 MG tablet TAKE 1 TABLET DAILY 90 tablet 0  . glucose blood test strip Use to test blood sugars twice daily 100 each 5  . insulin glargine (LANTUS) 100 UNIT/ML injection Inject 7 Units into the skin at bedtime.    . Insulin Pen Needle 31G X 8 MM MISC Use as directed 50 each 1  . Lancet Devices (ADJUSTABLE LANCING DEVICE) MISC     . lisinopril (PRINIVIL,ZESTRIL) 5 MG tablet Take 1 tablet (5 mg total) by mouth daily. 90 tablet 3  . metoprolol tartrate (LOPRESSOR) 25 MG tablet Take 0.5 tablets (12.5 mg total) by mouth 2 (two) times daily. 90 tablet 0  . mometasone (NASONEX) 50 MCG/ACT nasal spray USE 2 SPRAYS NASALLY DAILY AS DIRECTED 17 g 4  . nitroGLYCERIN (NITROSTAT) 0.4 MG SL tablet Place 1 tablet (  0.4 mg total) under the tongue every 5 (five) minutes x 3 doses as needed for chest pain. 25 tablet 2  . pantoprazole (PROTONIX) 40 MG tablet Take 1 tablet (40 mg total) by mouth daily. 90 tablet 3  . pioglitazone (ACTOS) 30 MG tablet Take 1 tablet (30 mg total) by mouth daily. 90 tablet 1  . PRODIGY LANCETS 28G MISC Use to test blood sugars dx: 250.00 100 each 5  . Propylene Glycol (SYSTANE BALANCE OP) Apply 2 drops to eye. 2 drops each eye daily    . rosuvastatin (CRESTOR) 10 MG tablet TAKE 1 TABLET DAILY (DOSE  INCREASE) 90 tablet 0   No current facility-administered medications for this visit.    Allergies  Allergen Reactions  . Colesevelam       unknown  . Ezetimibe-Simvastatin     unknown    History   Social History  . Marital Status: Married    Spouse Name: N/A  . Number of Children: 1  . Years of Education: N/A   Occupational History  . 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 Smoking status: Former Smoker    Types: Cigars  . Smokeless tobacco: Never Used     Comment: 11/02/2013 "smoked cigars when I was 18 or so; didn't smoke many"  . Alcohol Use: 1.2 oz/week    2 Glasses of wine per week  . Drug Use: No  . Sexual Activity: No   Other Topics Concern  . Not on file   Social History Narrative   Married '50-24 yrs, divorced; married '82   1 son- '51   Retired but keeps up 4 rental houses, mows   End of Life: no CPR, no heroic or futile measures     Review of Systems: General: negative for chills, fever, night sweats or weight changes.  Cardiovascular: negative for chest pain, dyspnea on exertion, edema, orthopnea, palpitations, paroxysmal nocturnal dyspnea or shortness of breath Dermatological: negative for rash Respiratory: negative for cough or wheezing Urologic: negative for hematuria Abdominal: negative for nausea, vomiting, diarrhea, bright red blood per rectum, melena, or hematemesis Neurologic: negative for visual changes, syncope, or dizziness All other systems reviewed and are otherwise negative except as noted above.    Blood pressure 146/54, pulse 56, height 5' 8" (1.727 m), weight 190 lb (86.183 kg).  General appearance: alert and no distress Neck: no adenopathy, no JVD, supple, symmetrical, trachea midline, thyroid not enlarged, symmetric, no tenderness/mass/nodules and soft right carotid bruit Lungs: clear to auscultation bilaterally Heart: regular rate and rhythm, S1, S2 normal, no murmur, click, rub or gallop Extremities: extremities normal, atraumatic, no cyanosis or edema  EKG sinus bradycardia 56 without ST or T-wave changes. I personally reviewed  this EKG  ASSESSMENT AND PLAN:   S/P angioplasty with stent to prox SVG to LCX-PDA 09/26/12  History of CAD status post coronary artery bypass grafting in 1997 with a LIMA to his LAD, vein graft to the first branch, obtuse marginal branch and PDA. He had intervention to the vein graft to his PDA with drug-eluting stent at which time his EF was in the 20-25% range. He is placed on LifeVest and this ultimately improved to 45% range. He is asymptomatic and specifically denies chest pain or shortness of breath.  PVD- moderate carotid disease History of asymptomatic bilateral carotid artery disease right greater than left with her following by duplex ultrasound on annual basis.  Hyperlipidemia History of hyperlipidemia  on Zetia and Crestor with recent lipid profile performed 01/04/15 revealing a total cholesterol 117, LDL 62 and HDL of 43.      Lorretta Harp MD FACP,FACC,FAHA, [facility] 03/25/2015 12:22 PM

## 2015-03-25 NOTE — Assessment & Plan Note (Signed)
History of CAD status post coronary artery bypass grafting in 1997 with a LIMA to his LAD, vein graft to the first branch, obtuse marginal branch and PDA. He had intervention to the vein graft to his PDA with drug-eluting stent at which time his EF was in the 20-25% range. He is placed on LifeVest and this ultimately improved to 45% range. He is asymptomatic and specifically denies chest pain or shortness of breath.

## 2015-03-29 ENCOUNTER — Encounter: Payer: Self-pay | Admitting: Internal Medicine

## 2015-03-29 ENCOUNTER — Ambulatory Visit (INDEPENDENT_AMBULATORY_CARE_PROVIDER_SITE_OTHER): Payer: Medicare Other | Admitting: Internal Medicine

## 2015-03-29 VITALS — BP 116/64 | HR 72 | Temp 97.5°F | Ht 68.0 in | Wt 192.0 lb

## 2015-03-29 DIAGNOSIS — I6529 Occlusion and stenosis of unspecified carotid artery: Secondary | ICD-10-CM | POA: Diagnosis not present

## 2015-03-29 DIAGNOSIS — E1159 Type 2 diabetes mellitus with other circulatory complications: Secondary | ICD-10-CM | POA: Diagnosis not present

## 2015-03-29 DIAGNOSIS — I25810 Atherosclerosis of coronary artery bypass graft(s) without angina pectoris: Secondary | ICD-10-CM | POA: Diagnosis not present

## 2015-03-29 DIAGNOSIS — E785 Hyperlipidemia, unspecified: Secondary | ICD-10-CM | POA: Diagnosis not present

## 2015-03-29 MED ORDER — PIOGLITAZONE HCL 30 MG PO TABS: 30.0000 mg | ORAL_TABLET | Freq: Every day | ORAL | Status: DC

## 2015-03-29 NOTE — Progress Notes (Signed)
Pre visit review using our clinic review tool, if applicable. No additional management support is needed unless otherwise documented below in the visit note. 

## 2015-03-29 NOTE — Progress Notes (Signed)
Subjective:    Patient ID: [name]., male    DOB: 02/23/1926, 81 y.o.   MRN: [id number]  DOS:  03/29/2015 Type of visit - description : ROV Interval history:  Patient is a 81 year old male with a history of CAD, HLD, and diabetes in today for routine medical care.  Diabetes: Patient notes this is well-controlled on current regimen of 7 units of insulin qhs and Actos. Current CBGs range between 140 - 165. Patient notes no vision changes or changes in urination and sees ophthalmology yearly. Notes no recent hypoglycemic episodes.  Hyperlipidemia: Patient notes good adherence to medications and denies any side effects.  Review of Systems  Constitutional: No fever. No chills.   Respiratory: No wheezing, no difficulty breathing.   Cardiovascular: No CP  GI: no nausea, no vomiting, no diarrhea, no abdominal pain.  No blood in the stools.     GU: No dysuria, gross hematuria, difficulty urinating. No urinary urgency.  Neurological: No dizziness or headaches. No diplopia.     Past Medical History  Diagnosis Date  . CAD (coronary artery disease)     CABG '97, SVG DES 12/13  . Hyperlipidemia   . BPH (benign prostatic hyperplasia)   . Colonic polyp   . History of pancreatitis     pt denies this hx on 11/02/2013  . Hearing loss   . Acute CHF 09/15/2012  . Ischemic cardiomyopathy 09/16/2012    EF 25%- improved to 45% 3/14  . PVD (peripheral vascular disease)     moderate bilat carotid disease  . Myocardial infarction 1997  . Exertional shortness of breath   . Type II diabetes mellitus   . GERD (gastroesophageal reflux disease)   . Asthma     pt denies this hx on 11/02/2013  . Syncope and collapse 11/02/2013    "didn't pass out" (11/02/2013)  . Hypertension   . 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 Coronary artery bypass graft  1997    "CABG X 5"  . Shoulder open  rotator cuff repair Right   . Nasal septum surgery  2007  . Cholecystectomy  2000's  . Inguinal hernia repair Bilateral ~ 1950  . Coronary angioplasty with stent placement  Dec 2013    DES to SVG-CFX/PDA  . Cataract extraction w/ intraocular lens  implant, bilateral Bilateral ~ 1970  . Retinal detachment surgery Left ?1995  . Left heart catheterization with coronary/graft angiogram N/A 09/16/2012    Procedure: LEFT HEART CATHETERIZATION WITH Beatrix Fetters;  Surgeon: Lorretta Harp, MD;  Location[facility] C[facility] LAB;  Service: Cardiovascular;  Laterality: N/A;    History   Social History  . Marital Status: Married    Spouse Name: N/A  . Number of Children: 1  . Years of Education: N/A   Occupational History  . 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 Smoking status: Former Smoker    Types: Cigars  . Smokeless tobacco: Never Used     Comment: 11/02/2013 "smoked cigars when I was 18 or so; didn't smoke many"  . Alcohol Use: 1.2 oz/week    2 Glasses of wine per week  . Drug Use: No  . Sexual Activity: No   Other Topics Concern  . Not on file   Social History Narrative  Married '50-24 yrs, divorced; married '82   1 son- '51   Retired but keeps up 4 rental houses, mows   End of Life: no CPR, no heroic or futile measures     Family History  Problem Relation Age of Onset  . Colon cancer Neg Hx   . Prostate cancer Brother     dx ~ 22 y/o       Medication List       This list is accurate as of: 03/29/15 11:59 PM.  Always use your most recent med list.               acetaminophen 325 MG tablet  Commonly known as:  TYLENOL  Take 2 tablets (650 mg total) by mouth every 4 (four) hours as needed.     Adjustable Lancing Device Misc     Alcohol Prep 70 % Pads     aspirin 81 MG EC tablet  Take 2 tablets (162 mg total) by mouth daily.     blood glucose meter kit and supplies  Use as instructed     clopidogrel 75 MG tablet    Commonly known as:  PLAVIX  Take 1 tablet (75 mg total) by mouth daily with breakfast.     doxazosin 4 MG tablet  Commonly known as:  CARDURA  Take 4 mg by mouth at bedtime.     ezetimibe 10 MG tablet  Commonly known as:  ZETIA  Take 1 tablet (10 mg total) by mouth daily.     finasteride 5 MG tablet  Commonly known as:  PROSCAR  Take 5 mg by mouth daily.     Fluticasone-Salmeterol 100-50 MCG/DOSE Aepb  Commonly known as:  ADVAIR  Inhale 1 puff into the lungs 2 (two) times daily as needed (shortness of breath).     furosemide 20 MG tablet  Commonly known as:  LASIX  TAKE 1 TABLET DAILY     glucose blood test strip  Use to test blood sugars twice daily     insulin glargine 100 UNIT/ML injection  Commonly known as:  LANTUS  Inject 7 Units into the skin at bedtime.     Insulin Pen Needle 31G X 8 MM Misc  Use as directed     lisinopril 5 MG tablet  Commonly known as:  PRINIVIL,ZESTRIL  Take 1 tablet (5 mg total) by mouth daily.     metoprolol tartrate 25 MG tablet  Commonly known as:  LOPRESSOR  Take 0.5 tablets (12.5 mg total) by mouth 2 (two) times daily.     mometasone 50 MCG/ACT nasal spray  Commonly known as:  NASONEX  USE 2 SPRAYS NASALLY DAILY AS DIRECTED     nitroGLYCERIN 0.4 MG SL tablet  Commonly known as:  NITROSTAT  Place 1 tablet (0.4 mg total) under the tongue every 5 (five) minutes x 3 doses as needed for chest pain.     pantoprazole 40 MG tablet  Commonly known as:  PROTONIX  Take 1 tablet (40 mg total) by mouth daily.     pioglitazone 30 MG tablet  Commonly known as:  ACTOS  Take 1 tablet (30 mg total) by mouth daily.     PRODIGY LANCETS 28G Misc  Use to test blood sugars dx: 250.00     rosuvastatin 10 MG tablet  Commonly known as:  CRESTOR  TAKE 1 TABLET DAILY (DOSE  INCREASE)     SYSTANE BALANCE OP  Apply 2 drops to eye. 2 drops each eye  daily           Objective:   Physical Exam BP 116/64 mmHg  Pulse 72  Temp(Src) 97.5 F  (36.4 C) (Oral)  Ht _0  (1.727 m)  Wt 192 lb (87.091 kg)  BMI 29.20 kg/m2  SpO2 98%  General:   Well developed, well nourished . NAD.  HEENT:  Normocephalic . Face symmetric, atraumatic Lungs:  CTA B Normal respiratory effort, no intercostal retractions, no accessory muscle use. Heart: RRR,  no murmur.  No pretibial edema bilaterally  Skin: Not pale. Not jaundice Neurologic:  alert & oriented X3.  Speech normal, gait appropriate for age and unassisted Psych--  Cognition and judgment appear intact.  Cooperative with normal attention span and concentration.  Behavior appropriate. No anxious or depressed appearing.     Assessment & Plan:   (Patient seen along with   Aletta Edouard, medical student)

## 2015-03-29 NOTE — Patient Instructions (Signed)
Go to the lab  Call if you have  any low blood sugar symptoms  Next visit in 4-5 months  Hypoglycemia Hypoglycemia occurs when the glucose in your blood is too low. Glucose is a type of sugar that is your body's main energy source. Hormones, such as insulin and glucagon, control the level of glucose in the blood. Insulin lowers blood glucose and glucagon increases blood glucose. Having too much insulin in your blood stream, or not eating enough food containing sugar, can result in hypoglycemia. Hypoglycemia can happen to people with or without diabetes. It can develop quickly and can be a medical emergency.  CAUSES   Missing or delaying meals.  Not eating enough carbohydrates at meals.  Taking too much diabetes medicine.  Not timing your oral diabetes medicine or insulin doses with meals, snacks, and exercise.  Nausea and vomiting.  Certain medicines.  Severe illnesses, such as hepatitis, kidney disorders, and certain eating disorders.  Increased activity or exercise without eating something extra or adjusting medicines.  Drinking too much alcohol.  A nerve disorder that affects body functions like your heart rate, blood pressure, and digestion (autonomic neuropathy).  A condition where the stomach muscles do not function properly (gastroparesis). Therefore, medicines and food may not absorb properly.  Rarely, a tumor of the pancreas can produce too much insulin. SYMPTOMS   Hunger.  Sweating (diaphoresis).  Change in body temperature.  Shakiness.  Headache.  Anxiety.  Lightheadedness.  Irritability.  Difficulty concentrating.  Dry mouth.  Tingling or numbness in the hands or feet.  Restless sleep or sleep disturbances.  Altered speech and coordination.  Change in mental status.  Seizures or prolonged convulsions.  Combativeness.  Drowsiness (lethargic).  Weakness.  Increased heart rate or palpitations.  Confusion.  Pale, gray skin  color.  Blurred or double vision.  Fainting. DIAGNOSIS  A physical exam and medical history will be performed. Your caregiver may make a diagnosis based on your symptoms. Blood tests and other lab tests may be performed to confirm a diagnosis. Once the diagnosis is made, your caregiver will see if your signs and symptoms go away once your blood glucose is raised.  TREATMENT  Usually, you can easily treat your hypoglycemia when you notice symptoms.  Check your blood glucose. If it is less than 70 mg/dl, take one of the following:   3-4 glucose tablets.    cup juice.    cup regular soda.   1 cup skim milk.   -1 tube of glucose gel.   5-6 hard candies.   Avoid high-fat drinks or food that may delay a rise in blood glucose levels.  Do not take more than the recommended amount of sugary foods, drinks, gel, or tablets. Doing so will cause your blood glucose to go too high.   Wait 10-15 minutes and recheck your blood glucose. If it is still less than 70 mg/dl or below your target range, repeat treatment.   Eat a snack if it is more than 1 hour until your next meal.  There may be a time when your blood glucose may go so low that you are unable to treat yourself at home when you start to notice symptoms. You may need someone to help you. You may even faint or be unable to swallow. If you cannot treat yourself, someone will need to bring you to the hospital.  HOME CARE INSTRUCTIONS  If you have diabetes, follow your diabetes management plan by:  Taking your medicines as  directed.  Following your exercise plan.  Following your meal plan. Do not skip meals. Eat on time.  Testing your blood glucose regularly. Check your blood glucose before and after exercise. If you exercise longer or different than usual, be sure to check blood glucose more frequently.  Wearing your medical alert jewelry that says you have diabetes.  Identify the cause of your hypoglycemia. Then,  develop ways to prevent the recurrence of hypoglycemia.  Do not take a hot bath or shower right after an insulin shot.  Always carry treatment with you. Glucose tablets are the easiest to carry.  If you are going to drink alcohol, drink it only with meals.  Tell friends or family members ways to keep you safe during a seizure. This may include removing hard or sharp objects from the area or turning you on your side.  Maintain a healthy weight. SEEK MEDICAL CARE IF:   You are having problems keeping your blood glucose in your target range.  You are having frequent episodes of hypoglycemia.  You feel you might be having side effects from your medicines.  You are not sure why your blood glucose is dropping so low.  You notice a change in vision or a new problem with your vision. SEEK IMMEDIATE MEDICAL CARE IF:   Confusion develops.  A change in mental status occurs.  The inability to swallow develops.  Fainting occurs. Document Released: 10/01/2005 Document Revised: 10/06/2013 Document Reviewed: 01/28/2012 [facility] Patient Information 2015 [place], [place]. This information is not intended to replace advice given to you by your health care provider. Make sure you discuss any questions you have with your health care provider.

## 2015-03-30 LAB — HEMOGLOBIN A1C: Hgb A1c MFr Bld: 6.7 % — ABNORMAL HIGH (ref 4.6–6.5)

## 2015-03-30 NOTE — Assessment & Plan Note (Signed)
Previous A1c 6.5, at last appointment we decreased insulin to 7 units.  Today notes CBGs run between 140 and 165.  Denies any episodes of hypoglycemia.  Sees ophthomology yearly Plan: Will check A1c today.  Will lower or d/c  insulin if A1c is below 6.0. Otherwise will continue current regimen. Patient counseled on importance of avoiding hypoglycemic episodes.

## 2015-03-30 NOTE — Assessment & Plan Note (Signed)
CAD: Patient today is stable, care is managed by cardiology with plans for Carotid Doppler soon.

## 2015-03-30 NOTE — Assessment & Plan Note (Signed)
Good compliance with medication, previous FLP was within normal limits. Will recheck FLP at next appointment.

## 2015-04-11 ENCOUNTER — Other Ambulatory Visit: Payer: Self-pay

## 2015-05-05 ENCOUNTER — Ambulatory Visit (HOSPITAL_COMMUNITY)
Admission: RE | Admit: 2015-05-05 | Discharge: 2015-05-05 | Disposition: A | Discharge status: Home / Self Care | Payer: Medicare Other | Source: Ambulatory Visit | Attending: Cardiovascular Disease | Admitting: Cardiovascular Disease

## 2015-05-05 DIAGNOSIS — I6523 Occlusion and stenosis of bilateral carotid arteries: Secondary | ICD-10-CM | POA: Diagnosis not present

## 2015-05-05 DIAGNOSIS — I6529 Occlusion and stenosis of unspecified carotid artery: Secondary | ICD-10-CM

## 2015-05-09 ENCOUNTER — Other Ambulatory Visit: Payer: Self-pay

## 2015-05-09 MED ORDER — ROSUVASTATIN CALCIUM 10 MG PO TABS: 10.0000 mg | ORAL_TABLET | Freq: Every day | ORAL | Status: DC

## 2015-05-09 MED ORDER — FUROSEMIDE 20 MG PO TABS: 20.0000 mg | ORAL_TABLET | Freq: Every day | ORAL | Status: DC

## 2015-05-09 NOTE — Telephone Encounter (Signed)
Rx(s) sent to pharmacy electronically.  

## 2015-05-09 NOTE — Addendum Note (Signed)
Addended by: Neta Ehlers on: 05/09/2015 04:12 PM   Modules accepted: Orders

## 2015-05-10 ENCOUNTER — Other Ambulatory Visit: Payer: Self-pay | Admitting: *Deleted

## 2015-05-30 ENCOUNTER — Other Ambulatory Visit: Payer: Self-pay

## 2015-05-31 ENCOUNTER — Encounter: Payer: Self-pay | Admitting: Internal Medicine

## 2015-05-31 ENCOUNTER — Ambulatory Visit (INDEPENDENT_AMBULATORY_CARE_PROVIDER_SITE_OTHER): Payer: Medicare Other | Admitting: Internal Medicine

## 2015-05-31 VITALS — BP 118/52 | HR 64 | Temp 98.4°F | Ht 68.0 in | Wt 195.1 lb

## 2015-05-31 DIAGNOSIS — R5382 Chronic fatigue, unspecified: Secondary | ICD-10-CM

## 2015-05-31 DIAGNOSIS — D649 Anemia, unspecified: Secondary | ICD-10-CM | POA: Diagnosis not present

## 2015-05-31 DIAGNOSIS — I6529 Occlusion and stenosis of unspecified carotid artery: Secondary | ICD-10-CM

## 2015-05-31 DIAGNOSIS — R51 Headache: Secondary | ICD-10-CM

## 2015-05-31 DIAGNOSIS — R519 Headache, unspecified: Secondary | ICD-10-CM

## 2015-05-31 DIAGNOSIS — R5383 Other fatigue: Secondary | ICD-10-CM

## 2015-05-31 LAB — BASIC METABOLIC PANEL
BUN: 13 mg/dL (ref 6–23)
CHLORIDE: 99 meq/L (ref 96–112)
CO2: 30 mEq/L (ref 19–32)
Calcium: 9.1 mg/dL (ref 8.4–10.5)
Creatinine, Ser: 0.77 mg/dL (ref 0.40–1.50)
GFR: 101.53 mL/min (ref >60.00)
Glucose, Bld: 134 mg/dL — ABNORMAL HIGH (ref 70–99)
Potassium: 3.7 mEq/L (ref 3.5–5.1)
SODIUM: 134 meq/L — AB (ref 135–145)

## 2015-05-31 LAB — FOLATE: FOLATE: 9.9 ng/mL (ref >5.9)

## 2015-05-31 LAB — HEMOGLOBIN: HEMOGLOBIN: 12.4 g/dL — AB (ref 13.0–17.0)

## 2015-05-31 LAB — SEDIMENTATION RATE: SED RATE: 11 mm/h (ref 0–22)

## 2015-05-31 LAB — IRON: Iron: 59 ug/dL (ref 42–165)

## 2015-05-31 LAB — FERRITIN: Ferritin: 147.6 ng/mL (ref 22.0–322.0)

## 2015-05-31 LAB — VITAMIN B12: Vitamin B-12: 248 pg/mL (ref 211–911)

## 2015-05-31 NOTE — Patient Instructions (Signed)
Get your blood work before you leave    If the headache gets  worse, you have fever, chills, weight loss: Please let me know.

## 2015-05-31 NOTE — Progress Notes (Signed)
Subjective:    Patient ID: Ryan Plumb., male    DOB: 04/04/1926, 81 y.o.   MRN: [id number]  DOS:  05/31/2015 Type of visit - description : Acute visit, here with his wife Interval history: Not feeling well for the last 2 weeks, complains of lack of energy and a mild headache. The headache is on and off, Tylenol is not helping, never severe, no history of previous headache.    Review of Systems Denies fever, chills or weight loss. No unusual aches or pains. No rash No chest pain, difficulty breathing, lower extremity edema or palpitations. No dyspnea on exertion. No nausea, vomiting, diarrhea. No abdominal pain, bowel movements are normal. No anxiety, mild depression?. No dysuria, gross hematuria difficulty urinating. When asked, wife reports that he has been snoring a little bit lately.  Past Medical History  Diagnosis Date  . CAD (coronary artery disease)     CABG '97, SVG DES 12/13  . Hyperlipidemia   . BPH (benign prostatic hyperplasia)   . Colonic polyp   . History of pancreatitis     pt denies this hx on 11/02/2013  . Hearing loss   . Acute CHF 09/15/2012  . Ischemic cardiomyopathy 09/16/2012    EF 25%- improved to 45% 3/14  . PVD (peripheral vascular disease)     moderate bilat carotid disease  . Myocardial infarction 1997  . Exertional shortness of breath   . Type II diabetes mellitus   . GERD (gastroesophageal reflux disease)   . Asthma     pt denies this hx on 11/02/2013  . Syncope and collapse 11/02/2013    "didn't pass out" (11/02/2013)  . Hypertension   . 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 Coronary artery bypass graft  1997    "CABG X 5"  . Shoulder open rotator cuff repair Right   . Nasal septum surgery  2007  . Cholecystectomy  2000's  . Inguinal hernia repair Bilateral ~ 1950  . Coronary angioplasty with stent placement  Dec 2013    DES to  SVG-CFX/PDA  . Cataract extraction w/ intraocular lens  implant, bilateral Bilateral ~ 1970  . Retinal detachment surgery Left ?1995  . Left heart catheterization with coronary/graft angiogram N/A 09/16/2012    Procedure: LEFT HEART CATHETERIZATION WITH Beatrix Fetters;  Surgeon: Lorretta Harp, MD;  Location: [facility] CATH LAB;  Service: Cardiovascular;  Laterality: N/A;    Social History   Social History  . Marital Status: Married    Spouse Name: N/A  . Number of Children: 1  . Years of Education: N/A   Occupational History  . 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 Smoking status: Former Smoker    Types: Cigars  . Smokeless tobacco: Never Used     Comment: 11/02/2013 "smoked cigars when I was 18 or so; didn't smoke many"  . Alcohol Use: 1.2 oz/week    2 Glasses of wine per week  . Drug Use: No  . Sexual Activity: No   Other Topics Concern  . Not on file   Social History Narrative   Married '50-24 yrs, divorced; married '82   1 son- '51   Retired but keeps up [employer], mows   End of Life: no CPR, no heroic or futile measures  Medication List       This list is accurate as of: 05/31/15 11:59 PM.  Always use your most recent med list.               acetaminophen 325 MG tablet  Commonly known as:  TYLENOL  Take 2 tablets (650 mg total) by mouth every 4 (four) hours as needed.     Adjustable Lancing Device Misc     Alcohol Prep 70 % Pads     aspirin 81 MG EC tablet  Take 2 tablets (162 mg total) by mouth daily.     blood glucose meter kit and supplies  Use as instructed     clopidogrel 75 MG tablet  Commonly known as:  PLAVIX  Take 1 tablet (75 mg total) by mouth daily with breakfast.     doxazosin 4 MG tablet  Commonly known as:  CARDURA  Take 4 mg by mouth at bedtime.     ezetimibe 10 MG tablet  Commonly known as:  ZETIA  Take 1 tablet (10 mg total) by mouth daily.     finasteride 5 MG tablet    Commonly known as:  PROSCAR  Take 5 mg by mouth daily.     Fluticasone-Salmeterol 100-50 MCG/DOSE Aepb  Commonly known as:  ADVAIR  Inhale 1 puff into the lungs 2 (two) times daily as needed (shortness of breath).     furosemide 20 MG tablet  Commonly known as:  LASIX  Take 1 tablet (20 mg total) by mouth daily.     glucose blood test strip  Use to test blood sugars twice daily     insulin glargine 100 UNIT/ML injection  Commonly known as:  LANTUS  Inject 7 Units into the skin at bedtime.     Insulin Pen Needle 31G X 8 MM Misc  Use as directed     lisinopril 5 MG tablet  Commonly known as:  PRINIVIL,ZESTRIL  Take 1 tablet (5 mg total) by mouth daily.     metoprolol tartrate 25 MG tablet  Commonly known as:  LOPRESSOR  Take 0.5 tablets (12.5 mg total) by mouth 2 (two) times daily.     mometasone 50 MCG/ACT nasal spray  Commonly known as:  NASONEX  USE 2 SPRAYS NASALLY DAILY AS DIRECTED     nitroGLYCERIN 0.4 MG SL tablet  Commonly known as:  NITROSTAT  Place 1 tablet (0.4 mg total) under the tongue every 5 (five) minutes x 3 doses as needed for chest pain.     pantoprazole 40 MG tablet  Commonly known as:  PROTONIX  Take 1 tablet (40 mg total) by mouth daily.     pioglitazone 30 MG tablet  Commonly known as:  ACTOS  Take 1 tablet (30 mg total) by mouth daily.     rosuvastatin 10 MG tablet  Commonly known as:  CRESTOR  Take 1 tablet (10 mg total) by mouth daily.     SYSTANE BALANCE OP  Apply 2 drops to eye. 2 drops each eye daily           Objective:   Physical Exam BP 118/52 mmHg  Pulse 64  Temp(Src) 98.4 F (36.9 C) (Oral)  Ht '5\' 8"'  (1.727 m)  Wt 195 lb 2 oz (88.508 kg)  BMI 29.68 kg/m2  SpO2 96% General:   Well developed, well nourished . NAD, seems at baseline.  HEENT:  Normocephalic . Face symmetric, atraumatic Lungs:  CTA B Normal respiratory effort, no intercostal retractions,  no accessory muscle use. Heart: RRR,  no murmur.  no  pretibial edema bilaterally  Abdomen:  Not distended, soft, non-tender. No rebound or rigidity.  Skin: Not pale. Not jaundice Neurologic:  alert & oriented X3.  Speech normal, gait appropriate for age and unassisted Psych--  Cognition and judgment appear intact.  Cooperative with normal attention span and concentration.  Behavior appropriate. No anxious or depressed appearing.    Assessment & Plan:

## 2015-05-31 NOTE — Progress Notes (Signed)
Pre visit review using our clinic review tool, if applicable. No additional management support is needed unless otherwise documented below in the visit note. 

## 2015-06-01 DIAGNOSIS — R5383 Other fatigue: Secondary | ICD-10-CM | POA: Insufficient documentation

## 2015-06-01 NOTE — Assessment & Plan Note (Signed)
81 year old gentleman with multiple medical problems reports mild fatigue and mild headache. Physical exam is benign, review systems is negative for any acute cardiovascular problem. He did admits to some snoring. Diabetes is well controlled, he did have mild anemia before. Plan: BMP, hemoglobin, iron, vitamins and sedimentation rate. If workup negative I would recommend observation for now. Reassess in one month

## 2015-06-28 ENCOUNTER — Ambulatory Visit (INDEPENDENT_AMBULATORY_CARE_PROVIDER_SITE_OTHER): Payer: Medicare Other | Admitting: Internal Medicine

## 2015-06-28 ENCOUNTER — Encounter: Payer: Self-pay | Admitting: Internal Medicine

## 2015-06-28 ENCOUNTER — Ambulatory Visit (HOSPITAL_BASED_OUTPATIENT_CLINIC_OR_DEPARTMENT_OTHER)
Admission: RE | Admit: 2015-06-28 | Discharge: 2015-06-28 | Disposition: A | Discharge status: Home / Self Care | Payer: Medicare Other | Source: Ambulatory Visit | Attending: Internal Medicine | Admitting: Internal Medicine

## 2015-06-28 VITALS — BP 128/76 | HR 76 | Temp 98.1°F | Ht 68.0 in | Wt 199.4 lb

## 2015-06-28 DIAGNOSIS — I6529 Occlusion and stenosis of unspecified carotid artery: Secondary | ICD-10-CM | POA: Diagnosis not present

## 2015-06-28 DIAGNOSIS — Z951 Presence of aortocoronary bypass graft: Secondary | ICD-10-CM | POA: Diagnosis not present

## 2015-06-28 DIAGNOSIS — R918 Other nonspecific abnormal finding of lung field: Secondary | ICD-10-CM | POA: Insufficient documentation

## 2015-06-28 DIAGNOSIS — I509 Heart failure, unspecified: Secondary | ICD-10-CM | POA: Diagnosis not present

## 2015-06-28 DIAGNOSIS — I25709 Atherosclerosis of coronary artery bypass graft(s), unspecified, with unspecified angina pectoris: Secondary | ICD-10-CM | POA: Diagnosis not present

## 2015-06-28 DIAGNOSIS — R0602 Shortness of breath: Secondary | ICD-10-CM | POA: Diagnosis present

## 2015-06-28 DIAGNOSIS — Z23 Encounter for immunization: Secondary | ICD-10-CM | POA: Diagnosis not present

## 2015-06-28 DIAGNOSIS — J90 Pleural effusion, not elsewhere classified: Secondary | ICD-10-CM | POA: Diagnosis not present

## 2015-06-28 NOTE — Progress Notes (Signed)
Subjective:    Patient ID: Ryan Plumb., male    DOB: 04-10-1926, 81 y.o.   MRN: [id number]  DOS:  06/28/2015 Type of visit - description : Follow-up previous visit Interval history: Was seen last month. General fatigue, in that respect he feels better. He had a BMP, sedimentation rate >> normal. Hemoglobin 12.4 stable Today however he complains of dyspnea on exertion, going on for a while but worse in the last 2 weeks? A couple of times he had DOE with walking within his house O(after walking few steps) . Also, when he goes upstairs he gets short of breath, new sx since last year.   Review of Systems Denies fever chills No lower extremity edema, no chest pain He has a history of asthma, denies cough, sputum production or wheezing. Takes Advair once a day.  Past Medical History  Diagnosis Date  . CAD (coronary artery disease)     CABG '97, SVG DES 12/13  . Hyperlipidemia   . BPH (benign prostatic hyperplasia)   . Colonic polyp   . History of pancreatitis     pt denies this hx on 11/02/2013  . Hearing loss   . Acute CHF 09/15/2012  . Ischemic cardiomyopathy 09/16/2012    EF 25%- improved to 45% 3/14  . PVD (peripheral vascular disease)     moderate bilat carotid disease  . Myocardial infarction 1997  . Exertional shortness of breath   . Type II diabetes mellitus   . GERD (gastroesophageal reflux disease)   . Asthma     pt denies this hx on 11/02/2013  . Syncope and collapse 11/02/2013    "didn't pass out" (11/02/2013)  . Hypertension   . 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 Coronary artery bypass graft  1997    "CABG X 5"  . Shoulder open rotator cuff repair Right   . Nasal septum surgery  2007  . Cholecystectomy  2000's  . Inguinal hernia repair Bilateral ~ 1950  . Coronary angioplasty with stent placement  Dec 2013    DES to SVG-CFX/PDA  . Cataract extraction w/  intraocular lens  implant, bilateral Bilateral ~ 1970  . Retinal detachment surgery Left ?1995  . Left heart catheterization with coronary/graft angiogram N/A 09/16/2012    Procedure: LEFT HEART CATHETERIZATION WITH Beatrix Fetters;  Surgeon: Lorretta Harp, MD;  Location: [facility] CATH LAB;  Service: Cardiovascular;  Laterality: N/A;    Social History   Social History  . Marital Status: Married    Spouse Name: N/A  . Number of Children: 1  . Years of Education: N/A   Occupational History  . 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 Smoking status: Former Smoker    Types: Cigars  . Smokeless tobacco: Never Used     Comment: 11/02/2013 "smoked cigars when I was 18 or so; didn't smoke many"  . Alcohol Use: 1.2 oz/week    2 Glasses of wine per week  . Drug Use: No  . Sexual Activity: No   Other Topics Concern  . Not on file   Social History Narrative   Married '50-24 yrs, divorced; married '82   1 son- '51   Retired but keeps up [employer], mows   End of Life: no CPR, no heroic or futile measures  Medication List       This list is accurate as of: 06/28/15 10:36 AM.  Always use your most recent med list.               acetaminophen 325 MG tablet  Commonly known as:  TYLENOL  Take 2 tablets (650 mg total) by mouth every 4 (four) hours as needed.     Adjustable Lancing Device Misc     Alcohol Prep 70 % Pads     aspirin 81 MG EC tablet  Take 2 tablets (162 mg total) by mouth daily.     blood glucose meter kit and supplies  Use as instructed     clopidogrel 75 MG tablet  Commonly known as:  PLAVIX  Take 1 tablet (75 mg total) by mouth daily with breakfast.     doxazosin 4 MG tablet  Commonly known as:  CARDURA  Take 4 mg by mouth at bedtime.     ezetimibe 10 MG tablet  Commonly known as:  ZETIA  Take 1 tablet (10 mg total) by mouth daily.     finasteride 5 MG tablet  Commonly known as:  PROSCAR  Take 5 mg  by mouth daily.     Fluticasone-Salmeterol 100-50 MCG/DOSE Aepb  Commonly known as:  ADVAIR  Inhale 1 puff into the lungs 2 (two) times daily as needed (shortness of breath).     furosemide 20 MG tablet  Commonly known as:  LASIX  Take 1 tablet (20 mg total) by mouth daily.     glucose blood test strip  Use to test blood sugars twice daily     insulin glargine 100 UNIT/ML injection  Commonly known as:  LANTUS  Inject 7 Units into the skin at bedtime.     Insulin Pen Needle 31G X 8 MM Misc  Use as directed     lisinopril 5 MG tablet  Commonly known as:  PRINIVIL,ZESTRIL  Take 1 tablet (5 mg total) by mouth daily.     metoprolol tartrate 25 MG tablet  Commonly known as:  LOPRESSOR  Take 0.5 tablets (12.5 mg total) by mouth 2 (two) times daily.     mometasone 50 MCG/ACT nasal spray  Commonly known as:  NASONEX  USE 2 SPRAYS NASALLY DAILY AS DIRECTED     nitroGLYCERIN 0.4 MG SL tablet  Commonly known as:  NITROSTAT  Place 1 tablet (0.4 mg total) under the tongue every 5 (five) minutes x 3 doses as needed for chest pain.     pantoprazole 40 MG tablet  Commonly known as:  PROTONIX  Take 1 tablet (40 mg total) by mouth daily.     pioglitazone 30 MG tablet  Commonly known as:  ACTOS  Take 1 tablet (30 mg total) by mouth daily.     rosuvastatin 10 MG tablet  Commonly known as:  CRESTOR  Take 1 tablet (10 mg total) by mouth daily.     SYSTANE BALANCE OP  Apply 2 drops to eye. 2 drops each eye daily           Objective:   Physical Exam BP 128/76 mmHg  Pulse 76  Temp(Src) 98.1 F (36.7 C) (Oral)  Ht '5\' 8"'  (1.727 m)  Wt 199 lb 6 oz (90.436 kg)  BMI 30.32 kg/m2  SpO2 96% General:   Well developed, well nourished . NAD.  HEENT:  Normocephalic . Face symmetric, atraumatic Neck: No JVD at 47 Lungs:  CTA B Normal respiratory effort, no intercostal  retractions, no accessory muscle use. Heart: RRR,  no murmur.  Trace  pretibial edema bilaterally  Skin: Not  pale. Not jaundice Neurologic:  alert & oriented X3.  Speech normal, gait appropriate for age and unassisted Psych--  Cognition and judgment appear intact.  Cooperative with normal attention span and concentration.  Behavior appropriate. No anxious or depressed appearing.      Assessment & Plan:   Problem list > Diabetes Hyperlipidemia Hypertension CV: --CAD, CABG 1997 --Ischemic cardiomyopathy, CHF --Peripheral vascular disease Asthma Hearing loss   A/P  Recently seen with fatigue, symptoms resolve, labs normal CAD, CHF: Reports DOE, of recent onset, sometimes with few steps, but symptoms are not consistent every time he walks. EKG is at baseline. D/t h/o CAD and diabetes sx could be d/t angina equivalent. No recent objective eval of his heart. He also has asthma. Plan: Chest x-ray, refer to cardiology Primary care: High-dose flu shot Follow-up 4 months

## 2015-06-28 NOTE — Patient Instructions (Addendum)
  Stop by the first floor and get the XR    Next visit  for a routine checkup in 4 months  Please schedule an appointment at the front desk No need to come back fasting

## 2015-06-28 NOTE — Progress Notes (Signed)
Pre visit review using our clinic review tool, if applicable. No additional management support is needed unless otherwise documented below in the visit note. 

## 2015-06-29 ENCOUNTER — Encounter: Payer: Self-pay | Admitting: Internal Medicine

## 2015-06-29 ENCOUNTER — Other Ambulatory Visit: Payer: Self-pay

## 2015-06-29 DIAGNOSIS — I10 Essential (primary) hypertension: Secondary | ICD-10-CM

## 2015-06-29 MED ORDER — FUROSEMIDE 20 MG PO TABS: ORAL_TABLET | ORAL | Status: DC

## 2015-07-01 ENCOUNTER — Other Ambulatory Visit: Payer: Self-pay

## 2015-07-01 ENCOUNTER — Encounter: Payer: Self-pay | Admitting: Cardiovascular Disease

## 2015-07-01 ENCOUNTER — Encounter: Payer: Self-pay | Admitting: Internal Medicine

## 2015-07-01 MED ORDER — METOPROLOL TARTRATE 25 MG PO TABS: 12.5000 mg | ORAL_TABLET | Freq: Two times a day (BID) | ORAL | Status: DC

## 2015-07-02 ENCOUNTER — Emergency Department (HOSPITAL_COMMUNITY)
Admission: EM | Admit: 2015-07-02 | Discharge: 2015-07-02 | Disposition: A | Discharge status: Home / Self Care | Payer: Medicare Other | Attending: Emergency Medicine | Admitting: Emergency Medicine

## 2015-07-02 ENCOUNTER — Emergency Department (HOSPITAL_COMMUNITY): Payer: Medicare Other

## 2015-07-02 ENCOUNTER — Encounter (HOSPITAL_COMMUNITY): Payer: Self-pay

## 2015-07-02 DIAGNOSIS — Z87891 Personal history of nicotine dependence: Secondary | ICD-10-CM | POA: Diagnosis not present

## 2015-07-02 DIAGNOSIS — Z7982 Long term (current) use of aspirin: Secondary | ICD-10-CM | POA: Insufficient documentation

## 2015-07-02 DIAGNOSIS — Z951 Presence of aortocoronary bypass graft: Secondary | ICD-10-CM | POA: Insufficient documentation

## 2015-07-02 DIAGNOSIS — I2510 Atherosclerotic heart disease of native coronary artery without angina pectoris: Secondary | ICD-10-CM | POA: Diagnosis not present

## 2015-07-02 DIAGNOSIS — Z9861 Coronary angioplasty status: Secondary | ICD-10-CM | POA: Insufficient documentation

## 2015-07-02 DIAGNOSIS — Z7951 Long term (current) use of inhaled steroids: Secondary | ICD-10-CM | POA: Insufficient documentation

## 2015-07-02 DIAGNOSIS — I10 Essential (primary) hypertension: Secondary | ICD-10-CM | POA: Insufficient documentation

## 2015-07-02 DIAGNOSIS — Z79899 Other long term (current) drug therapy: Secondary | ICD-10-CM | POA: Insufficient documentation

## 2015-07-02 DIAGNOSIS — Z794 Long term (current) use of insulin: Secondary | ICD-10-CM | POA: Insufficient documentation

## 2015-07-02 DIAGNOSIS — Z86010 Personal history of colonic polyps: Secondary | ICD-10-CM | POA: Diagnosis not present

## 2015-07-02 DIAGNOSIS — R0602 Shortness of breath: Secondary | ICD-10-CM | POA: Diagnosis present

## 2015-07-02 DIAGNOSIS — J45901 Unspecified asthma with (acute) exacerbation: Secondary | ICD-10-CM | POA: Diagnosis not present

## 2015-07-02 DIAGNOSIS — I252 Old myocardial infarction: Secondary | ICD-10-CM | POA: Insufficient documentation

## 2015-07-02 DIAGNOSIS — K219 Gastro-esophageal reflux disease without esophagitis: Secondary | ICD-10-CM | POA: Diagnosis not present

## 2015-07-02 DIAGNOSIS — Z87438 Personal history of other diseases of male genital organs: Secondary | ICD-10-CM | POA: Diagnosis not present

## 2015-07-02 DIAGNOSIS — H9190 Unspecified hearing loss, unspecified ear: Secondary | ICD-10-CM | POA: Diagnosis not present

## 2015-07-02 DIAGNOSIS — E785 Hyperlipidemia, unspecified: Secondary | ICD-10-CM | POA: Insufficient documentation

## 2015-07-02 DIAGNOSIS — E119 Type 2 diabetes mellitus without complications: Secondary | ICD-10-CM | POA: Diagnosis not present

## 2015-07-02 DIAGNOSIS — I509 Heart failure, unspecified: Secondary | ICD-10-CM | POA: Diagnosis not present

## 2015-07-02 LAB — CBC
HEMATOCRIT: 35.8 % — AB (ref 39.0–52.0)
Hemoglobin: 12.2 g/dL — ABNORMAL LOW (ref 13.0–17.0)
MCH: 31 pg (ref 26.0–34.0)
MCHC: 34.1 g/dL (ref 30.0–36.0)
MCV: 90.9 fL (ref 78.0–100.0)
PLATELETS: 163 10*3/uL (ref 150–400)
RBC: 3.94 MIL/uL — AB (ref 4.22–5.81)
RDW: 14.5 % (ref 11.5–15.5)
WBC: 6.3 10*3/uL (ref 4.0–10.5)

## 2015-07-02 LAB — I-STAT TROPONIN, ED: TROPONIN I, POC: 0.01 ng/mL (ref 0.00–0.08)

## 2015-07-02 LAB — TROPONIN I
Troponin I: <0.03 ng/mL (ref <0.031)
Troponin I: <0.03 ng/mL (ref <0.031)

## 2015-07-02 LAB — BASIC METABOLIC PANEL
Anion gap: 9 (ref 5–15)
BUN: 9 mg/dL (ref 6–20)
CO2: 25 mmol/L (ref 22–32)
CREATININE: 0.77 mg/dL (ref 0.61–1.24)
Calcium: 9 mg/dL (ref 8.9–10.3)
Chloride: 100 mmol/L — ABNORMAL LOW (ref 101–111)
Glucose, Bld: 113 mg/dL — ABNORMAL HIGH (ref 65–99)
POTASSIUM: 3.6 mmol/L (ref 3.5–5.1)
SODIUM: 134 mmol/L — AB (ref 135–145)

## 2015-07-02 LAB — BRAIN NATRIURETIC PEPTIDE: B NATRIURETIC PEPTIDE 5: 501.9 pg/mL — AB (ref 0.0–100.0)

## 2015-07-02 MED ORDER — FUROSEMIDE 20 MG PO TABS: 20.0000 mg | ORAL_TABLET | Freq: Two times a day (BID) | ORAL | Status: DC

## 2015-07-02 MED ORDER — FUROSEMIDE 10 MG/ML IJ SOLN
40.0000 mg | Freq: Once | INTRAMUSCULAR | Status: AC
  Administered 2015-07-02: 40 mg via INTRAVENOUS
  Filled 2015-07-02: qty 4

## 2015-07-02 NOTE — ED Notes (Signed)
Pt ambulated in hallway - SpO2 89-90% with slight dyspnea. Pt does not c/o feeling short of breath. EDP aware.

## 2015-07-02 NOTE — ED Notes (Addendum)
Pt c/o dyspnea when waking up in the mornings x 2-3 months, with worsening dyspnea this morning. Denies any pain. Pt hx of bypass surgery 4-5 years ago. Hx hypertension, hyperlipidemia, diabetes.  After wife arrived, more info provided: pt seen at PCP earlier this week and x-ray showed CHF with some fluid on lungs. Pt daily dosage of lasix increased from 1 pill per day to 3 pills per day for the remainder of the week. Pt still c/o shortness of breath. Lungs clear to ausculation.

## 2015-07-02 NOTE — ED Provider Notes (Signed)
CSN: [id number]     Arrival date & time 07/02/15  0736 History   First MD Initiated Contact with Patient 07/02/15 [phone]     No chief complaint on file.    (Consider location/radiation/quality/duration/timing/severity/associated sxs/prior Treatment) Patient is a 81 y.o. male presenting with shortness of breath.  Shortness of Breath Severity:  Moderate Onset quality:  Gradual Duration:  2 weeks Timing:  Constant Progression:  Worsening Chronicity:  New Context: activity   Context: not fumes, not occupational exposure and not pollens   Ineffective treatments:  Diuretics Associated symptoms: cough and PND   Associated symptoms: no abdominal pain, no chest pain, no fever, no headaches, no neck pain, no sore throat and no sputum production     Past Medical History  Diagnosis Date  . CAD (coronary artery disease)     CABG '97, SVG DES 12/13  . Hyperlipidemia   . BPH (benign prostatic hyperplasia)   . Colonic polyp   . History of pancreatitis     pt denies this hx on 11/02/2013  . Hearing loss   . Acute CHF 09/15/2012  . Ischemic cardiomyopathy 09/16/2012    EF 25%- improved to 45% 3/14  . PVD (peripheral vascular disease)     moderate bilat carotid disease  . Myocardial infarction 1997  . Exertional shortness of breath   . Type II diabetes mellitus   . GERD (gastroesophageal reflux disease)   . Asthma     pt denies this hx on 11/02/2013  . Syncope and collapse 11/02/2013    "didn't pass out" (11/02/2013)  . Hypertension   . Intrinsic asthma 06/18/2007    Qualifier: Diagnosis of  By: Dance CMA (AAMA), Kim     Past Surgical History  Procedure Laterality Date  . Coronary artery bypass graft  1997    "CABG X 5"  . Shoulder open rotator cuff repair Right   . Nasal septum surgery  2007  . Cholecystectomy  2000's  . Inguinal hernia repair Bilateral ~ 1950  . Coronary angioplasty with stent placement  Dec 2013    DES to SVG-CFX/PDA  . Cataract extraction w/ intraocular lens   implant, bilateral Bilateral ~ 1970  . Retinal detachment surgery Left ?1995  . Left heart catheterization with coronary/graft angiogram N/A 09/16/2012    Procedure: LEFT HEART CATHETERIZATION WITH Beatrix Fetters;  Surgeon: Lorretta Harp, MD;  Location: [facility] CATH LAB;  Service: Cardiovascular;  Laterality: N/A;   Family History  Problem Relation Age of Onset  . Colon cancer Neg Hx   . Prostate cancer Brother     dx ~ 63 y/o   Social History  Substance Use Topics  . Smoking status: Former Smoker    Types: Cigars  . Smokeless tobacco: Never Used     Comment: 11/02/2013 "smoked cigars when I was 18 or so; didn't smoke many"  . Alcohol Use: 1.2 oz/week    2 Glasses of wine per week    Review of Systems  Constitutional: Negative for fever and chills.  HENT: Negative for sore throat.   Eyes: Negative for pain.  Respiratory: Positive for cough and shortness of breath. Negative for sputum production.   Cardiovascular: Positive for PND. Negative for chest pain.  Gastrointestinal: Negative for abdominal pain.  Endocrine: Negative for polydipsia and polyuria.  Genitourinary: Negative for urgency and decreased urine volume.  Musculoskeletal: Negative for neck pain.  Neurological: Negative for headaches.  All other systems reviewed and are negative.     Allergies  Colesevelam and Ezetimibe-simvastatin  Home Medications   Prior to Admission medications   Medication Sig Start Date End Date Taking? Authorizing Provider  acetaminophen (TYLENOL) 325 MG tablet Take 2 tablets (650 mg total) by mouth every 4 (four) hours as needed. 09/18/12   Erlene Quan, PA-C  Alcohol Swabs (ALCOHOL PREP) 70 % PADS  02/23/14   Historical Provider, MD  aspirin EC 81 MG EC tablet Take 2 tablets (162 mg total) by mouth daily. 09/18/12   Erlene Quan, PA-C  Blood Glucose Monitoring Suppl (BLOOD GLUCOSE METER) kit Use as instructed Patient not taking: Reported on 03/29/2015 04/08/13   Neena Rhymes,  MD  clopidogrel (PLAVIX) 75 MG tablet Take 1 tablet (75 mg total) by mouth daily with breakfast. 01/24/15   Lorretta Harp, MD  doxazosin (CARDURA) 4 MG tablet Take 4 mg by mouth at bedtime.      Historical Provider, MD  ezetimibe (ZETIA) 10 MG tablet Take 1 tablet (10 mg total) by mouth daily. 02/09/15   Lorretta Harp, MD  finasteride (PROSCAR) 5 MG tablet Take 5 mg by mouth daily.    Historical Provider, MD  Fluticasone-Salmeterol (ADVAIR) 100-50 MCG/DOSE AEPB Inhale 1 puff into the lungs 2 (two) times daily as needed (shortness of breath). 12/17/14   Colon Branch, MD  furosemide (LASIX) 20 MG tablet Take 1 tablet by mouth three times daily for three (3) days , then take 1 tablet by mouth twice daily. 06/29/15   Colon Branch, MD  glucose blood test strip Use to test blood sugars twice daily Patient not taking: Reported on 03/29/2015 02/01/14   Neena Rhymes, MD  insulin glargine (LANTUS) 100 UNIT/ML injection Inject 7 Units into the skin at bedtime.    Historical Provider, MD  Insulin Pen Needle 31G X 8 MM MISC Use as directed Patient not taking: Reported on 03/29/2015 12/07/13   Neena Rhymes, MD  Lancet Devices (ADJUSTABLE LANCING DEVICE) [place]  02/23/14   Historical Provider, MD  lisinopril (PRINIVIL,ZESTRIL) 5 MG tablet Take 1 tablet (5 mg total) by mouth daily. 02/09/15   Lorretta Harp, MD  metoprolol tartrate (LOPRESSOR) 25 MG tablet Take 0.5 tablets (12.5 mg total) by mouth 2 (two) times daily. 07/01/15   Lorretta Harp, MD  mometasone (NASONEX) 50 MCG/ACT nasal spray USE 2 SPRAYS NASALLY DAILY AS DIRECTED 09/06/14   Colon Branch, MD  nitroGLYCERIN (NITROSTAT) 0.4 MG SL tablet Place 1 tablet (0.4 mg total) under the tongue every 5 (five) minutes x 3 doses as needed for chest pain. Patient not taking: Reported on 03/29/2015 09/18/12   Erlene Quan, PA-C  pantoprazole (PROTONIX) 40 MG tablet Take 1 tablet (40 mg total) by mouth daily. 02/09/15   Lorretta Harp, MD  pioglitazone (ACTOS) 30 MG  tablet Take 1 tablet (30 mg total) by mouth daily. 03/29/15   Colon Branch, MD  Propylene Glycol (SYSTANE BALANCE OP) Apply 2 drops to eye. 2 drops each eye daily    Historical Provider, MD  rosuvastatin (CRESTOR) 10 MG tablet Take 1 tablet (10 mg total) by mouth daily. 05/09/15   Lorretta Harp, MD   There were no vitals taken for this visit. Physical Exam  Constitutional: He is oriented to person, place, and time. He appears well-developed and well-nourished.  HENT:  Head: Normocephalic and atraumatic.  Eyes: Conjunctivae and EOM are normal.  Neck: Normal range of motion. Neck supple.  Cardiovascular: Normal rate, regular rhythm  and normal heart sounds.   No murmur heard. Pulmonary/Chest: Effort normal. No respiratory distress. He has rales (bilateral bases L>R).  Abdominal: Soft. He exhibits no distension. There is no tenderness. There is no rebound and no guarding.  Musculoskeletal: Normal range of motion. He exhibits no edema or tenderness.  Neurological: He is alert and oriented to person, place, and time.  Skin: Skin is warm and dry.  Nursing note and vitals reviewed.   ED Course  Procedures (including critical care time) Labs Review Labs Reviewed  BASIC METABOLIC PANEL - Abnormal; Notable for the following:    Sodium 134 (*)    Chloride 100 (*)    Glucose, Bld 113 (*)    All other components within normal limits  CBC - Abnormal; Notable for the following:    RBC 3.94 (*)    Hemoglobin 12.2 (*)    HCT 35.8 (*)    All other components within normal limits  BRAIN NATRIURETIC PEPTIDE - Abnormal; Notable for the following:    B Natriuretic Peptide 501.9 (*)    All other components within normal limits  TROPONIN I  TROPONIN I  I-STAT TROPOININ, ED    Imaging Review Dg Chest 2 View  07/02/2015   CLINICAL DATA:  Shortness of breath for 1 month worse this morning  EXAM: CHEST  2 VIEW  COMPARISON:  06/28/2015  FINDINGS: Mild to moderate cardiac enlargement. Aortic  calcification. Vascular congestion. Mild to moderate interstitial prominence. Small bilateral pleural effusions. Status post prior CABG.  IMPRESSION: Unchanged from prior study with evidence of congestive heart failure and mild interstitial pulmonary edema   Electronically Signed   By: Skipper Cliche M.D.   On: 07/02/2015 08:46   I have personally reviewed and evaluated these images and lab results as part of my medical decision-making.   EKG Interpretation   Date/Time:  Saturday July 02 2015 07:43:42 EDT Ventricular Rate:  81 PR Interval:  213 QRS Duration: 93 QT Interval:  385 QTC Calculation: 447 R Axis:   70 Text Interpretation:  Sinus rhythm Borderline prolonged PR interval  Anteroseptal infarct, old Borderline ST depression, lateral leads Baseline  wander in lead(s) V6 Confirmed by [facility] MD, Corene Cornea [id number]) on 07/02/2015  7:59:48 AM      MDM   Final diagnoses:  Acute on chronic congestive heart failure, unspecified congestive heart failure type   Likely CHF exacerbation, not improving on home lasix increase. Will check labs, xr, give IV lasix and attempt ambulation to see if patient is able to handle it without dyspnea or desaturation.   Labs and imaging consistent with mild acute on chronic CHF exacerbation. Given a IV dose Lasix and later on the department. Patient had some sats of 89 and 90% but no increased dyspnea. Shared decision making with the patient and he elected to go home with another increase his Lasix. They will come back in 3 days if his symptoms are not improved and at that time will likely need to be admitted for continued IV Lasix. Patient has an appointment with his cardiologist on Friday and will discuss the increased Lasix with him. Also the need for a new echocardiogram.  I have personally and contemperaneously reviewed labs and imaging and used in my decision making as above.   A medical screening exam was performed and I feel the patient has had an  appropriate workup for their chief complaint at this time and likelihood of emergent condition existing is low. They have been counseled on  decision, discharge, follow up and which symptoms necessitate immediate return to the emergency department. They or their family verbally stated understanding and agreement with plan and discharged in stable condition.      Merrily Pew, MD 07/02/15 [phone]

## 2015-07-04 ENCOUNTER — Encounter: Payer: Self-pay | Admitting: Internal Medicine

## 2015-07-04 ENCOUNTER — Encounter: Payer: Self-pay | Admitting: Cardiology

## 2015-07-06 ENCOUNTER — Other Ambulatory Visit (INDEPENDENT_AMBULATORY_CARE_PROVIDER_SITE_OTHER): Payer: Medicare Other

## 2015-07-06 DIAGNOSIS — I10 Essential (primary) hypertension: Secondary | ICD-10-CM

## 2015-07-06 LAB — BASIC METABOLIC PANEL
BUN: 16 mg/dL (ref 6–23)
CO2: 31 mEq/L (ref 19–32)
Calcium: 9 mg/dL (ref 8.4–10.5)
Chloride: 97 mEq/L (ref 96–112)
Creatinine, Ser: 0.86 mg/dL (ref 0.40–1.50)
GFR: 89.35 mL/min (ref >60.00)
GLUCOSE: 89 mg/dL (ref 70–99)
POTASSIUM: 4 meq/L (ref 3.5–5.1)
SODIUM: 133 meq/L — AB (ref 135–145)

## 2015-07-08 ENCOUNTER — Encounter: Payer: Self-pay | Admitting: Cardiology

## 2015-07-08 ENCOUNTER — Ambulatory Visit (INDEPENDENT_AMBULATORY_CARE_PROVIDER_SITE_OTHER): Payer: Medicare Other | Admitting: Cardiology

## 2015-07-08 VITALS — BP 116/48 | HR 76 | Ht 68.0 in | Wt 190.3 lb

## 2015-07-08 DIAGNOSIS — Z9889 Other specified postprocedural states: Secondary | ICD-10-CM | POA: Diagnosis not present

## 2015-07-08 DIAGNOSIS — G4730 Sleep apnea, unspecified: Secondary | ICD-10-CM

## 2015-07-08 DIAGNOSIS — Z951 Presence of aortocoronary bypass graft: Secondary | ICD-10-CM | POA: Diagnosis not present

## 2015-07-08 DIAGNOSIS — I5042 Chronic combined systolic (congestive) and diastolic (congestive) heart failure: Secondary | ICD-10-CM | POA: Insufficient documentation

## 2015-07-08 DIAGNOSIS — I5043 Acute on chronic combined systolic (congestive) and diastolic (congestive) heart failure: Secondary | ICD-10-CM | POA: Diagnosis not present

## 2015-07-08 DIAGNOSIS — I255 Ischemic cardiomyopathy: Secondary | ICD-10-CM | POA: Diagnosis not present

## 2015-07-08 DIAGNOSIS — Z95820 Peripheral vascular angioplasty status with implants and grafts: Secondary | ICD-10-CM

## 2015-07-08 MED ORDER — FUROSEMIDE 20 MG PO TABS
20.0000 mg | ORAL_TABLET | Freq: Every day | ORAL | Status: DC
Start: 2015-07-08 — End: 2015-08-17

## 2015-07-08 NOTE — Assessment & Plan Note (Signed)
Consider repeating echo if recurrent CHF

## 2015-07-08 NOTE — Patient Instructions (Signed)
Your physician has recommended you make the following change in your medication: Take 20mg  of Furosemide daily except 40mg  on Mondays/Wednesdays/Fridays.  Your physician recommends that you schedule a follow-up appointment in: 6-8 weeks with Dr. Allyson Sabal.  Call the office if you gain 5 or more pounds in a week.

## 2015-07-08 NOTE — Assessment & Plan Note (Signed)
Stent to prox SVG to LCX-PDA 09/26/12

## 2015-07-08 NOTE — Assessment & Plan Note (Signed)
CABG x 5 1997 

## 2015-07-08 NOTE — Assessment & Plan Note (Signed)
Medications Basal insulin, Actos

## 2015-07-08 NOTE — Progress Notes (Signed)
07/08/2015 [name].   01/16/1926  [id number]  Primary Physician Kathlene November, MD Primary Cardiologist: Dr Gwenlyn Found  HPI:  Pleasant 81 y/o male with a history of CABG in '97. He was admitted in December 2013 with [place]. He underwent cath and subsequent SVG-CFX/PDA DES. His LV was depressed at that time and he went home with a Life Vest but fortunately his EF improved to 45% by echo March 2014 and Life Vest was discontinued. He saw Dr Gwenlyn Found in June and was doing well. This past Saturday he went to the ED at [facility] with dyspnea. His BNP was 500 and he had CHF on CXR. He was given IV Lasix in the ED and his home Lasix dose (20 mg daily) was increased to 40 mg BID. He is in the office today for follow up. He feels fine now, now dyspnea. He denies orthopnea or LE edema. He admits he was using the salt shaker without restriction.    Current Outpatient Prescriptions  Medication Sig Dispense Refill  . acetaminophen (TYLENOL) 325 MG tablet Take 2 tablets (650 mg total) by mouth every 4 (four) hours as needed.    . Alcohol Swabs (ALCOHOL PREP) 70 % PADS     . aspirin EC 81 MG EC tablet Take 2 tablets (162 mg total) by mouth daily.    . Blood Glucose Monitoring Suppl (BLOOD GLUCOSE METER) kit Use as instructed 1 each 0  . clopidogrel (PLAVIX) 75 MG tablet Take 1 tablet (75 mg total) by mouth daily with breakfast. 90 tablet 3  . doxazosin (CARDURA) 4 MG tablet Take 2 mg by mouth at bedtime.     Marland Kitchen ezetimibe (ZETIA) 10 MG tablet Take 1 tablet (10 mg total) by mouth daily. 90 tablet 3  . finasteride (PROSCAR) 5 MG tablet Take 5 mg by mouth daily.    . Fluticasone-Salmeterol (ADVAIR) 100-50 MCG/DOSE AEPB Inhale 1 puff into the lungs 2 (two) times daily as needed (shortness of breath). 180 each 3  . glucose blood test strip Use to test blood sugars twice daily 100 each 5  . Grape Seed 100 MG CAPS Take 2 capsules by mouth daily.    . insulin glargine (LANTUS) 100 UNIT/ML injection Inject 7 Units into the skin  at bedtime.    . insulin glargine (LANTUS) 100 UNIT/ML injection Inject 9 Units into the skin at bedtime.    . Insulin Pen Needle 31G X 8 MM MISC Use as directed 50 each 1  . Lancet Devices (ADJUSTABLE LANCING DEVICE) MISC     . lisinopril (PRINIVIL,ZESTRIL) 5 MG tablet Take 1 tablet (5 mg total) by mouth daily. 90 tablet 3  . metoprolol tartrate (LOPRESSOR) 25 MG tablet Take 0.5 tablets (12.5 mg total) by mouth 2 (two) times daily. 90 tablet 3  . mometasone (NASONEX) 50 MCG/ACT nasal spray USE 2 SPRAYS NASALLY DAILY AS DIRECTED 17 g 4  . pantoprazole (PROTONIX) 40 MG tablet Take 1 tablet (40 mg total) by mouth daily. 90 tablet 3  . pioglitazone (ACTOS) 30 MG tablet Take 1 tablet (30 mg total) by mouth daily. 90 tablet 2  . Propylene Glycol (SYSTANE BALANCE OP) Apply 2 drops to eye. 2 drops each eye daily    . rosuvastatin (CRESTOR) 10 MG tablet Take 1 tablet (10 mg total) by mouth daily. 90 tablet 3  . furosemide (LASIX) 20 MG tablet Take 1 tablet (20 mg total) by mouth daily. Take 38m daily except 440mon Mondays, Wednesdays  and Fridays. 130 tablet 3  . [DISCONTINUED] nitroGLYCERIN (NITROSTAT) 0.4 MG SL tablet Place 1 tablet (0.4 mg total) under the tongue every 5 (five) minutes x 3 doses as needed for chest pain. (Patient not taking: Reported on 03/29/2015) 25 tablet 2   No current facility-administered medications for this visit.    Allergies  Allergen Reactions  . Colesevelam     unknown  . Ezetimibe-Simvastatin     unknown    Social History   Social History  . Marital Status: Married    Spouse Name: N/A  . Number of Children: 1  . Years of Education: N/A   Occupational History  . 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 Smoking status: Former Smoker    Types: Cigars  . Smokeless tobacco: Never Used     Comment: 11/02/2013 "smoked cigars when I was 18 or so; didn't smoke many"  . Alcohol Use: 1.2 oz/week    2 Glasses of wine per week  .  Drug Use: No  . Sexual Activity: No   Other Topics Concern  . Not on file   Social History Narrative   Married '50-24 yrs, divorced; married '82   1 son- '51   Retired but keeps up 4 rental houses, mows   End of Life: no CPR, no heroic or futile measures     Review of Systems: General: negative for chills, fever, night sweats or weight changes.  Cardiovascular: negative for chest pain, dyspnea on exertion, edema, orthopnea, palpitations, paroxysmal nocturnal dyspnea or shortness of breath Dermatological: negative for rash Respiratory: negative for cough or wheezing Urologic: negative for hematuria Abdominal: negative for nausea, vomiting, diarrhea, bright red blood per rectum, melena, or hematemesis Neurologic: negative for visual changes, syncope, or dizziness All other systems reviewed and are otherwise negative except as noted above.    Blood pressure 116/48, pulse 76, height '5\' 8"'  (1.727 m), weight 190 lb 4.8 oz (86.32 kg).  General appearance: alert, cooperative, no distress and mildly obese Neck: no carotid bruit and no JVD Lungs: clear to auscultation bilaterally Heart: regular rate and rhythm and soft sytolic murmur AOV area Extremities: no edema Skin: Skin color, texture, turgor normal. No rashes or lesions Neurologic: Grossly normal   ASSESSMENT AND PLAN:   Acute on chronic combined systolic and diastolic congestive heart failure Pt seen in ED 07/02/15- Lasix increased with 7 lb wgt loss and improvement in symptoms. Pt admitted to unrestricted salt use.  ICM, EF 20-25% by cath 09/16/12- improved to 45 % by Echo 3/14 Consider repeating echo if recurrent CHF  Hx of CABG CABG x 5 1997   S/P angioplasty with stent  Stent to prox SVG to LCX-PDA 09/26/12   Sleep apnea- C-pap on C-pap  DM (diabetes mellitus) Medications Basal insulin, Actos   PLAN  I cut his Lasix back to 40 mg MWF and 20 mg other days. I discussed the importance of sodium restriction in his  diet with he and his wife. I suggested they contact us if his wgt goes up more than 5 lbs in a week. I would like him to f/u with dr berry in a few weeks to make sure he is doing well. If he has recurrent CHF we should probably re check his echo.   Kerin Ransom K PA-C 07/08/2015 10:25 AM

## 2015-07-08 NOTE — Assessment & Plan Note (Addendum)
Pt seen in ED 07/02/15- Lasix increased with 7 lb wgt loss and improvement in symptoms. Pt admitted to unrestricted salt use.

## 2015-07-08 NOTE — Assessment & Plan Note (Signed)
on C-pap

## 2015-08-17 ENCOUNTER — Ambulatory Visit (INDEPENDENT_AMBULATORY_CARE_PROVIDER_SITE_OTHER): Payer: Medicare Other | Admitting: Cardiovascular Disease

## 2015-08-17 ENCOUNTER — Encounter: Payer: Self-pay | Admitting: Cardiovascular Disease

## 2015-08-17 VITALS — BP 110/50 | HR 68 | Ht 68.0 in | Wt 197.0 lb

## 2015-08-17 DIAGNOSIS — I5043 Acute on chronic combined systolic (congestive) and diastolic (congestive) heart failure: Secondary | ICD-10-CM

## 2015-08-17 DIAGNOSIS — E785 Hyperlipidemia, unspecified: Secondary | ICD-10-CM

## 2015-08-17 DIAGNOSIS — I255 Ischemic cardiomyopathy: Secondary | ICD-10-CM | POA: Diagnosis not present

## 2015-08-17 MED ORDER — FUROSEMIDE 40 MG PO TABS: 40.0000 mg | ORAL_TABLET | Freq: Every day | ORAL | Status: DC

## 2015-08-17 NOTE — Assessment & Plan Note (Signed)
History of ischemic cardiopathy with his most recent ejection fraction measured by 2-D echo March 2014 of 45%. He does admit to dietary indiscretion and has class III symptoms. I am going to increase his Lasix from 40 mg alternating with 20 mg on a daily basis to 20 mg a day. We'll check a basic metabolic panel and BNP in several weeks and he will follow-up with Corine Shelter PAC in 3 months.

## 2015-08-17 NOTE — Assessment & Plan Note (Signed)
History of CAD status post coronary artery bypass grafting in 1997 with a LIMA to his LAD, vein graft to first branch, obtuse marginal branch and PDA. He has had intervention to his SVG to PDA with a drug-eluting stent which time his EF was in the 20th 25% range. He was placed on a LifeVest and ultimately his EF improved to 45% range. He denies chest pain but does have significant dyspnea on exertion.

## 2015-08-17 NOTE — Patient Instructions (Signed)
Medication Instructions:  Your physician has recommended you make the following change in your medication:  1) INCREASE Lasix to 40 mg by mouth ONCE daily   Labwork: Your physician recommends that you return for lab work in: 2 weeks (BMET/BNP) The lab can be found on the FIRST FLOOR of out building in [address]   Testing/Procedures: none  Follow-Up: We request that you follow-up in: 3 months with [name] and in 6 months with [name] will receive a reminder letter in the mail two months in advance. If you don't receive a letter, please call our office to schedule the follow-up appointment.    Any Other Special Instructions Will Be Listed Below (If Applicable).     If you need a refill on your cardiac medications before your next appointment, please call your pharmacy.

## 2015-08-17 NOTE — Assessment & Plan Note (Signed)
History of moderate carotid artery disease with recent duplex performed 05/05/15 revealing moderate right and mild left ICA stenosis. He is neurologically asymptomatic which we are following this, manual basis

## 2015-08-17 NOTE — Progress Notes (Signed)
08/17/2015 [name].   [birth year]  [id number]  Primary Physician Kathlene November, MD Primary Cardiologist: Lorretta Harp MD Renae Gloss   HPI:  The patient returns today for followup. He was last seen in our office by Kerin Ransom, PA-C, 6 weeks ago and by myself 3 months  ago.Ryan KitchenHe is an 81 year old married Caucasian male with a history of CAD status post coronary artery bypass grafting in 1997 with a LIMA to his LAD, a vein to a first diagonal branch, OM and PDA. His other problems include hypertension, hyperlipidemia and noninsulin-requiring diabetes. He has known peripheral vascular with moderate bilateral internal carotid artery stenosis which we are following by duplex ultrasound. He had a Myoview performed September 20, 2011, which showed scar in the LAD territory unchanged from prior studies with an EF of 35% to 45% by 2D echo. He presented to [facility] in early December with shortness of breath and congestive heart failure and was transferred to [facility] for further evaluation. He did have slightly elevated troponins and catheterization revealed stenosis in his SVG to his PDA which was stented with a DES stent. His EF at that time was 20% to 25% and a LifeVest was placed. He has done well since that time and is relatively asymptomatic. Limited echo today revealed an EF in the 45% range. Since I saw him back in 3 months ago he has seen Kerin Ransom has increased his Lasix. He continues to have dyspnea on exertion with class III symptoms and admits to dietary indiscretion with regards to salt  Current Outpatient Prescriptions  Medication Sig Dispense Refill  . acetaminophen (TYLENOL) 325 MG tablet Take 2 tablets (650 mg total) by mouth every 4 (four) hours as needed.    . Alcohol Swabs (ALCOHOL PREP) 70 % PADS     . aspirin EC 81 MG EC tablet Take 2 tablets (162 mg total) by mouth daily.    . Blood Glucose Monitoring Suppl (BLOOD GLUCOSE METER) kit Use as instructed 1 each 0    . clopidogrel (PLAVIX) 75 MG tablet Take 1 tablet (75 mg total) by mouth daily with breakfast. 90 tablet 3  . doxazosin (CARDURA) 4 MG tablet Take 2 mg by mouth at bedtime.     [name] ezetimibe (ZETIA) 10 MG tablet Take 1 tablet (10 mg total) by mouth daily. 90 tablet 3  . finasteride (PROSCAR) 5 MG tablet Take 5 mg by mouth daily.    . Fluticasone-Salmeterol (ADVAIR) 100-50 MCG/DOSE AEPB Inhale 1 puff into the lungs 2 (two) times daily as needed (shortness of breath). 180 each 3  . furosemide (LASIX) 40 MG tablet Take 1 tablet (40 mg total) by mouth daily. Take 55m daily except 481mon Mondays, Wednesdays and Fridays. 90 tablet 3  . glucose blood test strip Use to test blood sugars twice daily 100 each 5  . Grape Seed 100 MG CAPS Take 2 capsules by mouth daily.    . insulin glargine (LANTUS) 100 UNIT/ML injection Inject 7 Units into the skin at bedtime.    . Insulin Pen Needle 31G X 8 MM MISC Use as directed 50 each 1  . Lancet Devices (ADJUSTABLE LANCING DEVICE) MISC     . lisinopril (PRINIVIL,ZESTRIL) 5 MG tablet Take 1 tablet (5 mg total) by mouth daily. 90 tablet 3  . metoprolol tartrate (LOPRESSOR) 25 MG tablet Take 0.5 tablets (12.5 mg total) by mouth 2 (two) times daily. 90 tablet 3  . mometasone (NASONEX) 50  MCG/ACT nasal spray USE 2 SPRAYS NASALLY DAILY AS DIRECTED 17 g 4  . pantoprazole (PROTONIX) 40 MG tablet Take 1 tablet (40 mg total) by mouth daily. 90 tablet 3  . pioglitazone (ACTOS) 30 MG tablet Take 1 tablet (30 mg total) by mouth daily. 90 tablet 2  . Propylene Glycol (SYSTANE BALANCE OP) Apply 2 drops to eye. 2 drops each eye daily    . rosuvastatin (CRESTOR) 10 MG tablet Take 1 tablet (10 mg total) by mouth daily. 90 tablet 3  . [DISCONTINUED] nitroGLYCERIN (NITROSTAT) 0.4 MG SL tablet Place 1 tablet (0.4 mg total) under the tongue every 5 (five) minutes x 3 doses as needed for chest pain. (Patient not taking: Reported on 03/29/2015) 25 tablet 2   No current  facility-administered medications for this visit.    Allergies  Allergen Reactions  . Colesevelam     unknown  . Ezetimibe-Simvastatin     unknown    Social History   Social History  . Marital Status: Married    Spouse Name: N/A  . Number of Children: 1  . Years of Education: N/A   Occupational History  . 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 Smoking status: Former Smoker    Types: Cigars  . Smokeless tobacco: Never Used     Comment: 11/02/2013 "smoked cigars when I was 18 or so; didn't smoke many"  . Alcohol Use: 1.2 oz/week    2 Glasses of wine per week  . Drug Use: No  . Sexual Activity: No   Other Topics Concern  . Not on file   Social History Narrative   Married '50-24 yrs, divorced; married '82   1 son- '51   Retired but keeps up 4 rental houses, mows   End of Life: no CPR, no heroic or futile measures     Review of Systems: General: negative for chills, fever, night sweats or weight changes.  Cardiovascular: negative for chest pain, dyspnea on exertion, edema, orthopnea, palpitations, paroxysmal nocturnal dyspnea or shortness of breath Dermatological: negative for rash Respiratory: negative for cough or wheezing Urologic: negative for hematuria Abdominal: negative for nausea, vomiting, diarrhea, bright red blood per rectum, melena, or hematemesis Neurologic: negative for visual changes, syncope, or dizziness All other systems reviewed and are otherwise negative except as noted above.    Blood pressure 110/50, pulse 68, height '5\' 8"'  (1.727 m), weight 197 lb (89.359 kg).  General appearance: alert and no distress Neck: no adenopathy, no carotid bruit, no JVD, supple, symmetrical, trachea midline and thyroid not enlarged, symmetric, no tenderness/mass/nodules Lungs: clear to auscultation bilaterally Heart: regular rate and rhythm, S1, S2 normal, no murmur, click, rub or gallop Extremities: extremities normal, atraumatic,  no cyanosis or edema  EKG not performed today  ASSESSMENT AND PLAN:   S/P angioplasty with stent  History of CAD status post coronary artery bypass grafting in 1997 with a LIMA to his LAD, vein graft to first branch, obtuse marginal branch and PDA. He has had intervention to his SVG to PDA with a drug-eluting stent which time his EF was in the 20th 25% range. He was placed on a LifeVest and ultimately his EF improved to 45% range. He denies chest pain but does have significant dyspnea on exertion.  PVD- moderate carotid disease History of moderate carotid artery disease with recent duplex performed 05/05/15 revealing moderate right and mild left ICA stenosis. He is neurologically asymptomatic which we  are following this, manual basis  ICM, EF 20-25% by cath 09/16/12- improved to 45 % by Echo 3/14 History of ischemic cardiopathy with his most recent ejection fraction measured by 2-D echo March 2014 of 45%. He does admit to dietary indiscretion and has class III symptoms. I am going to increase his Lasix from 40 mg alternating with 20 mg on a daily basis to 20 mg a day. We'll check a basic metabolic panel and BNP in several weeks and he will follow-up with Kerin Ransom PAC in 3 months.  Hyperlipidemia History of hyperlipidemia on Crestor and Zetia with recent lipid profile performed 01/04/15 revealing a total cholesterol 117, LDL 62 HDL of 43      Lorretta Harp MD [facility], [facility] 08/17/2015 11:25 AM

## 2015-08-17 NOTE — Assessment & Plan Note (Signed)
History of hyperlipidemia on Crestor and Zetia with recent lipid profile performed 01/04/15 revealing a total cholesterol 117, LDL 62 HDL of 43

## 2015-08-18 ENCOUNTER — Telehealth: Payer: Self-pay | Admitting: Cardiovascular Disease

## 2015-08-18 NOTE — Telephone Encounter (Signed)
Dawn is calling to verify the directions on the pt's Furosemide prescription. Please f/u with her  Thanks

## 2015-08-18 NOTE — Telephone Encounter (Signed)
Med instructions/dispense directions clarified w/ pharmacy.

## 2015-08-25 ENCOUNTER — Other Ambulatory Visit: Payer: Self-pay | Admitting: Internal Medicine

## 2015-08-28 ENCOUNTER — Other Ambulatory Visit: Payer: Self-pay | Admitting: Internal Medicine

## 2015-08-29 ENCOUNTER — Ambulatory Visit: Payer: PRIVATE HEALTH INSURANCE | Admitting: Internal Medicine

## 2015-08-29 ENCOUNTER — Encounter: Payer: Self-pay | Admitting: Internal Medicine

## 2015-09-01 LAB — BASIC METABOLIC PANEL
BUN: 12 mg/dL (ref 7–25)
CHLORIDE: 95 mmol/L — AB (ref 98–110)
CO2: 27 mmol/L (ref 20–31)
CREATININE: 0.71 mg/dL (ref 0.70–1.11)
Calcium: 9 mg/dL (ref 8.6–10.3)
Glucose, Bld: 143 mg/dL — ABNORMAL HIGH (ref 65–99)
Potassium: 4.1 mmol/L (ref 3.5–5.3)
Sodium: 132 mmol/L — ABNORMAL LOW (ref 135–146)

## 2015-09-01 LAB — BRAIN NATRIURETIC PEPTIDE: Brain Natriuretic Peptide: 559.1 pg/mL — ABNORMAL HIGH (ref 0.0–100.0)

## 2015-09-06 ENCOUNTER — Other Ambulatory Visit: Payer: Self-pay | Admitting: Internal Medicine

## 2015-09-15 ENCOUNTER — Encounter: Payer: Self-pay | Admitting: Cardiovascular Disease

## 2015-10-14 ENCOUNTER — Telehealth: Payer: Self-pay | Admitting: *Deleted

## 2015-10-14 NOTE — Telephone Encounter (Signed)
Received fax from [facility] for patient's diabetic testing supplies; forwarded to provider for signature/SLS

## 2015-10-25 ENCOUNTER — Encounter: Payer: Self-pay | Admitting: Cardiovascular Disease

## 2015-10-28 ENCOUNTER — Encounter: Payer: Self-pay | Admitting: Cardiology

## 2015-10-28 ENCOUNTER — Other Ambulatory Visit: Payer: Self-pay | Admitting: Cardiology

## 2015-10-31 ENCOUNTER — Encounter: Payer: Self-pay | Admitting: Cardiology

## 2015-10-31 ENCOUNTER — Ambulatory Visit (INDEPENDENT_AMBULATORY_CARE_PROVIDER_SITE_OTHER): Payer: Medicare Other | Admitting: Cardiology

## 2015-10-31 VITALS — BP 112/50 | HR 80 | Ht 67.0 in | Wt 193.9 lb

## 2015-10-31 DIAGNOSIS — Z951 Presence of aortocoronary bypass graft: Secondary | ICD-10-CM | POA: Diagnosis not present

## 2015-10-31 DIAGNOSIS — E1159 Type 2 diabetes mellitus with other circulatory complications: Secondary | ICD-10-CM

## 2015-10-31 DIAGNOSIS — G4730 Sleep apnea, unspecified: Secondary | ICD-10-CM

## 2015-10-31 DIAGNOSIS — Z959 Presence of cardiac and vascular implant and graft, unspecified: Secondary | ICD-10-CM

## 2015-10-31 DIAGNOSIS — I255 Ischemic cardiomyopathy: Secondary | ICD-10-CM | POA: Diagnosis not present

## 2015-10-31 DIAGNOSIS — Z794 Long term (current) use of insulin: Secondary | ICD-10-CM

## 2015-10-31 DIAGNOSIS — Z79899 Other long term (current) drug therapy: Secondary | ICD-10-CM | POA: Diagnosis not present

## 2015-10-31 DIAGNOSIS — Z95820 Peripheral vascular angioplasty status with implants and grafts: Secondary | ICD-10-CM

## 2015-10-31 DIAGNOSIS — E785 Hyperlipidemia, unspecified: Secondary | ICD-10-CM

## 2015-10-31 LAB — BASIC METABOLIC PANEL
BUN: 18 mg/dL (ref 7–25)
CO2: 26 mmol/L (ref 20–31)
Calcium: 9 mg/dL (ref 8.6–10.3)
Chloride: 95 mmol/L — ABNORMAL LOW (ref 98–110)
Creat: 0.77 mg/dL (ref 0.70–1.11)
Glucose, Bld: 125 mg/dL — ABNORMAL HIGH (ref 65–99)
Potassium: 4.2 mmol/L (ref 3.5–5.3)
Sodium: 131 mmol/L — ABNORMAL LOW (ref 135–146)

## 2015-10-31 MED ORDER — FUROSEMIDE 40 MG PO TABS: 40.0000 mg | ORAL_TABLET | Freq: Every day | ORAL | Status: DC

## 2015-10-31 MED ORDER — ROSUVASTATIN CALCIUM 10 MG PO TABS: 10.0000 mg | ORAL_TABLET | Freq: Every day | ORAL | Status: DC

## 2015-10-31 NOTE — Progress Notes (Signed)
10/31/2015 [name].   [birth year]  [id number]  Primary Physician Kathlene November, MD Primary Cardiologist: Dr Gwenlyn Found  HPI:  82 y/o male with a history of CABG in '97. He was admitted in December 2013 with [place]. He underwent cath and subsequent SVG-CFX/PDA DES. His LV was depressed at that time and he went home with a Life Vest but fortunately his EF improved to 45% by echo March 2014 and Life Vest was discontinued. This past Nov he had some volume overload and Dr Gwenlyn Found adjusted his Lasix to 40 mg daily. He is in the office for follow up. He is doing well, no edema. He and his wife are hoping to go to their house at [facility] for a few days.    Current Outpatient Prescriptions  Medication Sig Dispense Refill  . acetaminophen (TYLENOL) 325 MG tablet Take 2 tablets (650 mg total) by mouth every 4 (four) hours as needed.    . Alcohol Swabs (ALCOHOL PREP) 70 % PADS     . aspirin EC 81 MG EC tablet Take 2 tablets (162 mg total) by mouth daily.    . Blood Glucose Monitoring Suppl (BLOOD GLUCOSE METER) kit Use as instructed 1 each 0  . clopidogrel (PLAVIX) 75 MG tablet Take 1 tablet (75 mg total) by mouth daily with breakfast. 90 tablet 3  . doxazosin (CARDURA) 4 MG tablet Take 2 mg by mouth at bedtime.     . finasteride (PROSCAR) 5 MG tablet Take 5 mg by mouth daily.    . Fluticasone-Salmeterol (ADVAIR) 100-50 MCG/DOSE AEPB Inhale 1 puff into the lungs 2 (two) times daily as needed (shortness of breath). 180 each 3  . furosemide (LASIX) 40 MG tablet Take 1 tablet (40 mg total) by mouth daily. 30 tablet 3  . glucose blood test strip Use to test blood sugars twice daily 100 each 5  . Grape Seed 100 MG CAPS Take 2 capsules by mouth daily.    . Insulin Glargine (LANTUS SOLOSTAR) 100 UNIT/ML Solostar Pen Inject 7 Units into the skin at bedtime. 30 mL 3  . Insulin Pen Needle 31G X 8 MM MISC Use as directed 50 each 1  . Lancet Devices (ADJUSTABLE LANCING DEVICE) MISC     . lisinopril  (PRINIVIL,ZESTRIL) 5 MG tablet Take 1 tablet (5 mg total) by mouth daily. 90 tablet 3  . metoprolol tartrate (LOPRESSOR) 25 MG tablet Take 0.5 tablets (12.5 mg total) by mouth 2 (two) times daily. 90 tablet 3  . mometasone (NASONEX) 50 MCG/ACT nasal spray Place 2 sprays into the nose daily. 17 g 6  . pantoprazole (PROTONIX) 40 MG tablet Take 1 tablet (40 mg total) by mouth daily. 90 tablet 3  . pioglitazone (ACTOS) 30 MG tablet Take 1 tablet (30 mg total) by mouth daily. 90 tablet 2  . Propylene Glycol (SYSTANE BALANCE OP) Apply 2 drops to eye. 2 drops each eye daily    . rosuvastatin (CRESTOR) 10 MG tablet Take 1 tablet (10 mg total) by mouth daily. 90 tablet 3  . ZETIA 10 MG tablet Take 10 mg by mouth daily.    . [DISCONTINUED] nitroGLYCERIN (NITROSTAT) 0.4 MG SL tablet Place 1 tablet (0.4 mg total) under the tongue every 5 (five) minutes x 3 doses as needed for chest pain. (Patient not taking: Reported on 03/29/2015) 25 tablet 2   No current facility-administered medications for this visit.    Allergies  Allergen Reactions  . Colesevelam  unknown  . Ezetimibe-Simvastatin     unknown    Social History   Social History  . Marital Status: Married    Spouse Name: N/A  . Number of Children: 1  . Years of Education: N/A   Occupational History  . 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 Smoking status: Former Smoker    Types: Cigars  . Smokeless tobacco: Never Used     Comment: 11/02/2013 "smoked cigars when I was 18 or so; didn't smoke many"  . Alcohol Use: 1.2 oz/week    2 Glasses of wine per week  . Drug Use: No  . Sexual Activity: No   Other Topics Concern  . Not on file   Social History Narrative   Married '50-24 yrs, divorced; married '82   1 son- '51   Retired but keeps up 4 rental houses, mows   End of Life: no CPR, no heroic or futile measures     Review of Systems: General: negative for chills, fever, night sweats or weight  changes.  Cardiovascular: negative for chest pain, dyspnea on exertion, edema, orthopnea, palpitations, paroxysmal nocturnal dyspnea or shortness of breath Dermatological: negative for rash Respiratory: negative for cough or wheezing Urologic: negative for hematuria Abdominal: negative for nausea, vomiting, diarrhea, bright red blood per rectum, melena, or hematemesis Neurologic: negative for visual changes, syncope, or dizziness All other systems reviewed and are otherwise negative except as noted above.    Blood pressure 112/50, pulse 80, height '5\' 7"'  (1.702 m), weight 193 lb 14.4 oz (87.952 kg).  General appearance: alert, cooperative, no distress and mildly obese Neck: no carotid bruit and no JVD Lungs: clear to auscultation bilaterally Heart: regular rate and rhythm Extremities: no edema Neurologic: Grossly normal   ASSESSMENT AND PLAN:   Acute on chronic combined systolic and diastolic congestive heart failure Diuretics adjusted in Nov. Pt reminded to avoid salt use.  ICM, EF 20-25% by cath 09/16/12- improved to 45 % by Echo 3/14 Consider repeating echo if recurrent CHF on current lasix dose  Hx of CABG CABG x 5 1997   S/P angioplasty with stent  Stent to prox SVG to LCX-PDA 09/26/12   Sleep apnea- C-pap on C-pap  DM (diabetes mellitus) MedicationsBasal insulin, Actos  PLAN  Check BMP today. The pt tells me his insurance company is no longer covering Crestor and want him to change to Vytorin. The pt has had pancreatitis secondary to simvastatin in the past so this will not be possible, we'll contact his insurance complany. Crestor is now generic so I'm not sure why they wouldn't switch him to that.   Promyse Ardito K PA-C 10/31/2015 11:12 AM

## 2015-10-31 NOTE — Patient Instructions (Signed)
Your physician recommends that you return for lab work in: today at [facility] lab on the first floor  Your physician recommends that you schedule a follow-up appointment in: 6 months with Dr. Allyson Sabal

## 2015-11-01 ENCOUNTER — Encounter: Payer: Self-pay | Admitting: Internal Medicine

## 2015-11-01 ENCOUNTER — Ambulatory Visit (INDEPENDENT_AMBULATORY_CARE_PROVIDER_SITE_OTHER): Payer: Medicare Other | Admitting: Internal Medicine

## 2015-11-01 VITALS — BP 118/58 | HR 67 | Temp 98.2°F | Ht 68.0 in | Wt 193.5 lb

## 2015-11-01 DIAGNOSIS — E1159 Type 2 diabetes mellitus with other circulatory complications: Secondary | ICD-10-CM | POA: Diagnosis not present

## 2015-11-01 DIAGNOSIS — E119 Type 2 diabetes mellitus without complications: Secondary | ICD-10-CM | POA: Diagnosis not present

## 2015-11-01 DIAGNOSIS — I255 Ischemic cardiomyopathy: Secondary | ICD-10-CM | POA: Diagnosis not present

## 2015-11-01 DIAGNOSIS — I5042 Chronic combined systolic (congestive) and diastolic (congestive) heart failure: Secondary | ICD-10-CM

## 2015-11-01 DIAGNOSIS — Z794 Long term (current) use of insulin: Secondary | ICD-10-CM | POA: Diagnosis not present

## 2015-11-01 DIAGNOSIS — Z09 Encounter for follow-up examination after completed treatment for conditions other than malignant neoplasm: Secondary | ICD-10-CM

## 2015-11-01 DIAGNOSIS — I25709 Atherosclerosis of coronary artery bypass graft(s), unspecified, with unspecified angina pectoris: Secondary | ICD-10-CM

## 2015-11-01 LAB — AST: AST: 14 U/L (ref 10–35)

## 2015-11-01 LAB — ALT: ALT: 11 U/L (ref 9–46)

## 2015-11-01 MED ORDER — METFORMIN HCL 850 MG PO TABS: 850.0000 mg | ORAL_TABLET | Freq: Two times a day (BID) | ORAL | Status: DC

## 2015-11-01 NOTE — Progress Notes (Signed)
Subjective:    Patient ID: Ryan Plumb., male    DOB: 05/28/1926, 82 y.o.   MRN: [id number]  DOS:  11/01/2015 Type of visit - description : Follow-up Interval history: CAD, CHF: Since the last time I saw him, went to the ER and subsequently saw cardiology. He was volume overloaded, doing better. DM: Currently on Lantus 7 to 12 units at nighttime "depending on my sugar". Also on Actos. CBGs in the morning range from 88-120, in the afternoon is usually in the upper 100s. He had 2 episodes of hypoglycemia -cbg around 48-  over the last 10 days. High cholesterol, good compliance with medication  Wt Readings from Last 3 Encounters:  11/01/15 193 lb 8 oz (87.771 kg)  10/31/15 193 lb 14.4 oz (87.952 kg)  08/17/15 197 lb (89.359 kg)     Review of Systems Denies chest pain, no edema.   No nausea, vomiting, diarrhea  Past Medical History  Diagnosis Date  . CAD (coronary artery disease)     CABG '97, SVG DES 12/13  . Hyperlipidemia   . BPH (benign prostatic hyperplasia)   . Colonic polyp   . History of pancreatitis     pt denies this hx on 11/02/2013  . Hearing loss   . Acute CHF ([place]) 09/15/2012  . Ischemic cardiomyopathy 09/16/2012    EF 25%- improved to 45% 3/14  . PVD (peripheral vascular disease) ([facility])     moderate bilat carotid disease  . Myocardial infarction ([place]) 1997  . Exertional shortness of breath   . Type II diabetes mellitus ([place])   . GERD (gastroesophageal reflux disease)   . Asthma     pt denies this hx on 11/02/2013  . Syncope and collapse 11/02/2013    "didn't pass out" (11/02/2013)  . Hypertension   . 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 Coronary artery bypass graft  1997    "CABG X 5"  . Shoulder open rotator cuff repair Right   . Nasal septum surgery  2007  . Cholecystectomy  2000's  . Inguinal hernia repair Bilateral ~ 1950  . Coronary angioplasty  with stent placement  Dec 2013    DES to SVG-CFX/PDA  . Cataract extraction w/ intraocular lens  implant, bilateral Bilateral ~ 1970  . Retinal detachment surgery Left ?1995  . Left heart catheterization with coronary/graft angiogram N/A 09/16/2012    Procedure: LEFT HEART CATHETERIZATION WITH Beatrix Fetters;  Surgeon: Lorretta Harp, MD;  Location: [facility] CATH LAB;  Service: Cardiovascular;  Laterality: N/A;    Social History   Social History  . Marital Status: Married    Spouse Name: N/A  . Number of Children: 1  . Years of Education: N/A   Occupational History  . 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 Smoking status: Former Smoker    Types: Cigars  . Smokeless tobacco: Never Used     Comment: 11/02/2013 "smoked cigars when I was 18 or so; didn't smoke many"  . Alcohol Use: 1.2 oz/week    2 Glasses of wine per week  . Drug Use: No  . Sexual Activity: No   Other Topics Concern  . Not on file   Social History Narrative   Married '50-24 yrs, divorced; married '82   1 son- '51   Retired  but keeps up 4 rental houses, mows   End of Life: no CPR, no heroic or futile measures        Medication List       This list is accurate as of: 11/01/15 11:59 PM.  Always use your most recent med list.               acetaminophen 325 MG tablet  Commonly known as:  TYLENOL  Take 2 tablets (650 mg total) by mouth every 4 (four) hours as needed.     Adjustable Lancing Device Misc  Reported on 11/01/2015     Alcohol Prep 70 % Pads  Reported on 11/01/2015     aspirin 81 MG EC tablet  Take 2 tablets (162 mg total) by mouth daily.     blood glucose meter kit and supplies  Use as instructed     clopidogrel 75 MG tablet  Commonly known as:  PLAVIX  Take 1 tablet (75 mg total) by mouth daily with breakfast.     doxazosin 4 MG tablet  Commonly known as:  CARDURA  Take 2 mg by mouth at bedtime.     finasteride 5 MG tablet  Commonly known  as:  PROSCAR  Take 5 mg by mouth daily.     Fluticasone-Salmeterol 100-50 MCG/DOSE Aepb  Commonly known as:  ADVAIR  Inhale 1 puff into the lungs 2 (two) times daily as needed (shortness of breath).     furosemide 40 MG tablet  Commonly known as:  LASIX  Take 1 tablet (40 mg total) by mouth daily.     glucose blood test strip  Use to test blood sugars twice daily     Grape Seed 100 MG Caps  Take 2 capsules by mouth daily.     lisinopril 5 MG tablet  Commonly known as:  PRINIVIL,ZESTRIL  Take 1 tablet (5 mg total) by mouth daily.     metFORMIN 850 MG tablet  Commonly known as:  GLUCOPHAGE  Take 1 tablet (850 mg total) by mouth 2 (two) times daily with a meal.     metoprolol tartrate 25 MG tablet  Commonly known as:  LOPRESSOR  Take 0.5 tablets (12.5 mg total) by mouth 2 (two) times daily.     mometasone 50 MCG/ACT nasal spray  Commonly known as:  NASONEX  Place 2 sprays into the nose daily.     pantoprazole 40 MG tablet  Commonly known as:  PROTONIX  Take 1 tablet (40 mg total) by mouth daily.     rosuvastatin 10 MG tablet  Commonly known as:  CRESTOR  Take 1 tablet (10 mg total) by mouth daily.     SYSTANE BALANCE OP  Apply 2 drops to eye. 2 drops each eye daily     ZETIA 10 MG tablet  Generic drug:  ezetimibe  Take 10 mg by mouth daily.           Objective:   Physical Exam BP 118/58 mmHg  Pulse 67  Temp(Src) 98.2 F (36.8 C) (Oral)  Ht _0  (1.727 m)  Wt 193 lb 8 oz (87.771 kg)  BMI 29.43 kg/m2  SpO2 98% General:   Well developed, well nourished . NAD.  HEENT:  Normocephalic . Face symmetric, atraumatic Lungs:  CTA B Normal respiratory effort, no intercostal retractions, no accessory muscle use. Heart: RRR,  no murmur.  No pretibial edema bilaterally  Diabetic feet exam: Skin normal, pinprick examination normal, bilateral pedal pulses present Neurologic:  alert & oriented X3.  Speech normal, gait appropriate for age and unassisted Psych--    Cognition and judgment appear intact.  Cooperative with normal attention span and concentration.  Behavior appropriate. No anxious or depressed appearing.      Assessment & Plan:   Assessment > Diabetes, no neuropathy as of 10-2015 10-2015: d/c Lantus, Actos. Start metformin Hyperlipidemia Hypertension CV: --CAD, CABG 7373 --CHF: Systolic, diastolic, EF echocardiogram 2014---45% --Peripheral vascular disease OSA: On CPAP Asthma Hearing loss  PLAN: CAD, CHF: Since the last visit this is better, still has occasional DOE. By cardiology. DM: Insurance request to change Lantus however he is describing episodes of hypoglycemia. Also on  Actos and has a h/o  CHF. Plan: A1c, AST, ALT; DC Lantus, Actos. Start metformin. Call with readings in 2 weeks. Feet exam normal RTC 3 months.  Today, I spent more than 25   min with the patient: >50% of the time counseling regards the  treatment plan for diabetes, explaining him the rationale behind discontinue Lantus, Actos and started metformin. We also discussed  hypoglycemic symptoms. Also: -reviewing the chart and labs ordered by other providers

## 2015-11-01 NOTE — Progress Notes (Signed)
Pre visit review using our clinic review tool, if applicable. No additional management support is needed unless otherwise documented below in the visit note. 

## 2015-11-01 NOTE — Patient Instructions (Signed)
BEFORE YOU LEAVE THE OFFICE: GO TO THE LAB  Get the blood work    GO TO THE FRONT DESK Schedule a routine office visit or check up to be done in  3 months  no fasting    AFTER YOU LEAVE THE OFFICE: Stop Actos Start Lantus Start metformin 850 mg: Half tablet   twice a day for one week, then one tablet twice a day  Diabetes: Check your blood sugar  once a day   Check your blood sugar  at different times of the day  GOALS: Fasting before a meal 70- 130 2 hours after a meal less than 180 At bedtime 90-150   Call your blood sugar readings in 2 weeks

## 2015-11-02 ENCOUNTER — Encounter: Payer: Self-pay | Admitting: Cardiology

## 2015-11-02 LAB — HEMOGLOBIN A1C
HEMOGLOBIN A1C: 6.8 % — AB (ref <5.7)
MEAN PLASMA GLUCOSE: 148 mg/dL — AB (ref <117)

## 2015-11-18 ENCOUNTER — Encounter: Payer: Self-pay | Admitting: Internal Medicine

## 2015-11-18 NOTE — Telephone Encounter (Signed)
Left message for pt to call back r/t prior authorization of Crestor.  When applying for Crestor appeals, Laurena Bering stated pt had picked up, without need of PA, the generic for medication with $15 copay on 10/31/2015.  Pt will only need PA for name brand of medication

## 2015-11-21 MED ORDER — ROSUVASTATIN CALCIUM 10 MG PO TABS: 10.0000 mg | ORAL_TABLET | Freq: Every day | ORAL | Status: DC

## 2015-11-21 NOTE — Addendum Note (Signed)
Addended by: Rosalee Kaufman. on: 11/21/2015 11:16 AM   Modules accepted: Orders

## 2015-11-21 NOTE — Telephone Encounter (Signed)
Per insurance message, they will cover generic rosuvastatin.  Sent rx to [facility].  Wife to call if not covered

## 2015-11-22 ENCOUNTER — Encounter: Payer: Self-pay | Admitting: Internal Medicine

## 2015-11-23 MED ORDER — METFORMIN HCL 850 MG PO TABS: 1275.0000 mg | ORAL_TABLET | Freq: Two times a day (BID) | ORAL | Status: DC

## 2015-11-23 NOTE — Telephone Encounter (Signed)
Metformin 850 mg increased to 1.5 tablets by mouth twice daily and sent to [facility].

## 2015-11-28 ENCOUNTER — Encounter: Payer: Self-pay | Admitting: Internal Medicine

## 2015-11-28 ENCOUNTER — Ambulatory Visit (INDEPENDENT_AMBULATORY_CARE_PROVIDER_SITE_OTHER): Payer: Medicare Other | Admitting: Internal Medicine

## 2015-11-28 ENCOUNTER — Ambulatory Visit (HOSPITAL_BASED_OUTPATIENT_CLINIC_OR_DEPARTMENT_OTHER)
Admission: RE | Admit: 2015-11-28 | Discharge: 2015-11-28 | Disposition: A | Discharge status: Home / Self Care | Payer: Medicare Other | Source: Ambulatory Visit | Attending: Internal Medicine | Admitting: Internal Medicine

## 2015-11-28 VITALS — BP 126/74 | HR 74 | Temp 97.6°F | Ht 68.0 in | Wt 183.0 lb

## 2015-11-28 DIAGNOSIS — Z794 Long term (current) use of insulin: Secondary | ICD-10-CM | POA: Diagnosis not present

## 2015-11-28 DIAGNOSIS — E1159 Type 2 diabetes mellitus with other circulatory complications: Secondary | ICD-10-CM

## 2015-11-28 DIAGNOSIS — R634 Abnormal weight loss: Secondary | ICD-10-CM | POA: Diagnosis not present

## 2015-11-28 DIAGNOSIS — I255 Ischemic cardiomyopathy: Secondary | ICD-10-CM | POA: Diagnosis not present

## 2015-11-28 DIAGNOSIS — Z09 Encounter for follow-up examination after completed treatment for conditions other than malignant neoplasm: Secondary | ICD-10-CM | POA: Insufficient documentation

## 2015-11-28 DIAGNOSIS — R079 Chest pain, unspecified: Secondary | ICD-10-CM | POA: Diagnosis not present

## 2015-11-28 DIAGNOSIS — R109 Unspecified abdominal pain: Secondary | ICD-10-CM

## 2015-11-28 LAB — CBC WITH DIFFERENTIAL/PLATELET
BASOS ABS: 0.1 10*3/uL (ref 0.0–0.1)
Basophils Relative: 0.5 % (ref 0.0–3.0)
Eosinophils Absolute: 0.9 10*3/uL — ABNORMAL HIGH (ref 0.0–0.7)
Eosinophils Relative: 9 % — ABNORMAL HIGH (ref 0.0–5.0)
HEMATOCRIT: 40.6 % (ref 39.0–52.0)
HEMOGLOBIN: 13.7 g/dL (ref 13.0–17.0)
LYMPHS PCT: 15.7 % (ref 12.0–46.0)
Lymphs Abs: 1.5 10*3/uL (ref 0.7–4.0)
MCHC: 33.8 g/dL (ref 30.0–36.0)
MCV: 92.7 fl (ref 78.0–100.0)
MONOS PCT: 10.4 % (ref 3.0–12.0)
Monocytes Absolute: 1 10*3/uL (ref 0.1–1.0)
NEUTROS PCT: 64.4 % (ref 43.0–77.0)
Neutro Abs: 6.3 10*3/uL (ref 1.4–7.7)
Platelets: 192 10*3/uL (ref 150.0–400.0)
RBC: 4.39 Mil/uL (ref 4.22–5.81)
RDW: 14.5 % (ref 11.5–15.5)
WBC: 9.7 10*3/uL (ref 4.0–10.5)

## 2015-11-28 LAB — URINALYSIS, ROUTINE W REFLEX MICROSCOPIC
BILIRUBIN URINE: NEGATIVE
KETONES UR: NEGATIVE
LEUKOCYTES UA: NEGATIVE
Nitrite: NEGATIVE
PH: 5 (ref 5.0–8.0)
Specific Gravity, Urine: 1.015 (ref 1.000–1.030)
Total Protein, Urine: NEGATIVE
UROBILINOGEN UA: 0.2 (ref 0.0–1.0)
Urine Glucose: NEGATIVE
WBC UA: NONE SEEN (ref 0–?)

## 2015-11-28 LAB — TSH: TSH: 1.17 u[IU]/mL (ref 0.35–4.50)

## 2015-11-28 LAB — AST: AST: 20 U/L (ref 0–37)

## 2015-11-28 LAB — ALT: ALT: 19 U/L (ref 0–53)

## 2015-11-28 NOTE — Patient Instructions (Addendum)
GO TO THE LAB : Get the blood work     STOP BY THE FIRST FLOOR:  get the XR    Call back on Lantus 7 units every day  Call your blood sugar readings in one week  Call will the alternative insulin your insurance is asking you to switch to  [facility] see you in April.

## 2015-11-28 NOTE — Assessment & Plan Note (Signed)
PLAN 11-28-15  Chest wall pain: Pain seems to be MSK in nature, will get x-ray to rule out a lytic lesion and other etiologies. Weight loss: 10 pounds since the last time he was here, he is actually feeling well, I wonder if the wt loss is related to stopping Actos or hyperglycemia. Will check an CBC, TSH, LFTs. UA. Reassess in 2 months DM: Recently changed from Lantus-Actos to metformin, CBGs in the 180s or 190s. Plan- restart Lantus. He has a leftover but will call within the name of the  long acting insulin his insurance will cover. RTC as scheduled April 2017.

## 2015-11-28 NOTE — Assessment & Plan Note (Signed)
PLAN: 11-01-15 CAD, CHF: Since the last visit this is better, still has occasional DOE. By cardiology. DM: Insurance request to change Lantus however he is describing episodes of hypoglycemia. Also on  Actos and has a h/o  CHF. Plan: A1c, AST, ALT; DC Lantus, Actos. Start metformin. Call with readings in 2 weeks. Feet exam normal RTC 3 months.

## 2015-11-28 NOTE — Progress Notes (Signed)
Pre visit review using our clinic review tool, if applicable. No additional management support is needed unless otherwise documented below in the visit note. 

## 2015-11-28 NOTE — Progress Notes (Signed)
Subjective:    Patient ID: [name]., male    DOB: Jul 09, 1926, 82 y.o.   MRN: [id number]  DOS:  11/28/2015 Type of visit - description : Acute visit, here with his wife Interval history: Complaining of left-sided chest pain for 2 weeks, "is sore to touch, I think is my rib". Denies any fall or injury, no rash. I noted some weight loss, he reports his appetite has decrease, no apparent explanation he is simply not that hungry. Denies postprandial abdominal pain. He also states that his stools have been loose for the last 4-5 days. Ambulatory blood sugars are running 180, 190 fasting or not. Good compliance with metformin.   Wt Readings from Last 3 Encounters:  11/28/15 183 lb (83.008 kg)  11/01/15 193 lb 8 oz (87.771 kg)  10/31/15 193 lb 14.4 oz (87.952 kg)      Review of Systems Denies fever, chills or night sweats. No nausea, vomiting, blood in the stools. No dysphagia or odynophagia. No cough or sputum production No anxiety or depression No gross hematuria.   Past Medical History  Diagnosis Date  . CAD (coronary artery disease)     CABG '97, SVG DES 12/13  . Hyperlipidemia   . BPH (benign prostatic hyperplasia)   . Colonic polyp   . History of pancreatitis     pt denies this hx on 11/02/2013  . Hearing loss   . Acute CHF ([place]) 09/15/2012  . Ischemic cardiomyopathy 09/16/2012    EF 25%- improved to 45% 3/14  . PVD (peripheral vascular disease) ([facility])     moderate bilat carotid disease  . Myocardial infarction ([place]) 1997  . Exertional shortness of breath   . Type II diabetes mellitus ([place])   . GERD (gastroesophageal reflux disease)   . Asthma     pt denies this hx on 11/02/2013  . Syncope and collapse 11/02/2013    "didn't pass out" (11/02/2013)  . Hypertension   . 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 Coronary artery bypass graft  1997    "CABG X 5"  .  Shoulder open rotator cuff repair Right   . Nasal septum surgery  2007  . Cholecystectomy  2000's  . Inguinal hernia repair Bilateral ~ 1950  . Coronary angioplasty with stent placement  Dec 2013    DES to SVG-CFX/PDA  . Cataract extraction w/ intraocular lens  implant, bilateral Bilateral ~ 1970  . Retinal detachment surgery Left ?1995  . Left heart catheterization with coronary/graft angiogram N/A 09/16/2012    Procedure: LEFT HEART CATHETERIZATION WITH Beatrix Fetters;  Surgeon: Lorretta Harp, MD;  Location: [facility] CATH LAB;  Service: Cardiovascular;  Laterality: N/A;    Social History   Social History  . Marital Status: Married    Spouse Name: N/A  . Number of Children: 1  . Years of Education: N/A   Occupational History  . 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 Smoking status: Former Smoker    Types: Cigars  . Smokeless tobacco: Never Used     Comment: 11/02/2013 "smoked cigars when I was 18 or so; didn't smoke many"  . Alcohol Use: 1.2 oz/week    2 Glasses of wine per week  . Drug Use: No  . Sexual Activity: No   Other Topics Concern  .  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  Always use your most recent med list.               acetaminophen 325 MG tablet  Commonly known as:  TYLENOL  Take 2 tablets (650 mg total) by mouth every 4 (four) hours as needed.     Adjustable Lancing Device Misc  Reported on 11/28/2015     Alcohol Prep 70 % Pads  Reported on 11/28/2015     aspirin 81 MG EC tablet  Take 2 tablets (162 mg total) by mouth daily.     blood glucose meter kit and supplies  Use as instructed     clopidogrel 75 MG tablet  Commonly known as:  PLAVIX  Take 1 tablet (75 mg total)  by mouth daily with breakfast.     doxazosin 4 MG tablet  Commonly known as:  CARDURA  Take 2 mg by mouth at bedtime.     finasteride 5 MG tablet  Commonly known as:  PROSCAR  Take 5 mg by mouth daily.     Fluticasone-Salmeterol 100-50 MCG/DOSE Aepb  Commonly known as:  ADVAIR  Inhale 1 puff into the lungs 2 (two) times daily as needed (shortness of breath).     furosemide 40 MG tablet  Commonly known as:  LASIX  Take 1 tablet (40 mg total) by mouth daily.     glucose blood test strip  Use to test blood sugars twice daily     Grape Seed 100 MG Caps  Take 2 capsules by mouth daily.     insulin glargine 100 UNIT/ML injection  Commonly known as:  LANTUS  Inject 7 Units into the skin at bedtime.     lisinopril 5 MG tablet  Commonly known as:  PRINIVIL,ZESTRIL  Take 1 tablet (5 mg total) by mouth daily.     metFORMIN 850 MG tablet  Commonly known as:  GLUCOPHAGE  Take 1.5 tablets (1,275 mg total) by mouth 2 (two) times daily with a meal.     metoprolol tartrate 25 MG tablet  Commonly known as:  LOPRESSOR  Take 0.5 tablets (12.5 mg total) by mouth 2 (two) times daily.     mometasone 50 MCG/ACT nasal spray  Commonly known as:  NASONEX  Place 2 sprays into the nose daily.     pantoprazole 40 MG tablet  Commonly known as:  PROTONIX  Take 1 tablet (40 mg total) by mouth daily.     rosuvastatin 10 MG tablet  Commonly known as:  CRESTOR  Take 1 tablet (10 mg total) by mouth daily.     SYSTANE BALANCE OP  Apply 2 drops to eye. 2 drops each eye daily     ZETIA 10 MG tablet  Generic drug:  ezetimibe  Take 10 mg by mouth daily.           Objective:   Physical Exam  Abdominal:     BP 126/74 mmHg  Pulse 74  Temp(Src) 97.6 F (36.4 C) (Oral)  Ht _0  (1.727 m)  Wt 183 lb (83.008 kg)  BMI 27.83 kg/m2  SpO2 97% General:  Well developed, well nourished . NAD.  HEENT:  Normocephalic . Face symmetric, atraumatic Lungs:  CTA B Normal respiratory effort, no  intercostal retractions, no accessory muscle use. Heart: RRR,  no murmur.  no pretibial edema bilaterally  Abdomen:  Not distended, soft, non-tender. No rebound or rigidity. No CVA tenderness Skin: Not pale. Not jaundice Neurologic:  alert & oriented X3.  Speech normal, gait appropriate for age and unassisted Psych--  Cognition and judgment appear intact.  Cooperative with normal attention span and concentration.  Behavior appropriate. No anxious or depressed appearing.    Assessment & Plan:   Assessment > Diabetes, no neuropathy as of 10-2015 10-2015: d/c Lantus, Actos. Start metformin Hyperlipidemia Hypertension CV: --CAD, CABG 7282 --CHF: Systolic, diastolic, EF echocardiogram 2014---45% --Peripheral vascular disease OSA: On CPAP Asthma Hearing loss  PLAN 11-28-15  Chest wall pain: Pain seems to be MSK in nature, will get x-ray to rule out a lytic lesion and other etiologies. Weight loss: 10 pounds since the last time he was here, he is actually feeling well, I wonder if the wt loss is related to stopping Actos or hyperglycemia. Will check an CBC, TSH, LFTs. UA. Reassess in 2 months DM: Recently changed from Lantus-Actos to metformin, CBGs in the 180s or 190s. Plan- restart Lantus. He has a leftover but will call within the name of the  long acting insulin his insurance will cover. RTC as scheduled April 2017.

## 2015-11-29 MED ORDER — INSULIN GLARGINE 100 UNIT/ML SOLOSTAR PEN: 7.0000 [IU] | PEN_INJECTOR | Freq: Every day | SUBCUTANEOUS | Status: DC

## 2015-11-29 MED ORDER — INSULIN PEN NEEDLE 32G X 4 MM MISC: Status: AC

## 2015-11-29 NOTE — Telephone Encounter (Signed)
Lantus d/c, Basaglar 7 units at night w/ pen needles [facility].

## 2015-12-12 ENCOUNTER — Encounter: Payer: Self-pay | Admitting: Internal Medicine

## 2015-12-16 ENCOUNTER — Other Ambulatory Visit: Payer: Self-pay

## 2015-12-16 MED ORDER — METFORMIN HCL 850 MG PO TABS: 1275.0000 mg | ORAL_TABLET | Freq: Two times a day (BID) | ORAL | Status: DC

## 2015-12-20 ENCOUNTER — Other Ambulatory Visit: Payer: Self-pay | Admitting: Physician Assistant

## 2015-12-21 NOTE — Telephone Encounter (Signed)
Rx sent to the pharmacy by e-script.//AB/CMA 

## 2015-12-26 ENCOUNTER — Encounter: Payer: Self-pay | Admitting: Internal Medicine

## 2015-12-30 IMAGING — DX DG CHEST 2V
2 series · 2 of 2 positions shown · non-contrast
Comparison: 11/21/2014 .

CLINICAL DATA: Shortness of breath.

EXAM:
CHEST  2 VIEW

[chest pa]
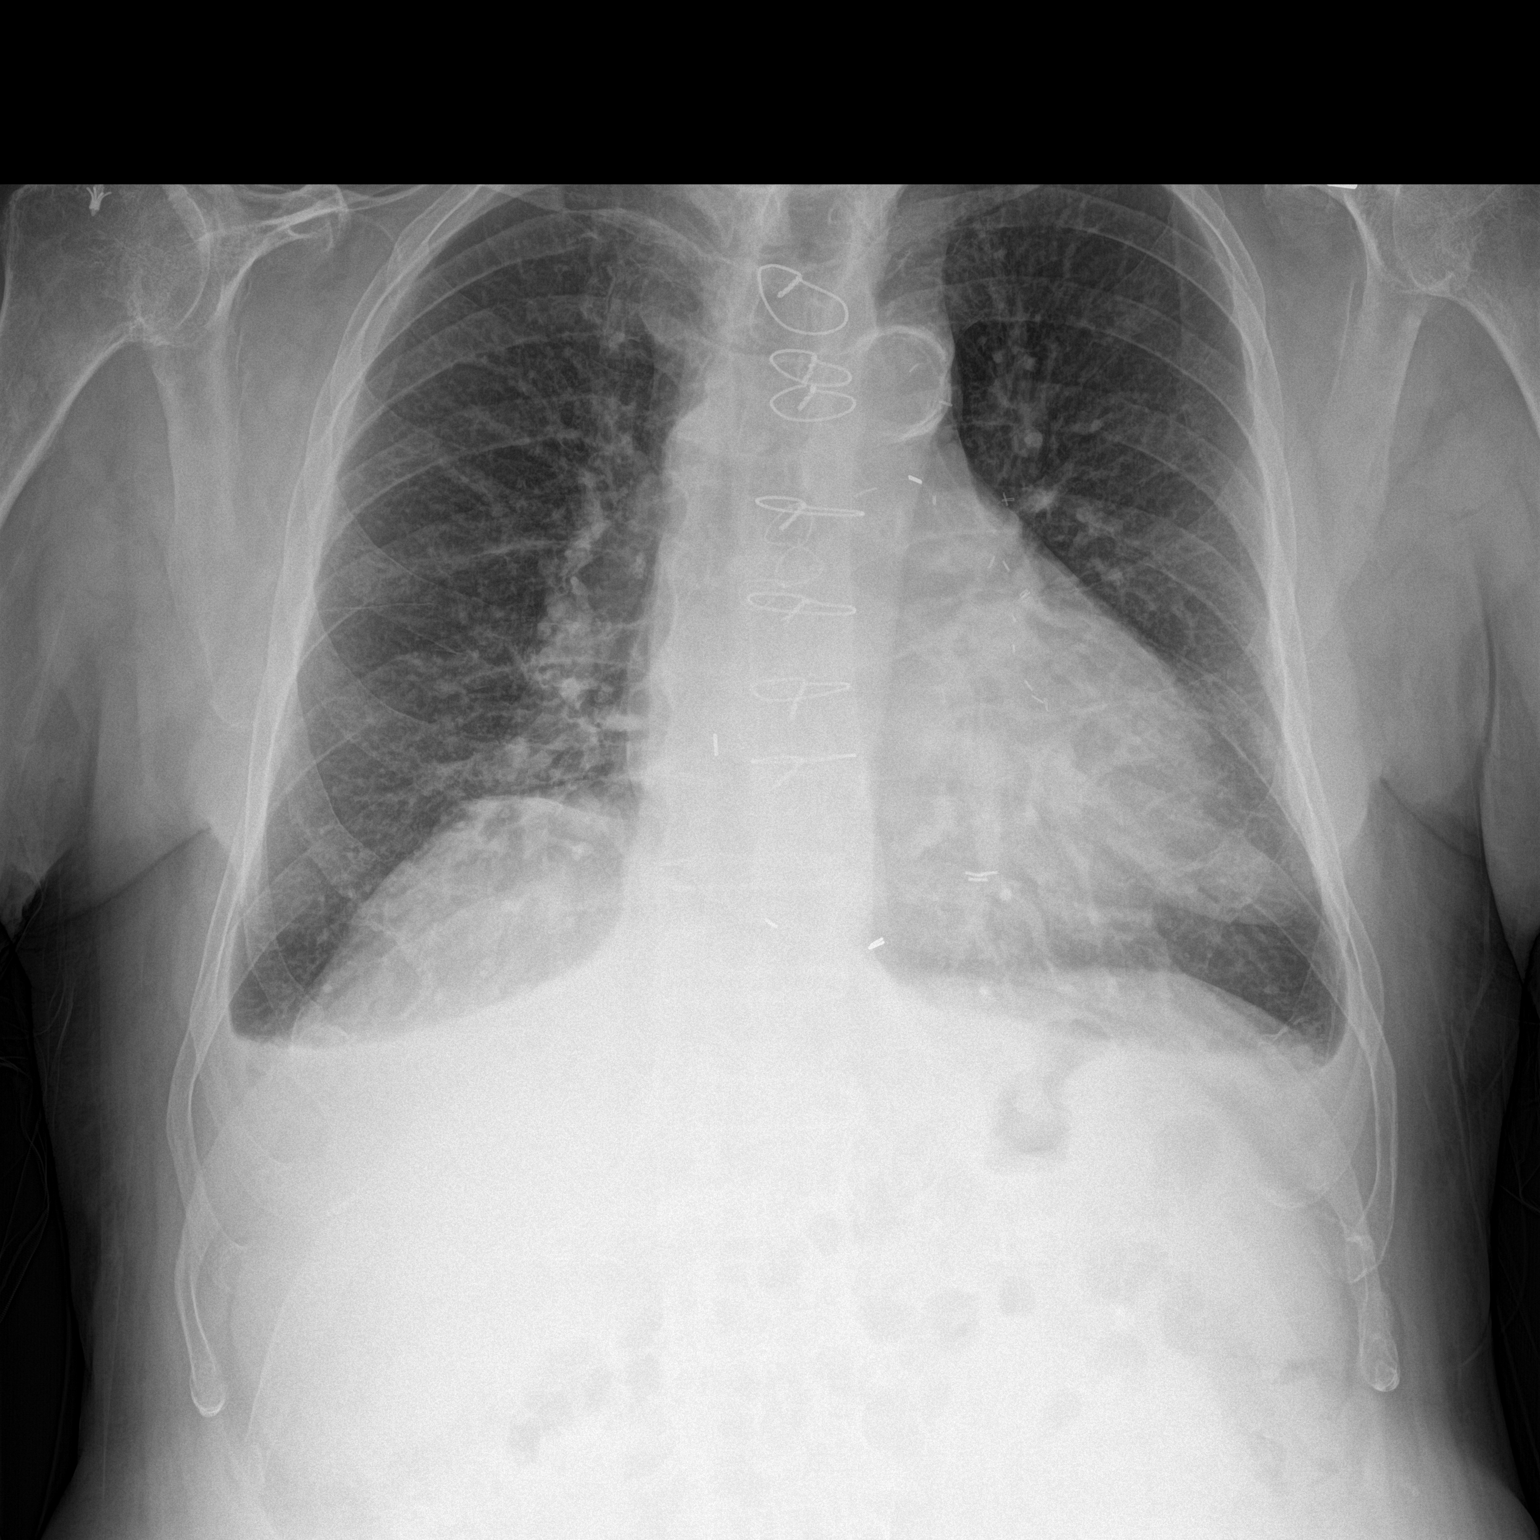

[chest lat]
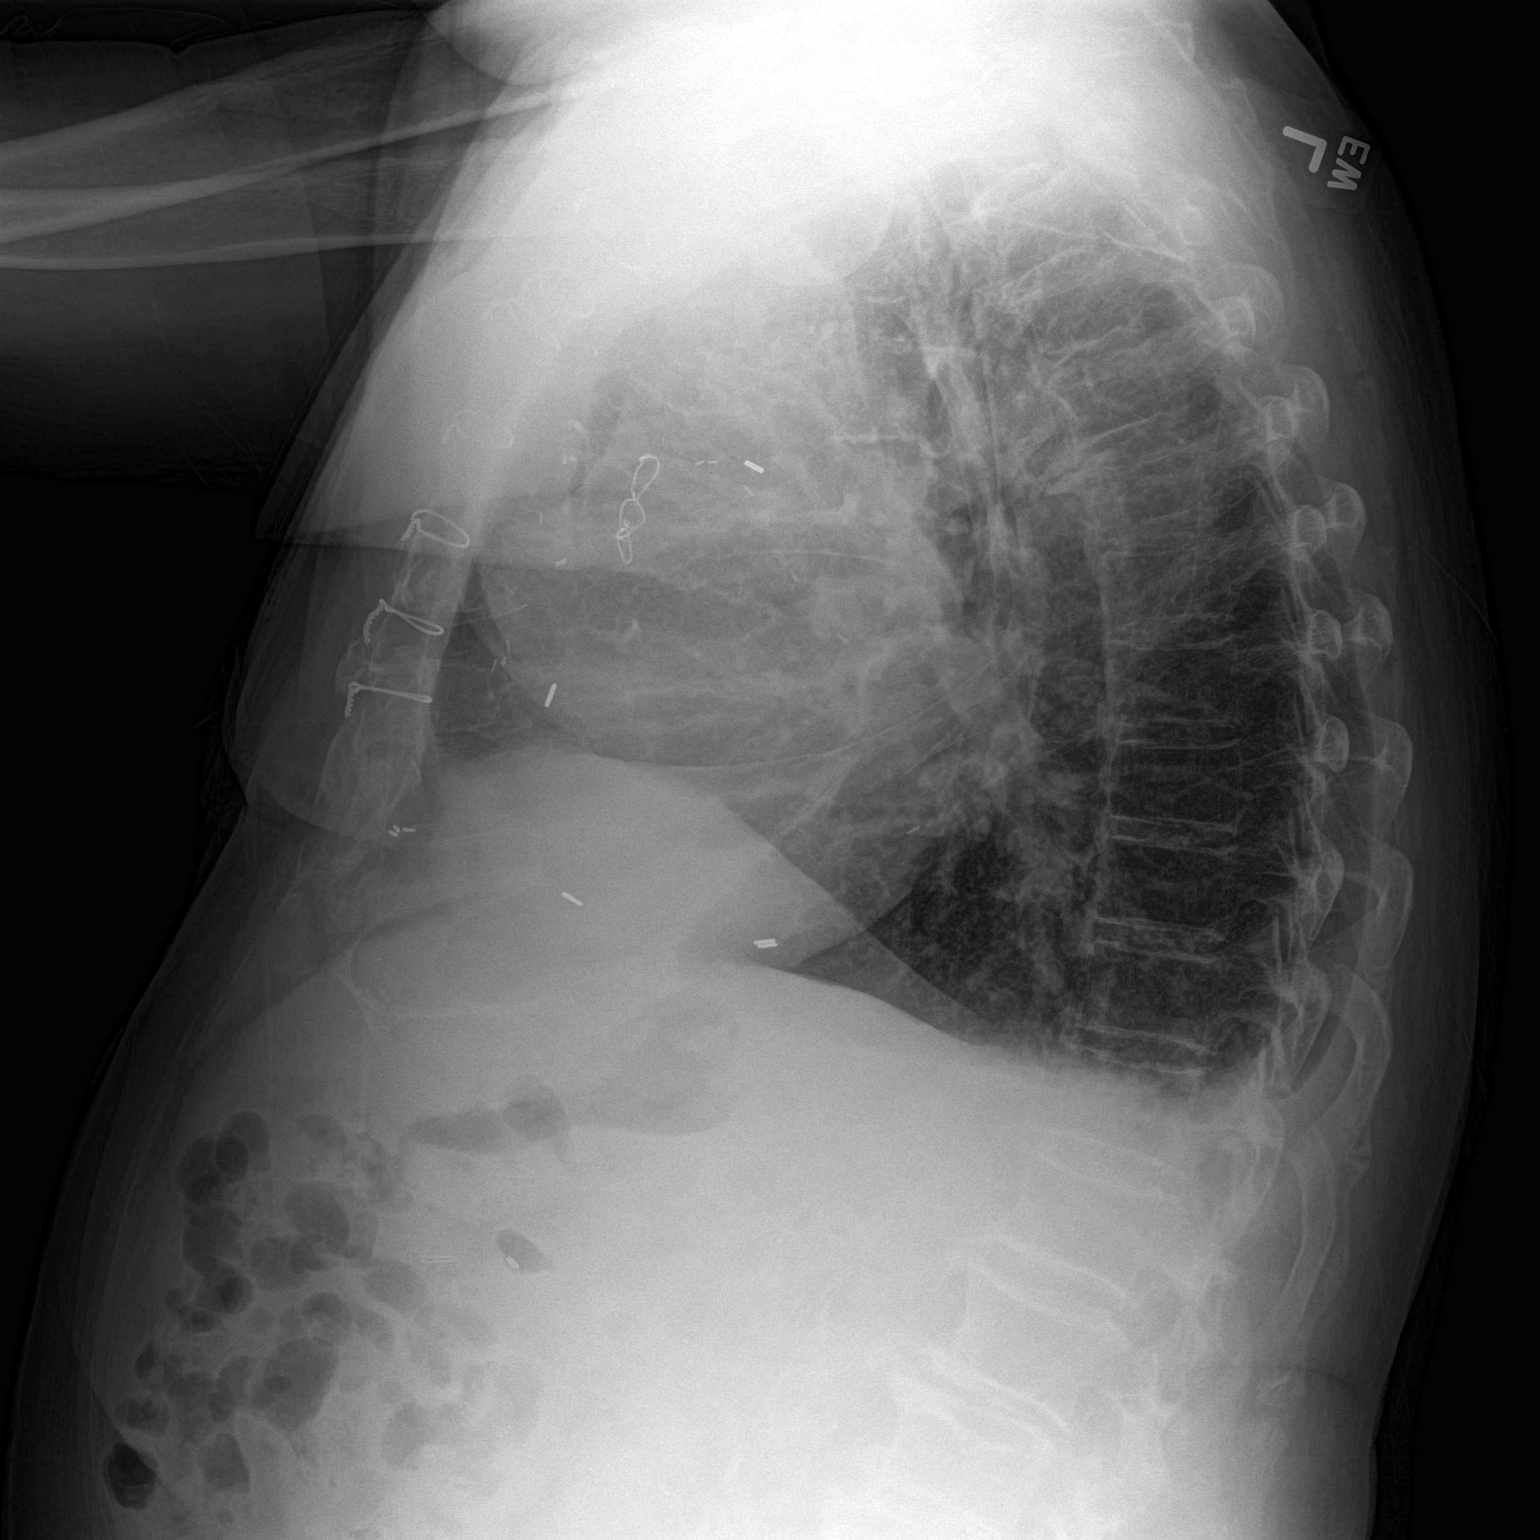

[2 of 2 positions shown; findings below may reference images not displayed]

FINDINGS: Mediastinum hilar structures are normal. Prior CABG. Cardiomegaly.
Mild pulmonary interstitial prominence with Kerley B-lines noted.
Small pleural effusions. Findings consistent mild congestive heart
failure. No acute bony abnormality. Postsurgical changes right
shoulder. Degenerative changes thoracic spine.
IMPRESSION: 1. Prior CABG.
2. Congestive heart failure with mild pulmonary interstitial edema
and bilateral effusions.

## 2016-01-04 LAB — HM DIABETES EYE EXAM

## 2016-01-26 ENCOUNTER — Encounter: Payer: Self-pay | Admitting: Internal Medicine

## 2016-01-30 ENCOUNTER — Ambulatory Visit (INDEPENDENT_AMBULATORY_CARE_PROVIDER_SITE_OTHER): Payer: Medicare Other | Admitting: Internal Medicine

## 2016-01-30 ENCOUNTER — Encounter: Payer: Self-pay | Admitting: Internal Medicine

## 2016-01-30 VITALS — BP 126/58 | HR 87 | Temp 98.1°F | Ht 68.0 in | Wt 178.2 lb

## 2016-01-30 DIAGNOSIS — E118 Type 2 diabetes mellitus with unspecified complications: Secondary | ICD-10-CM | POA: Diagnosis not present

## 2016-01-30 DIAGNOSIS — I10 Essential (primary) hypertension: Secondary | ICD-10-CM

## 2016-01-30 DIAGNOSIS — I255 Ischemic cardiomyopathy: Secondary | ICD-10-CM

## 2016-01-30 DIAGNOSIS — R519 Headache, unspecified: Secondary | ICD-10-CM

## 2016-01-30 DIAGNOSIS — R634 Abnormal weight loss: Secondary | ICD-10-CM | POA: Diagnosis not present

## 2016-01-30 DIAGNOSIS — R51 Headache: Secondary | ICD-10-CM | POA: Diagnosis not present

## 2016-01-30 LAB — SEDIMENTATION RATE: Sed Rate: 6 mm/hr (ref 0–22)

## 2016-01-30 LAB — BASIC METABOLIC PANEL
BUN: 19 mg/dL (ref 6–23)
CALCIUM: 9.4 mg/dL (ref 8.4–10.5)
CO2: 27 mEq/L (ref 19–32)
Chloride: 99 mEq/L (ref 96–112)
Creatinine, Ser: 0.74 mg/dL (ref 0.40–1.50)
GFR: 106.13 mL/min (ref >60.00)
GLUCOSE: 174 mg/dL — AB (ref 70–99)
POTASSIUM: 3.7 meq/L (ref 3.5–5.1)
SODIUM: 138 meq/L (ref 135–145)

## 2016-01-30 LAB — HEMOGLOBIN A1C: HEMOGLOBIN A1C: 7.4 % — AB (ref 4.6–6.5)

## 2016-01-30 NOTE — Patient Instructions (Signed)
GO TO THE LAB :      Get the blood work      GO TO THE FRONT DESK Schedule your next appointment for a  checkup in 3 months, no fasting   Diabetes: Check your blood sugar  once or twice a day at different times of the day  GOALS: Fasting before a meal 70- 130 2 hours after a meal less than 180 At bedtime 90-150 Call if consistently not at goal  Call and let me know your readings in 2 weeks

## 2016-01-30 NOTE — Progress Notes (Signed)
Pre visit review using our clinic review tool, if applicable. No additional management support is needed unless otherwise documented below in the visit note. 

## 2016-01-30 NOTE — Assessment & Plan Note (Signed)
Weight loss: Continue w/ weight loss, mild decreased appetite but no evidence of depression. Workup 11-2015: Negative chest x-ray, normal CBC and TSH. Will check a sedimentation rate. Could be related to stopping Actos a few months ago. DM: Currently on metformin, insulin 10 units, no recent CBGs. Check A1c and BMP HTN: Well-controlled Headaches: Mild, no neck stiffness, head injury. Check a sedimentation rate, reassess in 3 months but will call if symptoms increase. RTC 3 months

## 2016-01-30 NOTE — Progress Notes (Signed)
Subjective:    Patient ID: Ryan Plumb., male    DOB: [birth year], 82 y.o.   MRN: [id number]  DOS:  01/30/2016 Type of visit - description : From previous visit  Interval history: In general feeling well but has lost some additional weight. He is not sure why although admits to decreased  appetite. Denies any postprandial symptoms as the cause for decreased appetite. DM: Good compliance of medication, could not tell me his most recent CBGs. Also the wife reports he is having headaches, some days, symptoms are mild, Tylenol seems to help. Denies any neck pain or head injury.   Wt Readings from Last 3 Encounters:  01/30/16 178 lb 4 oz (80.854 kg)  11/28/15 183 lb (83.008 kg)  11/01/15 193 lb 8 oz (87.771 kg)     Review of Systems  No fever chills, no unusual myalgias. No chest pain, difficulty breathing or lower extremity edema No nausea, vomiting, blood in the stools. No diarrhea but he has 2 BMs daily, somewhat unusual to him. + Mild cough Denies any depression.   Past Medical History  Diagnosis Date  . CAD (coronary artery disease)     CABG '97, SVG DES 12/13  . Hyperlipidemia   . BPH (benign prostatic hyperplasia)   . Colonic polyp   . History of pancreatitis     pt denies this hx on 11/02/2013  . Hearing loss   . Acute CHF ([employer]) 09/15/2012  . Ischemic cardiomyopathy 09/16/2012    EF 25%- improved to 45% 3/14  . PVD (peripheral vascular disease) (HCC)     moderate bilat carotid disease  . Myocardial infarction ([place]) 1997  . Exertional shortness of breath   . Type II diabetes mellitus ([place])   . GERD (gastroesophageal reflux disease)   . Asthma     pt denies this hx on 11/02/2013  . Syncope and collapse 11/02/2013    "didn't pass out" (11/02/2013)  . Hypertension   . Intrinsic asthma 06/18/2007    Qualifier: Diagnosis of  By: Dance CMA ([place]), Kim    . Obstructed, uropathy     Past Surgical History  Procedure Laterality Date  . Coronary artery bypass graft  1997     "CABG X 5"  . Shoulder open rotator cuff repair Right   . Nasal septum surgery  2007  . Cholecystectomy  2000's  . Inguinal hernia repair Bilateral ~ 1950  . Coronary angioplasty with stent placement  Dec 2013    DES to SVG-CFX/PDA  . Cataract extraction w/ intraocular lens  implant, bilateral Bilateral ~ 1970  . Retinal detachment surgery Left ?1995  . Left heart catheterization with coronary/graft angiogram N/A 09/16/2012    Procedure: LEFT HEART CATHETERIZATION WITH Beatrix Fetters;  Surgeon: Lorretta Harp, MD;  Location: [facility] CATH LAB;  Service: Cardiovascular;  Laterality: N/A;    Social History   Social History  . Marital Status: Married    Spouse Name: N/A  . Number of Children: 1  . Years of Education: N/A   Occupational History  . 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 Smoking status: Former Smoker    Types: Cigars  . Smokeless tobacco: Never Used     Comment: 11/02/2013 "smoked cigars when I was 18 or so; didn't smoke many"  . Alcohol Use: 1.2 oz/week    2 Glasses of wine per week  . Drug Use: No  . Sexual Activity: No  Other Topics Concern  . 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  Always use your most recent med list.               acetaminophen 325 MG tablet  Commonly known as:  TYLENOL  Take 2 tablets (650 mg total) by mouth every 4 (four) hours as needed.     Adjustable Lancing Device Misc  Reported on 01/30/2016     Alcohol Prep 70 % Pads  Reported on 01/30/2016     aspirin 81 MG EC tablet  Take 2 tablets (162 mg total) by mouth daily.     BASAGLAR KWIKPEN 100 UNIT/ML Sopn  Inject 10 Units into the skin at bedtime.     blood glucose meter kit and supplies  Use  as instructed     clopidogrel 75 MG tablet  Commonly known as:  PLAVIX  Take 1 tablet (75 mg total) by mouth daily with breakfast.     doxazosin 4 MG tablet  Commonly known as:  CARDURA  Take 2 mg by mouth at bedtime.     finasteride 5 MG tablet  Commonly known as:  PROSCAR  Take 5 mg by mouth daily.     Fluticasone-Salmeterol 100-50 MCG/DOSE Aepb  Commonly known as:  ADVAIR  Inhale 1 puff into the lungs 2 (two) times daily as needed (shortness of breath).     furosemide 40 MG tablet  Commonly known as:  LASIX  Take 1 tablet (40 mg total) by mouth daily.     glucose blood test strip  Use to test blood sugars twice daily     Grape Seed 100 MG Caps  Take 2 capsules by mouth daily.     Insulin Pen Needle 32G X 4 MM Misc  To use w/ Basaglar     lisinopril 5 MG tablet  Commonly known as:  PRINIVIL,ZESTRIL  Take 1 tablet (5 mg total) by mouth daily.     metFORMIN 850 MG tablet  Commonly known as:  GLUCOPHAGE  Take 1.5 tablets (1,275 mg total) by mouth 2 (two) times daily with a meal.     metoprolol tartrate 25 MG tablet  Commonly known as:  LOPRESSOR  Take 0.5 tablets (12.5 mg total) by mouth 2 (two) times daily.     mometasone 50 MCG/ACT nasal spray  Commonly known as:  NASONEX  Place 2 sprays into the nose daily.     pantoprazole 40 MG tablet  Commonly known as:  PROTONIX  Take 1 tablet (40 mg total) by mouth daily.     PROAIR HFA 108 (90 Base) MCG/ACT inhaler  Generic drug:  albuterol  INHALE 2 PUFFS INTO THE LUNGS EVERY 6 (SIX) HOURS AS NEEDED FOR WHEEZING OR SHORTNESS OF BREATH.     rosuvastatin 10 MG tablet  Commonly known as:  CRESTOR  Take 1 tablet (10 mg total) by mouth daily.     SYSTANE BALANCE OP  Apply 2 drops to eye. 2 drops each eye daily     ZETIA 10 MG tablet  Generic drug:  ezetimibe  Take 10 mg by mouth daily.  Objective:   Physical Exam BP 126/58 mmHg  Pulse 87  Temp(Src) 98.1 F (36.7 C) (Oral)  Ht _0  (1.727 m)   Wt 178 lb 4 oz (80.854 kg)  BMI 27.11 kg/m2  SpO2 94% General:   Well developed, well nourished NAD, healthy-appearing elderly gentleman HEENT:  Normocephalic . Face symmetric, atraumatic Lungs:  Few rhonchi bilaterally, few end expiratory wheezing. Normal respiratory effort, no intercostal retractions, no accessory muscle use. Heart: RRR,  no murmur.  no pretibial edema bilaterally  Abdomen:  Not distended, soft, non-tender. No rebound or rigidity.  Skin: Not pale. Not jaundice Neurologic:  alert & oriented X3.  Speech normal, gait appropriate for age and unassisted Psych--  Cognition and judgment appear intact.  Cooperative with normal attention span and concentration.  Behavior appropriate. No anxious or depressed appearing.    Assessment & Plan:   Assessment > Diabetes, no neuropathy as of 10-2015 10-2015: d/c Lantus but had o go back on it 10-2015 , Actos (dt CHF) and start metformin Hyperlipidemia Hypertension CV: --CAD, CABG 5638 --CHF: Systolic, diastolic, EF echocardiogram 2014---45% --Peripheral vascular disease OSA: On CPAP Asthma Hearing loss  PLAN Weight loss: Continue w/ weight loss, mild decreased appetite but no evidence of depression. Workup 11-2015: Negative chest x-ray, normal CBC and TSH. Will check a sedimentation rate. Could be related to stopping Actos a few months ago. DM: Currently on metformin, insulin 10 units, no recent CBGs. Check A1c and BMP HTN: Well-controlled Headaches: Mild, no neck stiffness, head injury. Check a sedimentation rate, reassess in 3 months but will call if symptoms increase. RTC 3 months

## 2016-02-01 NOTE — Addendum Note (Signed)
Addended byConrad Excelsior D on: 02/01/2016 03:16 PM   Modules accepted: Medications

## 2016-02-12 ENCOUNTER — Other Ambulatory Visit: Payer: Self-pay | Admitting: Cardiology

## 2016-02-13 NOTE — Telephone Encounter (Signed)
Rx(s) sent to pharmacy electronically.  

## 2016-02-19 ENCOUNTER — Encounter: Payer: Self-pay | Admitting: Internal Medicine

## 2016-02-22 ENCOUNTER — Other Ambulatory Visit: Payer: Self-pay | Admitting: Cardiovascular Disease

## 2016-02-22 NOTE — Telephone Encounter (Signed)
Rx has been sent to the pharmacy electronically. ° °

## 2016-02-29 ENCOUNTER — Other Ambulatory Visit: Payer: Self-pay | Admitting: Cardiovascular Disease

## 2016-02-29 NOTE — Telephone Encounter (Signed)
Rx request sent to pharmacy.  

## 2016-03-14 ENCOUNTER — Other Ambulatory Visit: Payer: Self-pay | Admitting: Cardiovascular Disease

## 2016-03-15 NOTE — Telephone Encounter (Signed)
Rx(s) sent to pharmacy electronically.  

## 2016-03-19 ENCOUNTER — Other Ambulatory Visit: Payer: Self-pay | Admitting: Cardiovascular Disease

## 2016-03-19 MED ORDER — ZETIA 10 MG PO TABS: 10.0000 mg | ORAL_TABLET | Freq: Every day | ORAL | Status: DC

## 2016-03-19 NOTE — Telephone Encounter (Signed)
[facility] calling requesting a refill on Zetia 10 mg tablet. Please advise

## 2016-03-19 NOTE — Telephone Encounter (Signed)
rx sent

## 2016-03-22 ENCOUNTER — Telehealth: Payer: Self-pay | Admitting: Cardiovascular Disease

## 2016-03-22 ENCOUNTER — Encounter: Payer: Self-pay | Admitting: Internal Medicine

## 2016-03-22 MED ORDER — METFORMIN HCL 850 MG PO TABS: 1275.0000 mg | ORAL_TABLET | Freq: Two times a day (BID) | ORAL | Status: DC

## 2016-03-22 NOTE — Telephone Encounter (Signed)
New message     Pt c/o medication issue:  1. Name of Medication: Zetia  2. How are you currently taking this medication (dosage and times per day)? 10 mg po daily  3. Are you having a reaction (difficulty breathing--STAT)? No   4. What is your medication issue? The tach called to see if the Md could switch the medication to Generic brand it will save the patient a total os $258 on the cost please call back before 3 pm today

## 2016-03-22 NOTE — Telephone Encounter (Signed)
Metformin refilled to local pharmacy as requested.

## 2016-03-23 NOTE — Telephone Encounter (Signed)
Called pharmacy. OK to change to generic zetia to save patient money.

## 2016-03-28 ENCOUNTER — Telehealth: Payer: Self-pay | Admitting: *Deleted

## 2016-03-28 NOTE — Telephone Encounter (Signed)
Generic medication request form received for zetia instead of brand name. Dr Allyson Sabal signed approval for pt to have generic and document faxed to [phone].

## 2016-04-09 ENCOUNTER — Encounter: Payer: Self-pay | Admitting: Internal Medicine

## 2016-04-09 MED ORDER — FLUTICASONE-SALMETEROL 100-50 MCG/DOSE IN AEPB: 1.0000 | INHALATION_SPRAY | Freq: Two times a day (BID) | RESPIRATORY_TRACT | Status: DC | PRN

## 2016-04-09 NOTE — Telephone Encounter (Signed)
Advair sent to [facility].

## 2016-04-23 ENCOUNTER — Other Ambulatory Visit: Payer: Self-pay

## 2016-04-23 MED ORDER — BASAGLAR KWIKPEN 100 UNIT/ML ~~LOC~~ SOPN: 14.0000 [IU] | PEN_INJECTOR | Freq: Every day | SUBCUTANEOUS | Status: DC

## 2016-04-30 ENCOUNTER — Ambulatory Visit (INDEPENDENT_AMBULATORY_CARE_PROVIDER_SITE_OTHER): Payer: Medicare Other | Admitting: Internal Medicine

## 2016-04-30 ENCOUNTER — Encounter: Payer: Self-pay | Admitting: Internal Medicine

## 2016-04-30 VITALS — BP 122/58 | HR 74 | Temp 97.4°F | Ht 68.0 in | Wt 173.2 lb

## 2016-04-30 DIAGNOSIS — E1159 Type 2 diabetes mellitus with other circulatory complications: Secondary | ICD-10-CM | POA: Diagnosis not present

## 2016-04-30 DIAGNOSIS — Z794 Long term (current) use of insulin: Secondary | ICD-10-CM

## 2016-04-30 DIAGNOSIS — E785 Hyperlipidemia, unspecified: Secondary | ICD-10-CM

## 2016-04-30 DIAGNOSIS — R634 Abnormal weight loss: Secondary | ICD-10-CM

## 2016-04-30 DIAGNOSIS — I255 Ischemic cardiomyopathy: Secondary | ICD-10-CM | POA: Diagnosis not present

## 2016-04-30 LAB — LIPID PANEL
CHOL/HDL RATIO: 3
Cholesterol: 104 mg/dL (ref 0–200)
HDL: 38.2 mg/dL — AB (ref >39.00)
LDL CALC: 46 mg/dL (ref 0–99)
NonHDL: 66.21
TRIGLYCERIDES: 99 mg/dL (ref 0.0–149.0)
VLDL: 19.8 mg/dL (ref 0.0–40.0)

## 2016-04-30 LAB — HEMOGLOBIN A1C: Hgb A1c MFr Bld: 6.5 % (ref 4.6–6.5)

## 2016-04-30 NOTE — Progress Notes (Signed)
 Subjective:    Patient ID: Ryan K Fulwider Jr., male    DOB: 11/30/1925, 82 y.o.   MRN: [id number]  DOS:  04/30/2016 Type of visit - description : rov, Here with his wife Interval history: DM: Takes insulin qhs betwen 10 or 14 units "depending on the sugar", they did not bring CBG log. Started  Metformin ~ 6 months go; having loose bowel movements x 4-months, one or 2 times daily. Weight loss: He continue with some weight loss, denies depression. Denies nausea, vomiting, blood in the stools. States his appetite is simply not the same and he does not like to eat large meals. Denies any postprandial abdominal pain. HTN: Good med compliance, ambulatory BPs when checked they are normal.  Wt Readings from Last 3 Encounters:  04/30/16 173 lb 4 oz (78.586 kg)  01/30/16 178 lb 4 oz (80.854 kg)  11/28/15 183 lb (83.008 kg)     Review of Systems   Past Medical History  Diagnosis Date  . CAD (coronary artery disease)     CABG '97, SVG DES 12/13  . Hyperlipidemia   . BPH (benign prostatic hyperplasia)   . Colonic polyp   . History of pancreatitis     pt denies this hx on 11/02/2013  . Hearing loss   . Acute CHF (HCC) 09/15/2012  . Ischemic cardiomyopathy 09/16/2012    EF 25%- improved to 45% 3/14  . PVD (peripheral vascular disease) (HCC)     moderate bilat carotid disease  . Myocardial infarction (HCC) 1997  . Exertional shortness of breath   . Type II diabetes mellitus (HCC)   . GERD (gastroesophageal reflux disease)   . Asthma     pt denies this hx on 11/02/2013  . Syncope and collapse 11/02/2013    "didn't pass out" (11/02/2013)  . Hypertension   . Intrinsic asthma 06/18/2007    Qualifier: Diagnosis of  By: Dance CMA (AAMA), Kim    . Obstructed, uropathy     Past Surgical History  Procedure Laterality Date  . Coronary artery bypass graft  1997    "CABG X 5"  . Shoulder open rotator cuff repair Right   . Nasal septum surgery  2007  . Cholecystectomy  2000's  . Inguinal hernia  repair Bilateral ~ 1950  . Coronary angioplasty with stent placement  Dec 2013    DES to SVG-CFX/PDA  . Cataract extraction w/ intraocular lens  implant, bilateral Bilateral ~ 1970  . Retinal detachment surgery Left ?1995  . Left heart catheterization with coronary/graft angiogram N/A 09/16/2012    Procedure: LEFT HEART CATHETERIZATION WITH CORONARY/GRAFT ANGIOGRAM;  Surgeon: Jonathan J Berry, MD;  Location: [facility] CATH [facility];  Service: Cardiovascular;  Laterality: N/A;    Social History   Social History  . Marital Status: Married    Spouse Name: N/A  . Number of Children: 1  . Years of Education: N/A   Occupational History  . 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 Smoking status: Former Smoker    Types: Cigars  . Smokeless tobacco: Never Used     Comment: 11/02/2013 "smoked cigars when I was 18 or so; didn't smoke many"  . Alcohol Use: 1.2 oz/week    2 Glasses of wine per week  . Drug Use: No  . Sexual Activity: No   Other Topics Concern  . Not on file   Social History Narrative   Married '50-24 yrs, divorced; married '  82   1 son- '51   Retired but keeps up 4 rental houses, mows   End of Life: no CPR, no heroic or futile measures        Medication List       This list is accurate as of: 04/30/16 11:59 PM.  Always use your most recent med list.               acetaminophen 325 MG tablet  Commonly known as:  TYLENOL  Take 2 tablets (650 mg total) by mouth every 4 (four) hours as needed.     Adjustable Lancing Device Misc  Reported on 04/30/2016     Alcohol Prep 70 % Pads  Reported on 04/30/2016     aspirin 81 MG EC tablet  Take 2 tablets (162 mg total) by mouth daily.     BASAGLAR KWIKPEN 100 UNIT/ML Sopn  Inject 0.14 mLs (14 Units total) into the skin at bedtime.     blood glucose meter kit and supplies  Use as instructed     clopidogrel 75 MG tablet  Commonly known as:  PLAVIX  TAKE 1 TABLET DAILY WITH   BREAKFAST      doxazosin 4 MG tablet  Commonly known as:  CARDURA  Take 2 mg by mouth at bedtime.     finasteride 5 MG tablet  Commonly known as:  PROSCAR  Take 5 mg by mouth daily.     Fluticasone-Salmeterol 100-50 MCG/DOSE Aepb  Commonly known as:  ADVAIR  Inhale 1 puff into the lungs 2 (two) times daily as needed (shortness of breath).     furosemide 40 MG tablet  Commonly known as:  LASIX  TAKE 1 TABLET (40 MG TOTAL) BY MOUTH DAILY.     glucose blood test strip  Use to test blood sugars twice daily     Grape Seed 100 MG Caps  Take 2 capsules by mouth daily.     Insulin Pen Needle 32G X 4 MM Misc  To use w/ Basaglar     lisinopril 5 MG tablet  Commonly known as:  PRINIVIL,ZESTRIL  TAKE 1 TABLET DAILY     metFORMIN 850 MG tablet  Commonly known as:  GLUCOPHAGE  Take 850 mg by mouth daily. With lunch     metoprolol tartrate 25 MG tablet  Commonly known as:  LOPRESSOR  Take 0.5 tablets (12.5 mg total) by mouth 2 (two) times daily.     mometasone 50 MCG/ACT nasal spray  Commonly known as:  NASONEX  Place 2 sprays into the nose daily.     pantoprazole 40 MG tablet  Commonly known as:  PROTONIX  TAKE 1 TABLET DAILY     PROAIR HFA 108 (90 Base) MCG/ACT inhaler  Generic drug:  albuterol  INHALE 2 PUFFS INTO THE LUNGS EVERY 6 (SIX) HOURS AS NEEDED FOR WHEEZING OR SHORTNESS OF BREATH.     rosuvastatin 10 MG tablet  Commonly known as:  CRESTOR  Take 1 tablet (10 mg total) by mouth daily.     SYSTANE BALANCE OP  Apply 2 drops to eye. 2 drops each eye daily     ZETIA 10 MG tablet  Generic drug:  ezetimibe  Take 1 tablet (10 mg total) by mouth daily.           Objective:   Physical Exam BP 122/58 mmHg  Pulse 74  Temp(Src) 97.4 F (36.3 C) (Oral)  Ht 5' 8" (1.727 m)  Wt 173 lb 4 oz (  78.586 kg)  BMI 26.35 kg/m2  SpO2 94% General:   Well developed, well nourished . NAD.  HEENT:  Normocephalic . Face symmetric, atraumatic Lungs:  CTA B Normal respiratory effort, no  intercostal retractions, no accessory muscle use. Heart: RRR,  no murmur.  no pretibial edema bilaterally  Abdomen:  Not distended, soft, non-tender. No rebound or rigidity.   Skin: Not pale. Not jaundice Neurologic:  alert & oriented X3.  Speech normal, gait appropriate for age and unassisted Psych--  Cognition and judgment appear intact.  Cooperative with normal attention span and concentration.  Behavior appropriate. No anxious or depressed appearing.     Assessment & Plan:   Assessment > Diabetes, no neuropathy as of 10-2015 10-2015: d/c Lantus but had to go back on it 10-2015 , Actos (dt CHF) and start metformin Hyperlipidemia Hypertension CV: --CAD, CABG 9179 --CHF: Systolic, diastolic, EF echocardiogram 2014---45% --Peripheral vascular disease OSA: On CPAP Asthma Hearing loss  PLAN DM: Currently on insulin 10-14 units at night and metformin 850 mg 1.5 tablets bid.  Reports diarrhea for the last few months, ~ 8 weeks after he started metformin. I recommend to stop that medication but he is somewhat reluctant. We eventually agreed on the following: Decrease metformin to 850 mg daily, insulin 14 units every night, call w/ am cbg  readings in 2 weeks. If diarrhea does not improve consider d/c metformin and/or further eval (GI ref) High cholesterol - labs  Weight loss: Continue with mild weight loss, he is really feeling well, does have diarrhea, see above. No GI angina type of sx . Observation for now RTC 3 months

## 2016-04-30 NOTE — Progress Notes (Signed)
Pre visit review using our clinic review tool, if applicable. No additional management support is needed unless otherwise documented below in the visit note. 

## 2016-04-30 NOTE — Patient Instructions (Signed)
GO TO THE LAB : Get the blood work     GO TO THE FRONT DESK Schedule your next appointment for a routine checkup in 3 months    Take metformin 850 mg only one tablet a day with your lunch  Use your insulin 14 units every night  Check your blood sugar every morning before breakfast, call with readings in 2 weeks.

## 2016-05-01 NOTE — Assessment & Plan Note (Addendum)
DM: Currently on insulin 10-14 units at night and metformin 850 mg 1.5 tablets bid.  Reports diarrhea for the last few months, ~ 8 weeks after he started metformin. I recommend to stop that medication but he is somewhat reluctant. We eventually agreed on the following: Decrease metformin to 850 mg daily, insulin 14 units every night, call w/ am cbg  readings in 2 weeks. If diarrhea does not improve consider d/c metformin and/or further eval (GI ref) High cholesterol - labs  Weight loss: Continue with mild weight loss, he is really feeling well, does have diarrhea, see above. No GI angina type of sx . Observation for now RTC 3 months

## 2016-05-04 ENCOUNTER — Ambulatory Visit (INDEPENDENT_AMBULATORY_CARE_PROVIDER_SITE_OTHER): Payer: Medicare Other | Admitting: Cardiovascular Disease

## 2016-05-04 ENCOUNTER — Encounter: Payer: Self-pay | Admitting: Cardiovascular Disease

## 2016-05-04 VITALS — BP 92/56 | HR 74 | Ht 68.0 in | Wt 175.8 lb

## 2016-05-04 DIAGNOSIS — Z951 Presence of aortocoronary bypass graft: Secondary | ICD-10-CM

## 2016-05-04 DIAGNOSIS — E785 Hyperlipidemia, unspecified: Secondary | ICD-10-CM

## 2016-05-04 DIAGNOSIS — R079 Chest pain, unspecified: Secondary | ICD-10-CM

## 2016-05-04 DIAGNOSIS — I255 Ischemic cardiomyopathy: Secondary | ICD-10-CM

## 2016-05-04 MED ORDER — LISINOPRIL 2.5 MG PO TABS: 2.5000 mg | ORAL_TABLET | Freq: Every day | ORAL | Status: DC

## 2016-05-04 MED ORDER — ISOSORBIDE MONONITRATE ER 30 MG PO TB24: 15.0000 mg | ORAL_TABLET | Freq: Every day | ORAL | Status: DC

## 2016-05-04 NOTE — Patient Instructions (Signed)
Medication Instructions:  Your physician has recommended you make the following change in your medication:  1- DECREASE lisinopril to 2.5 mg by mouth daily. May take 1/2 tablet of your 5 mg tablets daily until you pick up your new prescription.  2- START Imdur 15 mg (1/2 tablet) by mouth daily.   Labwork: N/A  Testing/Procedures: Your physician has requested that you have a lexiscan myoview. For further information please visit [email or web address] Please follow instruction sheet, as given.    Follow-Up: Your physician recommends that you schedule a follow-up appointment in: 2-4 WEEKS WITH DR Allyson Sabal TO DISCUSS RESULTS AND REEVALUATION.    Any Other Special Instructions Will Be Listed Below (If Applicable).     If you need a refill on your cardiac medications before your next appointment, please call your pharmacy.

## 2016-05-04 NOTE — Progress Notes (Signed)
05/04/2016 [name].   1926/07/06  [id number]  Primary Physician Kathlene November, MD Primary Cardiologist: Lorretta Harp MD Lupe Carney, [place]  HPI:  . [name] is an 82 year old married Caucasian male with a history of CAD status post coronary artery bypass grafting in 1997 with a LIMA to his LAD, a vein to a first diagonal branch, OM and PDA. I last saw him in the office 08/17/15. His other problems include hypertension, hyperlipidemia and noninsulin-requiring diabetes. He has known peripheral vascular with moderate bilateral internal carotid artery stenosis which we are following by duplex ultrasound. He had a Myoview performed September 20, 2011, which showed scar in the LAD territory unchanged from prior studies with an EF of 35% to 45% by 2D echo. He presented to [facility] in early December with shortness of breath and congestive heart failure and was transferred to [facility] for further evaluation. He did have slightly elevated troponins and catheterization revealed stenosis in his SVG to his PDA which was stented with a DES stent. His EF at that time was 20% to 25% and a LifeVest was placed. He has done well since that time and is relatively asymptomatic. Limited echo today revealed an EF in the 45% range. Since I saw him back in 3 months ago he has seen Kerin Ransom has increased his Lasix. He continues to have dyspnea on exertion with class III symptoms and admits to dietary indiscretion with regards to salt. Since I saw him last he's had half a dozen episodes of nitroglycerin responsive chest pain.   Current Outpatient Prescriptions  Medication Sig Dispense Refill  . acetaminophen (TYLENOL) 325 MG tablet Take 2 tablets (650 mg total) by mouth every 4 (four) hours as needed.    . Alcohol Swabs (ALCOHOL PREP) 70 % PADS Reported on 04/30/2016    . aspirin EC 81 MG EC tablet Take 2 tablets (162 mg total) by mouth daily.    . Blood Glucose Monitoring Suppl (BLOOD GLUCOSE METER) kit  Use as instructed 1 each 0  . clopidogrel (PLAVIX) 75 MG tablet TAKE 1 TABLET DAILY WITH   BREAKFAST 90 tablet 2  . doxazosin (CARDURA) 4 MG tablet Take 2 mg by mouth at bedtime.     . finasteride (PROSCAR) 5 MG tablet Take 5 mg by mouth daily.    . Fluticasone-Salmeterol (ADVAIR) 100-50 MCG/DOSE AEPB Inhale 1 puff into the lungs 2 (two) times daily as needed (shortness of breath). 180 each 3  . furosemide (LASIX) 40 MG tablet TAKE 1 TABLET (40 MG TOTAL) BY MOUTH DAILY. 30 tablet 2  . glucose blood test strip Use to test blood sugars twice daily 100 each 5  . Grape Seed 100 MG CAPS Take 2 capsules by mouth daily.    . Insulin Glargine (BASAGLAR KWIKPEN) 100 UNIT/ML SOPN Inject 0.14 mLs (14 Units total) into the skin at bedtime. 45 mL 1  . Insulin Pen Needle 32G X 4 MM MISC To use w/ Basaglar 100 each 12  . Lancet Devices (ADJUSTABLE LANCING DEVICE) MISC Reported on 04/30/2016    . metFORMIN (GLUCOPHAGE) 850 MG tablet Take 850 mg by mouth daily. With lunch    . metoprolol tartrate (LOPRESSOR) 25 MG tablet Take 0.5 tablets (12.5 mg total) by mouth 2 (two) times daily. 90 tablet 3  . mometasone (NASONEX) 50 MCG/ACT nasal spray Place 2 sprays into the nose daily. 17 g 6  . pantoprazole (PROTONIX) 40 MG tablet TAKE 1 TABLET  DAILY 90 tablet 3  . PROAIR HFA 108 (90 Base) MCG/ACT inhaler INHALE 2 PUFFS INTO THE LUNGS EVERY 6 (SIX) HOURS AS NEEDED FOR WHEEZING OR SHORTNESS OF BREATH. 8.5 Inhaler 0  . Propylene Glycol (SYSTANE BALANCE OP) Apply 2 drops to eye. 2 drops each eye daily    . rosuvastatin (CRESTOR) 10 MG tablet Take 1 tablet (10 mg total) by mouth daily. 90 tablet 3  . ZETIA 10 MG tablet Take 1 tablet (10 mg total) by mouth daily. 90 tablet 1  . [DISCONTINUED] lisinopril (PRINIVIL,ZESTRIL) 5 MG tablet TAKE 1 TABLET DAILY 90 tablet 1  . [DISCONTINUED] nitroGLYCERIN (NITROSTAT) 0.4 MG SL tablet Place 1 tablet (0.4 mg total) under the tongue every 5 (five) minutes x 3 doses as needed for chest  pain. (Patient not taking: Reported on 03/29/2015) 25 tablet 2   No current facility-administered medications for this visit.    Allergies  Allergen Reactions  . Colesevelam     unknown  . Ezetimibe-Simvastatin     unknown    Social History   Social History  . Marital Status: Married    Spouse Name: N/A  . Number of Children: 1  . Years of Education: N/A   Occupational History  . 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 Smoking status: Former Smoker    Types: Cigars  . Smokeless tobacco: Never Used     Comment: 11/02/2013 "smoked cigars when I was 18 or so; didn't smoke many"  . Alcohol Use: 1.2 oz/week    2 Glasses of wine per week  . Drug Use: No  . Sexual Activity: No   Other Topics Concern  . Not on file   Social History Narrative   Married '50-24 yrs, divorced; married '82   1 son- '51   Retired but keeps up 4 rental houses, mows   End of Life: no CPR, no heroic or futile measures     Review of Systems: General: negative for chills, fever, night sweats or weight changes.  Cardiovascular: negative for chest pain, dyspnea on exertion, edema, orthopnea, palpitations, paroxysmal nocturnal dyspnea or shortness of breath Dermatological: negative for rash Respiratory: negative for cough or wheezing Urologic: negative for hematuria Abdominal: negative for nausea, vomiting, diarrhea, bright red blood per rectum, melena, or hematemesis Neurologic: negative for visual changes, syncope, or dizziness All other systems reviewed and are otherwise negative except as noted above.    Blood pressure 92/56, pulse 74, height '5\' 8"'  (1.727 m), weight 175 lb 12.8 oz (79.742 kg), SpO2 98 %.  General appearance: alert and no distress Neck: no adenopathy, no carotid bruit, no JVD, supple, symmetrical, trachea midline and thyroid not enlarged, symmetric, no tenderness/mass/nodules Lungs: clear to auscultation bilaterally Heart: regular rate and  rhythm, S1, S2 normal, no murmur, click, rub or gallop Extremities: extremities normal, atraumatic, no cyanosis or edema  EKG normal sinus rhythm at 77 with septal Q waves and inferolateral T-wave inversion. These are new since his prior EKG a year ago. I personally reviewed this EKG  ASSESSMENT AND PLAN:   Hyperlipidemia History of hyperlipidemia on statin therapy and Zetia with recent lipid profile performed 04/30/16 revealed total cholesterol 104, LDL 46 and HDL of 38.  Hx of CABG History of coronary artery bypass grafting X 5 in 1997 with a LIMA to his LAD, vein graft to first diagonal branch, obtuse marginal branch and PDA. He has had catheterization in the setting of  non-STEMI with stenting of his PDA vein graft with a drug-eluting stent. At that time his EF was in the 20-25% range which ultimately rose to 45%. He's had 4-5 episodes of nitrate responsive chest pain over the last 6 months with a more significant episode recently. Shortness of breath actually has improved since I last saw him. I'm going to start him on low-dose Imdur 15 mg a day and obtain a pharmacologic Myoview stress test. I'll see him back in several weeks for follow-up.  PVD- moderate carotid disease History of carotid artery disease with left Doppler performed 05/05/15 revealing moderate right and mild left ICA stenosis.      Lorretta Harp MD FACP,FACC,FAHA, [facility] 05/04/2016 11:08 AM

## 2016-05-04 NOTE — Assessment & Plan Note (Signed)
History of carotid artery disease with left Doppler performed 05/05/15 revealing moderate right and mild left ICA stenosis.

## 2016-05-04 NOTE — Assessment & Plan Note (Signed)
History of coronary artery bypass grafting X 5 in 1997 with a LIMA to his LAD, vein graft to first diagonal branch, obtuse marginal branch and PDA. He has had catheterization in the setting of non-STEMI with stenting of his PDA vein graft with a drug-eluting stent. At that time his EF was in the 20-25% range which ultimately rose to 45%. He's had 4-5 episodes of nitrate responsive chest pain over the last 6 months with a more significant episode recently. Shortness of breath actually has improved since I last saw him. I'm going to start him on low-dose Imdur 15 mg a day and obtain a pharmacologic Myoview stress test. I'll see him back in several weeks for follow-up.

## 2016-05-04 NOTE — Assessment & Plan Note (Signed)
History of hyperlipidemia on statin therapy and Zetia with recent lipid profile performed 04/30/16 revealed total cholesterol 104, LDL 46 and HDL of 38.

## 2016-05-08 NOTE — Addendum Note (Signed)
Addended by: Freddi Starr on: 05/08/2016 04:09 PM   Modules accepted: Orders

## 2016-05-11 ENCOUNTER — Telehealth (HOSPITAL_COMMUNITY): Payer: Self-pay

## 2016-05-11 NOTE — Telephone Encounter (Signed)
Encounter complete. 

## 2016-05-16 ENCOUNTER — Ambulatory Visit (HOSPITAL_COMMUNITY)
Admission: RE | Admit: 2016-05-16 | Discharge: 2016-05-16 | Disposition: A | Discharge status: Home / Self Care | Payer: Medicare Other | Source: Ambulatory Visit | Attending: Cardiovascular Disease | Admitting: Cardiovascular Disease

## 2016-05-16 DIAGNOSIS — E785 Hyperlipidemia, unspecified: Secondary | ICD-10-CM | POA: Insufficient documentation

## 2016-05-16 DIAGNOSIS — R0609 Other forms of dyspnea: Secondary | ICD-10-CM | POA: Diagnosis not present

## 2016-05-16 DIAGNOSIS — I779 Disorder of arteries and arterioles, unspecified: Secondary | ICD-10-CM | POA: Diagnosis not present

## 2016-05-16 DIAGNOSIS — R9439 Abnormal result of other cardiovascular function study: Secondary | ICD-10-CM | POA: Diagnosis not present

## 2016-05-16 DIAGNOSIS — Z951 Presence of aortocoronary bypass graft: Secondary | ICD-10-CM | POA: Insufficient documentation

## 2016-05-16 DIAGNOSIS — G4733 Obstructive sleep apnea (adult) (pediatric): Secondary | ICD-10-CM | POA: Insufficient documentation

## 2016-05-16 DIAGNOSIS — E119 Type 2 diabetes mellitus without complications: Secondary | ICD-10-CM | POA: Insufficient documentation

## 2016-05-16 DIAGNOSIS — R079 Chest pain, unspecified: Secondary | ICD-10-CM

## 2016-05-16 DIAGNOSIS — I517 Cardiomegaly: Secondary | ICD-10-CM | POA: Diagnosis not present

## 2016-05-16 DIAGNOSIS — R42 Dizziness and giddiness: Secondary | ICD-10-CM | POA: Insufficient documentation

## 2016-05-16 LAB — MYOCARDIAL PERFUSION IMAGING
CHL CUP NUCLEAR SDS: 2
CHL CUP RESTING HR STRESS: 67 {beats}/min
CSEPPHR: 85 {beats}/min
LV dias vol: 150 mL (ref 62–150)
LVSYSVOL: 88 mL
SRS: 13
SSS: 15
TID: 1.05

## 2016-05-16 MED ORDER — TECHNETIUM TC 99M TETROFOSMIN IV KIT
29.4000 | PACK | Freq: Once | INTRAVENOUS | Status: AC | PRN
  Administered 2016-05-16: 29.4 via INTRAVENOUS
  Filled 2016-05-16: qty 29

## 2016-05-16 MED ORDER — AMINOPHYLLINE 25 MG/ML IV SOLN
75.0000 mg | Freq: Once | INTRAVENOUS | Status: AC
  Administered 2016-05-16: 75 mg via INTRAVENOUS

## 2016-05-16 MED ORDER — REGADENOSON 0.4 MG/5ML IV SOLN
0.4000 mg | Freq: Once | INTRAVENOUS | Status: AC
  Administered 2016-05-16: 0.4 mg via INTRAVENOUS

## 2016-05-16 MED ORDER — TECHNETIUM TC 99M TETROFOSMIN IV KIT
9.9000 | PACK | Freq: Once | INTRAVENOUS | Status: AC | PRN
Start: 2016-05-16 — End: 2016-05-16
  Administered 2016-05-16: 9.9 via INTRAVENOUS
  Filled 2016-05-16: qty 10

## 2016-05-30 ENCOUNTER — Other Ambulatory Visit: Payer: Self-pay | Admitting: Cardiovascular Disease

## 2016-05-30 ENCOUNTER — Encounter: Payer: Self-pay | Admitting: Internal Medicine

## 2016-05-30 NOTE — Telephone Encounter (Signed)
Rx(s) sent to pharmacy electronically.  

## 2016-06-01 ENCOUNTER — Ambulatory Visit: Payer: PRIVATE HEALTH INSURANCE | Admitting: Cardiovascular Disease

## 2016-06-05 ENCOUNTER — Encounter: Payer: Self-pay | Admitting: Cardiovascular Disease

## 2016-06-05 ENCOUNTER — Ambulatory Visit (INDEPENDENT_AMBULATORY_CARE_PROVIDER_SITE_OTHER): Payer: Medicare Other | Admitting: Cardiovascular Disease

## 2016-06-05 VITALS — BP 130/44 | HR 66 | Ht 68.0 in | Wt 177.0 lb

## 2016-06-05 DIAGNOSIS — I739 Peripheral vascular disease, unspecified: Principal | ICD-10-CM

## 2016-06-05 DIAGNOSIS — Z951 Presence of aortocoronary bypass graft: Secondary | ICD-10-CM | POA: Diagnosis not present

## 2016-06-05 DIAGNOSIS — I255 Ischemic cardiomyopathy: Secondary | ICD-10-CM

## 2016-06-05 DIAGNOSIS — I779 Disorder of arteries and arterioles, unspecified: Secondary | ICD-10-CM

## 2016-06-05 NOTE — Progress Notes (Signed)
06/05/2016 [name].   [birth year]  [id number]  Primary Physician Kathlene November, MD Primary Cardiologist: Lorretta Harp MD Renae Gloss  HPI:  [name] is an 82 year old married Caucasian male with a history of CAD status post coronary artery bypass grafting in 1997 with a LIMA to his LAD, a vein to a first diagonal branch, OM and PDA. I last saw him in the office 05/04/16. His other problems include hypertension, hyperlipidemia and noninsulin-requiring diabetes. He has known peripheral vascular with moderate bilateral internal carotid artery stenosis which we are following by duplex ultrasound. He had a Myoview performed September 20, 2011, which showed scar in the LAD territory unchanged from prior studies with an EF of 35% to 45% by 2D echo. He presented to [facility] in early December with shortness of breath and congestive heart failure and was transferred to [facility] for further evaluation. He did have slightly elevated troponins and catheterization revealed stenosis in his SVG to his PDA which was stented with a DES stent. His EF at that time was 20% to 25% and a LifeVest was placed. He has done well since that time and is relatively asymptomatic. Limited echo today revealed an EF in the 45% range. Since I saw him back in 3 months ago he has seen Kerin Ransom has increased his Lasix. He continues to have dyspnea on exertion with class III symptoms and admits to dietary indiscretion with regards to salt. When I saw him a month ago he was relating episodes of nitroglycerin responsive chest pain. I did begin him on a acting nitrate and obtain a Myoview stress test on 05/16/16 that showed a large scar in the LAD territory not significantly changed from his Myoview performed 09/20/11. Since I saw him last visit chest pain has resolved.  Current Outpatient Prescriptions  Medication Sig Dispense Refill  . acetaminophen (TYLENOL) 325 MG tablet Take 2 tablets (650 mg total) by mouth every  4 (four) hours as needed.    . Alcohol Swabs (ALCOHOL PREP) 70 % PADS Reported on 04/30/2016    . aspirin EC 81 MG EC tablet Take 2 tablets (162 mg total) by mouth daily.    . Blood Glucose Monitoring Suppl (BLOOD GLUCOSE METER) kit Use as instructed 1 each 0  . clopidogrel (PLAVIX) 75 MG tablet TAKE 1 TABLET DAILY WITH   BREAKFAST 90 tablet 2  . doxazosin (CARDURA) 4 MG tablet Take 2 mg by mouth at bedtime.     . finasteride (PROSCAR) 5 MG tablet Take 5 mg by mouth daily.    . Fluticasone-Salmeterol (ADVAIR) 100-50 MCG/DOSE AEPB Inhale 1 puff into the lungs 2 (two) times daily as needed (shortness of breath). 180 each 3  . furosemide (LASIX) 40 MG tablet TAKE 1 TABLET (40 MG TOTAL) BY MOUTH DAILY. 30 tablet 10  . glucose blood test strip Use to test blood sugars twice daily 100 each 5  . Grape Seed 100 MG CAPS Take 2 capsules by mouth daily.    . Insulin Glargine (BASAGLAR KWIKPEN) 100 UNIT/ML SOPN Inject 0.14 mLs (14 Units total) into the skin at bedtime. 45 mL 1  . Insulin Pen Needle 32G X 4 MM MISC To use w/ Basaglar 100 each 12  . isosorbide mononitrate (IMDUR) 30 MG 24 hr tablet Take 0.5 tablets (15 mg total) by mouth daily. 90 tablet 3  . Lancet Devices (ADJUSTABLE LANCING DEVICE) MISC Reported on 04/30/2016    . lisinopril (ZESTRIL) 2.5  MG tablet Take 1 tablet (2.5 mg total) by mouth daily. 90 tablet 3  . metFORMIN (GLUCOPHAGE) 850 MG tablet Take 850 mg by mouth daily. With lunch    . metoprolol tartrate (LOPRESSOR) 25 MG tablet Take 0.5 tablets (12.5 mg total) by mouth 2 (two) times daily. 90 tablet 3  . mometasone (NASONEX) 50 MCG/ACT nasal spray Place 2 sprays into the nose daily. 17 g 6  . pantoprazole (PROTONIX) 40 MG tablet TAKE 1 TABLET DAILY 90 tablet 3  . PROAIR HFA 108 (90 Base) MCG/ACT inhaler INHALE 2 PUFFS INTO THE LUNGS EVERY 6 (SIX) HOURS AS NEEDED FOR WHEEZING OR SHORTNESS OF BREATH. 8.5 Inhaler 0  . Propylene Glycol (SYSTANE BALANCE OP) Apply 2 drops to eye. 2 drops each  eye daily    . rosuvastatin (CRESTOR) 10 MG tablet Take 1 tablet (10 mg total) by mouth daily. 90 tablet 3  . ZETIA 10 MG tablet Take 1 tablet (10 mg total) by mouth daily. 90 tablet 1   No current facility-administered medications for this visit.     Allergies  Allergen Reactions  . Colesevelam     unknown  . Ezetimibe-Simvastatin     unknown    Social History   Social History  . Marital status: Married    Spouse name: N/A  . Number of children: 1  . Years of education: N/A   Occupational History  . 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 Smoking status: Former Smoker    Types: Cigars  . Smokeless tobacco: Never Used     Comment: 11/02/2013 "smoked cigars when I was 18 or so; didn't smoke many"  . Alcohol use 1.2 oz/week    2 Glasses of wine per week  . Drug use: No  . Sexual activity: No   Other Topics Concern  . Not on file   Social History Narrative   Married '50-24 yrs, divorced; married '82   1 son- '51   Retired but keeps up 4 rental houses, mows   End of Life: no CPR, no heroic or futile measures     Review of Systems: General: negative for chills, fever, night sweats or weight changes.  Cardiovascular: negative for chest pain, dyspnea on exertion, edema, orthopnea, palpitations, paroxysmal nocturnal dyspnea or shortness of breath Dermatological: negative for rash Respiratory: negative for cough or wheezing Urologic: negative for hematuria Abdominal: negative for nausea, vomiting, diarrhea, bright red blood per rectum, melena, or hematemesis Neurologic: negative for visual changes, syncope, or dizziness All other systems reviewed and are otherwise negative except as noted above.    Blood pressure (!) 130/44, pulse 66, height _0  (1.727 m), weight 177 lb (80.3 kg).  General appearance: alert and no distress Neck: no adenopathy, no carotid bruit, no JVD, supple, symmetrical, trachea midline and thyroid not enlarged,  symmetric, no tenderness/mass/nodules Lungs: clear to auscultation bilaterally Heart: regular rate and rhythm, S1, S2 normal, no murmur, click, rub or gallop Extremities: extremities normal, atraumatic, no cyanosis or edema  EKG not performed today  ASSESSMENT AND PLAN:   Carotid artery disease History of carotid artery disease with moderate right ICA stenosis by duplex ultrasound July 2016. We will recheck carotid Doppler studies  Hx of CABG History of coronary artery bypass grafting X 5 in 1997. He had a nonischemic Myoview 09/20/11 with scar in the LAD territory. 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 He  had stenting to the circumflex vein graft to the PDA 09/26/12 after a non-STEMI. I saw him 05/04/16 with dyspnea and nitroglycerin responsive chest pain. I did begin Imdur. A Myoview stress test performed 05/16/16 again shows cardiology territory not significantly changed from his stress test performed in 2012. This chest pain symptoms have resolved since the addition of a long-acting nitrate.      Lorretta Harp MD FACP,FACC,FAHA, [facility] 06/05/2016 11:36 AM

## 2016-06-05 NOTE — Patient Instructions (Signed)
Medication Instructions:  Your physician recommends that you continue on your current medications as directed. Please refer to the Current Medication list given to you today.   Testing/Procedures: Your physician has requested that you have a carotid duplex. This test is an ultrasound of the carotid arteries in your neck. It looks at blood flow through these arteries that supply the brain with blood. Allow one hour for this exam. There are no restrictions or special instructions.    Follow-Up: We request that you follow-up in: 6 MONTHS WITH LUKE KILROY and in 12 MONTHS with Dr San Morelle will receive a reminder letter in the mail two months in advance. If you don't receive a letter, please call our office to schedule the follow-up appointment.  If you need a refill on your cardiac medications before your next appointment, please call your pharmacy.

## 2016-06-05 NOTE — Assessment & Plan Note (Signed)
History of coronary artery bypass grafting X 5 in 1997. He had a nonischemic Myoview 09/20/11 with scar in the LAD territory. 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 He had stenting to the circumflex vein graft to the PDA 09/26/12 after a non-STEMI. I saw him 05/04/16 with dyspnea and nitroglycerin responsive chest pain. I did begin Imdur. A Myoview stress test performed 05/16/16 again shows cardiology territory not significantly changed from his stress test performed in 2012. This chest pain symptoms have resolved since the addition of a long-acting nitrate.

## 2016-06-05 NOTE — Assessment & Plan Note (Signed)
History of carotid artery disease with moderate right ICA stenosis by duplex ultrasound July 2016. We will recheck carotid Doppler studies

## 2016-06-08 ENCOUNTER — Encounter: Payer: Self-pay | Admitting: Cardiology

## 2016-06-14 ENCOUNTER — Ambulatory Visit (HOSPITAL_COMMUNITY)
Admission: RE | Admit: 2016-06-14 | Discharge: 2016-06-14 | Disposition: A | Discharge status: Home / Self Care | Payer: Medicare Other | Source: Ambulatory Visit | Attending: Cardiology | Admitting: Cardiology

## 2016-06-14 DIAGNOSIS — I739 Peripheral vascular disease, unspecified: Secondary | ICD-10-CM | POA: Insufficient documentation

## 2016-06-14 DIAGNOSIS — I10 Essential (primary) hypertension: Secondary | ICD-10-CM | POA: Insufficient documentation

## 2016-06-14 DIAGNOSIS — Z87891 Personal history of nicotine dependence: Secondary | ICD-10-CM | POA: Insufficient documentation

## 2016-06-14 DIAGNOSIS — E785 Hyperlipidemia, unspecified: Secondary | ICD-10-CM | POA: Diagnosis not present

## 2016-06-14 DIAGNOSIS — I6523 Occlusion and stenosis of bilateral carotid arteries: Secondary | ICD-10-CM | POA: Diagnosis not present

## 2016-06-14 DIAGNOSIS — I2510 Atherosclerotic heart disease of native coronary artery without angina pectoris: Secondary | ICD-10-CM | POA: Insufficient documentation

## 2016-06-14 DIAGNOSIS — I779 Disorder of arteries and arterioles, unspecified: Secondary | ICD-10-CM | POA: Insufficient documentation

## 2016-06-14 DIAGNOSIS — E119 Type 2 diabetes mellitus without complications: Secondary | ICD-10-CM | POA: Insufficient documentation

## 2016-06-15 ENCOUNTER — Other Ambulatory Visit: Payer: Self-pay | Admitting: *Deleted

## 2016-06-15 DIAGNOSIS — I779 Disorder of arteries and arterioles, unspecified: Secondary | ICD-10-CM

## 2016-06-15 DIAGNOSIS — I739 Peripheral vascular disease, unspecified: Principal | ICD-10-CM

## 2016-06-30 ENCOUNTER — Other Ambulatory Visit: Payer: Self-pay | Admitting: Physician Assistant

## 2016-06-30 DIAGNOSIS — J209 Acute bronchitis, unspecified: Secondary | ICD-10-CM

## 2016-07-01 NOTE — Telephone Encounter (Signed)
Will defer further refills of patient's medications to PCP  

## 2016-07-30 ENCOUNTER — Ambulatory Visit (INDEPENDENT_AMBULATORY_CARE_PROVIDER_SITE_OTHER): Payer: Medicare Other | Admitting: Internal Medicine

## 2016-07-30 ENCOUNTER — Encounter: Payer: Self-pay | Admitting: Internal Medicine

## 2016-07-30 VITALS — BP 122/64 | HR 74 | Temp 98.4°F | Resp 14 | Ht 68.0 in | Wt 175.4 lb

## 2016-07-30 DIAGNOSIS — E118 Type 2 diabetes mellitus with unspecified complications: Secondary | ICD-10-CM

## 2016-07-30 DIAGNOSIS — I10 Essential (primary) hypertension: Secondary | ICD-10-CM

## 2016-07-30 DIAGNOSIS — E1159 Type 2 diabetes mellitus with other circulatory complications: Secondary | ICD-10-CM | POA: Diagnosis not present

## 2016-07-30 DIAGNOSIS — J45909 Unspecified asthma, uncomplicated: Secondary | ICD-10-CM

## 2016-07-30 DIAGNOSIS — Z794 Long term (current) use of insulin: Secondary | ICD-10-CM

## 2016-07-30 DIAGNOSIS — I255 Ischemic cardiomyopathy: Secondary | ICD-10-CM | POA: Diagnosis not present

## 2016-07-30 DIAGNOSIS — Z23 Encounter for immunization: Secondary | ICD-10-CM | POA: Diagnosis not present

## 2016-07-30 LAB — BASIC METABOLIC PANEL
BUN: 14 mg/dL (ref 6–23)
CHLORIDE: 97 meq/L (ref 96–112)
CO2: 30 meq/L (ref 19–32)
CREATININE: 0.82 mg/dL (ref 0.40–1.50)
Calcium: 9.3 mg/dL (ref 8.4–10.5)
GFR: 94.16 mL/min (ref >60.00)
GLUCOSE: 149 mg/dL — AB (ref 70–99)
Potassium: 3.9 mEq/L (ref 3.5–5.1)
Sodium: 136 mEq/L (ref 135–145)

## 2016-07-30 LAB — HEMOGLOBIN A1C: Hgb A1c MFr Bld: 7.1 % — ABNORMAL HIGH (ref 4.6–6.5)

## 2016-07-30 NOTE — Patient Instructions (Signed)
GO TO THE LAB : Get the blood work     GO TO THE FRONT DESK Schedule your next appointment for a  checkup in 4 months   Advair twice a day regardless of symptoms  Use albuterol as you're rescue inhaler, 2 puffs up to every 6 hours  as needed for cough and wheezing.  If you find yourself using albuterol more than 3 or 4 times a week, please call the office

## 2016-07-30 NOTE — Progress Notes (Signed)
Pre visit review using our clinic review tool, if applicable. No additional management support is needed unless otherwise documented below in the visit note. 

## 2016-07-30 NOTE — Assessment & Plan Note (Signed)
DM: Currently on basaglar 14 units and they just increased metformin to twice a day without causing diarrhea. With that increase, CBGs have decreased from the 200s to 160s. Check A1c HTN: Continue Cardura, Zestril, metoprolol, Lasix. Check a BMP, no ambulatory BPs but today blood pressure is very good CAD: Note from cardiology reviewed, he was felt to be stable Asthma: Occasional wheezing, takes Advair when necessary. No cough but some wheezing on exam. Recommend to take Advair twice a day and albuterol as a rescue inhaler. Flu shot today RTC 4 months.

## 2016-07-30 NOTE — Progress Notes (Signed)
Subjective:    Patient ID: Ryan Plumb., male    DOB: 1926/04/22, 82 y.o.   MRN: [id number]  DOS:  07/30/2016 Type of visit - description : Routine check up, here with his wife Interval history: DM: Lately his ambulatory blood sugars were  190, 200, they decided to increase metformin to twice a day and since then blood sugars are better in the 160 range. HTN: Good compliance of medication, no recent ambulatory BPs CAD: Note from cardiology reviewed Asthma: On Advair as needed   .Review of Systems Denies nausea, vomiting. No diarrhea even with increased dose of metformin No cough. No CP  Past Medical History:  Diagnosis Date  . Acute CHF ([place]) 09/15/2012  . Asthma    pt denies this hx on 11/02/2013  . BPH (benign prostatic hyperplasia)   . CAD (coronary artery disease)    CABG '97, SVG DES 12/13  . Colonic polyp   . Exertional shortness of breath   . GERD (gastroesophageal reflux disease)   . Hearing loss   . History of pancreatitis    pt denies this hx on 11/02/2013  . Hyperlipidemia   . Hypertension   . Intrinsic asthma 06/18/2007   Qualifier: Diagnosis of  By: Dance CMA ([place]), Kim    . Ischemic cardiomyopathy 09/16/2012   EF 25%- improved to 45% 3/14  . Myocardial infarction 1997  . Obstructed, uropathy   . PVD (peripheral vascular disease) ([facility])    moderate bilat carotid disease  . Syncope and collapse 11/02/2013   "didn't pass out" (11/02/2013)  . Type II diabetes mellitus ([place])     Past Surgical History:  Procedure Laterality Date  . CATARACT EXTRACTION W/ INTRAOCULAR LENS  IMPLANT, BILATERAL Bilateral ~ 1970  . CHOLECYSTECTOMY  2000's  . CORONARY ANGIOPLASTY WITH STENT PLACEMENT  Dec 2013   DES to SVG-CFX/PDA  . CORONARY ARTERY BYPASS GRAFT  1997   "CABG X 5"  . INGUINAL HERNIA REPAIR Bilateral ~ 1950  . LEFT HEART CATHETERIZATION WITH CORONARY/GRAFT ANGIOGRAM N/A 09/16/2012   Procedure: LEFT HEART CATHETERIZATION WITH Beatrix Fetters;  Surgeon:  Lorretta Harp, MD;  Location: [facility] CATH LAB;  Service: Cardiovascular;  Laterality: N/A;  . NASAL SEPTUM SURGERY  2007  . RETINAL DETACHMENT SURGERY Left ?1995  . SHOULDER OPEN ROTATOR CUFF REPAIR Right     Social History   Social History  . Marital status: Married    Spouse name: N/A  . Number of children: 1  . Years of education: N/A   Occupational History  . 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 Smoking status: Former Smoker    Types: Cigars  . Smokeless tobacco: Never Used     Comment: 11/02/2013 "smoked cigars when I was 18 or so; didn't smoke many"  . Alcohol use 1.2 oz/week    2 Glasses of wine per week  . Drug use: No  . Sexual activity: No   Other Topics Concern  . 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 Always use your most recent med list.  acetaminophen 325 MG tablet Commonly known as:  TYLENOL Take 2 tablets (650 mg total) by mouth every 4 (four) hours as needed.   Adjustable Lancing Device Misc Reported on 04/30/2016   albuterol 108 (90 Base) MCG/ACT inhaler Commonly known as:  PROAIR HFA Inhale 2 puffs into the lungs every 6 (six) hours as needed for wheezing or shortness of breath.   Alcohol Prep 70 % Pads Reported on 04/30/2016   aspirin 81 MG EC tablet Take 2 tablets (162 mg total) by mouth daily.   BASAGLAR KWIKPEN 100 UNIT/ML Sopn Inject 0.14 mLs (14 Units total) into the skin at bedtime.   blood glucose meter kit and supplies Use as instructed   clopidogrel 75 MG tablet Commonly known as:  PLAVIX TAKE 1 TABLET DAILY WITH   BREAKFAST   doxazosin 4 MG tablet Commonly known as:  CARDURA Take 2 mg by mouth at bedtime.   finasteride 5 MG tablet Commonly known as:   PROSCAR Take 5 mg by mouth daily.   Fluticasone-Salmeterol 100-50 MCG/DOSE Aepb Commonly known as:  ADVAIR Inhale 1 puff into the lungs 2 (two) times daily as needed (shortness of breath).   furosemide 40 MG tablet Commonly known as:  LASIX TAKE 1 TABLET (40 MG TOTAL) BY MOUTH DAILY.   glucose blood test strip Use to test blood sugars twice daily   Grape Seed 100 MG Caps Take 2 capsules by mouth daily.   Insulin Pen Needle 32G X 4 MM Misc To use w/ Basaglar   isosorbide mononitrate 30 MG 24 hr tablet Commonly known as:  IMDUR Take 0.5 tablets (15 mg total) by mouth daily.   lisinopril 2.5 MG tablet Commonly known as:  ZESTRIL Take 1 tablet (2.5 mg total) by mouth daily.   metFORMIN 850 MG tablet Commonly known as:  GLUCOPHAGE Take 850 mg by mouth daily. With lunch   metoprolol tartrate 25 MG tablet Commonly known as:  LOPRESSOR Take 0.5 tablets (12.5 mg total) by mouth 2 (two) times daily.   mometasone 50 MCG/ACT nasal spray Commonly known as:  NASONEX Place 2 sprays into the nose daily.   pantoprazole 40 MG tablet Commonly known as:  PROTONIX TAKE 1 TABLET DAILY   rosuvastatin 10 MG tablet Commonly known as:  CRESTOR Take 1 tablet (10 mg total) by mouth daily.   SYSTANE BALANCE OP Apply 2 drops to eye. 2 drops each eye daily   ZETIA 10 MG tablet Generic drug:  ezetimibe Take 1 tablet (10 mg total) by mouth daily.          Objective:   Physical Exam BP 122/64 (BP Location: Left Arm, Patient Position: Sitting, Cuff Size: Normal)   Pulse 74   Temp 98.4 F (36.9 C) (Oral)   Resp 14   Ht '5\' 8"'  (1.727 m)   Wt 175 lb 6 oz (79.5 kg)   SpO2 94%   BMI 26.67 kg/m  General:   Well developed, well nourished . NAD.  HEENT:  Normocephalic . Face symmetric, atraumatic Lungs:  CTA B except for few end expiratory wheezing Normal respiratory effort, no intercostal retractions, no accessory muscle use. Heart: RRR,  no murmur.  no pretibial edema bilaterally   Abdomen:  Not distended, soft, non-tender. No rebound or rigidity.   Skin: Not pale. Not jaundice Neurologic:  alert & oriented X3.  Speech normal, gait appropriate for age and unassisted Psych--  Cognition and judgment appear intact.  Cooperative with normal attention span and concentration.  Behavior  appropriate. No anxious or depressed appearing.     Assessment & Plan:   Assessment > Diabetes, no neuropathy as of 10-2015 10-2015: d/c Lantus but had to go back on it 10-2015 , dc Actos (dt CHF) and start metformin Hyperlipidemia Hypertension CV: --CAD, CABG 8948 --CHF: Systolic, diastolic, EF echocardiogram 2014---45% --Peripheral vascular disease OSA: On CPAP Asthma Hearing loss  PLAN DM: Currently on basaglar 14 units and they just increased metformin to twice a day without causing diarrhea. With that increase, CBGs have decreased from the 200s to 160s. Check A1c HTN: Continue Cardura, Zestril, metoprolol, Lasix. Check a BMP, no ambulatory BPs but today blood pressure is very good CAD: Note from cardiology reviewed, he was felt to be stable Asthma: Occasional wheezing, takes Advair when necessary. No cough but some wheezing on exam. Recommend to take Advair twice a day and albuterol as a rescue inhaler. Flu shot today RTC 4 months.

## 2016-08-13 ENCOUNTER — Other Ambulatory Visit: Payer: Self-pay

## 2016-08-13 MED ORDER — METOPROLOL TARTRATE 25 MG PO TABS: 12.5000 mg | ORAL_TABLET | Freq: Two times a day (BID) | ORAL | 3 refills | Status: DC

## 2016-08-13 NOTE — Telephone Encounter (Signed)
Rx(s) sent to pharmacy electronically.  

## 2016-08-15 ENCOUNTER — Other Ambulatory Visit: Payer: Self-pay

## 2016-08-15 MED ORDER — METOPROLOL TARTRATE 25 MG PO TABS: 12.5000 mg | ORAL_TABLET | Freq: Two times a day (BID) | ORAL | 3 refills | Status: DC

## 2016-08-16 ENCOUNTER — Encounter: Payer: Self-pay | Admitting: Internal Medicine

## 2016-08-16 NOTE — Telephone Encounter (Signed)
Old note, from August.  Patient spouse reports no problems with getting medication

## 2016-09-17 LAB — HM DIABETES EYE EXAM

## 2016-09-25 ENCOUNTER — Other Ambulatory Visit: Payer: Self-pay

## 2016-09-25 MED ORDER — ZETIA 10 MG PO TABS: 10.0000 mg | ORAL_TABLET | Freq: Every day | ORAL | 1 refills | Status: DC

## 2016-09-27 ENCOUNTER — Ambulatory Visit (INDEPENDENT_AMBULATORY_CARE_PROVIDER_SITE_OTHER): Payer: Medicare Other | Admitting: Medical

## 2016-09-27 ENCOUNTER — Encounter: Payer: Self-pay | Admitting: Medical

## 2016-09-27 VITALS — BP 125/46 | HR 81 | Temp 98.1°F | Ht 68.0 in | Wt 177.2 lb

## 2016-09-27 DIAGNOSIS — J209 Acute bronchitis, unspecified: Secondary | ICD-10-CM | POA: Diagnosis not present

## 2016-09-27 DIAGNOSIS — I255 Ischemic cardiomyopathy: Secondary | ICD-10-CM

## 2016-09-27 MED ORDER — AZITHROMYCIN 250 MG PO TABS: ORAL_TABLET | ORAL | 0 refills | Status: DC

## 2016-09-27 MED ORDER — BENZONATATE 100 MG PO CAPS
100.0000 mg | ORAL_CAPSULE | Freq: Three times a day (TID) | ORAL | 0 refills | Status: DC | PRN
Start: 2016-09-27 — End: 2016-11-26

## 2016-09-27 MED ORDER — BENZONATATE 100 MG PO CAPS: 100.0000 mg | ORAL_CAPSULE | Freq: Three times a day (TID) | ORAL | 0 refills | Status: DC | PRN

## 2016-09-27 NOTE — Progress Notes (Signed)
..  lb

## 2016-09-27 NOTE — Patient Instructions (Addendum)
You have 2 weeks of uri type symptoms and appear to be getting early bronchitis.  Will rx azithromycin antibiotic. Benzonatate for cough and continue your nasonex.  For your reported fatigue if this persists past above treatment then would recommend cbc. But our lab is closed presently. If worse cough or increasing chest congestion then will order cxr.  Continue your inhalers. If worse wheezing might need to consider taper prednisone but would use with caution since diabetic.   Follow up in 7 days or as needed

## 2016-09-27 NOTE — Progress Notes (Signed)
Subjective:    Patient ID: [name]., male    DOB: Sep 11, 1926, 82 y.o.   MRN: [id number]  HPI  Pt in states he some nasal congestion, pnd and cough for 2 weeks. No fever, no chills or sweats. 2 nights ago cough kept him up. Pt has been wheezing some intermittently. He use albuterol occasionally. Recently using twice a day for months. Also has advair.  Pt has been fatigued since the illness as well.     Review of Systems  Constitutional: Negative for chills, fatigue and fever.  HENT: Positive for congestion, sinus pain and sinus pressure. Negative for ear pain, hearing loss, sneezing and sore throat.   Respiratory: Positive for cough. Negative for chest tightness, shortness of breath and wheezing.        Pt did mention rare transient brief 1 second or so pain left lower rib region when he coughs.  Cardiovascular: Negative for chest pain and palpitations.  Gastrointestinal: Negative for abdominal pain, constipation and diarrhea.  Musculoskeletal: Negative for back pain.  Skin: Negative for rash.  Neurological: Negative for dizziness and headaches.  Hematological: Negative for adenopathy. Does not bruise/bleed easily.  Psychiatric/Behavioral: Negative for behavioral problems and confusion.    Past Medical History:  Diagnosis Date  . Acute CHF ([place]) 09/15/2012  . Asthma    pt denies this hx on 11/02/2013  . BPH (benign prostatic hyperplasia)   . CAD (coronary artery disease)    CABG '97, SVG DES 12/13  . Colonic polyp   . Exertional shortness of breath   . GERD (gastroesophageal reflux disease)   . Hearing loss   . History of pancreatitis    pt denies this hx on 11/02/2013  . Hyperlipidemia   . Hypertension   . Intrinsic asthma 06/18/2007   Qualifier: Diagnosis of  By: Dance CMA ([place]), Kim    . Ischemic cardiomyopathy 09/16/2012   EF 25%- improved to 45% 3/14  . Myocardial infarction 1997  . Obstructed, uropathy   . PVD (peripheral vascular disease) ([facility])    moderate  bilat carotid disease  . Syncope and collapse 11/02/2013   "didn't pass out" (11/02/2013)  . Type II diabetes mellitus ([place])      Social History   Social History  . Marital status: Married    Spouse name: N/A  . Number of children: 1  . Years of education: N/A   Occupational History  . 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 Smoking status: Former Smoker    Types: Cigars  . Smokeless tobacco: Never Used     Comment: 11/02/2013 "smoked cigars when I was 18 or so; didn't smoke many"  . Alcohol use 1.2 oz/week    2 Glasses of wine per week  . Drug use: No  . Sexual activity: No   Other Topics Concern  . Not on file   Social History Narrative   Married '50-24 yrs, divorced; married '82   1 son- '51   Retired but keeps up 4 rental houses, mows   End of Life: no CPR, no heroic or futile measures    Past Surgical History:  Procedure Laterality Date  . CATARACT EXTRACTION W/ INTRAOCULAR LENS  IMPLANT, BILATERAL Bilateral ~ 1970  . CHOLECYSTECTOMY  2000's  . CORONARY ANGIOPLASTY WITH STENT PLACEMENT  Dec 2013   DES to SVG-CFX/PDA  . CORONARY ARTERY BYPASS GRAFT  1997   "CABG X 5"  .  INGUINAL HERNIA REPAIR Bilateral ~ 1950  . LEFT HEART CATHETERIZATION WITH CORONARY/GRAFT ANGIOGRAM N/A 09/16/2012   Procedure: LEFT HEART CATHETERIZATION WITH Beatrix Fetters;  Surgeon: Lorretta Harp, MD;  Location: [facility] CATH LAB;  Service: Cardiovascular;  Laterality: N/A;  . NASAL SEPTUM SURGERY  2007  . RETINAL DETACHMENT SURGERY Left ?1995  . SHOULDER OPEN ROTATOR CUFF REPAIR Right     Family History  Problem Relation Age of Onset  . Prostate cancer Brother     dx ~ 68 y/o  . Colon cancer Neg Hx     Allergies  Allergen Reactions  . Colesevelam     unknown  . Ezetimibe-Simvastatin     unknown    Current Outpatient Prescriptions on File Prior to Visit  Medication Sig Dispense Refill  . acetaminophen (TYLENOL) 325 MG tablet Take 2 tablets  (650 mg total) by mouth every 4 (four) hours as needed.    Marland Kitchen albuterol (PROAIR HFA) 108 (90 Base) MCG/ACT inhaler Inhale 2 puffs into the lungs every 6 (six) hours as needed for wheezing or shortness of breath. 18 g 5  . Alcohol Swabs (ALCOHOL PREP) 70 % PADS Reported on 04/30/2016    . aspirin EC 81 MG EC tablet Take 2 tablets (162 mg total) by mouth daily.    . Blood Glucose Monitoring Suppl (BLOOD GLUCOSE METER) kit Use as instructed 1 each 0  . clopidogrel (PLAVIX) 75 MG tablet TAKE 1 TABLET DAILY WITH   BREAKFAST 90 tablet 2  . doxazosin (CARDURA) 4 MG tablet Take 2 mg by mouth at bedtime.     . finasteride (PROSCAR) 5 MG tablet Take 5 mg by mouth daily.    . Fluticasone-Salmeterol (ADVAIR) 100-50 MCG/DOSE AEPB Inhale 1 puff into the lungs 2 (two) times daily as needed (shortness of breath). 180 each 3  . furosemide (LASIX) 40 MG tablet TAKE 1 TABLET (40 MG TOTAL) BY MOUTH DAILY. 30 tablet 10  . glucose blood test strip Use to test blood sugars twice daily 100 each 5  . Grape Seed 100 MG CAPS Take 2 capsules by mouth daily.    . Insulin Glargine (BASAGLAR KWIKPEN) 100 UNIT/ML SOPN Inject 0.14 mLs (14 Units total) into the skin at bedtime. 45 mL 1  . Insulin Pen Needle 32G X 4 MM MISC To use w/ Basaglar 100 each 12  . isosorbide mononitrate (IMDUR) 30 MG 24 hr tablet Take 0.5 tablets (15 mg total) by mouth daily. 90 tablet 3  . Lancet Devices (ADJUSTABLE LANCING DEVICE) MISC Reported on 04/30/2016    . lisinopril (ZESTRIL) 2.5 MG tablet Take 1 tablet (2.5 mg total) by mouth daily. 90 tablet 3  . metFORMIN (GLUCOPHAGE) 850 MG tablet Take 850 mg by mouth 2 (two) times daily with a meal.    . metoprolol tartrate (LOPRESSOR) 25 MG tablet Take 0.5 tablets (12.5 mg total) by mouth 2 (two) times daily. 90 tablet 3  . mometasone (NASONEX) 50 MCG/ACT nasal spray Place 2 sprays into the nose daily. 17 g 6  . pantoprazole (PROTONIX) 40 MG tablet TAKE 1 TABLET DAILY 90 tablet 3  . Propylene Glycol  (SYSTANE BALANCE OP) Apply 2 drops to eye. 2 drops each eye daily    . rosuvastatin (CRESTOR) 10 MG tablet Take 1 tablet (10 mg total) by mouth daily. 90 tablet 3  . ZETIA 10 MG tablet Take 1 tablet (10 mg total) by mouth daily. 90 tablet 1  . [DISCONTINUED] nitroGLYCERIN (NITROSTAT) 0.4 MG SL tablet  Place 1 tablet (0.4 mg total) under the tongue every 5 (five) minutes x 3 doses as needed for chest pain. (Patient not taking: Reported on 03/29/2015) 25 tablet 2   No current facility-administered medications on file prior to visit.     BP (!) 125/46   Pulse 81   Temp 98.1 F (36.7 C) (Oral)   Ht 5' 8" (1.727 m)   Wt 177 lb 3.2 oz (80.4 kg)   SpO2 96%   BMI 26.94 kg/m       Objective:   Physical Exam  General  Mental Status - Alert. General Appearance - Well groomed. Not in acute distress.  Skin Rashes- No Rashes.  HEENT Head- Normal. Ear Auditory Canal - Left- Normal. Right - Normal.Tympanic Membrane- Left- Normal. Right- Normal. Eye Sclera/Conjunctiva- Left- Normal. Right- Normal. Nose & Sinuses Nasal Mucosa- Left-  Boggy and Congested. Right-  Boggy and  Congested.Bilateral no maxillary and no  frontal sinus pressure. Mouth & Throat Lips: Upper Lip- Normal: no dryness, cracking, pallor, cyanosis, or vesicular eruption. Lower Lip-Normal: no dryness, cracking, pallor, cyanosis or vesicular eruption. Buccal Mucosa- Bilateral- No Aphthous ulcers. Oropharynx- No Discharge or Erythema. Tonsils: Characteristics- Bilateral- No Erythema or Congestion. Size/Enlargement- Bilateral- No enlargement. Discharge- bilateral-None.  Neck Neck- Supple. No Masses.   Chest and Lung Exam Auscultation: Breath Sounds:-Clear even and unlabored.  Cardiovascular Auscultation:Rythm- Regular, rate and rhythm. Murmurs & Other Heart Sounds:Ausculatation of the heart reveal- No Murmurs.  Lymphatic Head & Neck General Head & Neck Lymphatics: Bilateral: Description- No Localized  lymphadenopathy.       Assessment & Plan:  You have 2 weeks of uri type symptoms and appear to be getting early bronchitis.  Will rx azithromycin antibiotic. Benzonatate for cough and continue your nasonex.  For your reported fatigue if this persists past above treatment then would recommend cbc. But our lab is closed presently. If worse cough or increasing chest congestion then will order cxr.  Continue your inhalers. If worse wheezing might need to consider taper prednisone but would use with caution since diabetic.  Follow up in 7 days or as needed  , Percell Miller, [employer]

## 2016-09-27 NOTE — Progress Notes (Signed)
Pre visit review using our clinic tool,if applicable. No additional management support is needed unless otherwise documented below in the visit note.  

## 2016-09-28 ENCOUNTER — Other Ambulatory Visit: Payer: Self-pay | Admitting: Cardiovascular Disease

## 2016-09-28 ENCOUNTER — Telehealth: Payer: Self-pay | Admitting: *Deleted

## 2016-09-28 MED ORDER — EZETIMIBE 10 MG PO TABS: 10.0000 mg | ORAL_TABLET | Freq: Every day | ORAL | 3 refills | Status: DC

## 2016-09-28 NOTE — Telephone Encounter (Signed)
Rx request sent to pharmacy.  

## 2016-09-28 NOTE — Telephone Encounter (Signed)
Representative from [facility] left a msg on the refill vm requesting authorization to change the zetia rx that was sent in on 09/25/16 to generic. Per msg, patient filled generic in August. The call back number provided was [phone] and reference #[id number]. Thanks, MI

## 2016-09-28 NOTE — Telephone Encounter (Signed)
Sent ezetimibe 10 mg Rx to pharmacy.

## 2016-10-01 ENCOUNTER — Telehealth: Payer: Self-pay | Admitting: *Deleted

## 2016-10-01 NOTE — Telephone Encounter (Signed)
Scheduled appt for 11/26/16 @10am .

## 2016-10-11 ENCOUNTER — Encounter: Payer: Self-pay | Admitting: Internal Medicine

## 2016-10-11 MED ORDER — BASAGLAR KWIKPEN 100 UNIT/ML ~~LOC~~ SOPN: 17.0000 [IU] | PEN_INJECTOR | Freq: Every day | SUBCUTANEOUS | 1 refills | Status: DC

## 2016-10-11 NOTE — Telephone Encounter (Signed)
Per Dr. Drue Novel change Basaglar to 17 units, send Rx to [facility] [place].

## 2016-10-22 ENCOUNTER — Encounter: Payer: Self-pay | Admitting: Internal Medicine

## 2016-11-08 ENCOUNTER — Other Ambulatory Visit: Payer: Self-pay | Admitting: Internal Medicine

## 2016-11-12 ENCOUNTER — Other Ambulatory Visit: Payer: Self-pay | Admitting: Cardiovascular Disease

## 2016-11-22 NOTE — Progress Notes (Signed)
Subjective:   [name]. is a 83 y.o. male who presents for Medicare Annual/Subsequent preventive examination. Patient is accompanied by wife.   Review of Systems:  No ROS.  Medicare Wellness Visit.  Cardiac Risk Factors include: advanced age (>69mn, >>36women);diabetes mellitus;dyslipidemia;hypertension;male gender;sedentary lifestyle   Sleep patterns: Sleeps 7-8 hrs per night. Feels rested. Home Safety/Smoke Alarms:  Feels safe in home. Smoke and security alarms in place.  Living environment; residence and Firearm Safety: Lives at home with wife in 1 story home. Guns safely stored. Seat Belt Safety/Bike Helmet: Wears seat belt.   Counseling:   Eye Exam- Wears glasses. Dr.Shapiro yearly. Dental- Dr.Barr every 6 months.  Male:   CCS- Last EXTERNAL: 12/11/04: no result on file.     PSA- No results found for: PSA      Objective:    Vitals: BP 118/60 (BP Location: Right Arm, Patient Position: Sitting, Cuff Size: Normal)   Pulse 72   Ht _0  (1.727 m)   Wt 174 lb 6.4 oz (79.1 kg)   SpO2 97%   BMI 26.52 kg/m   Body mass index is 26.52 kg/m.  Tobacco History  Smoking Status  . Former Smoker  . Types: Cigars  Smokeless Tobacco  . Never Used    Comment: 11/02/2013 "smoked cigars when I was 18 or so; didn't smoke many"     Counseling given: No   Past Medical History:  Diagnosis Date  . Acute CHF ([place] 09/15/2012  . Asthma    pt denies this hx on 11/02/2013  . BPH (benign prostatic hyperplasia)   . CAD (coronary artery disease)    CABG '97, SVG DES 12/13  . Colonic polyp   . Exertional shortness of breath   . GERD (gastroesophageal reflux disease)   . Hearing loss   . History of pancreatitis    pt denies this hx on 11/02/2013  . Hyperlipidemia   . Hypertension   . Intrinsic asthma 06/18/2007   Qualifier: Diagnosis of  By: Dance CMA ([place], Kim    . Ischemic cardiomyopathy 09/16/2012   EF 25%- improved to 45% 3/14  . Myocardial infarction 1997  .  Obstructed, uropathy   . PVD (peripheral vascular disease) (HCC)    moderate bilat carotid disease  . Syncope and collapse 11/02/2013   "didn't pass out" (11/02/2013)  . Type II diabetes mellitus ([place]    Past Surgical History:  Procedure Laterality Date  . CATARACT EXTRACTION W/ INTRAOCULAR LENS  IMPLANT, BILATERAL Bilateral ~ 1970  . CHOLECYSTECTOMY  2000's  . CORONARY ANGIOPLASTY WITH STENT PLACEMENT  Dec 2013   DES to SVG-CFX/PDA  . CORONARY ARTERY BYPASS GRAFT  1997   "CABG X 5"  . INGUINAL HERNIA REPAIR Bilateral ~ 1950  . LEFT HEART CATHETERIZATION WITH CORONARY/GRAFT ANGIOGRAM N/A 09/16/2012   Procedure: LEFT HEART CATHETERIZATION WITH CBeatrix Fetters  Surgeon: JLorretta Harp MD;  [facility];  Service: Cardiovascular;  Laterality: N/A;  . NASAL SEPTUM SURGERY  2007  . RETINAL DETACHMENT SURGERY Left ?1995  . SHOULDER OPEN ROTATOR CUFF REPAIR Right    Family History  Problem Relation Age of Onset  . Prostate cancer Brother     dx ~ 868y/o  . Colon cancer Neg Hx    History  Sexual Activity  . Sexual activity: No    Outpatient Encounter Prescriptions as of 11/26/2016  Medication Sig  . acetaminophen (TYLENOL) 325 MG tablet Take 2 tablets (650 mg total) by  mouth every 4 (four) hours as needed.  Marland Kitchen albuterol (PROAIR HFA) 108 (90 Base) MCG/ACT inhaler Inhale 2 puffs into the lungs every 6 (six) hours as needed for wheezing or shortness of breath.  . Alcohol Swabs (ALCOHOL PREP) 70 % PADS Reported on 04/30/2016  . aspirin EC 81 MG EC tablet Take 2 tablets (162 mg total) by mouth daily.  . Blood Glucose Monitoring Suppl (BLOOD GLUCOSE METER) kit Use as instructed  . clopidogrel (PLAVIX) 75 MG tablet TAKE 1 TABLET DAILY WITH   BREAKFAST  . doxazosin (CARDURA) 4 MG tablet Take 2 mg by mouth at bedtime.   Marland Kitchen ezetimibe (ZETIA) 10 MG tablet Take 1 tablet (10 mg total) by mouth daily.  . finasteride (PROSCAR) 5 MG tablet Take 5 mg by mouth daily.  .  Fluticasone-Salmeterol (ADVAIR) 100-50 MCG/DOSE AEPB Inhale 1 puff into the lungs 2 (two) times daily as needed (shortness of breath).  . furosemide (LASIX) 40 MG tablet TAKE 1 TABLET (40 MG TOTAL) BY MOUTH DAILY.  Marland Kitchen glucose blood test strip Use to test blood sugars twice daily  . Grape Seed 100 MG CAPS Take 2 capsules by mouth daily.  . Insulin Glargine (BASAGLAR KWIKPEN) 100 UNIT/ML SOPN Inject 0.17 mLs (17 Units total) into the skin at bedtime.  . Insulin Pen Needle 32G X 4 MM MISC To use w/ Basaglar  . isosorbide mononitrate (IMDUR) 30 MG 24 hr tablet Take 0.5 tablets (15 mg total) by mouth daily.  Elmore Guise Devices (ADJUSTABLE LANCING DEVICE) MISC Reported on 04/30/2016  . lisinopril (ZESTRIL) 2.5 MG tablet Take 1 tablet (2.5 mg total) by mouth daily.  . metFORMIN (GLUCOPHAGE) 850 MG tablet Take 1.5 tablets (1,275 mg total) by mouth 2 (two) times daily with a meal.  . metoprolol tartrate (LOPRESSOR) 25 MG tablet Take 0.5 tablets (12.5 mg total) by mouth 2 (two) times daily.  . mometasone (NASONEX) 50 MCG/ACT nasal spray Place 2 sprays into the nose daily.  . pantoprazole (PROTONIX) 40 MG tablet TAKE 1 TABLET DAILY  . Propylene Glycol (SYSTANE BALANCE OP) Apply 2 drops to eye. 2 drops each eye daily  . rosuvastatin (CRESTOR) 10 MG tablet Take 1 tablet (10 mg total) by mouth daily.  . [DISCONTINUED] azithromycin (ZITHROMAX) 250 MG tablet Take 2 tablets by mouth on day 1, followed by 1 tablet by mouth daily for 4 days.  . [DISCONTINUED] benzonatate (TESSALON) 100 MG capsule Take 1 capsule (100 mg total) by mouth 3 (three) times daily as needed for cough.   No facility-administered encounter medications on file as of 11/26/2016.     Activities of Daily Living In your present state of health, do you have any difficulty performing the following activities: 11/26/2016 07/30/2016  Hearing? Tempie Donning  Vision? N N  Difficulty concentrating or making decisions? Y N  Walking or climbing stairs? Y N    Dressing or bathing? N N  Doing errands, shopping? N N  Preparing Food and eating ? N -  Using the Toilet? N -  In the past six months, have you accidently leaked urine? N -  Do you have problems with loss of bowel control? N -  Managing your Medications? Y -  Managing your Finances? Y -  Housekeeping or managing your Housekeeping? Y -  Some recent data might be hidden    Patient Care Team: Colon Branch, MD as PCP - General (Internal Medicine) Erlene Quan, PA-C as Physician Assistant (Cardiology) Lorretta Harp, MD as  Consulting Physician (Cardiology) Festus Aloe, MD as Consulting Physician (Urology) Rutherford Guys, MD as Consulting Physician (Ophthalmology)   Assessment:    Physical assessment deferred to PCP.  Exercise Activities and Dietary recommendations Current Exercise Habits: The patient does not participate in regular exercise at present, Exercise limited by: None identified   Diet (meal preparation, eat out, water intake, caffeinated beverages, dairy products, fruits and vegetables): in general, a "healthy" diet  , on average, 3 meals per day Breakfast:  Egg and sausage. Coffee Lunch: Vegetables.  Dinner: meat and vegetables.   Goals    . Maintain current health status.      Fall Risk Fall Risk  11/26/2016 07/30/2016 01/30/2016 06/28/2015 03/29/2015  Falls in the past year? Yes No No No No  Number falls in past yr: 2 or more - - - -  Injury with Fall? No - - - -  Follow up Education provided;Falls prevention discussed - - - -   Depression Screen PHQ 2/9 Scores 11/26/2016 07/30/2016 01/30/2016 06/28/2015  PHQ - 2 Score 0 0 0 0    Cognitive Function MMSE - Mini Mental State Exam 11/26/2016  Not completed: (No Data)  Orientation to time 2  Orientation to Place 3  Registration 3  Attention/ Calculation 5  Recall 0  Language- name 2 objects 2  Language- repeat 1  Language- follow 3 step command 1  Language- read & follow direction 1  Write a sentence 0   Copy design 0  Total score 18   Dr.Paz notified of MMSE score--will address with pt today at 11am appt.      Immunization History  Administered Date(s) Administered  . Influenza Split 09/16/2012, 10/25/2012  . Influenza Whole 08/04/2009  . Influenza, High Dose Seasonal PF 06/28/2015, 07/30/2016  . Influenza,inj,Quad PF,36+ Mos 08/04/2014  . Influenza-Unspecified 06/15/2013  . Pneumococcal Conjugate-13 08/04/2014  . Pneumococcal Polysaccharide-23 08/15/2001  . Td 08/04/2009  . Zoster 03/24/2011   Screening Tests Health Maintenance  Topic Date Due  . HEMOGLOBIN A1C  01/28/2017  . OPHTHALMOLOGY EXAM  09/17/2017  . FOOT EXAM  11/26/2017  . TETANUS/TDAP  08/05/2019  . INFLUENZA VACCINE  Completed  . ZOSTAVAX  Completed  . PNA vac Low Risk Adult  Completed      Plan:     Follow up with Dr.Paz as scheduled.  Continue doing brain stimulating activities (puzzles, reading, adult coloring books, staying active) to keep memory sharp.   Continue to eat heart healthy diet (full of fruits, vegetables, whole grains, lean protein, water--limit salt, fat, and sugar intake) and increase physical activity as tolerated.  During the course of the visit the patient was educated and counseled about the following appropriate screening and preventive services:   Vaccines to include Pneumoccal, Influenza, Hepatitis B, Td, Zostavax, HCV  Cardiovascular Disease  Colorectal cancer screening  Diabetes screening  Prostate Cancer Screening  Glaucoma screening  Nutrition counseling   Patient Instructions (the written plan) was given to the patient.    Naaman Plummer [place], [place]  11/26/2016  Kathlene November, MD

## 2016-11-22 NOTE — Progress Notes (Signed)
Pre visit review using our clinic review tool, if applicable. No additional management support is needed unless otherwise documented below in the visit note. 

## 2016-11-26 ENCOUNTER — Encounter: Payer: Self-pay | Admitting: Internal Medicine

## 2016-11-26 ENCOUNTER — Ambulatory Visit (INDEPENDENT_AMBULATORY_CARE_PROVIDER_SITE_OTHER): Payer: Medicare Other | Admitting: Internal Medicine

## 2016-11-26 ENCOUNTER — Ambulatory Visit: Payer: Medicare Other | Admitting: *Deleted

## 2016-11-26 VITALS — BP 98/62 | HR 72 | Ht 68.0 in | Wt 174.4 lb

## 2016-11-26 DIAGNOSIS — Z794 Long term (current) use of insulin: Secondary | ICD-10-CM

## 2016-11-26 DIAGNOSIS — F0390 Unspecified dementia without behavioral disturbance: Secondary | ICD-10-CM | POA: Insufficient documentation

## 2016-11-26 DIAGNOSIS — R413 Other amnesia: Secondary | ICD-10-CM | POA: Diagnosis not present

## 2016-11-26 DIAGNOSIS — J45909 Unspecified asthma, uncomplicated: Secondary | ICD-10-CM

## 2016-11-26 DIAGNOSIS — I10 Essential (primary) hypertension: Secondary | ICD-10-CM | POA: Diagnosis not present

## 2016-11-26 DIAGNOSIS — E785 Hyperlipidemia, unspecified: Secondary | ICD-10-CM | POA: Diagnosis not present

## 2016-11-26 DIAGNOSIS — Z0000 Encounter for general adult medical examination without abnormal findings: Secondary | ICD-10-CM

## 2016-11-26 DIAGNOSIS — E1159 Type 2 diabetes mellitus with other circulatory complications: Secondary | ICD-10-CM

## 2016-11-26 LAB — CBC WITH DIFFERENTIAL/PLATELET
Basophils Absolute: 0.2 10*3/uL — ABNORMAL HIGH (ref 0.0–0.1)
Basophils Relative: 1.3 % (ref 0.0–3.0)
EOS PCT: 12 % — AB (ref 0.0–5.0)
Eosinophils Absolute: 1.4 10*3/uL — ABNORMAL HIGH (ref 0.0–0.7)
HCT: 38.2 % — ABNORMAL LOW (ref 39.0–52.0)
HEMOGLOBIN: 13.2 g/dL (ref 13.0–17.0)
LYMPHS ABS: 1.4 10*3/uL (ref 0.7–4.0)
LYMPHS PCT: 12.7 % (ref 12.0–46.0)
MCHC: 34.7 g/dL (ref 30.0–36.0)
MCV: 92.2 fl (ref 78.0–100.0)
MONOS PCT: 10.7 % (ref 3.0–12.0)
Monocytes Absolute: 1.2 10*3/uL — ABNORMAL HIGH (ref 0.1–1.0)
NEUTROS PCT: 63.3 % (ref 43.0–77.0)
Neutro Abs: 7.2 10*3/uL (ref 1.4–7.7)
Platelets: 230 10*3/uL (ref 150.0–400.0)
RBC: 4.14 Mil/uL — AB (ref 4.22–5.81)
RDW: 13.5 % (ref 11.5–15.5)
WBC: 11.4 10*3/uL — ABNORMAL HIGH (ref 4.0–10.5)

## 2016-11-26 LAB — BASIC METABOLIC PANEL
BUN: 16 mg/dL (ref 6–23)
CO2: 30 mEq/L (ref 19–32)
Calcium: 9.4 mg/dL (ref 8.4–10.5)
Chloride: 99 mEq/L (ref 96–112)
Creatinine, Ser: 0.77 mg/dL (ref 0.40–1.50)
GFR: 101.18 mL/min (ref >60.00)
GLUCOSE: 131 mg/dL — AB (ref 70–99)
POTASSIUM: 4.1 meq/L (ref 3.5–5.1)
Sodium: 136 mEq/L (ref 135–145)

## 2016-11-26 LAB — HEMOGLOBIN A1C: Hgb A1c MFr Bld: 7.5 % — ABNORMAL HIGH (ref 4.6–6.5)

## 2016-11-26 MED ORDER — AZELASTINE HCL 0.1 % NA SOLN: 2.0000 | Freq: Every day | NASAL | 5 refills | Status: DC

## 2016-11-26 NOTE — Progress Notes (Signed)
Subjective:    Patient ID: Ryan Plumb., male    DOB: [birth year], 83 y.o.   MRN: [id number]  DOS:  11/26/2016 Type of visit - description : rov, Here with his [name] Interval history:  HTN: Good medication compliance, BP upon arrival 90/50. He wasn't feeling poorly. Asthma: on Advair at least qd, does not use twice a day every day. Sometimes he coughs at night, cough is triggered by postnasal dripping. Uses albuterol most days. DM: Good med compliance, checks blood sugars sporadically, this morning was 172, could not give me more readings. Denies symptoms consistent with low blood sugar. Memory issues: Both the [name] and the patient recognizes his memory has decreased, for example he could not do grocery shopping, he is still drives (close to his house only), has never gotten lost. [name] reports that she feels safe while the patient drives    BP Readings from Last 3 Encounters:  11/26/16 118/60  09/27/16 (!) 125/46  07/30/16 122/64    Review of Systems Denies chest pain, DOE at baseline No nausea, vomiting, diarrhea.   Past Medical History:  Diagnosis Date  . Acute CHF ([place]) 09/15/2012  . Asthma    pt denies this hx on 11/02/2013  . BPH (benign prostatic hyperplasia)   . CAD (coronary artery disease)    CABG '97, SVG DES 12/13  . Colonic polyp   . Exertional shortness of breath   . GERD (gastroesophageal reflux disease)   . Hearing loss   . History of pancreatitis    pt denies this hx on 11/02/2013  . Hyperlipidemia   . Hypertension   . Intrinsic asthma 06/18/2007   Qualifier: Diagnosis of  By: Dance CMA ([place]), Kim    . Ischemic cardiomyopathy 09/16/2012   EF 25%- improved to 45% 3/14  . Myocardial infarction 1997  . Obstructed, uropathy   . PVD (peripheral vascular disease) ([facility])    moderate bilat carotid disease  . Syncope and collapse 11/02/2013   "didn't pass out" (11/02/2013)  . Type II diabetes mellitus ([place])     Past Surgical History:  Procedure Laterality  Date  . CATARACT EXTRACTION W/ INTRAOCULAR LENS  IMPLANT, BILATERAL Bilateral ~ 1970  . CHOLECYSTECTOMY  2000's  . CORONARY ANGIOPLASTY WITH STENT PLACEMENT  Dec 2013   DES to SVG-CFX/PDA  . CORONARY ARTERY BYPASS GRAFT  1997   "CABG X 5"  . INGUINAL HERNIA REPAIR Bilateral ~ 1950  . LEFT HEART CATHETERIZATION WITH CORONARY/GRAFT ANGIOGRAM N/A 09/16/2012   Procedure: LEFT HEART CATHETERIZATION WITH Beatrix Fetters;  Surgeon: Lorretta Harp, MD;  Location: [facility] CATH LAB;  Service: Cardiovascular;  Laterality: N/A;  . NASAL SEPTUM SURGERY  2007  . RETINAL DETACHMENT SURGERY Left ?1995  . SHOULDER OPEN ROTATOR CUFF REPAIR Right     Social History   Social History  . Marital status: Married    Spouse name: N/A  . Number of children: 1  . Years of education: N/A   Occupational History  . 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 Smoking status: Former Smoker    Types: Cigars  . Smokeless tobacco: Never Used     Comment: 11/02/2013 "smoked cigars when I was 18 or so; didn't smoke many"  . Alcohol use 1.2 oz/week    2 Glasses of wine per week  . Drug use: No  . Sexual activity: No   Other Topics Concern  . 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 Always use your most recent med list.          acetaminophen 325 MG tablet Commonly known as:  TYLENOL Take 2 tablets (650 mg total) by mouth every 4 (four) hours as needed.   Adjustable Lancing Device Misc Reported on 04/30/2016   albuterol 108 (90 Base) MCG/ACT inhaler Commonly known as:  PROAIR HFA Inhale 2 puffs into the lungs every 6 (six) hours as needed  for wheezing or shortness of breath.   Alcohol Prep 70 % Pads Reported on 04/30/2016   aspirin 81 MG EC tablet Take 2 tablets (162 mg total) by mouth daily.   BASAGLAR KWIKPEN 100 UNIT/ML Sopn Inject 0.17 mLs (17 Units total) into the skin at bedtime.   blood glucose meter kit and supplies Use as instructed   clopidogrel 75 MG tablet Commonly known as:  PLAVIX TAKE 1 TABLET DAILY WITH   BREAKFAST   doxazosin 4 MG tablet Commonly known as:  CARDURA Take 2 mg by mouth at bedtime.   ezetimibe 10 MG tablet Commonly known as:  ZETIA Take 1 tablet (10 mg total) by mouth daily.   finasteride 5 MG tablet Commonly known as:  PROSCAR Take 5 mg by mouth daily.   Fluticasone-Salmeterol 100-50 MCG/DOSE Aepb Commonly known as:  ADVAIR Inhale 1 puff into the lungs 2 (two) times daily as needed (shortness of breath).   furosemide 40 MG tablet Commonly known as:  LASIX TAKE 1 TABLET (40 MG TOTAL) BY MOUTH DAILY.   glucose blood test strip Use to test blood sugars twice daily   Grape Seed 100 MG Caps Take 2 capsules by mouth daily.   Insulin Pen Needle 32G X 4 MM Misc To use w/ Basaglar   isosorbide mononitrate 30 MG 24 hr tablet Commonly known as:  IMDUR Take 0.5 tablets (15 mg total) by mouth daily.   lisinopril 2.5 MG tablet Commonly known as:  ZESTRIL Take 1 tablet (2.5 mg total) by mouth daily.   metFORMIN 850 MG tablet Commonly known as:  GLUCOPHAGE Take 1.5 tablets (1,275 mg total) by mouth 2 (two) times daily with a meal.   metoprolol tartrate 25 MG tablet Commonly known as:  LOPRESSOR Take 0.5 tablets (12.5 mg total) by mouth 2 (two) times daily.   mometasone 50 MCG/ACT nasal spray Commonly known as:  NASONEX Place 2 sprays into the nose daily.   pantoprazole 40 MG tablet Commonly known as:  PROTONIX TAKE 1 TABLET DAILY   rosuvastatin 10 MG tablet Commonly known as:  CRESTOR Take 1 tablet (10 mg total) by mouth daily.   SYSTANE BALANCE OP Apply 2  drops to eye. 2 drops each eye daily          Objective:   Physical Exam BP 118/60 (BP Location: Right Arm, Patient Position: Sitting, Cuff Size: Normal)   Pulse 72   Ht _0  (1.727 m)   Wt 174 lb 6.4 oz (79.1 kg)   SpO2 97%   BMI 26.52 kg/m  General:   Well developed, well nourished . NAD.  HEENT:  Normocephalic . Face symmetric, atraumatic Lungs:  CTA B Normal respiratory effort, no intercostal retractions, no accessory muscle use. Heart: RRR,  no murmur.  No pretibial edema bilaterally  Skin: Not pale. Not jaundice DIABETIC FEET EXAM: No lower extremity edema Normal pedal pulses bilaterally Skin normal, nails normal, no calluses Pinprick examination of the feet normal. Neurologic:  alert & to self, time (except year), partially to place  Speech normal, gait appropriate for age and unassisted Psych--  Cognition and judgment appear intact.  Cooperative with normal attention span and concentration.  Behavior appropriate. No anxious or depressed appearing.      Assessment & Plan:    Assessment > Diabetes, no neuropathy as of 10-2015 10-2015: d/c Lantus but had to go back on it 10-2015 , dc Actos (dt CHF) and start metformin Hyperlipidemia Hypertension CV: --CAD, CABG 9798 --CHF: Systolic, diastolic, EF echocardiogram 2014---45% --Peripheral vascular disease OSA: On CPAP Asthma Hearing loss   PLAN DM: Continue insulin 17 units, metformin. A1c Hyperlipidemia: Well-controlled per last FLP HTN: BP upon arrival 90/50. Ambulatory BPs usually 125/50. We recheck his BP and was 98/62. Patient feels well. BPs have always been okay at the office, will ask patient to continue checking and call in one week. Consider adjust medication. Check a CBC Continue with Lopressor, lisinopril, Imdur, Lasix, Cardura Asthma: Recommend to take Advair twice a day to minimize the use of albuterol. Also reports cough at night, triggered by PND. Continue Flonase, add Astelin  CAD: Asx,  to see cardiology soon Impairment memory: MMSE 18 (see medicare wellness)  patient is is still functional, very limited driving, [name] feel safe when he drives, I still rec to reconsider driving at all. Discussed medications (revisit meds on RTC) RTC 3 months

## 2016-11-26 NOTE — Patient Instructions (Addendum)
Follow up with Dr.Paz as scheduled.  Continue doing brain stimulating activities (puzzles, reading, adult coloring books, staying active) to keep memory sharp.   Continue to eat heart healthy diet (full of fruits, vegetables, whole grains, lean protein, water--limit salt, fat, and sugar intake) and increase physical activity as tolerated.  =================   GO TO THE LAB : Get the blood work     GO TO THE FRONT DESK Schedule your next appointment for a  checkup in 3 months   Check the  blood pressure   daily Be sure your blood pressure is between 110/65 and  145/85. Call with readings in one week If it is consistently higher or lower, let me know   For asthma: Advair twice a day Albuterol as needed only Continue Flonase, add Astelin at bedtime  Consider stop driving

## 2016-11-26 NOTE — Assessment & Plan Note (Signed)
DM: Continue insulin 17 units, metformin. A1c Hyperlipidemia: Well-controlled per last FLP HTN: BP upon arrival 90/50. Ambulatory BPs usually 125/50. We recheck his BP and was 98/62. Patient feels well. BPs have always been okay at the office, will ask patient to continue checking and call in one week. Consider adjust medication. Check a CBC Continue with Lopressor, lisinopril, Imdur, Lasix, Cardura Asthma: Recommend to take Advair twice a day to minimize the use of albuterol. Also reports cough at night, triggered by PND. Continue Flonase, add Astelin  CAD: Asx, to see cardiology soon Impairment memory: MMSE 18 (see medicare wellness)  patient is is still functional, very limited driving, wife feel safe when he drives, I still rec to reconsider driving at all. Discussed medications (revisit meds on RTC) RTC 3 months

## 2016-11-30 ENCOUNTER — Encounter: Payer: Self-pay | Admitting: Cardiology

## 2016-12-04 ENCOUNTER — Encounter: Payer: Self-pay | Admitting: Cardiology

## 2016-12-04 ENCOUNTER — Ambulatory Visit (INDEPENDENT_AMBULATORY_CARE_PROVIDER_SITE_OTHER): Payer: Medicare Other | Admitting: Cardiology

## 2016-12-04 VITALS — BP 119/67 | HR 66 | Ht 67.0 in | Wt 174.6 lb

## 2016-12-04 DIAGNOSIS — Z959 Presence of cardiac and vascular implant and graft, unspecified: Secondary | ICD-10-CM | POA: Diagnosis not present

## 2016-12-04 DIAGNOSIS — I255 Ischemic cardiomyopathy: Secondary | ICD-10-CM

## 2016-12-04 DIAGNOSIS — I2589 Other forms of chronic ischemic heart disease: Secondary | ICD-10-CM

## 2016-12-04 DIAGNOSIS — I779 Disorder of arteries and arterioles, unspecified: Secondary | ICD-10-CM | POA: Diagnosis not present

## 2016-12-04 DIAGNOSIS — Z951 Presence of aortocoronary bypass graft: Secondary | ICD-10-CM | POA: Diagnosis not present

## 2016-12-04 DIAGNOSIS — I739 Peripheral vascular disease, unspecified: Secondary | ICD-10-CM

## 2016-12-04 DIAGNOSIS — Z95820 Peripheral vascular angioplasty status with implants and grafts: Secondary | ICD-10-CM

## 2016-12-04 DIAGNOSIS — I10 Essential (primary) hypertension: Secondary | ICD-10-CM | POA: Diagnosis not present

## 2016-12-04 NOTE — Patient Instructions (Signed)
Medication Instructions:  Your physician recommends that you continue on your current medications as directed. Please refer to the Current Medication list given to you today.  Labwork: none  Testing/Procedures: none  Follow-Up: Your physician wants you to follow-up in: 6 month ov with Dr Berry You will receive a reminder letter in the mail two months in advance. If you don't receive a letter, please call our office to schedule the follow-up appointment.  If you need a refill on your cardiac medications before your next appointment, please call your pharmacy.  

## 2016-12-04 NOTE — Assessment & Plan Note (Signed)
Controlled.  

## 2016-12-04 NOTE — Assessment & Plan Note (Signed)
60-79% RICA, 40-59% LICA Aug 2017

## 2016-12-04 NOTE — Assessment & Plan Note (Signed)
Stent to prox SVG to LCX-PDA 09/26/12-stable, no angina

## 2016-12-04 NOTE — Progress Notes (Signed)
12/04/2016 [name].   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 He was admitted in December 2013 with [place]. He underwent cath and subsequent SVG-CFX/PDA DES. His LV was depressed at that time and he went home with a Life Vest but fortunately his EF improved to 45% by echo March 2014 and Life Vest was discontinued. He did have some problems with CHF secondary to dietary indiscretion but this has stabilized. Ryan [name] had him do a Myoview in Aug 2017 and this showed scar but no ischemia, no change from his 2012 Myoview. His EF then was 41% by Myoview. He is in the office today for a 6 month check. Since we saw him last he has been doing well. He does have some DOE but no orthopnea or unusual DOE. His wife tells me that she thinks the nasal sprays prescribed by [name] have helped decrease his cough.   Current Outpatient Prescriptions  Medication Sig Dispense Refill  . acetaminophen (TYLENOL) 325 MG tablet Take 2 tablets (650 mg total) by mouth every 4 (four) hours as needed.    Marland Kitchen albuterol (PROAIR HFA) 108 (90 Base) MCG/ACT inhaler Inhale 2 puffs into the lungs every 6 (six) hours as needed for wheezing or shortness of breath. 18 g 5  . Alcohol Swabs (ALCOHOL PREP) 70 % PADS Reported on 04/30/2016    . aspirin EC 81 MG EC tablet Take 2 tablets (162 mg total) by mouth daily.    Marland Kitchen azelastine (ASTELIN) 0.1 % nasal spray Place 2 sprays into both nostrils at bedtime. Use in each nostril as directed 30 mL 5  . Blood Glucose Monitoring Suppl (BLOOD GLUCOSE METER) kit Use as instructed 1 each 0  . clopidogrel (PLAVIX) 75 MG tablet TAKE 1 TABLET DAILY WITH   BREAKFAST 90 tablet 2  . doxazosin (CARDURA) 4 MG tablet Take 2 mg by mouth at bedtime.     Marland Kitchen ezetimibe (ZETIA) 10 MG tablet Take 1 tablet (10 mg total) by mouth daily. 90 tablet 3  . finasteride  (PROSCAR) 5 MG tablet Take 5 mg by mouth daily.    . Fluticasone-Salmeterol (ADVAIR) 100-50 MCG/DOSE AEPB Inhale 1 puff into the lungs 2 (two) times daily as needed (shortness of breath). 180 each 3  . furosemide (LASIX) 40 MG tablet TAKE 1 TABLET (40 MG TOTAL) BY MOUTH DAILY. 30 tablet 10  . glucose blood test strip Use to test blood sugars twice daily 100 each 5  . Grape Seed 100 MG CAPS Take 2 capsules by mouth daily.    . Insulin Glargine (BASAGLAR KWIKPEN) 100 UNIT/ML SOPN Inject 0.17 mLs (17 Units total) into the skin at bedtime. 45 mL 1  . Insulin Pen Needle 32G X 4 MM MISC To use w/ Basaglar 100 each 12  . isosorbide mononitrate (IMDUR) 30 MG 24 hr tablet Take 0.5 tablets (15 mg total) by mouth daily. 90 tablet 3  . Lancet Devices (ADJUSTABLE LANCING DEVICE) MISC Reported on 04/30/2016    . lisinopril (ZESTRIL) 2.5 MG tablet Take 1 tablet (2.5 mg total) by mouth daily. 90 tablet 3  . metFORMIN (GLUCOPHAGE) 850 MG tablet Take 1.5 tablets (1,275 mg total) by mouth 2 (two) times daily with a meal. 270 tablet 1  . metoprolol tartrate (LOPRESSOR) 25 MG tablet Take 0.5 tablets (12.5 mg  total) by mouth 2 (two) times daily. 90 tablet 3  . mometasone (NASONEX) 50 MCG/ACT nasal spray Place 2 sprays into the nose daily. 17 g 5  . pantoprazole (PROTONIX) 40 MG tablet TAKE 1 TABLET DAILY 90 tablet 3  . Propylene Glycol (SYSTANE BALANCE OP) Apply 2 drops to eye. 2 drops each eye daily    . rosuvastatin (CRESTOR) 10 MG tablet Take 1 tablet (10 mg total) by mouth daily. 90 tablet 3   No current facility-administered medications for this visit.     Allergies  Allergen Reactions  . Colesevelam     unknown  . Ezetimibe-Simvastatin     unknown    Social History   Social History  . Marital status: Married    Spouse name: N/A  . Number of children: 1  . Years of education: N/A   Occupational History  . 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 Smoking status:  Former Smoker    Types: Cigars  . Smokeless tobacco: Never Used     Comment: 11/02/2013 "smoked cigars when I was 18 or so; didn't smoke many"  . Alcohol use 1.2 oz/week    2 Glasses of wine per week  . Drug use: No  . Sexual activity: No   Other Topics Concern  . Not on file   Social History Narrative   Married '50-24 yrs, divorced; married '82   1 son- '51   Retired but keeps up 4 rental houses, mows   End of Life: no CPR, no heroic or futile measures     Review of Systems: General: negative for chills, fever, night sweats or weight changes.  Cardiovascular: negative for chest pain, dyspnea on exertion, edema, orthopnea, palpitations, paroxysmal nocturnal dyspnea or shortness of breath Dermatological: negative for rash Respiratory: negative for cough or wheezing Urologic: negative for hematuria Abdominal: negative for nausea, vomiting, diarrhea, bright red blood per rectum, melena, or hematemesis Neurologic: negative for visual changes, syncope, or dizziness All other systems reviewed and are otherwise negative except as noted above.    Blood pressure 119/67, pulse 66, height _0  (1.702 m), weight 174 lb 9.6 oz (79.2 kg).  General appearance: alert, cooperative and no distress Neck: no carotid bruit and no JVD Lungs: few expiratory wheezes Heart: regular rate and rhythm Extremities: extremities normal, atraumatic, no cyanosis or edema Skin: Skin color, texture, turgor normal. No rashes or lesions Neurologic: Grossly normal  EKG NSR-HR 66, no acute changes  ASSESSMENT AND PLAN:   S/P angioplasty with stent  Stent to prox SVG to LCX-PDA 09/26/12-stable, no angina  Hx of CABG Stent to prox SVG to LCX-PDA 09/26/12   ICM, EF 20-25% by cath 09/16/12- improved to 45 % by Echo 3/14 Ischemic cardiomyopathy-EF 41% by Myoview Aug 2017  Essential hypertension Controlled  Carotid artery disease (HCC) 17-61% RICA, 60-73% LICA Aug 7106   PLAN  [name] is doing well, no  change in his medications, f/u Ryan [name] in 6 months  Kerin Ransom PA-C 12/04/2016 12:07 PM

## 2016-12-04 NOTE — Assessment & Plan Note (Signed)
Ischemic cardiomyopathy-EF 41% by [facility] Aug 2017

## 2016-12-04 NOTE — Assessment & Plan Note (Signed)
Stent to prox SVG to LCX-PDA 09/26/12  

## 2016-12-07 ENCOUNTER — Other Ambulatory Visit: Payer: Self-pay | Admitting: Cardiovascular Disease

## 2016-12-14 ENCOUNTER — Encounter: Payer: Self-pay | Admitting: Internal Medicine

## 2016-12-26 ENCOUNTER — Ambulatory Visit: Payer: Medicare Other | Admitting: Internal Medicine

## 2017-01-13 ENCOUNTER — Encounter (HOSPITAL_COMMUNITY): Payer: Self-pay | Admitting: Emergency Medicine

## 2017-01-13 ENCOUNTER — Emergency Department (HOSPITAL_COMMUNITY): Payer: Medicare Other

## 2017-01-13 ENCOUNTER — Observation Stay (HOSPITAL_COMMUNITY)
Admission: EM | Admit: 2017-01-13 | Discharge: 2017-01-15 | Disposition: A | Discharge status: Home / Self Care | Payer: Medicare Other | Attending: Internal Medicine | Admitting: Internal Medicine

## 2017-01-13 DIAGNOSIS — I6523 Occlusion and stenosis of bilateral carotid arteries: Secondary | ICD-10-CM | POA: Insufficient documentation

## 2017-01-13 DIAGNOSIS — N400 Enlarged prostate without lower urinary tract symptoms: Secondary | ICD-10-CM | POA: Diagnosis not present

## 2017-01-13 DIAGNOSIS — I2510 Atherosclerotic heart disease of native coronary artery without angina pectoris: Secondary | ICD-10-CM | POA: Diagnosis not present

## 2017-01-13 DIAGNOSIS — Z87891 Personal history of nicotine dependence: Secondary | ICD-10-CM | POA: Insufficient documentation

## 2017-01-13 DIAGNOSIS — I2583 Coronary atherosclerosis due to lipid rich plaque: Secondary | ICD-10-CM

## 2017-01-13 DIAGNOSIS — I5042 Chronic combined systolic (congestive) and diastolic (congestive) heart failure: Secondary | ICD-10-CM | POA: Diagnosis not present

## 2017-01-13 DIAGNOSIS — K219 Gastro-esophageal reflux disease without esophagitis: Secondary | ICD-10-CM | POA: Insufficient documentation

## 2017-01-13 DIAGNOSIS — I110 Hypertensive heart disease with heart failure: Secondary | ICD-10-CM | POA: Insufficient documentation

## 2017-01-13 DIAGNOSIS — I10 Essential (primary) hypertension: Secondary | ICD-10-CM | POA: Diagnosis not present

## 2017-01-13 DIAGNOSIS — Z951 Presence of aortocoronary bypass graft: Secondary | ICD-10-CM | POA: Insufficient documentation

## 2017-01-13 DIAGNOSIS — E785 Hyperlipidemia, unspecified: Secondary | ICD-10-CM | POA: Diagnosis not present

## 2017-01-13 DIAGNOSIS — I255 Ischemic cardiomyopathy: Secondary | ICD-10-CM | POA: Diagnosis not present

## 2017-01-13 DIAGNOSIS — Z794 Long term (current) use of insulin: Secondary | ICD-10-CM | POA: Insufficient documentation

## 2017-01-13 DIAGNOSIS — G4730 Sleep apnea, unspecified: Secondary | ICD-10-CM | POA: Diagnosis not present

## 2017-01-13 DIAGNOSIS — I252 Old myocardial infarction: Secondary | ICD-10-CM | POA: Insufficient documentation

## 2017-01-13 DIAGNOSIS — I739 Peripheral vascular disease, unspecified: Secondary | ICD-10-CM

## 2017-01-13 DIAGNOSIS — Z66 Do not resuscitate: Secondary | ICD-10-CM | POA: Diagnosis not present

## 2017-01-13 DIAGNOSIS — R55 Syncope and collapse: Secondary | ICD-10-CM | POA: Diagnosis not present

## 2017-01-13 DIAGNOSIS — Z7902 Long term (current) use of antithrombotics/antiplatelets: Secondary | ICD-10-CM | POA: Diagnosis not present

## 2017-01-13 DIAGNOSIS — Z7982 Long term (current) use of aspirin: Secondary | ICD-10-CM | POA: Diagnosis not present

## 2017-01-13 DIAGNOSIS — Z955 Presence of coronary angioplasty implant and graft: Secondary | ICD-10-CM | POA: Insufficient documentation

## 2017-01-13 DIAGNOSIS — E1159 Type 2 diabetes mellitus with other circulatory complications: Secondary | ICD-10-CM

## 2017-01-13 DIAGNOSIS — J45909 Unspecified asthma, uncomplicated: Secondary | ICD-10-CM | POA: Insufficient documentation

## 2017-01-13 DIAGNOSIS — E118 Type 2 diabetes mellitus with unspecified complications: Secondary | ICD-10-CM

## 2017-01-13 DIAGNOSIS — IMO0001 Reserved for inherently not codable concepts without codable children: Secondary | ICD-10-CM

## 2017-01-13 DIAGNOSIS — E1151 Type 2 diabetes mellitus with diabetic peripheral angiopathy without gangrene: Secondary | ICD-10-CM | POA: Diagnosis not present

## 2017-01-13 DIAGNOSIS — I779 Disorder of arteries and arterioles, unspecified: Secondary | ICD-10-CM | POA: Diagnosis not present

## 2017-01-13 DIAGNOSIS — Z79899 Other long term (current) drug therapy: Secondary | ICD-10-CM | POA: Insufficient documentation

## 2017-01-13 LAB — URINALYSIS, ROUTINE W REFLEX MICROSCOPIC
Bilirubin Urine: NEGATIVE
GLUCOSE, UA: 150 mg/dL — AB
HGB URINE DIPSTICK: NEGATIVE
Ketones, ur: NEGATIVE mg/dL
Leukocytes, UA: NEGATIVE
Nitrite: NEGATIVE
Protein, ur: NEGATIVE mg/dL
SPECIFIC GRAVITY, URINE: 1.021 (ref 1.005–1.030)
pH: 5 (ref 5.0–8.0)

## 2017-01-13 LAB — CBC
HCT: 36.5 % — ABNORMAL LOW (ref 39.0–52.0)
HEMOGLOBIN: 12.3 g/dL — AB (ref 13.0–17.0)
MCH: 30.7 pg (ref 26.0–34.0)
MCHC: 33.7 g/dL (ref 30.0–36.0)
MCV: 91 fL (ref 78.0–100.0)
PLATELETS: 205 10*3/uL (ref 150–400)
RBC: 4.01 MIL/uL — AB (ref 4.22–5.81)
RDW: 13.1 % (ref 11.5–15.5)
WBC: 10.5 10*3/uL (ref 4.0–10.5)

## 2017-01-13 LAB — GLUCOSE, CAPILLARY: GLUCOSE-CAPILLARY: 173 mg/dL — AB (ref 65–99)

## 2017-01-13 LAB — BASIC METABOLIC PANEL
ANION GAP: 12 (ref 5–15)
BUN: 18 mg/dL (ref 6–20)
CHLORIDE: 98 mmol/L — AB (ref 101–111)
CO2: 25 mmol/L (ref 22–32)
Calcium: 9.1 mg/dL (ref 8.9–10.3)
Creatinine, Ser: 1.22 mg/dL (ref 0.61–1.24)
GFR calc Af Amer: 59 mL/min — ABNORMAL LOW (ref >60)
GFR calc non Af Amer: 51 mL/min — ABNORMAL LOW (ref >60)
Glucose, Bld: 240 mg/dL — ABNORMAL HIGH (ref 65–99)
POTASSIUM: 4 mmol/L (ref 3.5–5.1)
Sodium: 135 mmol/L (ref 135–145)

## 2017-01-13 MED ORDER — SODIUM CHLORIDE 0.9% FLUSH
3.0000 mL | Freq: Two times a day (BID) | INTRAVENOUS | Status: DC
  Administered 2017-01-14 – 2017-01-15 (×3): 3 mL via INTRAVENOUS

## 2017-01-13 MED ORDER — ONDANSETRON HCL 4 MG PO TABS: 4.0000 mg | ORAL_TABLET | Freq: Four times a day (QID) | ORAL | Status: DC | PRN

## 2017-01-13 MED ORDER — SODIUM CHLORIDE 0.9% FLUSH
3.0000 mL | Freq: Two times a day (BID) | INTRAVENOUS | Status: DC
  Administered 2017-01-13 – 2017-01-15 (×3): 3 mL via INTRAVENOUS

## 2017-01-13 MED ORDER — INSULIN GLARGINE 100 UNIT/ML ~~LOC~~ SOLN
17.0000 [IU] | Freq: Every day | SUBCUTANEOUS | Status: DC
  Filled 2017-01-13 (×2): qty 0.17

## 2017-01-13 MED ORDER — EZETIMIBE 10 MG PO TABS
10.0000 mg | ORAL_TABLET | Freq: Every day | ORAL | Status: DC
  Administered 2017-01-14 – 2017-01-15 (×2): 10 mg via ORAL
  Filled 2017-01-13 (×2): qty 1

## 2017-01-13 MED ORDER — METOPROLOL TARTRATE 12.5 MG HALF TABLET
12.5000 mg | ORAL_TABLET | Freq: Two times a day (BID) | ORAL | Status: DC
  Administered 2017-01-14 – 2017-01-15 (×3): 12.5 mg via ORAL
  Filled 2017-01-13 (×3): qty 1

## 2017-01-13 MED ORDER — ENOXAPARIN SODIUM 40 MG/0.4ML ~~LOC~~ SOLN
40.0000 mg | SUBCUTANEOUS | Status: DC
  Administered 2017-01-13 – 2017-01-14 (×2): 40 mg via SUBCUTANEOUS
  Filled 2017-01-13 (×2): qty 0.4

## 2017-01-13 MED ORDER — ASPIRIN EC 81 MG PO TBEC
162.0000 mg | DELAYED_RELEASE_TABLET | Freq: Every day | ORAL | Status: DC
  Administered 2017-01-14 – 2017-01-15 (×2): 162 mg via ORAL
  Filled 2017-01-13 (×2): qty 2

## 2017-01-13 MED ORDER — LISINOPRIL 2.5 MG PO TABS
2.5000 mg | ORAL_TABLET | Freq: Every day | ORAL | Status: DC
  Administered 2017-01-14 – 2017-01-15 (×2): 2.5 mg via ORAL
  Filled 2017-01-13 (×2): qty 1

## 2017-01-13 MED ORDER — ALBUTEROL SULFATE (2.5 MG/3ML) 0.083% IN NEBU: 3.0000 mL | INHALATION_SOLUTION | Freq: Four times a day (QID) | RESPIRATORY_TRACT | Status: DC | PRN

## 2017-01-13 MED ORDER — ISOSORBIDE MONONITRATE ER 30 MG PO TB24
15.0000 mg | ORAL_TABLET | Freq: Every day | ORAL | Status: DC
  Administered 2017-01-14 – 2017-01-15 (×2): 15 mg via ORAL
  Filled 2017-01-13 (×2): qty 1

## 2017-01-13 MED ORDER — AZELASTINE HCL 0.1 % NA SOLN: 2.0000 | Freq: Every evening | NASAL | Status: DC | PRN

## 2017-01-13 MED ORDER — CLOPIDOGREL BISULFATE 75 MG PO TABS
75.0000 mg | ORAL_TABLET | Freq: Every day | ORAL | Status: DC
  Administered 2017-01-14 – 2017-01-15 (×2): 75 mg via ORAL
  Filled 2017-01-13 (×2): qty 1

## 2017-01-13 MED ORDER — PANTOPRAZOLE SODIUM 40 MG PO TBEC
40.0000 mg | DELAYED_RELEASE_TABLET | Freq: Every day | ORAL | Status: DC
Start: 2017-01-14 — End: 2017-01-15
  Administered 2017-01-14 – 2017-01-15 (×2): 40 mg via ORAL
  Filled 2017-01-13 (×2): qty 1

## 2017-01-13 MED ORDER — ONDANSETRON HCL 4 MG/2ML IJ SOLN: 4.0000 mg | Freq: Four times a day (QID) | INTRAMUSCULAR | Status: DC | PRN

## 2017-01-13 MED ORDER — INSULIN ASPART 100 UNIT/ML ~~LOC~~ SOLN
0.0000 [IU] | Freq: Three times a day (TID) | SUBCUTANEOUS | Status: DC
  Administered 2017-01-14 – 2017-01-15 (×4): 3 [IU] via SUBCUTANEOUS
  Administered 2017-01-15: 2 [IU] via SUBCUTANEOUS

## 2017-01-13 MED ORDER — ACETAMINOPHEN 500 MG PO TABS
1000.0000 mg | ORAL_TABLET | Freq: Four times a day (QID) | ORAL | Status: DC | PRN
  Administered 2017-01-14: 1000 mg via ORAL
  Filled 2017-01-13: qty 2

## 2017-01-13 MED ORDER — ROSUVASTATIN CALCIUM 10 MG PO TABS
10.0000 mg | ORAL_TABLET | Freq: Every day | ORAL | Status: DC
Start: 2017-01-14 — End: 2017-01-15
  Administered 2017-01-14 – 2017-01-15 (×2): 10 mg via ORAL
  Filled 2017-01-13 (×2): qty 1

## 2017-01-13 MED ORDER — DOXAZOSIN MESYLATE 2 MG PO TABS
2.0000 mg | ORAL_TABLET | Freq: Every day | ORAL | Status: DC
  Administered 2017-01-14: 2 mg via ORAL
  Filled 2017-01-13: qty 1

## 2017-01-13 MED ORDER — POLYVINYL ALCOHOL 1.4 % OP SOLN: 1.0000 [drp] | Freq: Every day | OPHTHALMIC | Status: DC | PRN

## 2017-01-13 MED ORDER — FINASTERIDE 5 MG PO TABS
5.0000 mg | ORAL_TABLET | Freq: Every day | ORAL | Status: DC
  Administered 2017-01-14 – 2017-01-15 (×2): 5 mg via ORAL
  Filled 2017-01-13 (×2): qty 1

## 2017-01-13 MED ORDER — INSULIN ASPART 100 UNIT/ML ~~LOC~~ SOLN: 0.0000 [IU] | Freq: Every day | SUBCUTANEOUS | Status: DC

## 2017-01-13 MED ORDER — SODIUM CHLORIDE 0.9% FLUSH: 3.0000 mL | INTRAVENOUS | Status: DC | PRN

## 2017-01-13 MED ORDER — SODIUM CHLORIDE 0.9 % IV SOLN: 250.0000 mL | INTRAVENOUS | Status: DC | PRN

## 2017-01-13 NOTE — Progress Notes (Signed)
Pt refuses CPAP, states he does not wear one at home.

## 2017-01-13 NOTE — ED Notes (Signed)
Attempted report, RN upstairs sts he just got report on other pts and has to lay eyes on them first. Sts he will call back.

## 2017-01-13 NOTE — ED Provider Notes (Signed)
[facility] DEPT Provider Note   CSN: [id number] Arrival date & time: 01/13/17  1526     History   Chief Complaint Chief Complaint  Patient presents with  . Near Syncope  . Weakness    HPI [name]. is a 83 y.o. male.  HPI   83 year old male presents today with syncopal episode.  Family is at bedside who report that patient was sitting in a chair when his eyes rolled into the back of his head, he fell onto the grass.  He continued to be altered at that time, this lasted several minutes.  They were able to get him back into his chair but still continued to have altered mental status.  EMS was called who noted patient to be hypotensive with a systolic blood pressure of 88.  250 mL's of normal saline was given which returned his blood pressure to normotensive range.  Patient denied any preceding symptoms including headache, neck pain, chest pain or shortness of breath.  Patient did note this morning he had some shortness of breath which is not abnormal for him.  Patient denies any preceding infectious type symptoms.  He has been eating and drinking appropriately.  Patient has noted to have slightly decreased systolic blood pressure readings over the last month, but no significant hypotensive episodes.   Past Medical History:  Diagnosis Date  . Acute CHF ([place]) 09/15/2012  . Asthma    pt denies this hx on 11/02/2013  . BPH (benign prostatic hyperplasia)   . CAD (coronary artery disease)    CABG '97, SVG DES 12/13  . Colonic polyp   . Exertional shortness of breath   . GERD (gastroesophageal reflux disease)   . Hearing loss   . History of pancreatitis    pt denies this hx on 11/02/2013  . Hyperlipidemia   . Hypertension   . Intrinsic asthma 06/18/2007   Qualifier: Diagnosis of  By: Dance CMA ([place]), Kim    . Ischemic cardiomyopathy 09/16/2012   EF 25%- improved to 45% 3/14  . Myocardial infarction 1997  . Obstructed, uropathy   . PVD (peripheral vascular disease) ([facility])      moderate bilat carotid disease  . Syncope and collapse 11/02/2013   "didn't pass out" (11/02/2013)  . Type II diabetes mellitus [facility])     Patient Active Problem List   Diagnosis Date Noted  . CAD (coronary artery disease) 01/13/2017  . Memory impairment 11/26/2016  . Essential hypertension 07/30/2016  . PCP NOTES >>>>>>>>> 11/28/2015  . Chronic combined systolic and diastolic CHF (congestive heart failure) ([place]) 07/08/2015  . Fatigue 06/01/2015  . Annual physical exam 08/04/2014  . Carotid artery disease ([place]) 12/25/2013  . Syncope 11/02/2013  . Left facial numbness 11/02/2013  . Sleep apnea- C-pap   . PVD- moderate carotid disease   . ICM, EF 20-25% by cath 09/16/12- improved to 45 % by Echo 3/14 09/16/2012  . S/P angioplasty with stent  09/16/2012  . Type 2 diabetes mellitus with circulatory disorder, with long-term current use of insulin ([place]) 09/15/2012  . BENIGN POSITIONAL VERTIGO 03/09/2010  . 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 Hyperlipidemia 06/18/2007  . Hx of CABG 06/18/2007  . ALLERGIC RHINITIS 06/18/2007  . Asthma, chronic 06/18/2007  . BENIGN PROSTATIC HYPERTROPHY 06/18/2007  . PANCREATITIS, HX OF 06/18/2007  . COLONIC POLYPS, HX OF 06/18/2007    Past Surgical History:  Procedure Laterality Date  . CATARACT EXTRACTION W/ INTRAOCULAR LENS  IMPLANT, BILATERAL Bilateral ~ 1970  .  CHOLECYSTECTOMY  2000's  . CORONARY ANGIOPLASTY WITH STENT PLACEMENT  Dec 2013   DES to SVG-CFX/PDA  . CORONARY ARTERY BYPASS GRAFT  1997   "CABG X 5"  . INGUINAL HERNIA REPAIR Bilateral ~ 1950  . LEFT HEART CATHETERIZATION WITH CORONARY/GRAFT ANGIOGRAM N/A 09/16/2012   Procedure: LEFT HEART CATHETERIZATION WITH Beatrix Fetters;  Surgeon: Lorretta Harp, MD;  Location: [facility] C[facility] LAB;  Service: Cardiovascular;  Laterality: N/A;  . NASAL SEPTUM SURGERY  2007  . RETINAL DETACHMENT SURGERY Left ?1995  . SHOULDER OPEN ROTATOR CUFF REPAIR Right        Home Medications     Prior to Admission medications   Medication Sig Start Date End Date Taking? Authorizing Provider  acetaminophen (TYLENOL) 500 MG tablet Take 1,000 mg by mouth every 6 (six) hours as needed for headache (pain).   Yes Historical Provider, MD  albuterol (PROAIR HFA) 108 (90 Base) MCG/ACT inhaler Inhale 2 puffs into the lungs every 6 (six) hours as needed for wheezing or shortness of breath. 07/02/16  Yes Colon Branch, MD  aspirin EC 81 MG EC tablet Take 2 tablets (162 mg total) by mouth daily. 09/18/12  Yes Luke K Kilroy, PA-C  azelastine (ASTELIN) 0.1 % nasal spray Place 2 sprays into both nostrils at bedtime. Use in each nostril as directed Patient taking differently: Place 2 sprays into both nostrils at bedtime as needed (congestion/drainage). Use in each nostril as directed 11/26/16  Yes Colon Branch, MD  clopidogrel (PLAVIX) 75 MG tablet TAKE 1 TABLET DAILY WITH   BREAKFAST 11/12/16  Yes Lorretta Harp, MD  doxazosin (CARDURA) 2 MG tablet Take 2 mg by mouth at bedtime.   Yes Historical Provider, MD  finasteride (PROSCAR) 5 MG tablet Take 5 mg by mouth daily.   Yes Historical Provider, MD  Fluticasone-Salmeterol (ADVAIR) 100-50 MCG/DOSE AEPB Inhale 1 puff into the lungs 2 (two) times daily as needed (shortness of breath). 04/09/16  Yes Colon Branch, MD  furosemide (LASIX) 40 MG tablet TAKE 1 TABLET (40 MG TOTAL) BY MOUTH DAILY. 05/30/16  Yes Lorretta Harp, MD  Grape Seed 100 MG CAPS Take 200 mg by mouth daily.    Yes Historical Provider, MD  Insulin Glargine (BASAGLAR KWIKPEN) 100 UNIT/ML SOPN Inject 0.17 mLs (17 Units total) into the skin at bedtime. 10/11/16  Yes Colon Branch, MD  isosorbide mononitrate (IMDUR) 30 MG 24 hr tablet Take 0.5 tablets (15 mg total) by mouth daily. 05/04/16  Yes Lorretta Harp, MD  lisinopril (ZESTRIL) 2.5 MG tablet Take 1 tablet (2.5 mg total) by mouth daily. 05/04/16  Yes Lorretta Harp, MD  metFORMIN (GLUCOPHAGE) 850 MG tablet Take 1.5 tablets (1,275 mg total) by mouth  2 (two) times daily with a meal. Patient taking differently: Take 850 mg by mouth 2 (two) times daily with a meal.  11/08/16  Yes Colon Branch, MD  metoprolol tartrate (LOPRESSOR) 25 MG tablet Take 0.5 tablets (12.5 mg total) by mouth 2 (two) times daily. 08/15/16  Yes Lorretta Harp, MD  pantoprazole (PROTONIX) 40 MG tablet TAKE 1 TABLET DAILY 03/15/16  Yes Lorretta Harp, MD  Propylene Glycol (SYSTANE BALANCE OP) Place 1 drop into both eyes daily as needed (dry eyes).    Yes Historical Provider, MD  rosuvastatin (CRESTOR) 10 MG tablet TAKE 1 TABLET DAILY 12/07/16  Yes Lorretta Harp, MD  acetaminophen (TYLENOL) 325 MG tablet Take 2 tablets (650 mg total) by mouth every  4 (four) hours as needed. Patient not taking: Reported on 01/13/2017 09/18/12   Erlene Quan, PA-C  Alcohol Swabs (ALCOHOL PREP) 70 % PADS Reported on 04/30/2016 02/23/14   Historical Provider, MD  Blood Glucose Monitoring Suppl (BLOOD GLUCOSE METER) kit Use as instructed 04/08/13   Neena Rhymes, MD  ezetimibe (ZETIA) 10 MG tablet Take 1 tablet (10 mg total) by mouth daily. 09/28/16 12/27/16  Lorretta Harp, MD  ezetimibe (ZETIA) 10 MG tablet Take 10 mg by mouth daily. 12/31/16   Historical Provider, MD  glucose blood test strip Use to test blood sugars twice daily 02/01/14   Neena Rhymes, MD  Insulin Pen Needle 32G X 4 MM MISC To use w/ Basaglar 11/29/15   Colon Branch, MD  Lancet Devices (ADJUSTABLE LANCING DEVICE) MISC Reported on 04/30/2016 02/23/14   Historical Provider, MD  mometasone (NASONEX) 50 MCG/ACT nasal spray Place 2 sprays into the nose daily. Patient not taking: Reported on 01/13/2017 11/08/16   Colon Branch, MD    Family History Family History  Problem Relation Age of Onset  . Prostate cancer Brother     dx ~ 61 y/o  . Colon cancer Neg Hx     Social History Social History  Substance Use Topics  . Smoking status: Former Smoker    Types: Cigars  . Smokeless tobacco: Never Used     Comment: 11/02/2013 "smoked  cigars when I was 18 or so; didn't smoke many"  . Alcohol use 1.2 oz/week    2 Glasses of wine per week     Allergies   Ativan [lorazepam]; Colesevelam; and Ezetimibe-simvastatin   Review of Systems Review of Systems  All other systems reviewed and are negative.    Physical Exam Updated Vital Signs BP 103/65   Pulse 75   Temp 97.4 F (36.3 C) (Oral)   Resp 20   Ht '5\' 8"'  (1.727 m)   Wt 80.7 kg   SpO2 97%   BMI 27.06 kg/m   Physical Exam  Constitutional: He is oriented to person, place, and time. He appears well-developed and well-nourished.  HENT:  Head: Normocephalic and atraumatic.  Eyes: Conjunctivae are normal. Pupils are equal, round, and reactive to light. Right eye exhibits no discharge. Left eye exhibits no discharge. No scleral icterus.  Neck: Normal range of motion. No JVD present. No tracheal deviation present.  Cardiovascular: Normal rate and regular rhythm.   Pulmonary/Chest: Effort normal and breath sounds normal. No stridor. No respiratory distress. He has no wheezes. He has no rales. He exhibits no tenderness.  Neurological: He is alert and oriented to person, place, and time. He displays normal reflexes. No cranial nerve deficit or sensory deficit. He exhibits normal muscle tone. Coordination normal.  Skin: Skin is warm.  Psychiatric: He has a normal mood and affect. His behavior is normal. Judgment and thought content normal.  Nursing note and vitals reviewed.    ED Treatments / Results  Labs (all labs ordered are listed, but only abnormal results are displayed) Labs Reviewed  BASIC METABOLIC PANEL - Abnormal; Notable for the following:       Result Value   Chloride 98 (*)    Glucose, Bld 240 (*)    GFR calc non Af Amer 51 (*)    GFR calc Af Amer 59 (*)    All other components within normal limits  CBC - Abnormal; Notable for the following:    RBC 4.01 (*)    Hemoglobin  12.3 (*)    HCT 36.5 (*)    All other components within normal limits    URINALYSIS, ROUTINE W REFLEX MICROSCOPIC  CBG MONITORING, ED    EKG  EKG Interpretation  Date/Time:  Sunday January 13 2017 15:36:43 EDT Ventricular Rate:  80 PR Interval:    QRS Duration: 104 QT Interval:  403 QTC Calculation: 465 R Axis:   -49 Text Interpretation:  Sinus rhythm Left anterior fascicular block Anterior infarct, old When compared to prior, only changes seen are in morphology of lead 2 and 3.  No STEMI Confirmed by Sherry Ruffing MD, [place] [phone]) on 01/13/2017 3:41:13 PM Also confirmed by Sherry Ruffing MD, [facility] [phone]), editor [place]  CCT, [place] ([id number])  on 01/13/2017 4:46:44 PM       Radiology Dg Chest 2 View  Result Date: 01/13/2017 CLINICAL DATA:  Syncope. EXAM: CHEST  2 VIEW COMPARISON:  11/28/2015 FINDINGS: There is no focal parenchymal opacity. There is no pleural effusion or pneumothorax. The heart and mediastinal contours are unremarkable. There is evidence of prior CABG. There is thoracic aortic atherosclerosis. The osseous structures are unremarkable. IMPRESSION: No active cardiopulmonary disease. Electronically Signed   By: Kathreen Devoid   On: 01/13/2017 16:23    Procedures Procedures (including critical care time)  Medications Ordered in ED Medications - No data to display   Initial Impression / Assessment and Plan / ED Course  I have reviewed the triage vital signs and the nursing notes.  Pertinent labs & imaging results that were available during my care of the patient were reviewed by me and considered in my medical decision making (see chart for details).      Final Clinical Impressions(s) / ED Diagnoses   Final diagnoses:  Syncope and collapse    Labs: BMP, CBC  Imaging: DG Chest 2 View  Consults: [facility]  Therapeutics: Normal saline  Discharge Meds:   Assessment/Plan: 83 year old male presents today with syncopal episode.  Patient denies any preceding symptoms, no neurological deficits after back to baseline.  Patient's  presentation is likely orthostatic related, but patient does have a significant past medical history of CAD, CHF.  No acute neurological deficits that indicate intracranial abnormality, no present pulmonary complaints.  Pt is  not a low risk candidate based on the [place] syncope rule.  [facility] service will be consulted for observation.      New Prescriptions New Prescriptions   No medications on file     Okey Regal, PA-C 01/13/17 [place], MD 01/14/17 1028

## 2017-01-13 NOTE — ED Triage Notes (Signed)
Received pt from home via EMS with c/o pt was sitting outside and became weak and flushed. Per wife pt eyes rolled to back of head and pt fell out of chair. Family denies + LOC. Upon arrival of EMS pt initial BP was 88/50, pt was pale and clammy. Pt given 250 ML bolus of NS, BP increased top 118/56.

## 2017-01-13 NOTE — H&P (Signed)
History and Physical    [name]. QAS:[id number] DOB: Nov 21, 1925 DOA: 01/13/2017  PCP: Kathlene November, MD   Patient coming from: Home  Chief Complaint: Syncope   HPI: Ryan Wint. is a 83 y.o. male with medical history significant for coronary artery disease status post CABG in 1997, carotid artery stenosis followed by vascular surgery, chronic combined systolic/diastolic CHF, and insulin-dependent diabetes mellitus who presents to the emergency department following a syncopal episode at home. Patient reports that he was in his usual state of health and having an uneventful day today leading up to the episode. He was sitting in a chair outside with family, enjoying himself and feeling well, when he was noted by family to have his "eyes rolled back into his head" as he slumped over and fell onto some grass. Patient was not responding at that time, but appeared to be breathing normally. EMS was called out and upon their arrival, the patient remained pale and clammy with a blood pressure of 88/50. He was given 250 mL normal saline en route to the hospital. Patient denies any recollection of the event and specifically denies any recent chest pain or palpitations, and denies headache, change in vision or hearing, or focal numbness or weakness. He had not been experiencing any significant nausea or lightheadedness. There has been no recent fevers or chills. He denies any alcohol use recently and there has been no nausea, vomiting, or diarrhea.  ED Course: Upon arrival to the ED, patient is found to be afebrile, saturating well on room air, blood pressure of 112/57, and normal heart rate or respirations. EKG features a sinus rhythm with left anterior fascicular block and does not appear significantly changed from prior. Chest x-ray is negative for acute cardiopulmonary disease. Chemistry panels notable for a serum glucose of 240 and CBC features a mild normocytic anemia with hemoglobin 12.3. Patient  remained hemodynamically stable in the ED and in no apparent respiratory distress. There was no arrhythmia noted on the monitor. He will be observed in the telemetry unit for ongoing evaluation and management of a syncopal episode with significant cardiac risk factors.  Review of Systems:  All other systems reviewed and apart from HPI, are negative.  Past Medical History:  Diagnosis Date  . Acute CHF ([address]) 09/15/2012  . Asthma    pt denies this hx on 11/02/2013  . BPH (benign prostatic hyperplasia)   . CAD (coronary artery disease)    CABG '97, SVG DES 12/13  . Colonic polyp   . Exertional shortness of breath   . GERD (gastroesophageal reflux disease)   . Hearing loss   . History of pancreatitis    pt denies this hx on 11/02/2013  . Hyperlipidemia   . Hypertension   . 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 Ischemic cardiomyopathy 09/16/2012   EF 25%- improved to 45% 3/14  . Myocardial infarction 1997  . Obstructed, uropathy   . PVD (peripheral vascular disease) ([facility])    moderate bilat carotid disease  . Syncope and collapse 11/02/2013   "didn't pass out" (11/02/2013)  . Type II diabetes mellitus ([place])     Past Surgical History:  Procedure Laterality Date  . CATARACT EXTRACTION W/ INTRAOCULAR LENS  IMPLANT, BILATERAL Bilateral ~ 1970  . CHOLECYSTECTOMY  2000's  . CORONARY ANGIOPLASTY WITH STENT PLACEMENT  Dec 2013   DES to SVG-CFX/PDA  . CORONARY ARTERY BYPASS GRAFT  1997   "  CABG X 5"  . INGUINAL HERNIA REPAIR Bilateral ~ 1950  . LEFT HEART CATHETERIZATION WITH CORONARY/GRAFT ANGIOGRAM N/A 09/16/2012   Procedure: LEFT HEART CATHETERIZATION WITH Beatrix Fetters;  Surgeon: Lorretta Harp, MD;  Location: [facility] C[facility] LAB;  Service: Cardiovascular;  Laterality: N/A;  . NASAL SEPTUM SURGERY  2007  . RETINAL DETACHMENT SURGERY Left ?1995  . SHOULDER OPEN ROTATOR CUFF REPAIR Right      reports that he has quit smoking. His smoking  use included Cigars. He has never used smokeless tobacco. He reports that he drinks about 1.2 oz of alcohol per week . He reports that he does not use drugs.  Allergies  Allergen Reactions  . Ativan [Lorazepam] Other (See Comments)    Made him crazy  . Colesevelam Other (See Comments)    Unknown reaction  . Ezetimibe-Simvastatin Other (See Comments)    Vytorin caused pancreatitis    Family History  Problem Relation Age of Onset  . Prostate cancer Brother     dx ~ 104 y/o  . Colon cancer Neg Hx      Prior to Admission medications   Medication Sig Start Date End Date Taking? Authorizing Provider  acetaminophen (TYLENOL) 500 MG tablet Take 1,000 mg by mouth every 6 (six) hours as needed for headache (pain).   Yes Historical Provider, MD  albuterol (PROAIR HFA) 108 (90 Base) MCG/ACT inhaler Inhale 2 puffs into the lungs every 6 (six) hours as needed for wheezing or shortness of breath. 07/02/16  Yes Colon Branch, MD  aspirin EC 81 MG EC tablet Take 2 tablets (162 mg total) by mouth daily. 09/18/12  Yes Luke K Kilroy, PA-C  azelastine (ASTELIN) 0.1 % nasal spray Place 2 sprays into both nostrils at bedtime. Use in each nostril as directed Patient taking differently: Place 2 sprays into both nostrils at bedtime as needed (congestion/drainage). Use in each nostril as directed 11/26/16  Yes Colon Branch, MD  clopidogrel (PLAVIX) 75 MG tablet TAKE 1 TABLET DAILY WITH   BREAKFAST 11/12/16  Yes Lorretta Harp, MD  doxazosin (CARDURA) 2 MG tablet Take 2 mg by mouth at bedtime.   Yes Historical Provider, MD  finasteride (PROSCAR) 5 MG tablet Take 5 mg by mouth daily.   Yes Historical Provider, MD  Fluticasone-Salmeterol (ADVAIR) 100-50 MCG/DOSE AEPB Inhale 1 puff into the lungs 2 (two) times daily as needed (shortness of breath). 04/09/16  Yes Colon Branch, MD  furosemide (LASIX) 40 MG tablet TAKE 1 TABLET (40 MG TOTAL) BY MOUTH DAILY. 05/30/16  Yes Lorretta Harp, MD  Grape Seed 100 MG CAPS Take 200 mg by  mouth daily.    Yes Historical Provider, MD  Insulin Glargine (BASAGLAR KWIKPEN) 100 UNIT/ML SOPN Inject 0.17 mLs (17 Units total) into the skin at bedtime. 10/11/16  Yes Colon Branch, MD  isosorbide mononitrate (IMDUR) 30 MG 24 hr tablet Take 0.5 tablets (15 mg total) by mouth daily. 05/04/16  Yes Lorretta Harp, MD  lisinopril (ZESTRIL) 2.5 MG tablet Take 1 tablet (2.5 mg total) by mouth daily. 05/04/16  Yes Lorretta Harp, MD  metFORMIN (GLUCOPHAGE) 850 MG tablet Take 1.5 tablets (1,275 mg total) by mouth 2 (two) times daily with a meal. Patient taking differently: Take 850 mg by mouth 2 (two) times daily with a meal.  11/08/16  Yes Colon Branch, MD  metoprolol tartrate (LOPRESSOR) 25 MG tablet Take 0.5 tablets (12.5 mg total) by mouth 2 (two) times daily. 08/15/16  Yes [name], MD  pantoprazole (PROTONIX) 40 MG tablet TAKE 1 TABLET DAILY 03/15/16  Yes [name], MD  Propylene Glycol (SYSTANE BALANCE OP) Place 1 drop into both eyes daily as needed (dry eyes).    Yes Historical Provider, MD  rosuvastatin (CRESTOR) 10 MG tablet TAKE 1 TABLET DAILY 12/07/16  Yes [name], MD  Alcohol Swabs (ALCOHOL PREP) 70 % PADS Reported on 04/30/2016 02/23/14   Historical Provider, MD  Blood Glucose Monitoring Suppl (BLOOD GLUCOSE METER) kit Use as instructed 04/08/13   Jacques Navy, MD  ezetimibe (ZETIA) 10 MG tablet Take 1 tablet (10 mg total) by mouth daily. 09/28/16 12/27/16  [name], MD  ezetimibe (ZETIA) 10 MG tablet Take 10 mg by mouth daily. 12/31/16   Historical Provider, MD  glucose blood test strip Use to test blood sugars twice daily 02/01/14   Jacques Navy, MD  Insulin Pen Needle 32G X 4 MM MISC To use w/ Basaglar 11/29/15   Wanda Plump, MD  Lancet Devices (ADJUSTABLE LANCING DEVICE) MISC Reported on 04/30/2016 02/23/14   Historical Provider, MD    Physical Exam: Vitals:   01/13/17 1815 01/13/17 1830 01/13/17 1900 01/13/17 1930  BP:  (!) 108/54 (!) 111/54 106/75  Pulse:  75 73 73 76  Resp: 20 (!) 21 (!) 22 15  Temp:      TempSrc:      SpO2: 97% 98% 96% 95%  Weight:      Height:          Constitutional: NAD, calm, comfortable Eyes: PERTLA, lids and conjunctivae normal ENMT: Mucous membranes are moist. Posterior pharynx clear of any exudate or lesions.   Neck: normal, supple, no masses, no thyromegaly Respiratory: clear to auscultation bilaterally, no wheezing, no crackles. Normal respiratory effort.  Cardiovascular: S1 & S2 heard, regular rate and rhythm, no significant murmur. No extremity edema. No significant JVD. Abdomen: No distension, no tenderness, no masses palpated. Bowel sounds normal.  Musculoskeletal: no clubbing / cyanosis. No joint deformity upper and lower extremities.   Skin: no significant rashes, lesions, ulcers. Warm, dry, well-perfused. Neurologic: CN 2-12 grossly intact. Sensation intact, DTR normal. Strength 5/5 in all 4 limbs.  Psychiatric: Normal judgment and insight. Alert and oriented x 3. Normal mood and affect.     Labs on Admission: I have personally reviewed following labs and imaging studies  CBC:  Recent Labs Lab 01/13/17 1620  WBC 10.5  HGB 12.3*  HCT 36.5*  MCV 91.0  PLT 205   Basic Metabolic Panel:  Recent Labs Lab 01/13/17 1620  NA 135  K 4.0  CL 98*  CO2 25  GLUCOSE 240*  BUN 18  CREATININE 1.22  CALCIUM 9.1   GFR: Estimated Creatinine Clearance: 40.5 mL/min (by C-G formula based on SCr of 1.22 mg/dL). Liver Function Tests: No results for input(s): AST, ALT, ALKPHOS, BILITOT, PROT, ALBUMIN in the last 168 hours. No results for input(s): LIPASE, AMYLASE in the last 168 hours. No results for input(s): AMMONIA in the last 168 hours. Coagulation Profile: No results for input(s): INR, PROTIME in the last 168 hours. Cardiac Enzymes: No results for input(s): CKTOTAL, CKMB, CKMBINDEX, TROPONINI in the last 168 hours. BNP (last 3 results) No results for input(s): PROBNP in the last 8760  hours. HbA1C: No results for input(s): HGBA1C in the last 72 hours. CBG: No results for input(s): GLUCAP in the last 168 hours. Lipid Profile: No results for input(s): CHOL, HDL, LDLCALC,  TRIG, CHOLHDL, LDLDIRECT in the last 72 hours. Thyroid Function Tests: No results for input(s): TSH, T4TOTAL, FREET4, T3FREE, THYROIDAB in the last 72 hours. Anemia Panel: No results for input(s): VITAMINB12, FOLATE, FERRITIN, TIBC, IRON, RETICCTPCT in the last 72 hours. Urine analysis:    Component Value Date/Time   COLORURINE YELLOW 11/28/2015 1144   APPEARANCEUR CLEAR 11/28/2015 1144   LABSPEC 1.015 11/28/2015 1144   PHURINE 5.0 11/28/2015 1144   GLUCOSEU NEGATIVE 11/28/2015 1144   HGBUR SMALL (A) 11/28/2015 1144   BILIRUBINUR NEGATIVE 11/28/2015 1144   KETONESUR NEGATIVE 11/28/2015 1144   PROTEINUR NEGATIVE 11/02/2013 1536   UROBILINOGEN 0.2 11/28/2015 1144   NITRITE NEGATIVE 11/28/2015 1144   LEUKOCYTESUR NEGATIVE 11/28/2015 1144   Sepsis Labs: _0 (procalcitonin:4,lacticidven:4) )No results found for this or any previous visit (from the past 240 hour(s)).   Radiological Exams on Admission: Dg Chest 2 View  Result Date: 01/13/2017 CLINICAL DATA:  Syncope. EXAM: CHEST  2 VIEW COMPARISON:  11/28/2015 FINDINGS: There is no focal parenchymal opacity. There is no pleural effusion or pneumothorax. The heart and mediastinal contours are unremarkable. There is evidence of prior CABG. There is thoracic aortic atherosclerosis. The osseous structures are unremarkable. IMPRESSION: No active cardiopulmonary disease. Electronically Signed   By: Kathreen Devoid   On: 01/13/2017 16:23    EKG: Independently reviewed. Sinus rhythm, LAFB, does not appear significantly changed from prior.  Assessment/Plan  1. Syncope  - Pt presents following syncopal episode that occurred while seated and without prodrome  - On EMS arrival, he was reportedly pale, clammy, and with BP 88/50  - EKG appears unchanged,  orthostatic vitals are negative, and basic bloodwork unremarkable  - No evidence for acute VTE or CVA  - Likely neurally-mediated, but not entirely clear, and with patient's extensive cardiac hx, he will be observed for further eval with cardiac monitoring and echocardiogram    2. Chronic combined systolic/diastolic CHF  - Pt appears euvolemic on admission  - TTE (12/18/12) with EF 73-41%, grade 2 diastolic dysfunction, severe HK of apical myocardium, severe LAE  - He is managed at home with Lasix and metoprolol  - Follow I/O's and daily wts, SLIV, continue metoprolol as tolerated, hold Lasix in light of bumped SCr   3. Carotid artery stenosis  - Pt has known CAS bilaterally and is followed by Dr. Gwenlyn Found  - Ultrasound in August '17 with stable 60-79% stenosis on right, and 40-59% on left  - He is due for follow-up with repeat carotid ultrasound in August '18 - No deficit to suggest a TIA or CVA and the syncopal episode unlikely related to this, but advised pt to let Dr. Gwenlyn Found know about the event    4. Coronary artery disease  - No anginal complaints  - Status-post CABG in 1990's  - Continue ASA, Plavix, Crestor, metoprolol   5. Hypertension  - BP at goal; had been 88/50 for EMS initially - Continue metoprolol as tolerated    6. Insulin-dependent DM  - A1c was 7.5% in February 2018 - Managed at home with metformin and Lantus 17 units qHS  - Check CBG with meals and qHS - Continue Lantus 17 units qHS with moderate-intensity Novolog correctional    DVT prophylaxis: sq Lovenox  Code Status: DNR  Family Communication: Wife and daughter updated at bedside Disposition Plan: Observe on telemetry Consults called: None Admission status: Observation    Vianne Bulls, MD [facility] Pager [phone]  If 7PM-7AM, please contact night-coverage [email or web address] Password [email or web address]  01/13/2017, 8:13 PM

## 2017-01-14 DIAGNOSIS — I2510 Atherosclerotic heart disease of native coronary artery without angina pectoris: Secondary | ICD-10-CM | POA: Diagnosis not present

## 2017-01-14 DIAGNOSIS — R55 Syncope and collapse: Secondary | ICD-10-CM | POA: Diagnosis not present

## 2017-01-14 DIAGNOSIS — I10 Essential (primary) hypertension: Secondary | ICD-10-CM | POA: Diagnosis not present

## 2017-01-14 LAB — GLUCOSE, CAPILLARY
GLUCOSE-CAPILLARY: 155 mg/dL — AB (ref 65–99)
Glucose-Capillary: 155 mg/dL — ABNORMAL HIGH (ref 65–99)
Glucose-Capillary: 187 mg/dL — ABNORMAL HIGH (ref 65–99)
Glucose-Capillary: 117 mg/dL — ABNORMAL HIGH (ref 65–99)

## 2017-01-14 MED ORDER — ZOLPIDEM TARTRATE 5 MG PO TABS
2.5000 mg | ORAL_TABLET | Freq: Every evening | ORAL | Status: DC | PRN
  Administered 2017-01-15: 2.5 mg via ORAL
  Filled 2017-01-14: qty 1

## 2017-01-14 MED ORDER — INSULIN GLARGINE 100 UNIT/ML ~~LOC~~ SOLN
8.0000 [IU] | Freq: Every day | SUBCUTANEOUS | Status: DC
  Administered 2017-01-14: 8 [IU] via SUBCUTANEOUS
  Filled 2017-01-14: qty 0.08

## 2017-01-14 MED ORDER — GLUCERNA SHAKE PO LIQD
237.0000 mL | Freq: Two times a day (BID) | ORAL | Status: DC
Start: 2017-01-14 — End: 2017-01-15
  Administered 2017-01-14 – 2017-01-15 (×2): 237 mL via ORAL

## 2017-01-14 NOTE — Progress Notes (Signed)
Pt refuses CPAP 

## 2017-01-14 NOTE — Progress Notes (Signed)
Initial Nutrition Assessment  DOCUMENTATION CODES:   Not applicable  INTERVENTION:    Glucerna Shake po BID, each supplement provides 220 kcal and 10 grams of protein  NUTRITION DIAGNOSIS:   Inadequate oral intake related to poor appetite as evidenced by per patient/family report.  GOAL:   Patient will meet greater than or equal to 90% of their needs  MONITOR:   PO intake, Supplement acceptance  REASON FOR ASSESSMENT:   Malnutrition Screening Tool    ASSESSMENT:   83 y.o. male with medical history significant for coronary artery disease status post CABG in 1997, carotid artery stenosis followed by vascular surgery, chronic combined systolic/diastolic CHF, and insulin-dependent diabetes mellitus who presents to the emergency department following a syncopal episode at home.  Patient reports that he has had a poor appetite for several months and thinks he has lost ~10-20 lbs over the past year. Wife reports belt size decreased from 42 to 36. He has lost ~3.5% of usual weight in the past 1.5 months. He typically eats 3 meals per day; from dietary recall, protein intake seems low. He agreed to try a PO supplement to maximize protein and calorie intake. Nutrition-Focused physical exam completed. Findings are no fat depletion, mild muscle depletion, and no edema.   Diet Order:  Diet Heart Room service appropriate? Yes; Fluid consistency: Thin; Fluid restriction: 1500 mL Fluid  Skin:  Reviewed, no issues  Last BM:  4/2  Height:   Ht Readings from Last 1 Encounters:  01/13/17 5\' 8"  (1.727 m)    Weight:   Wt Readings from Last 1 Encounters:  01/14/17 168 lb 4.8 oz (76.3 kg)    Ideal Body Weight:  70 kg  BMI:  Body mass index is 25.59 kg/m.  Estimated Nutritional Needs:   Kcal:  1800  Protein:  80-90 gm  Fluid:  1.8 L  EDUCATION NEEDS:   No education needs identified at this time  Joaquin Courts, RD, LDN, CNSC Pager [phone] After Hours Pager [phone]

## 2017-01-14 NOTE — Evaluation (Signed)
Physical Therapy Evaluation Patient Details Name: [name]. MRN: [id number] DOB: [birth year] Today's Date: 01/14/2017   History of Present Illness  Pt adm with syncope. PMH - CABG, CHF, DM  Clinical Impression  Pt mobilizing adequately to return home with family. Will follow acutely but does not need PT at DC.    Follow Up Recommendations No PT follow up    Equipment Recommendations  None recommended by PT    Recommendations for Other Services       Precautions / Restrictions Precautions Precautions: Fall Restrictions Weight Bearing Restrictions: No      Mobility  Bed Mobility Overal bed mobility: Modified Independent                Transfers Overall transfer level: Modified independent                  Ambulation/Gait Ambulation/Gait assistance: Min guard Ambulation Distance (Feet): 450 Feet Assistive device: 1 person hand held assist;None Gait Pattern/deviations: Step-through pattern;Drifts right/left     General Gait Details: Pt with slightly unsteady gait without overt loss of balance.   Stairs            Wheelchair Mobility    Modified Rankin (Stroke Patients Only)       Balance Overall balance assessment: Needs assistance Sitting-balance support: No upper extremity supported;Feet supported Sitting balance-Leahy Scale: Normal     Standing balance support: No upper extremity supported;During functional activity Standing balance-Leahy Scale: Fair                               Pertinent Vitals/Pain Pain Assessment: No/denies pain    Home Living Family/patient expects to be discharged to:: Private residence Living Arrangements: Spouse/significant other Available Help at Discharge: Family;Available 24 hours/day Type of Home: House Home Access: Stairs to enter   [employer] of Steps: 4 Home Layout: Two level;Able to live on main level with bedroom/bathroom Home Equipment: [place] Mor - single point       Prior Function Level of Independence: Independent               Hand Dominance   Dominant Hand: Right    Extremity/Trunk Assessment   Upper Extremity Assessment Upper Extremity Assessment: Overall WFL for tasks assessed    Lower Extremity Assessment Lower Extremity Assessment: Overall WFL for tasks assessed       Communication   Communication: HOH  Cognition Arousal/Alertness: Awake/alert Behavior During Therapy: WFL for tasks assessed/performed Overall Cognitive Status: Within Functional Limits for tasks assessed                                        General Comments      Exercises     Assessment/Plan    PT Assessment Patient needs continued PT services  PT Problem List Decreased balance;Decreased mobility       PT Treatment Interventions DME instruction;Gait training;Functional mobility training;Therapeutic activities;Therapeutic exercise;Balance training;Patient/family education    PT Goals (Current goals can be found in the Care Plan section)  Acute Rehab PT Goals Patient Stated Goal: return home PT Goal Formulation: With patient Time For Goal Achievement: 01/18/17 Potential to Achieve Goals: Good    Frequency Min 3X/week   Barriers to discharge        Co-evaluation  End of Session   Activity Tolerance: Patient tolerated treatment well Patient left: in bed;with family/visitor present (sitting EOB)   PT Visit Diagnosis: Unsteadiness on feet (R26.81)    Time: 0623-7628 PT Time Calculation (min) (ACUTE ONLY): 13 min   Charges:   PT Evaluation $PT Eval Low Complexity: 1 Procedure     PT G Codes:   PT G-Codes **NOT FOR INPATIENT CLASS** Functional Assessment Tool Used: AM-PAC 6 Clicks Basic Mobility Functional Limitation: Mobility: Walking and moving around Mobility: Walking and Moving Around Current Status (B1517): At least 20 percent but less than 40 percent impaired, limited or  restricted Mobility: Walking and Moving Around Goal Status [phone]): At least 1 percent but less than 20 percent impaired, limited or restricted    [facility] PT [phone]   Angelina Ok [facility] 01/14/2017, 3:41 PM

## 2017-01-14 NOTE — Care Management Obs Status (Signed)
MEDICARE OBSERVATION STATUS NOTIFICATION   Patient Details  Name: [name]. MRN: [id number] Date of Birth: [birth year]   Medicare Observation Status Notification Given:       Durenda Guthrie, RN 01/14/2017, 4:19 PM

## 2017-01-14 NOTE — Progress Notes (Signed)
Inpatient Diabetes Program Recommendations  AACE/ADA: New Consensus Statement on Inpatient Glycemic Control (2015)  Target Ranges:  Prepandial:   less than 140 mg/dL      Peak postprandial:   less than 180 mg/dL (1-2 hours)      Critically ill patients:  140 - 180 mg/dL   Lab Results  Component Value Date   GLUCAP 155 (H) 01/14/2017   HGBA1C 7.5 (H) 11/26/2016    Review of Glycemic Control  Diabetes history: DM2  Outpatient Diabetes medications: Metformin 850 mg BID, Basaglar 17 units QHS  Current orders for Inpatient glycemic control: moderate correction scale Novolog 0-15 units TIDAC and 0-5 units QHS, Lantus 17 units QHS  Inpatient Diabetes Program Recommendations:   Insulin - Basal: Please consider decreasing basal dose to Lantus 8 units daily HS.  Thank you,  Kristine Linea, RN, MSN Diabetes Coordinator Inpatient Diabetes Program [phone] (Team Pager)

## 2017-01-14 NOTE — Progress Notes (Addendum)
Patient ID: Ryan Jorge., male   DOB: 02-17-1926, 83 y.o.   MRN: [id number]  PROGRESS NOTE    Ryan Baas.  [id number] DOB: 08/02/1926 DOA: 01/13/2017  PCP: Willow Ora, MD   Brief Narrative:  83 y.o. male with medical history significant for coronary artery disease status post CABG in 1997, carotid artery stenosis followed by vascular surgery, chronic combined systolic/diastolic CHF, and insulin-dependent diabetes mellitus who presented to ED following a syncopal episode at home. He was sitting in a chair outside with family, his family noted patient "eyes rolled back into his head" as he slumped over and fell onto the grass. Patient was not responding at that time, but appeared to be breathing normally. EMS was called out and upon their arrival, the patient remained pale and clammy with a blood pressure of 88/50. He was given 250 mL normal saline en route to the hospital.   In ED, patient was afebrile, saturating well on room air, blood pressure was 112/57, normal heart rate and respirations. EKG showed a sinus rhythm with left anterior fascicular block and did not appear significantly changed from prior. Chest x-ray was negative for acute cardiopulmonary disease. Blood work was notable for a serum glucose of 240 and hemoglobin 12.3. Patient remained hemodynamically stable in the ED and in no apparent respiratory distress. There was no arrhythmia noted on the monitor.   Assessment & Plan:   Principal Problem:   Syncope - Unclear etiology, possibly vasovagal - Pt feels better this am - ECHO is pending - PT eval pending   Active Problems:   Asthma, chronic - Stable resp status     Type 2 diabetes mellitus with circulatory disorder, with long-term current use of insulin (HCC) - Continue current insulin regimen    Carotid artery disease (HCC) - Continue aspirin and plavix     Essential hypertension - Continue isosorbide, lisinopril and metoprolol    Dyslipidemia associated  with type 2 DM - Continue zetia and Crestor    DVT prophylaxis: Lovenox subQ Code Status: DNR/DNI Family Communication: family at the bedside this am Disposition Plan: home in am, awaiting ECHO results    Consultants:   None   Procedures:   ECHO pending   Antimicrobials:   None    Subjective: No overnight events.  Objective: Vitals:   01/14/17 0332 01/14/17 0514 01/14/17 1013 01/14/17 1245  BP:  (!) 160/75 136/62 (!) 119/55  Pulse:  92 93 74  Resp:  18  18  Temp:  97.5 F (36.4 C)  98.1 F (36.7 C)  TempSrc:  Oral  Oral  SpO2:   96% 98%  Weight: 76.3 kg (168 lb 4.8 oz)     Height:        Intake/Output Summary (Last 24 hours) at 01/14/17 1757 Last data filed at 01/14/17 1434  Gross per 24 hour  Intake              697 ml  Output              179 ml  Net              518 ml   Filed Weights   01/13/17 1536 01/13/17 2125 01/14/17 0332  Weight: 80.7 kg (178 lb) 76.5 kg (168 lb 9.6 oz) 76.3 kg (168 lb 4.8 oz)    Examination:  General exam: Appears calm and comfortable  Respiratory system: Clear to auscultation. Respiratory effort normal. Cardiovascular system: S1 & S2 heard, Rate  controlled  Gastrointestinal system: Abdomen is nondistended, soft and nontender. No organomegaly or masses felt. Normal bowel sounds heard. Central nervous system: Alert and oriented. No focal neurological deficits. Extremities: Symmetric 5 x 5 power. Skin: No rashes, lesions or ulcers Psychiatry: Judgement and insight appear normal. Mood & affect appropriate.   Data Reviewed: I have personally reviewed following labs and imaging studies  CBC:  Recent Labs Lab 01/13/17 1620  WBC 10.5  HGB 12.3*  HCT 36.5*  MCV 91.0  PLT 205   Basic Metabolic Panel:  Recent Labs Lab 01/13/17 1620  NA 135  K 4.0  CL 98*  CO2 25  GLUCOSE 240*  BUN 18  CREATININE 1.22  CALCIUM 9.1   GFR: Estimated Creatinine Clearance: 40.5 mL/min (by C-G formula based on SCr of 1.22  mg/dL). Liver Function Tests: No results for input(s): AST, ALT, ALKPHOS, BILITOT, PROT, ALBUMIN in the last 168 hours. No results for input(s): LIPASE, AMYLASE in the last 168 hours. No results for input(s): AMMONIA in the last 168 hours. Coagulation Profile: No results for input(s): INR, PROTIME in the last 168 hours. Cardiac Enzymes: No results for input(s): CKTOTAL, CKMB, CKMBINDEX, TROPONINI in the last 168 hours. BNP (last 3 results) No results for input(s): PROBNP in the last 8760 hours. HbA1C: No results for input(s): HGBA1C in the last 72 hours. CBG:  Recent Labs Lab 01/13/17 2141 01/14/17 0640 01/14/17 1206 01/14/17 1618  GLUCAP 173* 155* 155* 187*   Lipid Profile: No results for input(s): CHOL, HDL, LDLCALC, TRIG, CHOLHDL, LDLDIRECT in the last 72 hours. Thyroid Function Tests: No results for input(s): TSH, T4TOTAL, FREET4, T3FREE, THYROIDAB in the last 72 hours. Anemia Panel: No results for input(s): VITAMINB12, FOLATE, FERRITIN, TIBC, IRON, RETICCTPCT in the last 72 hours. Urine analysis:    Component Value Date/Time   COLORURINE YELLOW 01/13/2017 2125   APPEARANCEUR HAZY (A) 01/13/2017 2125   LABSPEC 1.021 01/13/2017 2125   PHURINE 5.0 01/13/2017 2125   GLUCOSEU 150 (A) 01/13/2017 2125   GLUCOSEU NEGATIVE 11/28/2015 1144   HGBUR NEGATIVE 01/13/2017 2125   BILIRUBINUR NEGATIVE 01/13/2017 2125   KETONESUR NEGATIVE 01/13/2017 2125   PROTEINUR NEGATIVE 01/13/2017 2125   UROBILINOGEN 0.2 11/28/2015 1144   NITRITE NEGATIVE 01/13/2017 2125   LEUKOCYTESUR NEGATIVE 01/13/2017 2125   Sepsis Labs: @LABRCNTIP (procalcitonin:4,lacticidven:4)   )No results found for this or any previous visit (from the past 240 hour(s)).    Radiology Studies: Dg Chest 2 View  Result Date: 4/1/201 No active cardiopulmonary disease.   Scheduled Meds: . aspirin EC  162 mg Oral Daily  . clopidogrel  75 mg Oral Q breakfast  . doxazosin  2 mg Oral QHS  . enoxaparin (LOVENOX)  injection  40 mg Subcutaneous Q24H  . ezetimibe  10 mg Oral Daily  . feeding supplement (GLUCERNA SHAKE)  237 mL Oral BID BM  . finasteride  5 mg Oral Daily  . insulin aspart  0-15 Units Subcutaneous TID WC  . insulin aspart  0-5 Units Subcutaneous QHS  . insulin glargine  8 Units Subcutaneous QHS  . isosorbide mononit  15 mg Oral Daily  . lisinopril  2.5 mg Oral Daily  . metoprolol tartrate  12.5 mg Oral BID  . pantoprazole  40 mg Oral Daily  . rosuvastatin  10 mg Oral Daily   Continuous Infusions:   LOS: 0 days    Time spent: 15 minutes  Greater than 50% of the time spent on counseling and coordinating the care.  Manson Passey, MD [facility] Pager [phone]  If 7PM-7AM, please contact night-coverage [email or web address] Password [facility] 01/14/2017, 5:57 PM

## 2017-01-15 ENCOUNTER — Observation Stay (HOSPITAL_BASED_OUTPATIENT_CLINIC_OR_DEPARTMENT_OTHER): Payer: Medicare Other

## 2017-01-15 ENCOUNTER — Telehealth: Payer: Self-pay | Admitting: Internal Medicine

## 2017-01-15 DIAGNOSIS — R55 Syncope and collapse: Secondary | ICD-10-CM

## 2017-01-15 DIAGNOSIS — I10 Essential (primary) hypertension: Secondary | ICD-10-CM | POA: Diagnosis not present

## 2017-01-15 DIAGNOSIS — Z136 Encounter for screening for cardiovascular disorders: Secondary | ICD-10-CM

## 2017-01-15 DIAGNOSIS — I2510 Atherosclerotic heart disease of native coronary artery without angina pectoris: Secondary | ICD-10-CM | POA: Diagnosis not present

## 2017-01-15 LAB — ECHOCARDIOGRAM COMPLETE
HEIGHTINCHES: 68 in
WEIGHTICAEL: 2678.4 [oz_av]

## 2017-01-15 LAB — HEMOGLOBIN A1C
HEMOGLOBIN A1C: 7.4 % — AB (ref 4.8–5.6)
MEAN PLASMA GLUCOSE: 166 mg/dL

## 2017-01-15 LAB — GLUCOSE, CAPILLARY
GLUCOSE-CAPILLARY: 130 mg/dL — AB (ref 65–99)
GLUCOSE-CAPILLARY: 180 mg/dL — AB (ref 65–99)
Glucose-Capillary: 141 mg/dL — ABNORMAL HIGH (ref 65–99)

## 2017-01-15 NOTE — Progress Notes (Signed)
Pt kept wandering whole night, tab Ambien could not help him to sleep, he just dozed off little bit,vitals stable, no any complain of chest pain and SOB so far, will continue to monitor the patient.

## 2017-01-15 NOTE — Progress Notes (Signed)
Pt has orders to be discharged. Discharge instructions given and pt has no additional questions at this time. Medication regimen reviewed and pt educated. Pt verbalized understanding and has no additional questions. Telemetry box removed. IV removed and site in good condition. Pt stable and waiting for transportation.   Timi Reeser RN 

## 2017-01-15 NOTE — Progress Notes (Signed)
Taught patient what an ECHO is and what it measures

## 2017-01-15 NOTE — Discharge Summary (Signed)
Physician Discharge Summary  [name]. QRF:[id number] DOB: 21-May-1926 DOA: 01/13/2017  PCP: Kathlene November, MD  Admit date: 01/13/2017 Discharge date: 01/15/2017  Recommendations for Outpatient Follow-up:  1. No changes in meds on discharge   Discharge Diagnoses:  Principal Problem:   Syncope Active Problems:   Asthma, chronic   Type 2 diabetes mellitus with circulatory disorder, with long-term current use of insulin (HCC)   Sleep apnea- C-pap   Carotid artery disease (HCC)   Chronic combined systolic and diastolic CHF (congestive heart failure) (HCC)   Essential hypertension   CAD (coronary artery disease)    Discharge Condition: stable   Diet recommendation: as tolerated   History of present illness:  83 y.o.malewith medical history significant for coronary artery disease status post CABG in 1997, carotid artery stenosis followed by vascular surgery, chronic combined systolic/diastolic CHF, and insulin-dependent diabetes mellitus who presented to ED following a syncopal episode at home. He was sitting in a chair outside with family, his family noted patient "eyes rolled back into his head" as he slumped over and fell onto the grass. Patient was not responding at that time, but appeared to be breathing normally. EMS was called out and upon their arrival, the patient remained pale and clammy with a blood pressure of 88/50. He was given 250 mL normal saline en route to the hospital.   In ED, patient was afebrile, saturating well on room air, blood pressure was 112/57, normal heart rate and respirations. EKG showed a sinus rhythm with left anterior fascicular block and did not appear significantly changed from prior. Chest x-ray was negative for acute cardiopulmonary disease. Blood work was notable for a serum glucose of 240 and hemoglobin 12.3. Patient remained hemodynamically stable in the ED and in no apparent respiratory distress. There was no arrhythmia noted on the  monitor.   Hospital Course:  Principal Problem:   Syncope - Unclear etiology, possibly vasovagal - Pt feels better - ECHO - EF 35-40%; Severe hypokinesis of the  mid-apicalanteroseptal, anterior, and apical myocardium; consistent with infarction in the distribution of the left anterior descending coronary artery; unchanged from the study of 12/19/2012. Akinesis of the basalinferior myocardium; consistent with infarction in the distribution of the right coronary artery; unchanged from the study of 12/19/2012. There was an increased relative contribution of atrial contraction to ventricular filling. Doppler parameters are consistent with abnormal left ventricular relaxation (grade 1 diastolic dysfunction). - PT eval - No PT follow up required    Active Problems:   Asthma, chronic - Stable resp status     Type 2 diabetes mellitus with circulatory disorder, with long-term current use of insulin (HCC) - Continue home meds    Carotid artery disease (HCC) - Continue aspirin and plavix     Essential hypertension - Continue isosorbide, lisinopril and metoprolol    Dyslipidemia associated with type 2 DM - Continue zetia and Crestor    DVT prophylaxis: Lovenox subQ Code Status: DNR/DNI Family Communication: family at the bedside this am    Consultants:   None   Procedures:   ECHO  EF 35-40%; Severe hypokinesis of the  mid-apicalanteroseptal, anterior, and apical myocardium; consistent with infarction in the distribution of the left anterior descending coronary artery; unchanged from the study of 12/19/2012. Akinesis of the basalinferior myocardium; consistent with infarction in the distribution of the right coronary artery; unchanged from the study of 12/19/2012. There was an increased relative contribution of atrial contraction to ventricular filling. Doppler parameters are consistent with  abnormal left ventricular relaxation (grade 1 diastolic  dysfunction).  Antimicrobials:   None    Signed:  Leisa Lenz, MD  [facility] 01/15/2017, 12:00 PM  Pager #: [phone]  Time spent in minutes: less than 30 minutes   Discharge Exam: Vitals:   01/15/17 0632 01/15/17 0930  BP: (!) 102/51 (!) 153/70  Pulse: 84 77  Resp: 18 20  Temp: 98 F (36.7 C) 97.1 F (36.2 C)   Vitals:   01/14/17 1245 01/14/17 2100 01/15/17 0632 01/15/17 0930  BP: (!) 119/55 123/61 (!) 102/51 (!) 153/70  Pulse: 74 96 84 77  Resp: _0 Temp: 98.1 F (36.7 C) 97.5 F (36.4 C) 98 F (36.7 C) 97.1 F (36.2 C)  TempSrc: Oral Oral Oral Oral  SpO2: 98% 97% 95% 96%  Weight:   75.9 kg (167 lb 6.4 oz)   Height:        General: Pt is alert, follows commands appropriately, not in acute distress Cardiovascular: Regular rate and rhythm, S1/S2 + Respiratory: Clear to auscultation bilaterally, no wheezing, no crackles, no rhonchi Abdominal: Soft, non tender, non distended, bowel sounds +, no guarding Extremities: no cyanosis, pulses palpable bilaterally DP and PT Neuro: Grossly nonfocal  Discharge Instructions  Discharge Instructions    Call MD for:  persistant nausea and vomiting    Complete by:  As directed    Call MD for:  redness, tenderness, or signs of infection (pain, swelling, redness, odor or green/yellow discharge around incision site)    Complete by:  As directed    Call MD for:  severe uncontrolled pain    Complete by:  As directed    Diet - low sodium heart healthy    Complete by:  As directed    Increase activity slowly    Complete by:  As directed      Allergies as of 01/15/2017      Reactions   Ativan [lorazepam] Other (See Comments)   Made him crazy   Colesevelam Other (See Comments)   Unknown reaction   Ezetimibe-simvastatin Other (See Comments)   Vytorin caused pancreatitis      Medication List    TAKE these medications   acetaminophen 500 MG tablet Commonly known as:  TYLENOL Take 1,000 mg by  mouth every 6 (six) hours as needed for headache (pain).   Adjustable Lancing Device Misc Reported on 04/30/2016   albuterol 108 (90 Base) MCG/ACT inhaler Commonly known as:  PROAIR HFA Inhale 2 puffs into the lungs every 6 (six) hours as needed for wheezing or shortness of breath.   Alcohol Prep 70 % Pads Reported on 04/30/2016   aspirin 81 MG EC tablet Take 2 tablets (162 mg total) by mouth daily.   azelastine 0.1 % nasal spray Commonly known as:  ASTELIN Place 2 sprays into both nostrils at bedtime. Use in each nostril as directed What changed:  when to take this  reasons to take this  additional instructions   BASAGLAR KWIKPEN 100 UNIT/ML Sopn Inject 0.17 mLs (17 Units total) into the skin at bedtime.   blood glucose meter kit and supplies Use as instructed   clopidogrel 75 MG tablet Commonly known as:  PLAVIX TAKE 1 TABLET DAILY WITH   BREAKFAST   doxazosin 2 MG tablet Commonly known as:  CARDURA Take 2 mg by mouth at bedtime.   ezetimibe 10 MG tablet Commonly known as:  ZETIA Take 1 tablet (10 mg total) by mouth daily.  ezetimibe 10 MG tablet Commonly known as:  ZETIA Take 10 mg by mouth daily.   finasteride 5 MG tablet Commonly known as:  PROSCAR Take 5 mg by mouth daily.   Fluticasone-Salmeterol 100-50 MCG/DOSE Aepb Commonly known as:  ADVAIR Inhale 1 puff into the lungs 2 (two) times daily as needed (shortness of breath).   furosemide 40 MG tablet Commonly known as:  LASIX TAKE 1 TABLET (40 MG TOTAL) BY MOUTH DAILY.   glucose blood test strip Use to test blood sugars twice daily   Grape Seed 100 MG Caps Take 200 mg by mouth daily.   Insulin Pen Needle 32G X 4 MM Misc To use w/ Basaglar   isosorbide mononitrate 30 MG 24 hr tablet Commonly known as:  IMDUR Take 0.5 tablets (15 mg total) by mouth daily.   lisinopril 2.5 MG tablet Commonly known as:  ZESTRIL Take 1 tablet (2.5 mg total) by mouth daily.   metFORMIN 850 MG  tablet Commonly known as:  GLUCOPHAGE Take 1.5 tablets (1,275 mg total) by mouth 2 (two) times daily with a meal. What changed:  how much to take   metoprolol tartrate 25 MG tablet Commonly known as:  LOPRESSOR Take 0.5 tablets (12.5 mg total) by mouth 2 (two) times daily.   pantoprazole 40 MG tablet Commonly known as:  PROTONIX TAKE 1 TABLET DAILY   rosuvastatin 10 MG tablet Commonly known as:  CRESTOR TAKE 1 TABLET DAILY   SYSTANE BALANCE OP Place 1 drop into both eyes daily as needed (dry eyes).      [place], MD. Schedule an appointment as soon as possible for a visit in 1 week(s).   Specialty:  Internal Medicine Contact information: [address] [place] [address] [phone]            The results of significant diagnostics from this hospitalization (including imaging, microbiology, ancillary and laboratory) are listed below for reference.    Significant Diagnostic Studies: Dg Chest 2 View  Result Date: 01/13/2017 CLINICAL DATA:  Syncope. EXAM: CHEST  2 VIEW COMPARISON:  11/28/2015 FINDINGS: There is no focal parenchymal opacity. There is no pleural effusion or pneumothorax. The heart and mediastinal contours are unremarkable. There is evidence of prior CABG. There is thoracic aortic atherosclerosis. The osseous structures are unremarkable. IMPRESSION: No active cardiopulmonary disease. Electronically Signed   By: Kathreen Devoid   On: 01/13/2017 16:23    Microbiology: No results found for this or any previous visit (from the past 240 hour(s)).   Labs: Basic Metabolic Panel:  Recent Labs Lab 01/13/17 1620  NA 135  K 4.0  CL 98*  CO2 25  GLUCOSE 240*  BUN 18  CREATININE 1.22  CALCIUM 9.1   Liver Function Tests: No results for input(s): AST, ALT, ALKPHOS, BILITOT, PROT, ALBUMIN in the last 168 hours. No results for input(s): LIPASE, AMYLASE in the last 168 hours. No results for input(s): AMMONIA in the last  168 hours. CBC:  Recent Labs Lab 01/13/17 1620  WBC 10.5  HGB 12.3*  HCT 36.5*  MCV 91.0  PLT 205   Cardiac Enzymes: No results for input(s): CKTOTAL, CKMB, CKMBINDEX, TROPONINI in the last 168 hours. BNP: BNP (last 3 results) No results for input(s): BNP in the last 8760 hours.  ProBNP (last 3 results) No results for input(s): PROBNP in the last 8760 hours.  CBG:  Recent Labs Lab 01/14/17 1618 01/14/17 2144 01/15/17 6144 01/15/17 3154  01/15/17 1134  GLUCAP 187* 117* 141* 130* 180*

## 2017-01-15 NOTE — Telephone Encounter (Signed)
Just discharged from the hospital, check on him, needs TCM Also, the hospitalist let me know that he likes a ultrasound of the aorta for AAA screening. Please enter the order

## 2017-01-15 NOTE — Progress Notes (Signed)
qPhysical Therapy Treatment Patient Details Name: [name]. MRN: [id number] DOB: 09-05-1926 Today's Date: 01/15/2017    History of Present Illness Pt adm with syncope. PMH - CABG, CHF, DM    PT Comments    Pt's mobility adequate for discharge home.  Pt near baseline mobility level. Encouraged pt/wife to walk daily including to mailbox, pick up paper, run errands, etc for functional exercise.   Pt's family present during session.  Discharge therapy at this time as he is planning to discharge home today with his wife.   Follow Up Recommendations  No PT follow up;Supervision for mobility/OOB     Equipment Recommendations  None recommended by PT    Recommendations for Other Services       Precautions / Restrictions Precautions Precautions: Fall Precaution Comments: mild cognitive deficits Restrictions Weight Bearing Restrictions: No    Mobility  Bed Mobility               General bed mobility comments: in bathroom upon arrival  Transfers Overall transfer level: Modified independent Equipment used: None             General transfer comment: toilet transfer independently, independent from bed wtih good safety  Ambulation/Gait Ambulation/Gait assistance: Supervision Ambulation Distance (Feet): 500 Feet Assistive device: None Gait Pattern/deviations: WFL(Within Functional Limits)     General Gait Details: supervision on unit secondary to cognitive deficits only   Stairs Stairs: Yes   Stair Management: One rail Left;Alternating pattern;Forwards Number of Stairs: 10 General stair comments: standing rest break after ascending stairs  Wheelchair Mobility    Modified Rankin (Stroke Patients Only)       Balance Overall balance assessment: Independent Sitting-balance support: No upper extremity supported;Feet supported Sitting balance-Leahy Scale: Normal     Standing balance support: No upper extremity supported;During functional  activity Standing balance-Leahy Scale: Good                              Cognition Arousal/Alertness: Awake/alert Behavior During Therapy: WFL for tasks assessed/performed Overall Cognitive Status: History of cognitive impairments - at baseline                                 General Comments: disoriented to place, situation, oriented to self and day of week, family members      Exercises      General Comments General comments (skin integrity, edema, etc.): pt's grandson reports pt did not sleep well last night      Pertinent Vitals/Pain Pain Assessment: No/denies pain    Home Living                      Prior Function            PT Goals (current goals can now be found in the care plan section) Acute Rehab PT Goals Patient Stated Goal: return home PT Goal Formulation: With patient/family Progress towards PT goals: Goals met/education completed, patient discharged from PT    Frequency           PT Plan Current plan remains appropriate    Co-evaluation             End of Session Equipment Utilized During Treatment: Gait belt Activity Tolerance: Patient tolerated treatment well Patient left: with family/visitor present;in chair Nurse Communication: Mobility status PT Visit Diagnosis: Unsteadiness on feet (R26.81)  Time: 7341-9379 PT Time Calculation (min) (ACUTE ONLY): 22 min  Charges:  $Gait Training: 8-22 mins                    G Codes:  Functional Assessment Tool Used: AM-PAC 6 Clicks Basic Mobility;Clinical judgement Functional Limitation: Mobility: Walking and moving around Mobility: Walking and Moving Around Current Status (K2409): At least 1 percent but less than 20 percent impaired, limited or restricted Mobility: Walking and Moving Around Goal Status [phone]): At least 1 percent but less than 20 percent impaired, limited or restricted Mobility: Walking and Moving Around Discharge Status [phone]): At  least 1 percent but less than 20 percent impaired, limited or restricted    Malka So, [place]   [place] 01/15/2017, 2:28 PM

## 2017-01-15 NOTE — Discharge Instructions (Signed)
Syncope °Syncope is when you lose temporarily pass out (faint). Signs that you may be about to pass out include: °· Feeling dizzy or light-headed. °· Feeling sick to your stomach (nauseous). °· Seeing all white or all black. °· Having cold, clammy skin. °If you passed out, get help right away. Call your local emergency services (911 in the [place] Do not drive yourself to the hospital. °Follow these instructions at home: °Pay attention to any changes in your symptoms. Take these actions to help with your condition: °· Have someone stay with you until you feel stable. °· Do not drive, use machinery, or play sports until your doctor says it is okay. °· Keep all follow-up visits as told by your doctor. This is important. °· If you start to feel like you might pass out, lie down right away and raise (elevate) your feet above the level of your heart. Breathe deeply and steadily. Wait until all of the symptoms are gone. °· Drink enough fluid to keep your pee (urine) clear or pale yellow. °· If you are taking blood pressure or heart medicine, get up slowly and spend many minutes getting ready to sit and then stand. This can help with dizziness. °· Take over-the-counter and prescription medicines only as told by your doctor. °Get help right away if: °· You have a very bad headache. °· You have unusual pain in your chest, tummy, or back. °· You are bleeding from your mouth or rectum. °· You have black or tarry poop (stool). °· You have a very fast or uneven heartbeat (palpitations). °· It hurts to breathe. °· You pass out once or more than once. °· You have jerky movements that you cannot control (seizure). °· You are confused. °· You have trouble walking. °· You are very weak. °· You have vision problems. °These symptoms may be an emergency. Do not wait to see if the symptoms will go away. Get medical help right away. Call your local emergency services (911 in the [place] Do not drive yourself to the hospital. °This  information is not intended to replace advice given to you by your health care provider. Make sure you discuss any questions you have with your health care provider. °Document Released: 03/19/2008 Document Revised: 03/08/2016 Document Reviewed: 06/15/2015 °Elsevier Interactive Patient Education © 2017 [employer] ° °

## 2017-01-15 NOTE — Progress Notes (Signed)
  Echocardiogram 2D Echocardiogram has been performed.  [name] [name] 01/15/2017, 11:40 AM

## 2017-01-16 NOTE — Telephone Encounter (Signed)
Transition Care Management Follow-up Telephone Call  Per Discharge Summary: Admit date: 01/13/2017 Discharge date: 01/15/2017  Recommendations for Outpatient Follow-up:  1. No changes in meds on discharge   Discharge Diagnoses:  Principal Problem:   Syncope Active Problems:   Asthma, chronic   Type 2 diabetes mellitus with circulatory disorder, with long-term current use of insulin (HCC)   Sleep apnea- C-pap   Carotid artery disease (HCC)   Chronic combined systolic and diastolic CHF (congestive heart failure) (HCC)   Essential hypertension   CAD (coronary artery disease)    Discharge Condition: stable   Diet recommendation: as tolerated   --  Call completed w/ patient wife at patient's request. He does not hear well over the phone.   How have you been since you were released from the hospital? "Well, I feel pretty good."   Do you understand why you were in the hospital? yes   Do you understand the discharge instructions? yes   Where were you discharged to? Home   Items Reviewed:  Medications reviewed: yes  Allergies reviewed: yes  Dietary changes reviewed: yes  Referrals reviewed: no, none made   Functional Questionnaire:   Activities of Daily Living (ADLs):   He states they are independent in the following: ambulation, bathing and hygiene, feeding, continence, grooming, toileting and dressing States they require assistance with the following: none   Any transportation issues/concerns?: no   Any patient concerns? no   Confirmed importance and date/time of follow-up visits scheduled yes  Provider Appointment booked with Dr. Willow Ora 01/22/17 @ 11:30am  Confirmed with patient if condition begins to worsen call PCP or go to the ER.  Patient was given the office number and encouraged to call back with question or concerns.  : yes

## 2017-01-16 NOTE — Telephone Encounter (Signed)
Attempted to place order for US aorta, unable to order because insurance will not cover for dx screening for AAA. Please advise.

## 2017-01-16 NOTE — Telephone Encounter (Signed)
Unable to reach patient at time of TCM Call. Left message for patient to return call when available.  

## 2017-01-16 NOTE — Telephone Encounter (Signed)
Medicare will cover once in a lifetime screening for ages 22-75 and have smoked 100 cigarettes in lifetime or has family hx of AAA only.

## 2017-01-16 NOTE — Telephone Encounter (Signed)
Ok, will discuss at next oportunity

## 2017-01-16 NOTE — Telephone Encounter (Signed)
Kaylyn,  please try to find a diagnosis, if unable to, we'll try again when he comes back for a office visit

## 2017-01-21 ENCOUNTER — Other Ambulatory Visit: Payer: Self-pay

## 2017-01-22 ENCOUNTER — Encounter: Payer: Self-pay | Admitting: Internal Medicine

## 2017-01-22 ENCOUNTER — Ambulatory Visit (INDEPENDENT_AMBULATORY_CARE_PROVIDER_SITE_OTHER): Payer: Medicare Other | Admitting: Internal Medicine

## 2017-01-22 VITALS — BP 126/74 | HR 69 | Temp 98.2°F | Resp 14 | Ht 68.0 in | Wt 170.4 lb

## 2017-01-22 DIAGNOSIS — R55 Syncope and collapse: Secondary | ICD-10-CM

## 2017-01-22 DIAGNOSIS — J45909 Unspecified asthma, uncomplicated: Secondary | ICD-10-CM | POA: Diagnosis not present

## 2017-01-22 DIAGNOSIS — R51 Headache: Secondary | ICD-10-CM | POA: Diagnosis not present

## 2017-01-22 DIAGNOSIS — F0390 Unspecified dementia without behavioral disturbance: Secondary | ICD-10-CM | POA: Diagnosis not present

## 2017-01-22 DIAGNOSIS — E119 Type 2 diabetes mellitus without complications: Secondary | ICD-10-CM

## 2017-01-22 DIAGNOSIS — I739 Peripheral vascular disease, unspecified: Secondary | ICD-10-CM

## 2017-01-22 DIAGNOSIS — I779 Disorder of arteries and arterioles, unspecified: Secondary | ICD-10-CM

## 2017-01-22 DIAGNOSIS — R519 Headache, unspecified: Secondary | ICD-10-CM

## 2017-01-22 LAB — CBC WITH DIFFERENTIAL/PLATELET
BASOS ABS: 0.1 10*3/uL (ref 0.0–0.1)
Basophils Relative: 1.2 % (ref 0.0–3.0)
EOS PCT: 14.6 % — AB (ref 0.0–5.0)
Eosinophils Absolute: 1.6 10*3/uL — ABNORMAL HIGH (ref 0.0–0.7)
HCT: 38.5 % — ABNORMAL LOW (ref 39.0–52.0)
HEMOGLOBIN: 13.2 g/dL (ref 13.0–17.0)
Lymphocytes Relative: 12.6 % (ref 12.0–46.0)
Lymphs Abs: 1.4 10*3/uL (ref 0.7–4.0)
MCHC: 34.3 g/dL (ref 30.0–36.0)
MCV: 91.3 fl (ref 78.0–100.0)
MONOS PCT: 12.6 % — AB (ref 3.0–12.0)
Monocytes Absolute: 1.4 10*3/uL — ABNORMAL HIGH (ref 0.1–1.0)
Neutro Abs: 6.6 10*3/uL (ref 1.4–7.7)
Neutrophils Relative %: 59 % (ref 43.0–77.0)
Platelets: 238 10*3/uL (ref 150.0–400.0)
RBC: 4.22 Mil/uL (ref 4.22–5.81)
RDW: 13.3 % (ref 11.5–15.5)
WBC: 11.2 10*3/uL — AB (ref 4.0–10.5)

## 2017-01-22 LAB — BASIC METABOLIC PANEL
BUN: 15 mg/dL (ref 6–23)
CALCIUM: 9.3 mg/dL (ref 8.4–10.5)
CO2: 27 mEq/L (ref 19–32)
Chloride: 100 mEq/L (ref 96–112)
Creatinine, Ser: 0.86 mg/dL (ref 0.40–1.50)
GFR: 89.03 mL/min (ref >60.00)
Glucose, Bld: 132 mg/dL — ABNORMAL HIGH (ref 70–99)
POTASSIUM: 4 meq/L (ref 3.5–5.1)
SODIUM: 135 meq/L (ref 135–145)

## 2017-01-22 NOTE — Progress Notes (Signed)
Subjective:    Patient ID: Ryan Plumb., male    DOB: 1926-07-25, 83 y.o.   MRN: [id number]  DOS:  01/22/2017 Type of visit - description : TCM 7 Interval history: Admitted to the hospital and discharge 01/15/2017. He was admitted after syncope at home. His eyes rolled back and he slumped over and fell on the grass. Was not responsive,  breathing normally. When EMS arrived he was pale and clammy. Blood pressure was 88. At the ER EKG shows sinus rhythm with LAFB, BP was better, chest x-ray negative, blood sugar was 240. Was admitted for further eval. Echo showed EF of 35% with hypokinesis. No change from previous. Etiology of syncope unclear and patient was sent home.  Review of Systems Since he left the hospital, no further events. Syncope was not associated with chest pain, palpitations. No nausea or vomiting. No bladder or bowel incontinence. No seizure activity. No major injury at the time of the fall. His blood sugars are checked twice a day and they are not low. No urinary symptoms. He has noted mild cough and wheezing. Taking Advair consistently?Marland Kitchen When asked, he admits to a headache (the day of the syncope he did not have any particularly severe headache): on-off, going on for at least few weeks, at times is intense, due to dementia is hard to get a better description   Past Medical History:  Diagnosis Date  . Acute CHF ([facility]) 09/15/2012  . Asthma    pt denies this hx on 11/02/2013  . BPH (benign prostatic hyperplasia)   . CAD (coronary artery disease)    CABG '97, SVG DES 12/13  . Colonic polyp   . Exertional shortness of breath   . GERD (gastroesophageal reflux disease)   . Hearing loss   . History of pancreatitis    pt denies this hx on 11/02/2013  . Hyperlipidemia   . Hypertension   . Intrinsic asthma 06/18/2007   Qualifier: Diagnosis of  By: Dance CMA ([place]), Kim    . Ischemic cardiomyopathy 09/16/2012   EF 25%- improved to 45% 3/14  . Myocardial infarction 1997    . Obstructed, uropathy   . PVD (peripheral vascular disease) (HCC)    moderate bilat carotid disease  . Syncope and collapse 11/02/2013   "didn't pass out" (11/02/2013)  . Type II diabetes mellitus ([place])     Past Surgical History:  Procedure Laterality Date  . CATARACT EXTRACTION W/ INTRAOCULAR LENS  IMPLANT, BILATERAL Bilateral ~ 1970  . CHOLECYSTECTOMY  2000's  . CORONARY ANGIOPLASTY WITH STENT PLACEMENT  Dec 2013   DES to SVG-CFX/PDA  . CORONARY ARTERY BYPASS GRAFT  1997   "CABG X 5"  . INGUINAL HERNIA REPAIR Bilateral ~ 1950  . LEFT HEART CATHETERIZATION WITH CORONARY/GRAFT ANGIOGRAM N/A 09/16/2012   Procedure: LEFT HEART CATHETERIZATION WITH Beatrix Fetters;  Surgeon: Lorretta Harp, MD;  Location: [facility] CATH LAB;  Service: Cardiovascular;  Laterality: N/A;  . NASAL SEPTUM SURGERY  2007  . RETINAL DETACHMENT SURGERY Left ?1995  . SHOULDER OPEN ROTATOR CUFF REPAIR Right     Social History   Social History  . Marital status: Married    Spouse name: N/A  . Number of children: 1  . Years of education: N/A   Occupational History  . 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 Smoking status: Former Smoker    Types: Cigars  . Smokeless tobacco: Never Used  Comment: 11/02/2013 "smoked cigars when I was 18 or so; didn't smoke many"  . Alcohol use 1.2 oz/week    2 Glasses of wine per week  . Drug use: No  . Sexual activity: No   Other Topics Concern  . 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 Always use your most recent med list.          acetaminophen 500 MG tablet Commonly known as:  TYLENOL Take 1,000 mg by mouth every 6 (six) hours as needed for headache (pain).   Adjustable Lancing Device Misc Reported on 04/30/2016   albuterol 108 (90 Base) MCG/ACT inhaler Commonly known as:  PROAIR HFA Inhale 2 puffs into the lungs every 6 (six) hours as needed for wheezing or shortness of breath.   Alcohol Prep 70 % Pads Reported on 04/30/2016   aspirin 81 MG EC tablet Take 2 tablets (162 mg total) by mouth daily.   azelastine 0.1 % nasal spray Commonly known as:  ASTELIN Place 2 sprays into both nostrils at bedtime. Use in each nostril as directed   BASAGLAR KWIKPEN 100 UNIT/ML Sopn Inject 0.17 mLs (17 Units total) into the skin at bedtime.   blood glucose meter kit and supplies Use as instructed   clopidogrel 75 MG tablet Commonly known as:  PLAVIX TAKE 1 TABLET DAILY WITH   BREAKFAST   doxazosin 2 MG tablet Commonly known as:  CARDURA Take 2 mg by mouth at bedtime.   ezetimibe 10 MG tablet Commonly known as:  ZETIA Take 1 tablet (10 mg total) by mouth daily.   finasteride 5 MG tablet Commonly known as:  PROSCAR Take 5 mg by mouth daily.   Fluticasone-Salmeterol 100-50 MCG/DOSE Aepb Commonly known as:  ADVAIR Inhale 1 puff into the lungs 2 (two) times daily as needed (shortness of breath).   furosemide 40 MG tablet Commonly known as:  LASIX TAKE 1 TABLET (40 MG TOTAL) BY MOUTH DAILY.   glucose blood test strip Use to test blood sugars twice daily   Grape Seed 100 MG Caps Take 200 mg by mouth daily.   Insulin Pen Needle 32G X 4 MM Misc To use w/ Basaglar   isosorbide mononitrate 30 MG 24 hr tablet Commonly known as:  IMDUR Take 0.5 tablets (15 mg total) by mouth daily.   lisinopril 2.5 MG tablet Commonly known as:  ZESTRIL Take 1 tablet (2.5 mg total) by mouth daily.   metFORMIN 850 MG tablet Commonly known as:   GLUCOPHAGE Take 1.5 tablets (1,275 mg total) by mouth 2 (two) times daily with a meal.   metoprolol tartrate 25 MG tablet Commonly known as:  LOPRESSOR Take 0.5 tablets (12.5 mg total) by mouth 2 (two) times daily.   pantoprazole 40 MG tablet Commonly known as:  PROTONIX TAKE 1 TABLET DAILY   rosuvastatin 10 MG tablet Commonly known as:  CRESTOR TAKE 1 TABLET  DAILY   SYSTANE BALANCE OP Place 1 drop into both eyes daily as needed (dry eyes).          Objective:   Physical Exam BP 126/74 (BP Location: Left Arm, Patient Position: Sitting, Cuff Size: Small)   Pulse 69   Temp 98.2 F (36.8 C) (Oral)   Resp 14   Ht '5\' 8"'  (1.727 m)   Wt 170 lb 6 oz (77.3 kg)   SpO2 95%   BMI 25.91 kg/m  General:   Well developed, well nourished . NAD.  HEENT:  Normocephalic . Face symmetric, atraumatic Neck: Not stiff, range of motion is slightly decreased Lungs:  Mild wheezing bilaterally. Normal respiratory effort, no intercostal retractions, no accessory muscle use. Heart: RRR,  no murmur.  No pretibial edema bilaterally  Skin: Not pale. Not jaundice Neurologic:  alert & oriented to self, not oriented to time, did not know he was in the second floor.  Speech normal, gait appropriate for age and unassisted EOMI, pupils symmetric, motor symmetric. Psych--  No anxious or depressed appearing.      Assessment & Plan:   Assessment Diabetes, no neuropathy as of 10-2015 10-2015: d/c Lantus but had to go back on it 10-2015 , dc Actos (dt CHF) and start metformin Hyperlipidemia Hypertension CV: --CAD, CABG 8938 --CHF: Systolic, diastolic, EF echocardiogram 2014---45% --Peripheral vascular disease OSA: On CPAP Asthma Hearing loss   PLAN Syncope: Etiology unclear, could have been a vasovagal episode, he was somewhat hypotensive. Doubt hypoglycemia. On review of systems, he has episodic headache, sometimes  intense. Will get a MRI/MRA of the brain. Will also defer further cardiac  eval to his primary cardiologist. Will send a referral. At the hospital, creatinine was a slightly and hemoglobin decrease. Will check that today. Headache: As above Dementia: With syncope and headache, will get a MRI/MRA DM: Last A1c 7.4, satisfactory, no change Asthma: Some wheezing today, recommend Advair twice a day and albuterol as needed RTC 4 weeks

## 2017-01-22 NOTE — Progress Notes (Signed)
Pre visit review using our clinic review tool, if applicable. No additional management support is needed unless otherwise documented below in the visit note. 

## 2017-01-22 NOTE — Patient Instructions (Signed)
GO TO THE LAB : Get the blood work     GO TO THE FRONT DESK Schedule your next appointment for a  routine checkup in 4 weeks  Will schedule a MRI  Will refer you to   cardiology

## 2017-01-23 NOTE — Assessment & Plan Note (Addendum)
Syncope: Etiology unclear, could have been a vasovagal episode, he was somewhat hypotensive. Doubt hypoglycemia. On review of systems, he has episodic headache, sometimes  intense. Will get a MRI/MRA of the brain. Will also defer further cardiac eval to his primary cardiologist. Will send a referral. At the hospital, creatinine was a slightly and hemoglobin decrease. Will check that today. Headache: As above Dementia: With syncope and headache, will get a MRI/MRA DM: Last A1c 7.4, satisfactory, no change Asthma: Some wheezing today, recommend Advair twice a day and albuterol as needed RTC 4 weeks

## 2017-01-26 ENCOUNTER — Ambulatory Visit (HOSPITAL_BASED_OUTPATIENT_CLINIC_OR_DEPARTMENT_OTHER)
Admission: RE | Admit: 2017-01-26 | Discharge: 2017-01-26 | Disposition: A | Discharge status: Home / Self Care | Payer: Medicare Other | Source: Ambulatory Visit | Attending: Internal Medicine | Admitting: Internal Medicine

## 2017-01-26 ENCOUNTER — Encounter (HOSPITAL_BASED_OUTPATIENT_CLINIC_OR_DEPARTMENT_OTHER): Payer: Self-pay

## 2017-01-26 DIAGNOSIS — F0390 Unspecified dementia without behavioral disturbance: Secondary | ICD-10-CM

## 2017-01-27 ENCOUNTER — Encounter: Payer: Self-pay | Admitting: Internal Medicine

## 2017-01-28 NOTE — Telephone Encounter (Signed)
-----   Message from Destiny A Best sent at 01/28/2017 12:50 PM EDT ----- Regarding: MRI Hello [name],   The patient above did advised that he no longer wanted to have an MRI done of his brain.   Thank you, Destiny - [facility]

## 2017-01-29 ENCOUNTER — Telehealth: Payer: Self-pay | Admitting: Internal Medicine

## 2017-01-29 NOTE — Telephone Encounter (Signed)
Please take 30 minutes prior to procedure.

## 2017-01-29 NOTE — Telephone Encounter (Signed)
See patient message, was unable to stay for the MRI. He is intolerant to Ativan. Please call the patient's wife, we could try Atarax 25 mg 1 tab before the procedure (will get drowsy) to see if he can tolerate it. If they choose to try, call Atarax 25 mg one tablet before the procedure, #3 no refills and scheduled MRIs.

## 2017-01-29 NOTE — Telephone Encounter (Signed)
Garay,[name] (spouse) returning call [phone]

## 2017-01-29 NOTE — Telephone Encounter (Signed)
Called patient and left message to return call

## 2017-01-29 NOTE — Telephone Encounter (Signed)
Spoke w/ spouse and patient, they are willing to try Atarax. They would like to know how long before procedure he should take the medication. Please advise and I will send to pharmacy. They are aware PCP is out office and rx will be sent in tomorrow.

## 2017-01-30 MED ORDER — HYDROXYZINE HCL 25 MG PO TABS: 25.0000 mg | ORAL_TABLET | ORAL | 0 refills | Status: DC

## 2017-01-30 NOTE — Telephone Encounter (Signed)
Medication filled to pharmacy as requested.   

## 2017-02-15 ENCOUNTER — Ambulatory Visit (INDEPENDENT_AMBULATORY_CARE_PROVIDER_SITE_OTHER): Payer: Medicare Other | Admitting: Cardiovascular Disease

## 2017-02-15 ENCOUNTER — Encounter: Payer: Self-pay | Admitting: Cardiovascular Disease

## 2017-02-15 VITALS — BP 118/57 | HR 71 | Ht 67.5 in | Wt 173.0 lb

## 2017-02-15 DIAGNOSIS — I255 Ischemic cardiomyopathy: Secondary | ICD-10-CM

## 2017-02-15 DIAGNOSIS — I10 Essential (primary) hypertension: Secondary | ICD-10-CM | POA: Diagnosis not present

## 2017-02-15 DIAGNOSIS — E7800 Pure hypercholesterolemia, unspecified: Secondary | ICD-10-CM | POA: Diagnosis not present

## 2017-02-15 DIAGNOSIS — I779 Disorder of arteries and arterioles, unspecified: Secondary | ICD-10-CM | POA: Diagnosis not present

## 2017-02-15 DIAGNOSIS — I5042 Chronic combined systolic (congestive) and diastolic (congestive) heart failure: Secondary | ICD-10-CM

## 2017-02-15 DIAGNOSIS — I739 Peripheral vascular disease, unspecified: Secondary | ICD-10-CM

## 2017-02-15 NOTE — Assessment & Plan Note (Signed)
History of essential hypertension with blood pressure measured today at 118/57. He is on lisinopril. Continue current meds at current dosing

## 2017-02-15 NOTE — Progress Notes (Signed)
02/15/2017 [name].   11-Jan-1926  [id number]  Primary Physician Kathlene November, MD Primary Cardiologist: Lorretta Harp MD Renae Gloss  HPI:  [name] is an 83 year old married Caucasian male with a history of CAD status post coronary artery bypass grafting in 1997 with a LIMA to his LAD, a vein to a first diagonal branch, OM and PDA. I last saw him in the office 06/05/16. His other problems include hypertension, hyperlipidemia and noninsulin-requiring diabetes. He has known peripheral vascular with moderate bilateral internal carotid artery stenosis which we are following by duplex ultrasound. He had a Myoview performed September 20, 2011, which showed scar in the LAD territory unchanged from prior studies with an EF of 35% to 45% by 2D echo. He presented to [facility] in early December with shortness of breath and congestive heart failure and was transferred to [facility] for further evaluation. He did have slightly elevated troponins and catheterization revealed stenosis in his SVG to his PDA which was stented with a DES stent. His EF at that time was 20% to 25% and a LifeVest was placed. He has done well since that time and is relatively asymptomatic. Limited echo today revealed an EF in the 45% range. Since I saw him back in 3 months ago he has seen Kerin Ransom has increased his Lasix. He continues to have dyspnea on exertion with class III symptoms and admits to dietary indiscretion with regards to salt. When I saw him a month ago he was relating episodes of nitroglycerin responsive chest pain. I did begin him on a acting nitrate and obtain a Myoview stress test on 05/16/16 that showed a large scar in the LAD territory not significantly changed from his Myoview performed 09/20/11. Since I saw him last visit chest pain has resolved. He recently was admitted to [facility] 01/13/17 for 2 days with syncope. He was with his family at an event and he noticed that his eyes "rolled  back into his head and he was slumped over. He felt depressed. EMS was called. His initial blood pressure was 88/50. He was given a fluid bolus. His EKG showed sinus rhythm. Echo revealed ejection fraction of 35-40% similar to prior echoes. He's had no recurrent symptoms. He does complain of significant dyspnea on exertion which is functionally limiting.   Current Outpatient Prescriptions  Medication Sig Dispense Refill  . acetaminophen (TYLENOL) 500 MG tablet Take 1,000 mg by mouth every 6 (six) hours as needed for headache (pain).    Marland Kitchen albuterol (PROAIR HFA) 108 (90 Base) MCG/ACT inhaler Inhale 2 puffs into the lungs every 6 (six) hours as needed for wheezing or shortness of breath. 18 g 5  . Alcohol Swabs (ALCOHOL PREP) 70 % PADS Reported on 04/30/2016    . aspirin EC 81 MG EC tablet Take 2 tablets (162 mg total) by mouth daily.    Marland Kitchen azelastine (ASTELIN) 0.1 % nasal spray Place 2 sprays into both nostrils at bedtime. Use in each nostril as directed (Patient taking differently: Place 2 sprays into both nostrils at bedtime as needed (congestion/drainage). Use in each nostril as directed) 30 mL 5  . Blood Glucose Monitoring Suppl (BLOOD GLUCOSE METER) kit Use as instructed 1 each 0  . clopidogrel (PLAVIX) 75 MG tablet TAKE 1 TABLET DAILY WITH   BREAKFAST 90 tablet 2  . doxazosin (CARDURA) 2 MG tablet Take 2 mg by mouth at bedtime.    . finasteride (PROSCAR) 5 MG  tablet Take 5 mg by mouth daily.    . Fluticasone-Salmeterol (ADVAIR) 100-50 MCG/DOSE AEPB Inhale 1 puff into the lungs 2 (two) times daily as needed (shortness of breath). 180 each 3  . furosemide (LASIX) 40 MG tablet TAKE 1 TABLET (40 MG TOTAL) BY MOUTH DAILY. 30 tablet 10  . glucose blood test strip Use to test blood sugars twice daily 100 each 5  . Grape Seed 100 MG CAPS Take 200 mg by mouth daily.     . hydrOXYzine (ATARAX/VISTARIL) 25 MG tablet Take 1 tablet (25 mg total) by mouth 30 (thirty) minutes before procedure. 3 tablet 0  .  Insulin Glargine (BASAGLAR KWIKPEN) 100 UNIT/ML SOPN Inject 0.17 mLs (17 Units total) into the skin at bedtime. 45 mL 1  . Insulin Pen Needle 32G X 4 MM MISC To use w/ Basaglar 100 each 12  . isosorbide mononitrate (IMDUR) 30 MG 24 hr tablet Take 0.5 tablets (15 mg total) by mouth daily. 90 tablet 3  . Lancet Devices (ADJUSTABLE LANCING DEVICE) MISC Reported on 04/30/2016    . lisinopril (ZESTRIL) 2.5 MG tablet Take 1 tablet (2.5 mg total) by mouth daily. 90 tablet 3  . metFORMIN (GLUCOPHAGE) 850 MG tablet Take 1.5 tablets (1,275 mg total) by mouth 2 (two) times daily with a meal. (Patient taking differently: Take 850 mg by mouth 2 (two) times daily with a meal. ) 270 tablet 1  . metoprolol tartrate (LOPRESSOR) 25 MG tablet Take 0.5 tablets (12.5 mg total) by mouth 2 (two) times daily. 90 tablet 3  . pantoprazole (PROTONIX) 40 MG tablet TAKE 1 TABLET DAILY 90 tablet 3  . Propylene Glycol (SYSTANE BALANCE OP) Place 1 drop into both eyes daily as needed (dry eyes).     . rosuvastatin (CRESTOR) 10 MG tablet TAKE 1 TABLET DAILY 90 tablet 3  . ezetimibe (ZETIA) 10 MG tablet Take 1 tablet (10 mg total) by mouth daily. 90 tablet 3   No current facility-administered medications for this visit.     Allergies  Allergen Reactions  . Ativan [Lorazepam] Other (See Comments)    Made him crazy  . Colesevelam Other (See Comments)    Unknown reaction  . Ezetimibe-Simvastatin Other (See Comments)    Vytorin caused pancreatitis    Social History   Social History  . Marital status: Married    Spouse name: N/A  . Number of children: 1  . Years of education: N/A   Occupational History  . 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 Smoking status: Former Smoker    Types: Cigars  . Smokeless tobacco: Never Used     Comment: 11/02/2013 "smoked cigars when I was 18 or so; didn't smoke many"  . Alcohol use 1.2 oz/week    2 Glasses of wine per week  . Drug use: No  . Sexual  activity: No   Other Topics Concern  . Not on file   Social History Narrative   Married '50-24 yrs, divorced; married '82   1 son- '51   Retired but keeps up 4 rental houses, mows   End of Life: no CPR, no heroic or futile measures     Review of Systems: General: negative for chills, fever, night sweats or weight changes.  Cardiovascular: negative for chest pain, dyspnea on exertion, edema, orthopnea, palpitations, paroxysmal nocturnal dyspnea or shortness of breath Dermatological: negative for rash Respiratory: negative for cough or wheezing Urologic: negative for  hematuria Abdominal: negative for nausea, vomiting, diarrhea, bright red blood per rectum, melena, or hematemesis Neurologic: negative for visual changes, syncope, or dizziness All other systems reviewed and are otherwise negative except as noted above.    Blood pressure (!) 118/57, pulse 71, height 5' 7.5" (1.715 m), weight 173 lb (78.5 kg), SpO2 96 %.  General appearance: alert and no distress Neck: no adenopathy, no carotid bruit, no JVD, supple, symmetrical, trachea midline and thyroid not enlarged, symmetric, no tenderness/mass/nodules Lungs: clear to auscultation bilaterally Heart: regular rate and rhythm, S1, S2 normal, no murmur, click, rub or gallop Extremities: extremities normal, atraumatic, no cyanosis or edema  EKG not performed today  ASSESSMENT AND PLAN:   Hyperlipidemia History of hyperlipidemia on statin therapy and Zetia followed by his PCP  Hx of CABG History of coronary artery disease status post coronary artery bypass grafting in 1997 with a LIMA to his LAD, vein to the first diagonal branch, obtuse marginal branch and PDA. He has had cardiac catheterization back in 2012 revealed a stenosis in his PDA vein graft which was stented with a drug-eluting stent. His EF at that time with 20 of 25% and he did wear a life vest at that time. He denies chest pain. He did have a Myoview stress test performed  05/16/16 that showed a large scar in the LAD territory not significantly changed from his Myoview performed 09/20/11.  ICM, EF 20-25% by cath 09/16/12- improved to 45 % by Echo 3/14 History of ischemic cardiomyopathy with recent echo that showed an EF in the 35-40% range. He does have class III heart failure symptoms. I'm going to refer him to the heart failure clinic for further evaluation and medication adjustment assessment. He may benefit from the addition of Entresto.   PVD- moderate carotid disease History of carotid artery disease with Doppler performed 05/05/15 revealing moderate right ICA stenosis. We will repeat carotid Doppler studies.  Essential hypertension History of essential hypertension with blood pressure measured today at 118/57. He is on lisinopril. Continue current meds at current dosing      Lorretta Harp MD [facility], [facility] 02/15/2017 4:57 PM

## 2017-02-15 NOTE — Assessment & Plan Note (Signed)
History of carotid artery disease with Doppler performed 05/05/15 revealing moderate right ICA stenosis. We will repeat carotid Doppler studies.

## 2017-02-15 NOTE — Assessment & Plan Note (Signed)
History of ischemic cardiomyopathy with recent echo that showed an EF in the 35-40% range. He does have class III heart failure symptoms. I'm going to refer him to the heart failure clinic for further evaluation and medication adjustment assessment. He may benefit from the addition of Entresto.

## 2017-02-15 NOTE — Assessment & Plan Note (Addendum)
History of hyperlipidemia on statin therapy and Zetia followed by his PCP 

## 2017-02-15 NOTE — Assessment & Plan Note (Signed)
History of coronary artery disease status post coronary artery bypass grafting in 1997 with a LIMA to his LAD, vein to the first diagonal branch, obtuse marginal branch and PDA. He has had cardiac catheterization back in 2012 revealed a stenosis in his PDA vein graft which was stented with a drug-eluting stent. His EF at that time with 20 of 25% and he did wear a life vest at that time. He denies chest pain. He did have a Myoview stress test performed 05/16/16 that showed a large scar in the LAD territory not significantly changed from his Myoview performed 09/20/11.

## 2017-02-15 NOTE — Patient Instructions (Signed)
Medication Instructions: Your physician recommends that you continue on your current medications as directed. Please refer to the Current Medication list given to you today.  Testing:  Your physician has requested that you have a carotid duplex. This test is an ultrasound of the carotid arteries in your neck. It looks at blood flow through these arteries that supply the brain with blood. Allow one hour for this exam. There are no restrictions or special instructions.   Follow-Up: You have been referred to the [facility]--Ryan Bensimohn, MD.  We request that you follow-up in: 6 months with Ryan Shelter, PA and in 12 months with [name] will receive a reminder letter in the mail two months in advance. If you don't receive a letter, please call our office to schedule the follow-up appointment.  If you need a refill on your cardiac medications before your next appointment, please call your pharmacy.

## 2017-02-28 ENCOUNTER — Ambulatory Visit (INDEPENDENT_AMBULATORY_CARE_PROVIDER_SITE_OTHER): Payer: Medicare Other | Admitting: Internal Medicine

## 2017-02-28 ENCOUNTER — Encounter: Payer: Self-pay | Admitting: Internal Medicine

## 2017-02-28 VITALS — BP 110/50 | HR 70 | Temp 97.6°F | Ht 67.5 in | Wt 171.6 lb

## 2017-02-28 DIAGNOSIS — J45909 Unspecified asthma, uncomplicated: Secondary | ICD-10-CM

## 2017-02-28 DIAGNOSIS — I255 Ischemic cardiomyopathy: Secondary | ICD-10-CM

## 2017-02-28 DIAGNOSIS — E1159 Type 2 diabetes mellitus with other circulatory complications: Secondary | ICD-10-CM | POA: Diagnosis not present

## 2017-02-28 DIAGNOSIS — R55 Syncope and collapse: Secondary | ICD-10-CM | POA: Diagnosis not present

## 2017-02-28 DIAGNOSIS — Z794 Long term (current) use of insulin: Secondary | ICD-10-CM | POA: Diagnosis not present

## 2017-02-28 DIAGNOSIS — I10 Essential (primary) hypertension: Secondary | ICD-10-CM

## 2017-02-28 NOTE — Progress Notes (Signed)
Subjective:    Patient ID: Ryan Plumb., male    DOB: Jun 02, 1926, 83 y.o.   MRN: [id number]  DOS:  02/28/2017 Type of visit - description : f/u Interval history: Syncope: No further events, saw cardiology, they rec a carotid ultrasound. MRI was not done. Labs were very good. Complain of pain at the second left toe. Has not seen any redness swelling or warmness.  Review of Systems Denies chest pain or palpitations Still has mild occasional headache. Blood sugars are usually around 200. BPs range from 110-120. On a occasion, systolic BP has been 916.  Past Medical History:  Diagnosis Date  . Acute CHF ([place]) 09/15/2012  . Asthma    pt denies this hx on 11/02/2013  . BPH (benign prostatic hyperplasia)   . CAD (coronary artery disease)    CABG '97, SVG DES 12/13  . Colonic polyp   . Exertional shortness of breath   . GERD (gastroesophageal reflux disease)   . Hearing loss   . History of pancreatitis    pt denies this hx on 11/02/2013  . Hyperlipidemia   . Hypertension   . Intrinsic asthma 06/18/2007   Qualifier: Diagnosis of  By: Dance CMA ([place]), Kim    . Ischemic cardiomyopathy 09/16/2012   EF 25%- improved to 45% 3/14  . Myocardial infarction ([place]) 1997  . Obstructed, uropathy   . PVD (peripheral vascular disease) ([facility])    moderate bilat carotid disease  . Syncope and collapse 11/02/2013   "didn't pass out" (11/02/2013)  . Type II diabetes mellitus ([place])     Past Surgical History:  Procedure Laterality Date  . CATARACT EXTRACTION W/ INTRAOCULAR LENS  IMPLANT, BILATERAL Bilateral ~ 1970  . CHOLECYSTECTOMY  2000's  . CORONARY ANGIOPLASTY WITH STENT PLACEMENT  Dec 2013   DES to SVG-CFX/PDA  . CORONARY ARTERY BYPASS GRAFT  1997   "CABG X 5"  . INGUINAL HERNIA REPAIR Bilateral ~ 1950  . LEFT HEART CATHETERIZATION WITH CORONARY/GRAFT ANGIOGRAM N/A 09/16/2012   Procedure: LEFT HEART CATHETERIZATION WITH Beatrix Fetters;  Surgeon: Lorretta Harp, MD;  Location:  [facility] CATH LAB;  Service: Cardiovascular;  Laterality: N/A;  . NASAL SEPTUM SURGERY  2007  . RETINAL DETACHMENT SURGERY Left ?1995  . SHOULDER OPEN ROTATOR CUFF REPAIR Right     Social History   Social History  . Marital status: Married    Spouse name: N/A  . Number of children: 1  . Years of education: N/A   Occupational History  . 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 Smoking status: Former Smoker    Types: Cigars  . Smokeless tobacco: Never Used     Comment: 11/02/2013 "smoked cigars when I was 18 or so; didn't smoke many"  . Alcohol use 1.2 oz/week    2 Glasses of wine per week  . Drug use: No  . Sexual activity: No   Other Topics Concern  . 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 Always use your most recent med list.          acetaminophen 500 MG tablet Commonly known as:  TYLENOL Take 1,000 mg by mouth every 6 (six) hours as needed for headache (pain).   Adjustable Lancing Device Misc Reported on 04/30/2016   albuterol 108 (90 Base) MCG/ACT inhaler Commonly known as:  PROAIR HFA Inhale 2 puffs into the lungs every 6 (six) hours as needed for wheezing or shortness of breath.   Alcohol Prep 70 % Pads Reported on 04/30/2016   aspirin 81 MG EC tablet Take 2 tablets (162 mg total) by mouth daily.   azelastine 0.1 % nasal spray Commonly known as:  ASTELIN Place 2 sprays into both nostrils at bedtime. Use in each nostril as directed   BASAGLAR KWIKPEN 100 UNIT/ML Sopn Inject  0.17 mLs (17 Units total) into the skin at bedtime.   blood glucose meter kit and supplies Use as instructed   clopidogrel 75 MG tablet Commonly known as:  PLAVIX TAKE 1 TABLET DAILY WITH   BREAKFAST   doxazosin 2 MG tablet Commonly known as:  CARDURA Take 2 mg by mouth at bedtime.   ezetimibe 10 MG tablet Commonly known as:  ZETIA Take 1 tablet (10 mg total) by mouth daily.   finasteride 5 MG tablet Commonly known as:  PROSCAR Take 5 mg by mouth daily.   Fluticasone-Salmeterol 100-50 MCG/DOSE Aepb Commonly known as:  ADVAIR Inhale 1 puff into the lungs 2 (two) times daily as needed (shortness of breath).   furosemide 40 MG tablet Commonly known as:  LASIX TAKE 1 TABLET (40 MG TOTAL) BY MOUTH DAILY.   glucose blood test strip Use to test blood sugars twice daily   Grape Seed 100 MG Caps Take 200 mg by mouth daily.   Insulin Pen Needle 32G X 4 MM Misc To use w/ Basaglar   isosorbide mononitrate 30 MG 24 hr tablet Commonly known as:  IMDUR Take 0.5 tablets (15 mg total) by mouth daily.   lisinopril 2.5 MG tablet Commonly known as:  ZESTRIL Take 1 tablet (2.5 mg total) by mouth daily.   metFORMIN 850 MG tablet Commonly known as:  GLUCOPHAGE Take 1.5 tablets (1,275 mg total) by mouth 2 (two) times daily with a meal.   metoprolol tartrate 25 MG tablet Commonly known as:  LOPRESSOR Take 0.5 tablets (12.5 mg total) by mouth 2 (two) times daily.   pantoprazole 40 MG tablet Commonly known as:  PROTONIX TAKE 1 TABLET DAILY   rosuvastatin 10 MG tablet Commonly known as:  CRESTOR TAKE 1 TABLET DAILY   SYSTANE BALANCE OP Place 1 drop into both eyes daily as needed (dry eyes).          Objective:   Physical Exam BP (!) 110/50 (BP Location: Left Arm, Patient Position: Sitting, Cuff Size: Normal)   Pulse 70   Temp 97.6 F (36.4 C) (Oral)   Ht 5' 7.5" (1.715 m)   Wt 171 lb 9.6 oz (77.8 kg)   SpO2 97%   BMI 26.48 kg/m  General:   Well developed, well  nourished . NAD.  HEENT:  Normocephalic . Face symmetric, atraumatic Lungs:  CTA B Normal respiratory effort, no intercostal retractions, no accessory muscle use. Heart: RRR,  no murmur.  No pretibial edema bilaterally  Skin: Not pale. Not jaundice Neurologic:  alert &  oriented X3.  Speech normal, gait appropriate for age and unassisted Psych--  Cognition and judgment appear intact.  Cooperative with normal attention span and concentration.  Behavior appropriate. No anxious or depressed appearing.      Assessment & Plan:   Assessment Diabetes, no neuropathy as of 10-2015 10-2015: d/c Lantus but had to go back on it 10-2015 , dc Actos (dt CHF) and started metformin Hyperlipidemia Hypertension CV: --CAD, CABG 5320 --CHF: Systolic, diastolic, EF echocardiogram 2014---45% --Peripheral vascular disease OSA: On CPAP Asthma Hearing loss   PLAN Syncope: No further events, could not tolerate MRI/MRA, declined to reschedule. Reason to recommend a  MRI discussed (r/o ICB, aneurysm), he seems to understand but again declined to pursue it. He is to let me know if he has further problems. DM: No change, continue insulin, metformin. CBGs around 200 HTN: Occasionally BP drops to 100, recommend to continue monitoring, if he has consistent low BPs he is to let us know. Otherwise continue Cardura, Lasix, Imdur, lisinopril, metoprolol.  Asthma: Lung exam normal today. RTC 4 months

## 2017-02-28 NOTE — Patient Instructions (Signed)
  GO TO THE FRONT DESK Schedule your next appointment for a  Check up in 4 months    Check the  blood pressure 3 or 3 times a  Week  Be sure your blood pressure is between 110/65 and  145/85. If it is consistently higher or lower, let me know

## 2017-03-01 NOTE — Assessment & Plan Note (Signed)
Syncope: No further events, could not tolerate MRI/MRA, declined to reschedule. Reason to recommend a  MRI discussed (r/o ICB, aneurysm), he seems to understand but again declined to pursue it. He is to let me know if he has further problems. DM: No change, continue insulin, metformin. CBGs around 200 HTN: Occasionally BP drops to 100, recommend to continue monitoring, if he has consistent low BPs he is to let us know. Otherwise continue Cardura, Lasix, Imdur, lisinopril, metoprolol.  Asthma: Lung exam normal today. RTC 4 months

## 2017-03-19 ENCOUNTER — Ambulatory Visit (HOSPITAL_COMMUNITY)
Admission: RE | Admit: 2017-03-19 | Discharge: 2017-03-19 | Disposition: A | Discharge status: Home / Self Care | Payer: Medicare Other | Source: Ambulatory Visit | Attending: Cardiovascular Disease | Admitting: Cardiovascular Disease

## 2017-03-19 DIAGNOSIS — I779 Disorder of arteries and arterioles, unspecified: Secondary | ICD-10-CM | POA: Insufficient documentation

## 2017-03-19 DIAGNOSIS — I739 Peripheral vascular disease, unspecified: Secondary | ICD-10-CM

## 2017-03-28 ENCOUNTER — Other Ambulatory Visit: Payer: Self-pay | Admitting: Cardiovascular Disease

## 2017-04-09 ENCOUNTER — Inpatient Hospital Stay (HOSPITAL_COMMUNITY): Admission: RE | Admit: 2017-04-09 | Payer: Medicare Other | Source: Ambulatory Visit | Admitting: Internal Medicine

## 2017-04-11 ENCOUNTER — Other Ambulatory Visit: Payer: Self-pay | Admitting: Cardiovascular Disease

## 2017-04-11 DIAGNOSIS — I739 Peripheral vascular disease, unspecified: Principal | ICD-10-CM

## 2017-04-11 DIAGNOSIS — I779 Disorder of arteries and arterioles, unspecified: Secondary | ICD-10-CM

## 2017-05-20 ENCOUNTER — Other Ambulatory Visit: Payer: Self-pay | Admitting: Cardiovascular Disease

## 2017-05-27 ENCOUNTER — Encounter (HOSPITAL_COMMUNITY): Payer: Self-pay | Admitting: Internal Medicine

## 2017-05-27 ENCOUNTER — Ambulatory Visit (HOSPITAL_COMMUNITY)
Admission: RE | Admit: 2017-05-27 | Discharge: 2017-05-27 | Disposition: A | Discharge status: Home / Self Care | Payer: Medicare Other | Source: Ambulatory Visit | Attending: Internal Medicine | Admitting: Internal Medicine

## 2017-05-27 VITALS — BP 103/60 | HR 70 | Wt 168.5 lb

## 2017-05-27 DIAGNOSIS — Z794 Long term (current) use of insulin: Secondary | ICD-10-CM | POA: Diagnosis not present

## 2017-05-27 DIAGNOSIS — R0600 Dyspnea, unspecified: Secondary | ICD-10-CM

## 2017-05-27 DIAGNOSIS — I5022 Chronic systolic (congestive) heart failure: Secondary | ICD-10-CM | POA: Diagnosis not present

## 2017-05-27 DIAGNOSIS — E1151 Type 2 diabetes mellitus with diabetic peripheral angiopathy without gangrene: Secondary | ICD-10-CM | POA: Diagnosis not present

## 2017-05-27 DIAGNOSIS — J45909 Unspecified asthma, uncomplicated: Secondary | ICD-10-CM | POA: Insufficient documentation

## 2017-05-27 DIAGNOSIS — Z888 Allergy status to other drugs, medicaments and biological substances status: Secondary | ICD-10-CM | POA: Diagnosis not present

## 2017-05-27 DIAGNOSIS — I255 Ischemic cardiomyopathy: Secondary | ICD-10-CM | POA: Insufficient documentation

## 2017-05-27 DIAGNOSIS — Z87891 Personal history of nicotine dependence: Secondary | ICD-10-CM | POA: Insufficient documentation

## 2017-05-27 DIAGNOSIS — I2510 Atherosclerotic heart disease of native coronary artery without angina pectoris: Secondary | ICD-10-CM | POA: Diagnosis not present

## 2017-05-27 DIAGNOSIS — E785 Hyperlipidemia, unspecified: Secondary | ICD-10-CM | POA: Diagnosis not present

## 2017-05-27 DIAGNOSIS — I110 Hypertensive heart disease with heart failure: Secondary | ICD-10-CM | POA: Insufficient documentation

## 2017-05-27 DIAGNOSIS — N400 Enlarged prostate without lower urinary tract symptoms: Secondary | ICD-10-CM | POA: Insufficient documentation

## 2017-05-27 DIAGNOSIS — I252 Old myocardial infarction: Secondary | ICD-10-CM | POA: Diagnosis not present

## 2017-05-27 DIAGNOSIS — Z79899 Other long term (current) drug therapy: Secondary | ICD-10-CM | POA: Insufficient documentation

## 2017-05-27 DIAGNOSIS — Z7902 Long term (current) use of antithrombotics/antiplatelets: Secondary | ICD-10-CM | POA: Insufficient documentation

## 2017-05-27 DIAGNOSIS — Z8042 Family history of malignant neoplasm of prostate: Secondary | ICD-10-CM | POA: Insufficient documentation

## 2017-05-27 DIAGNOSIS — Z7982 Long term (current) use of aspirin: Secondary | ICD-10-CM | POA: Insufficient documentation

## 2017-05-27 DIAGNOSIS — Z951 Presence of aortocoronary bypass graft: Secondary | ICD-10-CM | POA: Diagnosis not present

## 2017-05-27 DIAGNOSIS — Z800 Family history of malignant neoplasm of digestive organs: Secondary | ICD-10-CM | POA: Insufficient documentation

## 2017-05-27 DIAGNOSIS — R55 Syncope and collapse: Secondary | ICD-10-CM | POA: Insufficient documentation

## 2017-05-27 DIAGNOSIS — I5042 Chronic combined systolic (congestive) and diastolic (congestive) heart failure: Secondary | ICD-10-CM

## 2017-05-27 DIAGNOSIS — K219 Gastro-esophageal reflux disease without esophagitis: Secondary | ICD-10-CM | POA: Insufficient documentation

## 2017-05-27 NOTE — Progress Notes (Signed)
ADVANCED HF CLINIC CONSULT NOTE  Referring Physician:Dr Gwenlyn Found  Primary Care: Dr Larose Kells Primary Cardiologist:Dr Gwenlyn Found  HPI: [name] is a delightful 83 year old male from [facility] with a history of CAD, S/P CABG 1997, HTN, Hyperlipidemia, DMII, asthma, PAD and systolic HF referred to the HF clinic by Dr. Gwenlyn Found for HF med optimization.   Today he presents at the request of Dr Gwenlyn Found for HF consultation and medication optimization.   He has a h/o CAD and underwent CABG in 1997. Had [place] 05/2016-Moderate area of infarction in the mid/distal anterior wall , apex and septum. No ischemia. EF 41% with anteroapical hypokinesis   Echo 4/18 EF 35-40% RV ok. (reviewed personally  Says he is doing pretty good. Can do most things he wants to do but has to take his time. Gets SOB if he hurries. Denies CP, edema, orthopnea or PND.  Has a dry cough in the morning and at night but this is not debilitating. Weight trending down over the last 2 years. Weight at home 165 pounds. Appetite ok. Denies presyncope/syncope. Taking all medications. Never smoked. Previously worked in Systems analyst. BP is soft but no orthostasis. Admitted in 4/18 for syncope but felt to be vasovagal.    Cardiac Studies ECHO 01/2017 Left ventricle: The cavity size was normal. Systolic function was   moderately reduced. The estimated ejection fraction was in the   range of 35% to 40%. Severe hypokinesis of the   mid-apicalanteroseptal, anterior, and apical myocardium;   consistent with infarction in the distribution of the left   anterior descending coronary artery; unchanged from the study of   12/19/2012. Akinesis of the basalinferior myocardium; consistent   with infarction in the distribution of the right coronary artery;   unchanged from the study of 12/19/2012. There was an increased   relative contribution of atrial contraction to ventricular   filling. Doppler parameters are consistent with abnormal left   ventricular  relaxation (grade 1 diastolic dysfunction). - Aortic valve: Valve mobility was mildly restricted. There was   trivial regurgitation. - Mitral valve: Calcified, moderately thickened annulus. Normal   thickness leaflets . There was mild regurgitation. - Left atrium: The atrium was moderately dilated. - Right ventricle: The cavity size was mildly dilated. - Atrial septum: No defect or patent foramen ovale was identified. - Tricuspid valve: There was mild-moderate regurgitation directed   eccentrically.  Myoview 05/2016-Moderate area of infarction in the mid/distal anterior wall , apex and septum no ishcemia EF 41% with anteroapical hypokinesis   Review of Systems: [y] = yes, _0  = no   General: Weight gain _1 ; Weight loss [ Y]; Anorexia _2 ; Fatigue [Y ]; Fever _3 ; Chills _4 ; Weakness [Y ]  Cardiac: Chest pain/pressure _5 ; Resting SOB _6 ; Exertional SOB [Y ]; Orthopnea _7 ; Pedal Edema _8 ; Palpitations _9 ; Syncope _10 ; Presyncope _11 ; Paroxysmal nocturnal dyspnea_12   Pulmonary: Cough [name] ]; Wheezing_13 ; Hemoptysis_14 ; Sputum _15 ; Snoring _16   GI: Vomiting_17 ; Dysphagia_18 ; Melena_19 ; Hematochezia _20 ; Heartburn_21 ; Abdominal pain _22 ; Constipation _23 ; Diarrhea _24 ; BRBPR _25   GU: Hematuria_26 ; Dysuria _27 ; Nocturia_28   Vascular: Pain in legs with walking _29 ; Pain in feet with lying flat _30 ; Non-healing sores _31 ; Stroke _32 ; TIA _33 ; Slurred speech _34 ;  Neuro: Headaches_35 ; Vertigo_36 ; Seizures_37 ; Paresthesias_38 ;Blurred vision _39 ; Diplopia _40 ;  Vision changes _0   Ortho/Skin: Arthritis [Y]; Joint pain [Y ]; Muscle pain _1 ; Joint swelling _2 ; Back Pain [Y ]; Rash _3   Psych: Depression[Y ]; Anxiety_4   Heme: Bleeding problems _5 ; Clotting disorders _6 ; Anemia _7   Endocrine: Diabetes [Y ]; Thyroid dysfunction_8    Past Medical History:  Diagnosis Date  . Acute CHF ([place]) 09/15/2012  . Asthma    pt denies this hx on 11/02/2013  . BPH (benign prostatic hyperplasia)   . CAD  (coronary artery disease)    CABG '97, SVG DES 12/13  . Colonic polyp   . Exertional shortness of breath   . GERD (gastroesophageal reflux disease)   . Hearing loss   . History of pancreatitis    pt denies this hx on 11/02/2013  . Hyperlipidemia   . Hypertension   . Intrinsic asthma 06/18/2007   Qualifier: Diagnosis of  By: Dance CMA ([place]), Kim    . Ischemic cardiomyopathy 09/16/2012   EF 25%- improved to 45% 3/14  . Myocardial infarction ([place]) 1997  . Obstructed, uropathy   . PVD (peripheral vascular disease) ([facility])    moderate bilat carotid disease  . Syncope and collapse 11/02/2013   "didn't pass out" (11/02/2013)  . Type II diabetes mellitus ([place])     Current Outpatient Prescriptions  Medication Sig Dispense Refill  . acetaminophen (TYLENOL) 500 MG tablet Take 1,000 mg by mouth every 6 (six) hours as needed for headache (pain).    Marland Kitchen albuterol (PROAIR HFA) 108 (90 Base) MCG/ACT inhaler Inhale 2 puffs into the lungs every 6 (six) hours as needed for wheezing or shortness of breath. 18 g 5  . Alcohol Swabs (ALCOHOL PREP) 70 % PADS Reported on 04/30/2016    . aspirin EC 81 MG EC tablet Take 2 tablets (162 mg total) by mouth daily.    Marland Kitchen azelastine (ASTELIN) 0.1 % nasal spray Place 2 sprays into both nostrils at bedtime. Use in each nostril as directed 30 mL 5  . Blood Glucose Monitoring Suppl (BLOOD GLUCOSE METER) kit Use as instructed 1 each 0  . clopidogrel (PLAVIX) 75 MG tablet TAKE 1 TABLET DAILY WITH   BREAKFAST 90 tablet 2  . doxazosin (CARDURA) 2 MG tablet Take 2 mg by mouth at bedtime.    . finasteride (PROSCAR) 5 MG tablet Take 5 mg by mouth daily.    . Fluticasone-Salmeterol (ADVAIR) 100-50 MCG/DOSE AEPB Inhale 1 puff into the lungs 2 (two) times daily as needed (shortness of breath). 180 each 3  . furosemide (LASIX) 40 MG tablet TAKE 1 TABLET BY MOUTH EVERY DAY 30 tablet 10  . glucose blood test strip Use to test blood sugars twice daily 100 each 5  . Grape Seed 100 MG CAPS  Take 200 mg by mouth daily.     . Insulin Glargine (BASAGLAR KWIKPEN) 100 UNIT/ML SOPN Inject 0.17 mLs (17 Units total) into the skin at bedtime. 45 mL 1  . Insulin Pen Needle 32G X 4 MM MISC To use w/ Basaglar 100 each 12  . isosorbide mononitrate (IMDUR) 30 MG 24 hr tablet Take 0.5 tablets (15 mg total) by mouth daily. 90 tablet 3  . Lancet Devices (ADJUSTABLE LANCING DEVICE) MISC Reported on 04/30/2016    . lisinopril (ZESTRIL) 2.5 MG tablet Take 1 tablet (2.5 mg total) by mouth daily. 90 tablet 3  . metFORMIN (GLUCOPHAGE) 850 MG tablet Take 1.5 tablets (1,275 mg total) by mouth 2 (two) times daily  with a meal. 270 tablet 1  . metoprolol tartrate (LOPRESSOR) 25 MG tablet Take 0.5 tablets (12.5 mg total) by mouth 2 (two) times daily. 90 tablet 3  . pantoprazole (PROTONIX) 40 MG tablet TAKE 1 TABLET DAILY 90 tablet 3  . Propylene Glycol (SYSTANE BALANCE OP) Place 1 drop into both eyes daily as needed (dry eyes).     . rosuvastatin (CRESTOR) 10 MG tablet TAKE 1 TABLET DAILY 90 tablet 3  . ezetimibe (ZETIA) 10 MG tablet Take 1 tablet (10 mg total) by mouth daily. 90 tablet 3   No current facility-administered medications for this encounter.     Allergies  Allergen Reactions  . Ativan [Lorazepam] Other (See Comments)    Made him crazy  . Colesevelam Other (See Comments)    Unknown reaction  . Ezetimibe-Simvastatin Other (See Comments)    Vytorin caused pancreatitis      Social History   Social History  . Marital status: Married    Spouse name: N/A  . Number of children: 1  . Years of education: N/A   Occupational History  . 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 Smoking status: Former Smoker    Types: Cigars  . Smokeless tobacco: Never Used     Comment: 11/02/2013 "smoked cigars when I was 18 or so; didn't smoke many"  . Alcohol use 1.2 oz/week    2 Glasses of wine per week  . Drug use: No  . Sexual activity: No   Other Topics Concern  .  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 Prostate cancer Brother        dx ~ 64 y/o  . Colon cancer Neg Hx     Vitals:   05/27/17 1105  BP: 103/60  Pulse: 70  SpO2: 96%  Weight: 168 lb 8 oz (76.4 kg)    PHYSICAL EXAM: General:  Elderly. HOH. No respiratory difficulty HEENT: normal Neck: supple. no JVD. Carotids 2+ bilat; no bruits. No lymphadenopathy or thryomegaly appreciated. Cor: PMI nondisplaced. Regular rate & rhythm. No rubs, gallops or murmurs. Lungs: clear with decreased breath sounds throughout Abdomen: soft, nontender, nondistended. No hepatosplenomegaly. No bruits or masses. Good bowel sounds. Extremities: no cyanosis, clubbing, rash, edema Neuro: alert & oriented x 3, cranial nerves grossly intact. moves all 4 extremities w/o difficulty. Affect pleasant.   ASSESSMENT & PLAN: 1. Chronic Systolic Heart Failure- Due to iCM. ECHO 01/2017 EF 35-40%.  - NYHA III.  Volume status stable. Continue lasix 40 mg daily - Continue lopressor for now. Later could switch to bisoprolol or carvedilol.  - We discussed stopping lisinopril however he would like to continue for now. Could consider switching to  12.5 mg losartan daily. Would not use entresto due to concern for low BP.  2. CAD- s/p CABG 1997 . Cardiac Cath 2012 DES  - No s/s ischemia - Myoview 8/17 no ischemia - On statin, bb, plavix.  3. HTN - stable 4. Cough - May be due to ACE but currently not severe. Continue lisinopril for now but can switch to losartan 12.5 if needed - check PFTS   Follow up as needed. Amy  Clegg NP-C  11:22 AM  Patient seen and examined with Darrick Grinder, NP. We discussed all aspects of the encounter. I agree with the assessment and plan as stated above.   [name] is a delightful 83  y/o male as above referred by Dr. Gwenlyn Found for further evaluation of HF and med optimization,. He is here with his wife.   Overall he is doing quite well. Able to do all ADLs but has to go slowly which seems appropriate given his age. Denies symptoms of ischemia or volume overload. His HF meds are at low dose but doubt he can tolerate more with his BP. Would not switch to Entresto at this point as I doubt BP can handle it. Currently not much to add to the excellent care he as been getting. Given his lung exam, we will check PFTs for completeness sake.   We would be happy to see him back as needed for further HF evaluation.   Glori Bickers, MD  9:45 PM

## 2017-05-27 NOTE — Patient Instructions (Signed)
Your physician has recommended that you have a pulmonary function test. Pulmonary Function Tests are a group of tests that measure how well air moves in and out of your lungs.  Follow up as needed

## 2017-05-31 ENCOUNTER — Encounter: Payer: Self-pay | Admitting: Internal Medicine

## 2017-06-03 ENCOUNTER — Ambulatory Visit (HOSPITAL_COMMUNITY)
Admission: RE | Admit: 2017-06-03 | Discharge: 2017-06-03 | Disposition: A | Discharge status: Home / Self Care | Payer: Medicare Other | Source: Ambulatory Visit | Attending: Internal Medicine | Admitting: Internal Medicine

## 2017-06-03 DIAGNOSIS — J449 Chronic obstructive pulmonary disease, unspecified: Secondary | ICD-10-CM | POA: Insufficient documentation

## 2017-06-03 DIAGNOSIS — R0600 Dyspnea, unspecified: Secondary | ICD-10-CM

## 2017-06-03 LAB — PULMONARY FUNCTION TEST
DL/VA % PRED: 91 %
DL/VA: 3.78 ml/min/mmHg/L
DLCO unc % pred: 57 %
DLCO unc: 13.86 ml/min/mmHg
FEF 25-75 POST: 1.23 L/s
FEF 25-75 Pre: 0.97 L/sec
FEF2575-%CHANGE-POST: 27 %
FEF2575-%PRED-PRE: 98 %
FEF2575-%Pred-Post: 125 %
FEV1-%Change-Post: 10 %
FEV1-%Pred-Post: 74 %
FEV1-%Pred-Pre: 67 %
FEV1-Post: 1.31 L
FEV1-Pre: 1.19 L
FEV1FVC-%CHANGE-POST: -20 %
FEV1FVC-%Pred-Pre: 108 %
FEV6-%Change-Post: 33 %
FEV6-%PRED-PRE: 65 %
FEV6-%Pred-Post: 86 %
FEV6-POST: 2.08 L
FEV6-Pre: 1.56 L
FEV6FVC-%Change-Post: -4 %
FEV6FVC-%PRED-POST: 106 %
FEV6FVC-%Pred-Pre: 111 %
FVC-%Change-Post: 39 %
FVC-%PRED-POST: 81 %
FVC-%PRED-PRE: 58 %
FVC-POST: 2.17 L
FVC-PRE: 1.56 L
PRE FEV6/FVC RATIO: 100 %
Post FEV1/FVC ratio: 60 %
Post FEV6/FVC ratio: 96 %
Pre FEV1/FVC ratio: 76 %
RV % pred: 139 %
RV: 3.52 L
TLC % PRED: 95 %
TLC: 5.59 L

## 2017-06-03 MED ORDER — ALBUTEROL SULFATE (2.5 MG/3ML) 0.083% IN NEBU
2.5000 mg | INHALATION_SOLUTION | Freq: Once | RESPIRATORY_TRACT | Status: AC
  Administered 2017-06-03: 2.5 mg via RESPIRATORY_TRACT

## 2017-06-11 ENCOUNTER — Other Ambulatory Visit: Payer: Self-pay | Admitting: Cardiovascular Disease

## 2017-06-11 ENCOUNTER — Other Ambulatory Visit: Payer: Self-pay

## 2017-06-11 DIAGNOSIS — J209 Acute bronchitis, unspecified: Secondary | ICD-10-CM

## 2017-06-11 DIAGNOSIS — E785 Hyperlipidemia, unspecified: Secondary | ICD-10-CM

## 2017-06-11 DIAGNOSIS — R079 Chest pain, unspecified: Secondary | ICD-10-CM

## 2017-06-11 DIAGNOSIS — Z951 Presence of aortocoronary bypass graft: Secondary | ICD-10-CM

## 2017-06-11 MED ORDER — ALBUTEROL SULFATE HFA 108 (90 BASE) MCG/ACT IN AERS: 2.0000 | INHALATION_SPRAY | Freq: Four times a day (QID) | RESPIRATORY_TRACT | 1 refills | Status: DC | PRN

## 2017-06-14 ENCOUNTER — Telehealth: Payer: Self-pay | Admitting: Cardiovascular Disease

## 2017-06-14 NOTE — Telephone Encounter (Signed)
PER OFFICE VISIT 05/27/17 WITH DR BENSIMHON- PATIENT WILL CONTINUE WITH LISINOPRIL 2.5 MG DAILY   NOTIFIED  MAIL ORDER  OKAY TO REFILL QUANTITY 41F 90 WITH 2 REFLLS

## 2017-06-14 NOTE — Telephone Encounter (Signed)
°  New Prob   Calling to clarify directions for Lisinopril prescription and if this medication should be filled or not.   Order Number [id number]

## 2017-06-22 ENCOUNTER — Other Ambulatory Visit: Payer: Self-pay | Admitting: Internal Medicine

## 2017-07-02 ENCOUNTER — Ambulatory Visit (INDEPENDENT_AMBULATORY_CARE_PROVIDER_SITE_OTHER): Payer: Medicare Other | Admitting: Internal Medicine

## 2017-07-02 ENCOUNTER — Encounter: Payer: Self-pay | Admitting: Internal Medicine

## 2017-07-02 VITALS — BP 104/57 | HR 66 | Temp 97.8°F | Wt 169.4 lb

## 2017-07-02 DIAGNOSIS — Z23 Encounter for immunization: Secondary | ICD-10-CM

## 2017-07-02 DIAGNOSIS — R0609 Other forms of dyspnea: Secondary | ICD-10-CM | POA: Diagnosis not present

## 2017-07-02 DIAGNOSIS — Z794 Long term (current) use of insulin: Secondary | ICD-10-CM | POA: Diagnosis not present

## 2017-07-02 DIAGNOSIS — I10 Essential (primary) hypertension: Secondary | ICD-10-CM

## 2017-07-02 DIAGNOSIS — I255 Ischemic cardiomyopathy: Secondary | ICD-10-CM

## 2017-07-02 DIAGNOSIS — E7800 Pure hypercholesterolemia, unspecified: Secondary | ICD-10-CM | POA: Diagnosis not present

## 2017-07-02 DIAGNOSIS — E1159 Type 2 diabetes mellitus with other circulatory complications: Secondary | ICD-10-CM

## 2017-07-02 MED ORDER — BUDESONIDE-FORMOTEROL FUMARATE 160-4.5 MCG/ACT IN AERO: 2.0000 | INHALATION_SPRAY | Freq: Two times a day (BID) | RESPIRATORY_TRACT | 12 refills | Status: DC

## 2017-07-02 NOTE — Progress Notes (Signed)
Pre visit review using our clinic review tool, if applicable. No additional management support is needed unless otherwise documented below in the visit note. 

## 2017-07-02 NOTE — Progress Notes (Signed)
Subjective:    Patient ID: Ryan Plumb., male    DOB: April 03, 1926, 83 y.o.   MRN: [id number]  DOS:  07/02/2017 Type of visit - description : rov Interval history: DM: Good compliance of medication, ambulatory CBGs around 200. HTN: Good compliance of medication, ambulatory BPs great at 120/60. CHF: Note from cardiology reviewed.    Review of Systems No CP DOE at baseline, on and off, able to do all his ADLs although sometimes gets SOB taking a shower (not consistently). He has occasional cough with whitish sputum sometimes. No hemoptysis. Occasional wheezing  Past Medical History:  Diagnosis Date  . Acute CHF ([place]) 09/15/2012  . Asthma    pt denies this hx on 11/02/2013  . BPH (benign prostatic hyperplasia)   . CAD (coronary artery disease)    CABG '97, SVG DES 12/13  . Colonic polyp   . Exertional shortness of breath   . GERD (gastroesophageal reflux disease)   . Hearing loss   . History of pancreatitis    pt denies this hx on 11/02/2013  . Hyperlipidemia   . Hypertension   . Intrinsic asthma 06/18/2007   Qualifier: Diagnosis of  By: Dance CMA ([place]), Kim    . Ischemic cardiomyopathy 09/16/2012   EF 25%- improved to 45% 3/14  . Myocardial infarction ([place]) 1997  . Obstructed, uropathy   . PVD (peripheral vascular disease) ([facility])    moderate bilat carotid disease  . Syncope and collapse 11/02/2013   "didn't pass out" (11/02/2013)  . Type II diabetes mellitus ([place])     Past Surgical History:  Procedure Laterality Date  . CATARACT EXTRACTION W/ INTRAOCULAR LENS  IMPLANT, BILATERAL Bilateral ~ 1970  . CHOLECYSTECTOMY  2000's  . CORONARY ANGIOPLASTY WITH STENT PLACEMENT  Dec 2013   DES to SVG-CFX/PDA  . CORONARY ARTERY BYPASS GRAFT  1997   "CABG X 5"  . INGUINAL HERNIA REPAIR Bilateral ~ 1950  . LEFT HEART CATHETERIZATION WITH CORONARY/GRAFT ANGIOGRAM N/A 09/16/2012   Procedure: LEFT HEART CATHETERIZATION WITH Beatrix Fetters;  Surgeon: Lorretta Harp, MD;   Location: [facility] CATH LAB;  Service: Cardiovascular;  Laterality: N/A;  . NASAL SEPTUM SURGERY  2007  . RETINAL DETACHMENT SURGERY Left ?1995  . SHOULDER OPEN ROTATOR CUFF REPAIR Right     Social History   Social History  . Marital status: Married    Spouse name: N/A  . Number of children: 1  . Years of education: N/A   Occupational History  . 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 Smoking status: Never Smoker  . Smokeless tobacco: Never Used     Comment: 11/02/2013 "smoked cigars when I was 18 or so; didn't smoke many"  . Alcohol use 1.2 oz/week    2 Glasses of wine per week  . Drug use: No  . Sexual activity: No   Other Topics Concern  . 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 Always use your most recent med list.          acetaminophen 500 MG tablet Commonly known as:  TYLENOL Take 1,000 mg by mouth every 6 (six) hours as needed for headache (pain).   Adjustable Lancing Device Misc Reported on 04/30/2016   albuterol 108 (90 Base) MCG/ACT inhaler Commonly known as:  PROAIR HFA Inhale 2 puffs into the lungs every 6 (six) hours as needed for wheezing or shortness of breath.   Alcohol Prep 70 % Pads Reported on 04/30/2016   aspirin 81 MG EC tablet Take 2 tablets (162 mg total) by mouth daily.   azelastine 0.1 % nasal spray Commonly known as:  ASTELIN Place 2 sprays into both nostrils at bedtime. Use in each nostril as directed   BASAGLAR KWIKPEN 100 UNIT/ML Sopn Inject 0.17 mLs  (17 Units total) into the skin daily at 10 pm.   blood glucose meter kit and supplies Use as instructed   budesonide-formoterol 160-4.5 MCG/ACT inhaler Commonly known as:  SYMBICORT Inhale 2 puffs into the lungs 2 (two) times daily.   clopidogrel 75 MG tablet Commonly known as:  PLAVIX TAKE 1 TABLET DAILY WITH   BREAKFAST   doxazosin 2 MG tablet Commonly known as:  CARDURA Take 2 mg by mouth at bedtime.   ezetimibe 10 MG tablet Commonly known as:  ZETIA Take 1 tablet (10 mg total) by mouth daily.   finasteride 5 MG tablet Commonly known as:  PROSCAR Take 5 mg by mouth daily.   furosemide 40 MG tablet Commonly known as:  LASIX TAKE 1 TABLET BY MOUTH EVERY DAY   glucose blood test strip Use to test blood sugars twice daily   Grape Seed 100 MG Caps Take 200 mg by mouth daily.   Insulin Pen Needle 32G X 4 MM Misc To use w/ Basaglar   isosorbide mononitrate 30 MG 24 hr tablet Commonly known as:  IMDUR Take 0.5 tablets (15 mg total) by mouth daily.   lisinopril 2.5 MG tablet Commonly known as:  PRINIVIL,ZESTRIL Take 1 tablet (2.5 mg total) by mouth daily.   metFORMIN 850 MG tablet Commonly known as:  GLUCOPHAGE Take 1.5 tablets (1,275 mg total) by mouth 2 (two) times daily with a meal.   metoprolol tartrate 25 MG tablet Commonly known as:  LOPRESSOR Take 0.5 tablets (12.5 mg total) by mouth 2 (two) times daily.   pantoprazole 40 MG tablet Commonly known as:  PROTONIX TAKE 1 TABLET DAILY   rosuvastatin 10 MG tablet Commonly known as:  CRESTOR TAKE 1 TABLET DAILY   SYSTANE BALANCE OP Place 1 drop into both eyes daily as needed (dry eyes).            Discharge Care Instructions        Start     Ordered   07/02/17 0000  Flu vaccine HIGH DOSE PF     07/02/17 1146   07/02/17 0000  budesonide-formoterol (SYMBICORT) 160-4.5 MCG/ACT inhaler  2 times daily     07/02/17 1211   07/02/17 0000  Comp Met (CMET)     07/02/17 1215   07/02/17 0000  Lipid panel      07/02/17 1215   07/02/17 0000  CBC w/Diff     07/02/17 1215   07/02/17 0000  Hemoglobin A1c     07/02/17 1215         Objective:   Physical Exam  BP (!) 104/57 (BP Location: Left Arm, Cuff Size: Normal)   Pulse 66   Temp 97.8 F (36.6 C) (Oral)   Wt 169 lb 6.4 oz (76.8 kg)   SpO2 96%   BMI 26.14 kg/m  General:   Well developed, well nourished . NAD.  HEENT:  Normocephalic . Face symmetric, atraumatic Lungs:  Slightly decreased breath sounds with mild wheezing bilaterally. No increased work of breathing Normal respiratory effort, no intercostal retractions, no accessory muscle use. Heart: RRR,  no murmur.  No pretibial edema bilaterally  Skin: Not pale. Not jaundice Neurologic:  alert & oriented X3.  Speech normal, gait appropriate for age and unassisted Psych--  Cognition and judgment appear intact.  Cooperative with normal attention span and concentration.  Behavior appropriate. No anxious or depressed appearing.      Assessment & Plan:   Assessment Diabetes, no neuropathy as of 10-2015 10-2015: d/c Lantus but had to go back on it 10-2015 , dc Actos (dt CHF) and started metformin Hyperlipidemia Hypertension CV: --CAD, CABG 6294 --CHF: Systolic, diastolic, EF echocardiogram 2014---45% --Peripheral vascular disease Pulmonary -OSA: On CPAP -asthma -  PFTs 06-2017: Obstructive and restrictive disease with + response to bronchodilators Hearing loss   PLAN DM: Continue metformin and Lantus, check up A1c HTN: Seems well-controlled on Cardura, Lasix, lisinopril, metoprolol. Check a CMP and CBC DOE: She still has some DOB and cough. Seen by the CHF clinic, stable from the cardiac standpoint, PFTs showed obstructive and restrictive disease with good response to bronchodilators. The patient was never a smoker. Currently on Advair and albuterol as needed. Still wheezing slightly today. Overall patient feels that he is doing okay, nevertheless would like to see if his  breathing could improve. Plan: Switch Advair to Symbicort. Inhalers technique discussed. If not better, consider pulmonary referral RTC 3 months

## 2017-07-02 NOTE — Patient Instructions (Signed)
  GO TO THE FRONT DESK Schedule fasting labs to be done this week  Schedule your next appointment for a  checkup in 3 months  Stop Advair  Start Symbicort 2 puffs twice a day  Let me know next month is that is helping your breathing

## 2017-07-03 ENCOUNTER — Other Ambulatory Visit (INDEPENDENT_AMBULATORY_CARE_PROVIDER_SITE_OTHER): Payer: Medicare Other

## 2017-07-03 DIAGNOSIS — Z23 Encounter for immunization: Secondary | ICD-10-CM

## 2017-07-03 DIAGNOSIS — E1159 Type 2 diabetes mellitus with other circulatory complications: Secondary | ICD-10-CM | POA: Diagnosis not present

## 2017-07-03 DIAGNOSIS — E7800 Pure hypercholesterolemia, unspecified: Secondary | ICD-10-CM

## 2017-07-03 DIAGNOSIS — I10 Essential (primary) hypertension: Secondary | ICD-10-CM

## 2017-07-03 DIAGNOSIS — Z794 Long term (current) use of insulin: Secondary | ICD-10-CM | POA: Diagnosis not present

## 2017-07-03 LAB — COMPREHENSIVE METABOLIC PANEL
ALBUMIN: 4.2 g/dL (ref 3.5–5.2)
ALK PHOS: 51 U/L (ref 39–117)
ALT: 11 U/L (ref 0–53)
AST: 14 U/L (ref 0–37)
BILIRUBIN TOTAL: 0.7 mg/dL (ref 0.2–1.2)
BUN: 21 mg/dL (ref 6–23)
CALCIUM: 9.4 mg/dL (ref 8.4–10.5)
CO2: 32 mEq/L (ref 19–32)
Chloride: 96 mEq/L (ref 96–112)
Creatinine, Ser: 0.9 mg/dL (ref 0.40–1.50)
GFR: 84.39 mL/min (ref >60.00)
Glucose, Bld: 78 mg/dL (ref 70–99)
Potassium: 4.2 mEq/L (ref 3.5–5.1)
Sodium: 135 mEq/L (ref 135–145)
TOTAL PROTEIN: 6.7 g/dL (ref 6.0–8.3)

## 2017-07-03 LAB — LIPID PANEL
CHOLESTEROL: 104 mg/dL (ref 0–200)
HDL: 52.3 mg/dL (ref >39.00)
LDL Cholesterol: 41 mg/dL (ref 0–99)
NonHDL: 51.88
TRIGLYCERIDES: 54 mg/dL (ref 0.0–149.0)
Total CHOL/HDL Ratio: 2
VLDL: 10.8 mg/dL (ref 0.0–40.0)

## 2017-07-03 LAB — CBC WITH DIFFERENTIAL/PLATELET
Basophils Absolute: 0.1 10*3/uL (ref 0.0–0.1)
Basophils Relative: 1.2 % (ref 0.0–3.0)
Eosinophils Absolute: 1.7 10*3/uL — ABNORMAL HIGH (ref 0.0–0.7)
Eosinophils Relative: 19.1 % — ABNORMAL HIGH (ref 0.0–5.0)
HEMATOCRIT: 39 % (ref 39.0–52.0)
Hemoglobin: 13.4 g/dL (ref 13.0–17.0)
LYMPHS ABS: 1.3 10*3/uL (ref 0.7–4.0)
Lymphocytes Relative: 14.5 % (ref 12.0–46.0)
MCHC: 34.4 g/dL (ref 30.0–36.0)
MCV: 92.4 fl (ref 78.0–100.0)
MONOS PCT: 13.1 % — AB (ref 3.0–12.0)
Monocytes Absolute: 1.2 10*3/uL — ABNORMAL HIGH (ref 0.1–1.0)
NEUTROS ABS: 4.7 10*3/uL (ref 1.4–7.7)
NEUTROS PCT: 52.1 % (ref 43.0–77.0)
PLATELETS: 227 10*3/uL (ref 150.0–400.0)
RBC: 4.22 Mil/uL (ref 4.22–5.81)
RDW: 13.5 % (ref 11.5–15.5)
WBC: 9.1 10*3/uL (ref 4.0–10.5)

## 2017-07-03 LAB — HEMOGLOBIN A1C: HEMOGLOBIN A1C: 7 % — AB (ref 4.6–6.5)

## 2017-07-03 NOTE — Assessment & Plan Note (Signed)
DM: Continue metformin and Lantus, check up A1c HTN: Seems well-controlled on Cardura, Lasix, lisinopril, metoprolol. Check a CMP and CBC DOE: She still has some DOB and cough. Seen by the CHF clinic, stable from the cardiac standpoint, PFTs showed obstructive and restrictive disease with good response to bronchodilators. The patient was never a smoker. Currently on Advair and albuterol as needed. Still wheezing slightly today. Overall patient feels that he is doing okay, nevertheless would like to see if his breathing could improve. Plan: Switch Advair to Symbicort. Inhalers technique discussed. If not better, consider pulmonary referral RTC 3 months

## 2017-08-08 ENCOUNTER — Encounter: Payer: Self-pay | Admitting: Internal Medicine

## 2017-08-09 MED ORDER — FLUTICASONE-SALMETEROL 100-50 MCG/DOSE IN AEPB: 1.0000 | INHALATION_SPRAY | Freq: Two times a day (BID) | RESPIRATORY_TRACT | 1 refills | Status: AC

## 2017-08-09 NOTE — Telephone Encounter (Signed)
See message  Received: Today  Message Contents  Wanda Plump, MD  Conrad Montrose, CMA        DC Symbicort  Send a prescription for Advair, same as before 6 months

## 2017-08-09 NOTE — Telephone Encounter (Signed)
Rx sent 

## 2017-08-23 ENCOUNTER — Other Ambulatory Visit: Payer: Self-pay | Admitting: Internal Medicine

## 2017-08-26 ENCOUNTER — Other Ambulatory Visit: Payer: Self-pay | Admitting: Cardiovascular Disease

## 2017-08-26 NOTE — Telephone Encounter (Signed)
Rx has been sent to the pharmacy electronically. ° °

## 2017-09-19 LAB — HM DIABETES EYE EXAM

## 2017-09-23 ENCOUNTER — Other Ambulatory Visit: Payer: Self-pay | Admitting: Internal Medicine

## 2017-10-01 ENCOUNTER — Ambulatory Visit (INDEPENDENT_AMBULATORY_CARE_PROVIDER_SITE_OTHER): Payer: Medicare Other | Admitting: Internal Medicine

## 2017-10-01 ENCOUNTER — Encounter: Payer: Self-pay | Admitting: Internal Medicine

## 2017-10-01 VITALS — BP 112/58 | HR 66 | Temp 98.1°F | Resp 14 | Ht 67.5 in | Wt 176.4 lb

## 2017-10-01 DIAGNOSIS — I255 Ischemic cardiomyopathy: Secondary | ICD-10-CM

## 2017-10-01 DIAGNOSIS — Z23 Encounter for immunization: Secondary | ICD-10-CM

## 2017-10-01 DIAGNOSIS — I10 Essential (primary) hypertension: Secondary | ICD-10-CM | POA: Diagnosis not present

## 2017-10-01 DIAGNOSIS — Z794 Long term (current) use of insulin: Secondary | ICD-10-CM

## 2017-10-01 DIAGNOSIS — E1159 Type 2 diabetes mellitus with other circulatory complications: Secondary | ICD-10-CM

## 2017-10-01 DIAGNOSIS — R0609 Other forms of dyspnea: Secondary | ICD-10-CM

## 2017-10-01 DIAGNOSIS — Z09 Encounter for follow-up examination after completed treatment for conditions other than malignant neoplasm: Secondary | ICD-10-CM

## 2017-10-01 LAB — HEMOGLOBIN A1C: HEMOGLOBIN A1C: 7.2 % — AB (ref 4.6–6.5)

## 2017-10-01 NOTE — Progress Notes (Addendum)
Subjective:    Patient ID: [name]., male    DOB: 1926-07-31, 83 y.o.   MRN: [id number]  DOS:  10/01/2017 Type of visit - description : rov Interval history: DM: Good compliance of medication, ambulatory CBGs are checked once daily, at night, postprandial, they run from 170-210. Was seen with DOE the last time, inhalers were changed but he ended up going back to his original inhaler.  Currently doing well.   Review of Systems  On aspirin and Plavix, good compliance and tolerance, denies any blood in the stools or in the urine. Denies chest pain, difficulty breathing. Again DOE is at baseline and not severe.  He is able to do all his ADLs.  Past Medical History:  Diagnosis Date  . Acute CHF ([place]) 09/15/2012  . Asthma    pt denies this hx on 11/02/2013  . BPH (benign prostatic hyperplasia)   . CAD (coronary artery disease)    CABG '97, SVG DES 12/13  . Colonic polyp   . Exertional shortness of breath   . GERD (gastroesophageal reflux disease)   . Hearing loss   . History of pancreatitis    pt denies this hx on 11/02/2013  . Hyperlipidemia   . Hypertension   . Intrinsic asthma 06/18/2007   Qualifier: Diagnosis of  By: Dance CMA ([place]), Kim    . Ischemic cardiomyopathy 09/16/2012   EF 25%- improved to 45% 3/14  . Myocardial infarction ([place]) 1997  . Obstructed, uropathy   . PVD (peripheral vascular disease) ([facility])    moderate bilat carotid disease  . Syncope and collapse 11/02/2013   "didn't pass out" (11/02/2013)  . Type II diabetes mellitus ([place])     Past Surgical History:  Procedure Laterality Date  . CATARACT EXTRACTION W/ INTRAOCULAR LENS  IMPLANT, BILATERAL Bilateral ~ 1970  . CHOLECYSTECTOMY  2000's  . CORONARY ANGIOPLASTY WITH STENT PLACEMENT  Dec 2013   DES to SVG-CFX/PDA  . CORONARY ARTERY BYPASS GRAFT  1997   "CABG X 5"  . INGUINAL HERNIA REPAIR Bilateral ~ 1950  . LEFT HEART CATHETERIZATION WITH CORONARY/GRAFT ANGIOGRAM N/A 09/16/2012   Procedure: LEFT  HEART CATHETERIZATION WITH Beatrix Fetters;  Surgeon: Lorretta Harp, MD;  Location: [facility] CATH LAB;  Service: Cardiovascular;  Laterality: N/A;  . NASAL SEPTUM SURGERY  2007  . RETINAL DETACHMENT SURGERY Left ?1995  . SHOULDER OPEN ROTATOR CUFF REPAIR Right     Social History   Socioeconomic History  . Marital status: Married    Spouse name: Not on file  . Number of children: 1  . Years of education: Not on file  . Highest education level: Not on file  Social Needs  . Financial resource strain: Not on file  . Food insecurity - worry: Not on file  . Food insecurity - inability: Not on file  . Transportation needs - medical: Not on file  . Transportation needs - non-medical: Not on file  Occupational History  . 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 Smoking status: Never Smoker  . Smokeless tobacco: Never Used  . Tobacco comment: 11/02/2013 "smoked cigars when I was 18 or so; didn't smoke many"  Substance and Sexual Activity  . Alcohol use: Yes    Alcohol/week: 1.2 oz    Types: 2 Glasses of wine per week  . Drug use: No  . Sexual activity: No  Other Topics Concern  . 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 Always use your most recent med list.          acetaminophen 500 MG tablet Commonly known as:  TYLENOL Take 1,000 mg by mouth every 6 (six) hours as needed for headache (pain).   Adjustable Lancing Device Misc Reported on 04/30/2016   albuterol 108 (90 Base) MCG/ACT  inhaler Commonly known as:  PROAIR HFA Inhale 2 puffs into the lungs every 6 (six) hours as needed for wheezing or shortness of breath.   Alcohol Prep 70 % Pads Reported on 04/30/2016   aspirin 81 MG EC tablet Take 2 tablets (162 mg total) by mouth daily.   azelastine 0.1 % nasal spray Commonly known as:  ASTELIN Place 2 sprays into both nostrils at bedtime. Use in each nostril as directed   BASAGLAR KWIKPEN 100 UNIT/ML Sopn INJECT 0.17 MLS (17 UNITS TOTAL) INTO THE SKIN DAILY AT 10 PM.   blood glucose meter kit and supplies Use as instructed   clopidogrel 75 MG tablet Commonly known as:  PLAVIX TAKE 1 TABLET DAILY WITH   BREAKFAST   doxazosin 2 MG tablet Commonly known as:  CARDURA Take 2 mg by mouth at bedtime.   ezetimibe 10 MG tablet Commonly known as:  ZETIA TAKE 1 TABLET DAILY   finasteride 5 MG tablet Commonly known as:  PROSCAR Take 5 mg by mouth daily.   Fluticasone-Salmeterol 100-50 MCG/DOSE Aepb Commonly known as:  ADVAIR Inhale 1 puff into the lungs 2 (two) times daily.   furosemide 40 MG tablet Commonly known as:  LASIX TAKE 1 TABLET BY MOUTH EVERY DAY   glucose blood test strip Use to test blood sugars twice daily   Grape Seed 100 MG Caps Take 200 mg by mouth daily.   Insulin Pen Needle 32G X 4 MM Misc To use w/ Basaglar   isosorbide mononitrate 30 MG 24 hr tablet Commonly known as:  IMDUR Take 0.5 tablets (15 mg total) by mouth daily.   lisinopril 2.5 MG tablet Commonly known as:  PRINIVIL,ZESTRIL Take 1 tablet (2.5 mg total) by mouth daily.   metFORMIN 850 MG tablet Commonly known as:  GLUCOPHAGE TAKE 1.5 TABLETS (1,275 MG TOTAL) BY MOUTH 2 (TWO) TIMES DAILY WITH A MEAL.   metoprolol tartrate 25 MG tablet Commonly known as:  LOPRESSOR TAKE 1/2 TABLET (=12.5MG   TOTAL) TWO TIMES A DAY   pantoprazole 40 MG tablet Commonly known as:  PROTONIX TAKE 1 TABLET DAILY   rosuvastatin 10 MG tablet Commonly known as:  CRESTOR TAKE 1 TABLET  DAILY   SYSTANE BALANCE OP Place 1 drop into both eyes daily as needed (dry eyes).          Objective:   Physical Exam BP (!) 112/58 (BP Location: Left Arm, Patient Position: Sitting, Cuff Size: Small)   Pulse 66   Temp 98.1 F (36.7 C) (Oral)   Resp 14  Ht 5' 7.5" (1.715 m)   Wt 176 lb 6 oz (80 kg)   SpO2 96%   BMI 27.22 kg/m  General:   Well developed, well nourished . NAD.  HEENT:  Normocephalic . Face symmetric, atraumatic Lungs:  Slightly decreased breath sounds but clear. Normal respiratory effort, no intercostal retractions, no accessory muscle use. Heart: RRR,  no murmur.  No pretibial edema bilaterally  Skin: Not pale. Not jaundice Neurologic:  alert & oriented X3.  Speech normal, gait appropriate for age and unassisted Psych--  Cognition and judgment appear intact.  Cooperative with normal attention span and concentration.  Behavior appropriate. No anxious or depressed appearing.      Assessment & Plan:   Assessment Diabetes, no neuropathy as of 10-2015 10-2015: d/c Lantus but had to go back on it 10-2015 , dc Actos (dt CHF) and started metformin Hyperlipidemia Hypertension CV: --CAD, CABG 7255 --CHF: Systolic, diastolic, EF echocardiogram 2014---45% --Peripheral vascular disease Pulmonary -OSA: On CPAP -asthma -  PFTs 06-2017: Obstructive and restrictive disease with + response to bronchodilators Hearing loss   PLAN DM: Currently on insulin   17 units daily, metformin.  CBGs at bedtime slightly high from 170-210.  Last A1c 7.0.  Satisfactory.  Plan: No change, check an A1c, last BMP satisfactory. HTN: Continue Cardura, Lasix, lisinopril, metoprolol.  Last BMP okay. DOE: See last visit, we change from Advair to symbicort but ended up going back to Advair.  Currently feeling well, minimal DOE, independent on all his ADLs.  No change. Pneumonia shot booster today RTC 4-5 months

## 2017-10-01 NOTE — Patient Instructions (Signed)
GO TO THE LAB : Get the blood work     GO TO THE FRONT DESK Schedule your next appointment for a  checkup in 4-5 months   

## 2017-10-01 NOTE — Progress Notes (Signed)
Pre visit review using our clinic review tool, if applicable. No additional management support is needed unless otherwise documented below in the visit note. 

## 2017-10-02 NOTE — Assessment & Plan Note (Addendum)
DM: Currently on insulin   17 units daily, metformin.  CBGs at bedtime slightly high from 170-210.  Last A1c 7.0.  Satisfactory.  Plan: No change, check an A1c, last BMP satisfactory. HTN: Continue Cardura, Lasix, lisinopril, metoprolol.  Last BMP okay. DOE: See last visit, we change from Advair to symbicort but ended up going back to Advair.  Currently feeling well, minimal DOE, independent on all his ADLs.  No change. Pneumonia shot booster today RTC 4-5 months

## 2017-10-22 ENCOUNTER — Other Ambulatory Visit: Payer: Self-pay | Admitting: Internal Medicine

## 2017-11-18 ENCOUNTER — Other Ambulatory Visit: Payer: Self-pay | Admitting: Cardiovascular Disease

## 2017-11-18 NOTE — Telephone Encounter (Signed)
Rx request sent to pharmacy.  

## 2017-12-06 ENCOUNTER — Other Ambulatory Visit: Payer: Self-pay | Admitting: Internal Medicine

## 2017-12-07 ENCOUNTER — Other Ambulatory Visit: Payer: Self-pay | Admitting: Internal Medicine

## 2017-12-20 ENCOUNTER — Other Ambulatory Visit: Payer: Self-pay | Admitting: Internal Medicine

## 2018-01-28 ENCOUNTER — Ambulatory Visit: Payer: Medicare Other | Admitting: Internal Medicine

## 2018-02-03 ENCOUNTER — Other Ambulatory Visit: Payer: Self-pay

## 2018-02-03 ENCOUNTER — Encounter: Payer: Self-pay | Admitting: Internal Medicine

## 2018-02-04 ENCOUNTER — Encounter: Payer: Self-pay | Admitting: Internal Medicine

## 2018-02-04 ENCOUNTER — Ambulatory Visit (INDEPENDENT_AMBULATORY_CARE_PROVIDER_SITE_OTHER): Payer: Medicare Other | Admitting: Internal Medicine

## 2018-02-04 VITALS — BP 84/38 | HR 73 | Resp 16 | Ht 67.5 in | Wt 178.0 lb

## 2018-02-04 DIAGNOSIS — I10 Essential (primary) hypertension: Secondary | ICD-10-CM

## 2018-02-04 DIAGNOSIS — Z794 Long term (current) use of insulin: Secondary | ICD-10-CM

## 2018-02-04 DIAGNOSIS — E119 Type 2 diabetes mellitus without complications: Secondary | ICD-10-CM | POA: Diagnosis not present

## 2018-02-04 DIAGNOSIS — I2510 Atherosclerotic heart disease of native coronary artery without angina pectoris: Secondary | ICD-10-CM

## 2018-02-04 LAB — CBC WITH DIFFERENTIAL/PLATELET
BASOS ABS: 0.1 10*3/uL (ref 0.0–0.1)
Basophils Relative: 1.2 % (ref 0.0–3.0)
EOS PCT: 5.8 % — AB (ref 0.0–5.0)
Eosinophils Absolute: 0.6 10*3/uL (ref 0.0–0.7)
HCT: 36 % — ABNORMAL LOW (ref 39.0–52.0)
Hemoglobin: 12.4 g/dL — ABNORMAL LOW (ref 13.0–17.0)
LYMPHS ABS: 1.2 10*3/uL (ref 0.7–4.0)
Lymphocytes Relative: 10.6 % — ABNORMAL LOW (ref 12.0–46.0)
MCHC: 34.5 g/dL (ref 30.0–36.0)
MCV: 92.1 fl (ref 78.0–100.0)
MONO ABS: 1.1 10*3/uL — AB (ref 0.1–1.0)
MONOS PCT: 10.4 % (ref 3.0–12.0)
NEUTROS PCT: 72 % (ref 43.0–77.0)
Neutro Abs: 7.9 10*3/uL — ABNORMAL HIGH (ref 1.4–7.7)
Platelets: 235 10*3/uL (ref 150.0–400.0)
RBC: 3.9 Mil/uL — AB (ref 4.22–5.81)
RDW: 13.4 % (ref 11.5–15.5)
WBC: 11 10*3/uL — ABNORMAL HIGH (ref 4.0–10.5)

## 2018-02-04 LAB — BASIC METABOLIC PANEL
BUN: 16 mg/dL (ref 6–23)
CALCIUM: 9 mg/dL (ref 8.4–10.5)
CO2: 31 mEq/L (ref 19–32)
Chloride: 97 mEq/L (ref 96–112)
Creatinine, Ser: 1.12 mg/dL (ref 0.40–1.50)
GFR: 65.48 mL/min (ref >60.00)
Glucose, Bld: 127 mg/dL — ABNORMAL HIGH (ref 70–99)
POTASSIUM: 4.1 meq/L (ref 3.5–5.1)
SODIUM: 135 meq/L (ref 135–145)

## 2018-02-04 LAB — HEMOGLOBIN A1C: HEMOGLOBIN A1C: 6.9 % — AB (ref 4.6–6.5)

## 2018-02-04 MED ORDER — NITROGLYCERIN 0.4 MG SL SUBL: 0.4000 mg | SUBLINGUAL_TABLET | SUBLINGUAL | 1 refills | Status: AC | PRN

## 2018-02-04 NOTE — Progress Notes (Signed)
Subjective:    Patient ID: [name]., male    DOB: 11/19/1925, 84 y.o.   MRN: [id number]  DOS:  02/04/2018 Type of visit - description : rov Interval history: General feeling well. HTN: Good compliance of medication, ambulatory BP was low when he arrived to the office.  He has no orthostatic sx, feels at baseline DM: Good compliance with medication, ambulatory CBGs in the 200s Asthma: Using inhalers as needed.  No symptoms   Review of Systems Denies chest pain or difficulty breathing No nausea, vomiting, diarrhea. Has a mild headache.  Past Medical History:  Diagnosis Date  . Acute CHF ([place]) 09/15/2012  . Asthma    pt denies this hx on 11/02/2013  . BPH (benign prostatic hyperplasia)   . CAD (coronary artery disease)    CABG '97, SVG DES 12/13  . Colonic polyp   . Exertional shortness of breath   . GERD (gastroesophageal reflux disease)   . Hearing loss   . History of pancreatitis    pt denies this hx on 11/02/2013  . Hyperlipidemia   . Hypertension   . Intrinsic asthma 06/18/2007   Qualifier: Diagnosis of  By: Dance CMA ([place]), Kim    . Ischemic cardiomyopathy 09/16/2012   EF 25%- improved to 45% 3/14  . Myocardial infarction ([place]) 1997  . Obstructed, uropathy   . PVD (peripheral vascular disease) ([facility])    moderate bilat carotid disease  . Syncope and collapse 11/02/2013   "didn't pass out" (11/02/2013)  . Type II diabetes mellitus ([place])     Past Surgical History:  Procedure Laterality Date  . CATARACT EXTRACTION W/ INTRAOCULAR LENS  IMPLANT, BILATERAL Bilateral ~ 1970  . CHOLECYSTECTOMY  2000's  . CORONARY ANGIOPLASTY WITH STENT PLACEMENT  Dec 2013   DES to SVG-CFX/PDA  . CORONARY ARTERY BYPASS GRAFT  1997   "CABG X 5"  . INGUINAL HERNIA REPAIR Bilateral ~ 1950  . LEFT HEART CATHETERIZATION WITH CORONARY/GRAFT ANGIOGRAM N/A 09/16/2012   Procedure: LEFT HEART CATHETERIZATION WITH Beatrix Fetters;  Surgeon: Lorretta Harp, MD;  Location: [facility] CATH  LAB;  Service: Cardiovascular;  Laterality: N/A;  . NASAL SEPTUM SURGERY  2007  . RETINAL DETACHMENT SURGERY Left ?1995  . SHOULDER OPEN ROTATOR CUFF REPAIR Right     Social History   Socioeconomic History  . Marital status: Married    Spouse name: Not on file  . Number of children: 1  . Years of education: Not on file  . Highest education level: Not on file  Occupational History  . 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 Financial resource strain: Not on file  . Food insecurity:    Worry: Not on file    Inability: Not on file  . Transportation needs:    Medical: Not on file    Non-medical: Not on file  Tobacco Use  . Smoking status: Never Smoker  . Smokeless tobacco: Never Used  . Tobacco comment: 11/02/2013 "smoked cigars when I was 18 or so; didn't smoke many"  Substance and Sexual Activity  . Alcohol use: Yes    Alcohol/week: 1.2 oz    Types: 2 Glasses of wine per week  . Drug use: No  . Sexual activity: Never  Lifestyle  . Physical activity:    Days per week: Not on file    Minutes per session: Not on file  . Stress: Not on file  Relationships  . Social connections:  Talks on phone: Not on file    Gets together: Not on file    Attends religious service: Not on file    Active member of club or organization: Not on file    Attends meetings of clubs or organizations: Not on file    Relationship status: Not on file  . Intimate partner violence:    Fear of current or ex partner: Not on file    Emotionally abused: Not on file    Physically abused: Not on file    Forced sexual activity: Not on file  Other Topics Concern  . 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 Always use your most recent med list.          acetaminophen 500 MG tablet Commonly known as:  TYLENOL Take 1,000 mg by mouth every 6 (six) hours as needed for headache (pain).   Adjustable Lancing Device Misc Reported on 04/30/2016   albuterol 108 (90 Base) MCG/ACT inhaler Commonly known as:  PROAIR HFA Inhale 2 puffs into the lungs every 6 (six) hours as needed for wheezing or shortness of breath.   Alcohol Prep 70 % Pads Reported on 04/30/2016   aspirin 81 MG EC tablet Take 2 tablets (162 mg total) by mouth daily.   azelastine 0.1 % nasal spray Commonly known as:  ASTELIN Place 2 sprays into both nostrils at bedtime. Use in each nostril as directed   BASAGLAR KWIKPEN 100 UNIT/ML Sopn Inject 0.17 mLs (17 Units total) into the skin at bedtime.   blood glucose meter kit and supplies Use as instructed   clopidogrel 75 MG tablet Commonly known as:  PLAVIX TAKE 1 TABLET DAILY WITH   BREAKFAST   ezetimibe 10 MG tablet Commonly known as:  ZETIA TAKE 1 TABLET DAILY   finasteride 5 MG tablet Commonly known as:  PROSCAR Take 5 mg by mouth daily.   Fluticasone-Salmeterol 100-50 MCG/DOSE Aepb Commonly known as:  ADVAIR Inhale 1 puff into the lungs 2 (two) times daily.   furosemide 40 MG tablet Commonly known as:  LASIX TAKE 1 TABLET BY MOUTH EVERY DAY   glucose blood test strip Use to test blood sugars twice daily   Grape Seed 100 MG Caps Take 200 mg by mouth daily.   Insulin Pen Needle 32G X 4 MM Misc To use w/ Basaglar   isosorbide mononitrate 30 MG 24 hr tablet Commonly known as:  IMDUR Take 0.5 tablets (15 mg total) by mouth daily.   lisinopril 2.5 MG tablet Commonly known as:  PRINIVIL,ZESTRIL Take 1 tablet (2.5 mg total) by mouth daily.   metFORMIN 850 MG  tablet Commonly known as:  GLUCOPHAGE Take 1.5 tablets by mouth twice daily with meal   metoprolol tartrate 25 MG tablet Commonly known as:  LOPRESSOR TAKE 1/2 TABLET (=12.5MG   TOTAL) TWO TIMES A DAY   mometasone 50 MCG/ACT nasal spray Commonly  known as:  NASONEX Place 2 sprays into the nose daily.   nitroGLYCERIN 0.4 MG SL tablet Commonly known as:  NITROSTAT Place 1 tablet (0.4 mg total) under the tongue every 5 (five) minutes x 3 doses as needed for chest pain.   pantoprazole 40 MG tablet Commonly known as:  PROTONIX TAKE 1 TABLET DAILY   rosuvastatin 10 MG tablet Commonly known as:  CRESTOR TAKE 1 TABLET DAILY   SYSTANE BALANCE OP Place 1 drop into both eyes daily as needed (dry eyes).          Objective:   Physical Exam BP (!) 84/38 (BP Location: Left Arm, Patient Position: Sitting, Cuff Size: Normal)   Pulse 73   Resp 16   Ht 5' 7.5" (1.715 m)   Wt 178 lb (80.7 kg)   SpO2 98%   BMI 27.47 kg/m  General:   Well developed, well nourished . NAD.  HEENT:  Normocephalic . Face symmetric, atraumatic Lungs:  CTA B Normal respiratory effort, no intercostal retractions, no accessory muscle use. Heart: RRR,  no murmur.  No pretibial edema bilaterally Radial pulses symmetric bilaterally. Skin: Not pale. Not jaundice Neurologic:  alert & oriented X3.  Speech normal, gait appropriate for age and unassisted Psych--  Cognition and judgment appear intact.  Cooperative with normal attention span and concentration.  Behavior appropriate. No anxious or depressed appearing.      Assessment & Plan:   Assessment Diabetes, no neuropathy as of 10-2015 10-2015: d/c Lantus but had to go back on it 10-2015 , dc Actos (dt CHF) and started metformin Hyperlipidemia Hypertension CV: --CAD, CABG 7943 --CHF: Systolic, diastolic, EF echocardiogram 2014---45% --Peripheral vascular disease Pulmonary -OSA: On CPAP -asthma -  PFTs 06-2017: Obstructive and restrictive disease  with + response to bronchodilators Hearing loss   PLAN DM: On insulin 17 units daily, metformin.  CBGs are checked usually at bedtime: 215, 230.  Check a A1c.  Adjust medication if needed. HTN: BP when he arrived to the office was 84/38.  He has no symptoms. I rechecked manually: Right arm: 85 445 Left arm 100/55. Mild discordance, consider vascular eval. Plan:  Stop Cardura, decrease Lasix 35m to 0.5 tablets daily, continue lisinopril, metoprolol.  Check ambulatory BPs and come back to the office in 3 days.  See AVS CAD: Asymptomatic, RF nitroglycerin Mild headache: Reassess on Friday when she comes back for a BP check Asthma : Uses inhalers as needed RTC 4 months

## 2018-02-04 NOTE — Patient Instructions (Addendum)
GO TO THE LAB : Get the blood work     GO TO THE FRONT DESK Schedule your next appointment for a blood pressure check with one of our nurses for Friday, 02/07/2018  Schedule next visit with me in 4 months  =====  Blood pressure is  slightly low. Drink plenty fluids Call if you feeling very weak, dizzy particularly when you stand up. Stop Cardura (doxazosin) Furosemide: Half tablet a day Check your blood pressures daily

## 2018-02-05 ENCOUNTER — Encounter: Payer: Self-pay | Admitting: Internal Medicine

## 2018-02-05 NOTE — Assessment & Plan Note (Signed)
DM: On insulin 17 units daily, metformin.  CBGs are checked usually at bedtime: 215, 230.  Check a A1c.  Adjust medication if needed. HTN: BP when he arrived to the office was 84/38.  He has no symptoms. I rechecked manually: Right arm: 85 445 Left arm 100/55. Mild discordance, consider vascular eval. Plan:  Stop Cardura, decrease Lasix 40mg  to 0.5 tablets daily, continue lisinopril, metoprolol.  Check ambulatory BPs and come back to the office in 3 days.  See AVS CAD: Asymptomatic, RF nitroglycerin Mild headache: Reassess on Friday when she comes back for a BP check Asthma : Uses inhalers as needed RTC 4 months

## 2018-02-07 ENCOUNTER — Ambulatory Visit (INDEPENDENT_AMBULATORY_CARE_PROVIDER_SITE_OTHER): Payer: Medicare Other

## 2018-02-07 VITALS — BP 108/58 | HR 65

## 2018-02-07 DIAGNOSIS — I10 Essential (primary) hypertension: Secondary | ICD-10-CM | POA: Diagnosis not present

## 2018-02-07 MED ORDER — METFORMIN HCL 850 MG PO TABS: ORAL_TABLET | ORAL | 1 refills | Status: DC

## 2018-02-07 NOTE — Progress Notes (Addendum)
Patient came in today very pleasant and happy!, with his spouse for a blood pressure check.   Patient currently taking for blood pressure:  Lisinopril 2.5mg  daily Metoprolol tartrate 25mg  half a tablet in the am the other half at night Lasix 40mg : take 20 mg daily.    Missed doses: none  Last Taken medication: 6:30am today   Today:  108/58  Pulse: 65   Per Dr. Drue Novel: he will continue with his daily medications. No change to current meds. Dr. Drue Novel wants patient to call in about a week with recorded blood pressure readings.   Patient accompanied with his spouse was advised about message above, she states she is the one who manages his medications, she has been advised on his recommendations. She states she will send a myChart message with patients readings next week. She voiced her understanding.     Ryan Ora, MD

## 2018-02-17 ENCOUNTER — Encounter: Payer: Self-pay | Admitting: Internal Medicine

## 2018-03-04 ENCOUNTER — Telehealth: Payer: Self-pay | Admitting: Internal Medicine

## 2018-03-04 ENCOUNTER — Telehealth: Payer: Self-pay

## 2018-03-04 ENCOUNTER — Encounter: Payer: Self-pay | Admitting: Internal Medicine

## 2018-03-04 NOTE — Telephone Encounter (Signed)
Called patient. Spoke with wife who states patient has had change in breathing over the past 10 days upon exsertion. States patient refuses to go to ED. Denies Chest pain, cyanosis, wheezing. Wife wanted appoitnment scheduled for this  Thursday,advised he come in sooner since he is having a change in his breathing. Agreed to come in tomorrow 03/05/18. Appointment scheduled. Advised if patients breathing became worse with chest pain, he needed to be taken to ED asap. Wife agreed to do so.

## 2018-03-04 NOTE — Telephone Encounter (Signed)
Please see message, reports difficulty breathing.  Please triage the patient: If severe symptoms needs to go to the ER otherwise needs to be seen at this office in a timely manner.

## 2018-03-04 NOTE — Telephone Encounter (Signed)
thx

## 2018-03-05 ENCOUNTER — Encounter: Payer: Self-pay | Admitting: Internal Medicine

## 2018-03-05 ENCOUNTER — Ambulatory Visit (HOSPITAL_BASED_OUTPATIENT_CLINIC_OR_DEPARTMENT_OTHER)
Admission: RE | Admit: 2018-03-05 | Discharge: 2018-03-05 | Disposition: A | Discharge status: Home / Self Care | Payer: Medicare Other | Source: Ambulatory Visit | Attending: Internal Medicine | Admitting: Internal Medicine

## 2018-03-05 ENCOUNTER — Ambulatory Visit (INDEPENDENT_AMBULATORY_CARE_PROVIDER_SITE_OTHER): Payer: Medicare Other | Admitting: Internal Medicine

## 2018-03-05 ENCOUNTER — Telehealth: Payer: Self-pay | Admitting: Cardiovascular Disease

## 2018-03-05 VITALS — BP 126/66 | HR 68 | Temp 97.6°F | Resp 16 | Ht 67.5 in | Wt 179.1 lb

## 2018-03-05 DIAGNOSIS — J9811 Atelectasis: Secondary | ICD-10-CM | POA: Diagnosis not present

## 2018-03-05 DIAGNOSIS — I517 Cardiomegaly: Secondary | ICD-10-CM | POA: Insufficient documentation

## 2018-03-05 DIAGNOSIS — R0602 Shortness of breath: Secondary | ICD-10-CM | POA: Diagnosis not present

## 2018-03-05 DIAGNOSIS — Z951 Presence of aortocoronary bypass graft: Secondary | ICD-10-CM | POA: Diagnosis not present

## 2018-03-05 DIAGNOSIS — I2510 Atherosclerotic heart disease of native coronary artery without angina pectoris: Secondary | ICD-10-CM

## 2018-03-05 DIAGNOSIS — J90 Pleural effusion, not elsewhere classified: Secondary | ICD-10-CM | POA: Diagnosis not present

## 2018-03-05 DIAGNOSIS — R0609 Other forms of dyspnea: Secondary | ICD-10-CM | POA: Diagnosis not present

## 2018-03-05 DIAGNOSIS — I6522 Occlusion and stenosis of left carotid artery: Secondary | ICD-10-CM

## 2018-03-05 NOTE — Telephone Encounter (Signed)
DOD Call, Dr. Drue Novel spoke with Dr. Tresa Endo.

## 2018-03-05 NOTE — Progress Notes (Signed)
Pre visit review using our clinic review tool, if applicable. No additional management support is needed unless otherwise documented below in the visit note. 

## 2018-03-05 NOTE — Assessment & Plan Note (Signed)
DOE 84 year old gentleman with CAD, CHF, DM, hypertension presents with increased DOE from baseline. Recently -due to low BP- Lasix dose decreased. Exam is benign with no overt vol overload. EKG: T wave inversions noted on be 5 and V6, not present in the previous EKG but present on older EKGs. D/w cardiology, they recommend a echocardiogram and follow-up with primary cardiologist Plan: Chest x-ray, go back on full dose of Lasix with 40 mg daily (call if BP drops below 110-105), echocardiogram.  Will arrange a visit with cardiology ER if severe symptoms. Follow-up with me 1 month

## 2018-03-05 NOTE — Progress Notes (Signed)
Subjective:    Patient ID: Ryan Plumb., male    DOB: May 23, 1926, 84 y.o.   MRN: [id number]  DOS:  03/05/2018 Type of visit - description : Acute visit Interval history: Has chronic DOE, more noticeable in the last 2 weeks: Used to go to pick up the mail, take a shower and dressing without major problems but now after doing those things, he gets exhausted and short of breath.  2 to  3 minutes after he rests he goes back to normal.  No difficulty breathing at rest.  Wt Readings from Last 3 Encounters:  03/05/18 179 lb 2 oz (81.3 kg)  02/04/18 178 lb (80.7 kg)  10/01/17 176 lb 6 oz (80 kg)     Review of Systems  No chest pain or palpitations, has mild cough on and off, no wheezing. No lower extremity edema, no orthopnea, has been using 2 pillows to sleep for long time.  Past Medical History:  Diagnosis Date  . Acute CHF ([place]) 09/15/2012  . Asthma    pt denies this hx on 11/02/2013  . BPH (benign prostatic hyperplasia)   . CAD (coronary artery disease)    CABG '97, SVG DES 12/13  . Colonic polyp   . Exertional shortness of breath   . GERD (gastroesophageal reflux disease)   . Hearing loss   . History of pancreatitis    pt denies this hx on 11/02/2013  . Hyperlipidemia   . Hypertension   . Intrinsic asthma 06/18/2007   Qualifier: Diagnosis of  By: Dance CMA ([place]), Kim    . Ischemic cardiomyopathy 09/16/2012   EF 25%- improved to 45% 3/14  . Myocardial infarction ([place]) 1997  . Obstructed, uropathy   . PVD (peripheral vascular disease) ([facility])    moderate bilat carotid disease  . Syncope and collapse 11/02/2013   "didn't pass out" (11/02/2013)  . Type II diabetes mellitus ([place])     Past Surgical History:  Procedure Laterality Date  . CATARACT EXTRACTION W/ INTRAOCULAR LENS  IMPLANT, BILATERAL Bilateral ~ 1970  . CHOLECYSTECTOMY  2000's  . CORONARY ANGIOPLASTY WITH STENT PLACEMENT  Dec 2013   DES to SVG-CFX/PDA  . CORONARY ARTERY BYPASS GRAFT  1997   "CABG X 5"  .  INGUINAL HERNIA REPAIR Bilateral ~ 1950  . LEFT HEART CATHETERIZATION WITH CORONARY/GRAFT ANGIOGRAM N/A 09/16/2012   Procedure: LEFT HEART CATHETERIZATION WITH Beatrix Fetters;  Surgeon: Lorretta Harp, MD;  Location[facility] CATH LAB;  Service: Cardiovascular;  Laterality: N/A;  . NASAL SEPTUM SURGERY  2007  . RETINAL DETACHMENT SURGERY Left ?1995  . SHOULDER OPEN ROTATOR CUFF REPAIR Right     Social History   Socioeconomic History  . Marital status: Married    Spouse name: Not on file  . Number of children: 1  . Years of education: Not on file  . Highest education level: Not on file  Occupational History  . 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 Financial resource strain: Not on file  . Food insecurity:    Worry: Not on file    Inability: Not on file  . Transportation needs:    Medical: Not on file    Non-medical: Not on file  Tobacco Use  . Smoking status: Never Smoker  . Smokeless tobacco: Never Used  . Tobacco comment: 11/02/2013 "smoked cigars when I was 18 or so; didn't smoke many"  Substance and Sexual Activity  . Alcohol use: Yes  Alcohol/week: 1.2 oz    Types: 2 Glasses of wine per week  . Drug use: No  . Sexual activity: Never  Lifestyle  . Physical activity:    Days per week: Not on file    Minutes per session: Not on file  . Stress: Not on file  Relationships  . Social connections:    Talks on phone: Not on file    Gets together: Not on file    Attends religious service: Not on file    Active member of club or organization: Not on file    Attends meetings of clubs or organizations: Not on file    Relationship status: Not on file  . Intimate partner violence:    Fear of current or ex partner: Not on file    Emotionally abused: Not on file    Physically abused: Not on file    Forced sexual activity: Not on file  Other Topics Concern  . 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 Always use your most recent med list.          acetaminophen 500 MG tablet Commonly known as:  TYLENOL Take 1,000 mg by mouth every 6 (six) hours as needed for headache (pain).   Adjustable Lancing Device Misc Reported on 04/30/2016   albuterol 108 (90 Base) MCG/ACT inhaler Commonly known as:  PROAIR HFA Inhale 2 puffs into the lungs every 6 (six) hours as needed for wheezing or shortness of breath.   Alcohol Prep 70 % Pads Reported on 04/30/2016   aspirin 81 MG EC tablet Take 2 tablets (162 mg total) by mouth daily.   azelastine 0.1 % nasal spray Commonly known as:  ASTELIN Place 2 sprays into both nostrils at bedtime. Use in each nostril as directed   BASAGLAR KWIKPEN 100 UNIT/ML Sopn Inject 0.17 mLs (17 Units total) into the skin at bedtime.   blood glucose meter kit and supplies Use as instructed   clopidogrel 75 MG tablet Commonly known as:  PLAVIX TAKE 1 TABLET DAILY WITH   BREAKFAST   ezetimibe 10 MG tablet Commonly known as:  ZETIA TAKE 1 TABLET DAILY   finasteride 5 MG tablet Commonly known as:  PROSCAR Take 5 mg by mouth daily.   Fluticasone-Salmeterol 100-50 MCG/DOSE Aepb Commonly known as:  ADVAIR Inhale 1 puff into the lungs 2 (two) times daily.   furosemide 40 MG tablet Commonly known as:  LASIX TAKE 1 TABLET BY MOUTH EVERY DAY   glucose blood test strip Use to test blood sugars twice daily   Grape Seed 100 MG Caps Take 200 mg by mouth daily.   Insulin Pen Needle 32G X 4 MM Misc To use w/ Basaglar   isosorbide mononitrate  30 MG 24 hr tablet Commonly known as:  IMDUR Take 0.5 tablets (15 mg total) by mouth daily.   lisinopril 2.5 MG tablet Commonly known  as:  PRINIVIL,ZESTRIL Take 1 tablet (2.5 mg total) by mouth daily.   metFORMIN 850 MG tablet Commonly known as:  GLUCOPHAGE Take 1.5 tablets by mouth twice daily with meal   metoprolol tartrate 25 MG tablet Commonly known as:  LOPRESSOR TAKE 1/2 TABLET (=12.5MG   TOTAL) TWO TIMES A DAY   mometasone 50 MCG/ACT nasal spray Commonly known as:  NASONEX Place 2 sprays into the nose daily.   nitroGLYCERIN 0.4 MG SL tablet Commonly known as:  NITROSTAT Place 1 tablet (0.4 mg total) under the tongue every 5 (five) minutes x 3 doses as needed for chest pain.   pantoprazole 40 MG tablet Commonly known as:  PROTONIX TAKE 1 TABLET DAILY   rosuvastatin 10 MG tablet Commonly known as:  CRESTOR TAKE 1 TABLET DAILY   SYSTANE BALANCE OP Place 1 drop into both eyes daily as needed (dry eyes).          Objective:   Physical Exam BP 126/66 (BP Location: Left Arm, Patient Position: Sitting, Cuff Size: Small)   Pulse 68   Temp 97.6 F (36.4 C) (Oral)   Resp 16   Ht 5' 7.5" (1.715 m)   Wt 179 lb 2 oz (81.3 kg)   SpO2 96%   BMI 27.64 kg/m  General:   Well developed, well nourished . NAD.  HEENT:  Normocephalic . Face symmetric, atraumatic Neck: No JVD Lungs:  Decreased breath sounds otherwise clear Normal respiratory effort, no intercostal retractions, no accessory muscle use. Heart: RRR,  no murmur.  no pretibial edema bilaterally  Abdomen:  Not distended, soft, non-tender. No rebound or rigidity.   Skin: Not pale. Not jaundice Neurologic:  alert & oriented X3.  Speech normal, gait appropriate for age and unassisted Psych--  Cognition and judgment appear intact.  Cooperative with normal attention span and concentration.  Behavior appropriate. No anxious or depressed appearing.     Assessment & Plan:   Assessment Diabetes, no  neuropathy as of 10-2015 10-2015: d/c Lantus but had to go back on it 10-2015 , dc Actos (dt CHF) and started metformin Hyperlipidemia Hypertension CV: --CAD, CABG 1610 --CHF: Systolic, diastolic, EF echocardiogram 2014---45% --Peripheral vascular disease Pulmonary -OSA: On CPAP -asthma -  PFTs 06-2017: Obstructive and restrictive disease with + response to bronchodilators Hearing loss   PLAN DOE 84 year old gentleman with CAD, CHF, DM, hypertension presents with increased DOE from baseline. Recently -due to low BP- Lasix dose decreased. Exam is benign with no overt vol overload. EKG: T wave inversions noted on be 5 and V6, not present in the previous EKG but present on older EKGs. D/w cardiology, they recommend a echocardiogram and follow-up with primary cardiologist Plan: Chest x-ray, go back on full dose of Lasix with 40 mg daily (call if BP drops below 110-105), echocardiogram.  Will arrange a visit with cardiology ER if severe symptoms. Follow-up with me 1 month

## 2018-03-05 NOTE — Patient Instructions (Signed)
Next visit in 1 month here, please make an appointment  Get a x-ray done at the first floor  Go back to a full tablet of Lasix every day  ER if severe difficulty breathing, chest pain, swelling.  We will arrange for echocardiogram and visit with your heart doctor.

## 2018-03-06 ENCOUNTER — Ambulatory Visit: Payer: Medicare Other | Admitting: Internal Medicine

## 2018-03-07 ENCOUNTER — Ambulatory Visit (HOSPITAL_COMMUNITY): Payer: Medicare Other | Attending: Cardiology

## 2018-03-07 ENCOUNTER — Other Ambulatory Visit: Payer: Self-pay

## 2018-03-07 DIAGNOSIS — I110 Hypertensive heart disease with heart failure: Secondary | ICD-10-CM | POA: Insufficient documentation

## 2018-03-07 DIAGNOSIS — G4733 Obstructive sleep apnea (adult) (pediatric): Secondary | ICD-10-CM | POA: Diagnosis not present

## 2018-03-07 DIAGNOSIS — I255 Ischemic cardiomyopathy: Secondary | ICD-10-CM | POA: Insufficient documentation

## 2018-03-07 DIAGNOSIS — E1151 Type 2 diabetes mellitus with diabetic peripheral angiopathy without gangrene: Secondary | ICD-10-CM | POA: Insufficient documentation

## 2018-03-07 DIAGNOSIS — I2510 Atherosclerotic heart disease of native coronary artery without angina pectoris: Secondary | ICD-10-CM | POA: Insufficient documentation

## 2018-03-07 DIAGNOSIS — R55 Syncope and collapse: Secondary | ICD-10-CM | POA: Insufficient documentation

## 2018-03-07 DIAGNOSIS — E785 Hyperlipidemia, unspecified: Secondary | ICD-10-CM | POA: Diagnosis not present

## 2018-03-07 DIAGNOSIS — I083 Combined rheumatic disorders of mitral, aortic and tricuspid valves: Secondary | ICD-10-CM | POA: Insufficient documentation

## 2018-03-07 DIAGNOSIS — R0602 Shortness of breath: Secondary | ICD-10-CM

## 2018-03-07 DIAGNOSIS — I509 Heart failure, unspecified: Secondary | ICD-10-CM | POA: Insufficient documentation

## 2018-03-07 DIAGNOSIS — Z951 Presence of aortocoronary bypass graft: Secondary | ICD-10-CM | POA: Diagnosis not present

## 2018-03-07 MED ORDER — PERFLUTREN LIPID MICROSPHERE
1.0000 mL | INTRAVENOUS | Status: AC | PRN
  Administered 2018-03-07: 2 mL via INTRAVENOUS

## 2018-03-19 ENCOUNTER — Other Ambulatory Visit: Payer: Self-pay | Admitting: Cardiovascular Disease

## 2018-03-19 DIAGNOSIS — Z951 Presence of aortocoronary bypass graft: Secondary | ICD-10-CM

## 2018-03-19 DIAGNOSIS — E785 Hyperlipidemia, unspecified: Secondary | ICD-10-CM

## 2018-03-19 DIAGNOSIS — R079 Chest pain, unspecified: Secondary | ICD-10-CM

## 2018-03-19 NOTE — Telephone Encounter (Signed)
Rx sent to pharmacy   

## 2018-03-21 MED FILL — Perflutren Lipid Microsphere IV Susp 6.52 MG/ML: INTRAVENOUS | Qty: 2 | Status: AC

## 2018-03-25 ENCOUNTER — Ambulatory Visit (HOSPITAL_COMMUNITY)
Admission: RE | Admit: 2018-03-25 | Discharge: 2018-03-25 | Disposition: A | Discharge status: Home / Self Care | Payer: Medicare Other | Source: Ambulatory Visit | Attending: Cardiovascular Disease | Admitting: Cardiovascular Disease

## 2018-03-25 ENCOUNTER — Other Ambulatory Visit: Payer: Self-pay | Admitting: *Deleted

## 2018-03-25 ENCOUNTER — Encounter: Payer: Self-pay | Admitting: Cardiovascular Disease

## 2018-03-25 ENCOUNTER — Ambulatory Visit (INDEPENDENT_AMBULATORY_CARE_PROVIDER_SITE_OTHER): Payer: Medicare Other | Admitting: Cardiovascular Disease

## 2018-03-25 DIAGNOSIS — I739 Peripheral vascular disease, unspecified: Secondary | ICD-10-CM

## 2018-03-25 DIAGNOSIS — I2510 Atherosclerotic heart disease of native coronary artery without angina pectoris: Secondary | ICD-10-CM | POA: Insufficient documentation

## 2018-03-25 DIAGNOSIS — R51 Headache: Secondary | ICD-10-CM | POA: Diagnosis not present

## 2018-03-25 DIAGNOSIS — Z951 Presence of aortocoronary bypass graft: Secondary | ICD-10-CM

## 2018-03-25 DIAGNOSIS — I10 Essential (primary) hypertension: Secondary | ICD-10-CM | POA: Diagnosis not present

## 2018-03-25 DIAGNOSIS — R42 Dizziness and giddiness: Secondary | ICD-10-CM | POA: Diagnosis not present

## 2018-03-25 DIAGNOSIS — I779 Disorder of arteries and arterioles, unspecified: Secondary | ICD-10-CM | POA: Diagnosis present

## 2018-03-25 DIAGNOSIS — I6523 Occlusion and stenosis of bilateral carotid arteries: Secondary | ICD-10-CM | POA: Insufficient documentation

## 2018-03-25 DIAGNOSIS — I255 Ischemic cardiomyopathy: Secondary | ICD-10-CM | POA: Diagnosis not present

## 2018-03-25 DIAGNOSIS — E7800 Pure hypercholesterolemia, unspecified: Secondary | ICD-10-CM | POA: Diagnosis not present

## 2018-03-25 DIAGNOSIS — E119 Type 2 diabetes mellitus without complications: Secondary | ICD-10-CM | POA: Diagnosis not present

## 2018-03-25 DIAGNOSIS — E785 Hyperlipidemia, unspecified: Secondary | ICD-10-CM | POA: Diagnosis not present

## 2018-03-25 NOTE — Assessment & Plan Note (Signed)
3 of essential hypertension her blood pressure measured at 128/54.  He is on metoprolol lisinopril.  Continue current meds at current dosing

## 2018-03-25 NOTE — Progress Notes (Signed)
VAS 

## 2018-03-25 NOTE — Progress Notes (Signed)
03/25/2018 [name].   11-28-1925  [id number]  Primary Physician Colon Branch, MD Primary Cardiologist: Ryan Harp MD FACP, [place], [place], [place]  HPI:  [name]. is a 84 y.o.  married Caucasian male with a history of CAD status post coronary artery bypass grafting in 1997 with a LIMA to his LAD, a vein to a first diagonal branch, OM and PDA. I last saw him in the office  02/15/2017.Marland Kitchen His other problems include hypertension, hyperlipidemia and noninsulin-requiring diabetes. He has known peripheral vascular with moderate bilateral internal carotid artery stenosis which we are following by duplex ultrasound. He had a Myoview performed September 20, 2011, which showed scar in the LAD territory unchanged from prior studies with an EF of 35% to 45% by 2D echo. He presented to [facility] in early December with shortness of breath and congestive heart failure and was transferred to [facility] for further evaluation. He did have slightly elevated troponins and catheterization revealed stenosis in his SVG to his PDA which was stented with a DES stent. His EF at that time was 20% to 25% and a LifeVest was placed. He has done well since that time and is relatively asymptomatic. Limited echo today revealed an EF in the 45% range. Since I saw him back in 3 months ago he has seen Kerin Ransom has increased his Lasix. He continues to have dyspnea on exertion with class III symptoms and admits to dietary indiscretion with regards to salt. When I saw him a month ago he was relating episodes of nitroglycerin responsive chest pain. I did begin him on a acting nitrate and obtain a Myoview stress test on 05/16/16 that showed a large scar in the LAD territory not significantly changed from his Myoview performed 09/20/11. Since I saw him last visit chest pain has resolved. He recently was admitted to [facility] 01/13/17 for 2 days with syncope. He was with his family at an event and he noticed that his eyes  "rolled back into his head and he was slumped over. He felt depressed. EMS was called. His initial blood pressure was 88/50. He was given a fluid bolus. His EKG showed sinus rhythm. Echo revealed ejection fraction of 35-40% similar to prior echoes. He's had no recurrent symptoms. He does complain of significant dyspnea on exertion which is functionally limiting.  His most recent 2D echo performed 03/07/2018 revealed a slight decline in his ejection fraction to 25 to 30%.     Current Meds  Medication Sig  . acetaminophen (TYLENOL) 500 MG tablet Take 1,000 mg by mouth every 6 (six) hours as needed for headache (pain).  Marland Kitchen albuterol (PROAIR HFA) 108 (90 Base) MCG/ACT inhaler Inhale 2 puffs into the lungs every 6 (six) hours as needed for wheezing or shortness of breath.  . Alcohol Swabs (ALCOHOL PREP) 70 % PADS Reported on 04/30/2016  . aspirin EC 81 MG EC tablet Take 2 tablets (162 mg total) by mouth daily.  Marland Kitchen azelastine (ASTELIN) 0.1 % nasal spray Place 2 sprays into both nostrils at bedtime. Use in each nostril as directed  . Blood Glucose Monitoring Suppl (BLOOD GLUCOSE METER) kit Use as instructed  . clopidogrel (PLAVIX) 75 MG tablet Take 1 tablet (75 mg total) by mouth daily with breakfast. Keep OV  . ezetimibe (ZETIA) 10 MG tablet Take 1 tablet (10 mg total) by mouth daily. Keep OV  . finasteride (PROSCAR) 5 MG tablet Take 5 mg by mouth  daily.  . Fluticasone-Salmeterol (ADVAIR) 100-50 MCG/DOSE AEPB Inhale 1 puff into the lungs 2 (two) times daily.  . furosemide (LASIX) 40 MG tablet TAKE 1 TABLET BY MOUTH EVERY DAY  . glucose blood test strip Use to test blood sugars twice daily  . Grape Seed 100 MG CAPS Take 200 mg by mouth daily.   . Insulin Glargine (BASAGLAR KWIKPEN) 100 UNIT/ML SOPN Inject 0.17 mLs (17 Units total) into the skin at bedtime.  . Insulin Pen Needle 32G X 4 MM MISC To use w/ Basaglar  . isosorbide mononitrate (IMDUR) 30 MG 24 hr tablet Take 0.5 tablets (15 mg total) by mouth  daily.  Elmore Guise Devices (ADJUSTABLE LANCING DEVICE) MISC Reported on 04/30/2016  . lisinopril (PRINIVIL,ZESTRIL) 2.5 MG tablet Take 1 tablet (2.5 mg total) by mouth daily. Keep OV  . metFORMIN (GLUCOPHAGE) 850 MG tablet Take 1.5 tablets by mouth twice daily with meal  . metoprolol tartrate (LOPRESSOR) 25 MG tablet TAKE 1/2 TABLET (=12.5MG   TOTAL) TWO TIMES A DAY  . mometasone (NASONEX) 50 MCG/ACT nasal spray Place 2 sprays into the nose daily.  . nitroGLYCERIN (NITROSTAT) 0.4 MG SL tablet Place 1 tablet (0.4 mg total) under the tongue every 5 (five) minutes x 3 doses as needed for chest pain.  . pantoprazole (PROTONIX) 40 MG tablet TAKE 1 TABLET DAILY  . pantoprazole (PROTONIX) 40 MG tablet Take 1 tablet (40 mg total) by mouth daily. Keep OV  . Propylene Glycol (SYSTANE BALANCE OP) Place 1 drop into both eyes daily as needed (dry eyes).   . rosuvastatin (CRESTOR) 10 MG tablet TAKE 1 TABLET DAILY  . [DISCONTINUED] clopidogrel (PLAVIX) 75 MG tablet TAKE 1 TABLET DAILY WITH   BREAKFAST  . [DISCONTINUED] ezetimibe (ZETIA) 10 MG tablet TAKE 1 TABLET DAILY  . [DISCONTINUED] metoprolol tartrate (LOPRESSOR) 25 MG tablet Take 1/2 tablet (12.55m) two times a day. Keep OV     Allergies  Allergen Reactions  . Ativan [Lorazepam] Other (See Comments)    Made him crazy  . Colesevelam Other (See Comments)    Unknown reaction  . Ezetimibe-Simvastatin Other (See Comments)    Vytorin caused pancreatitis    Social History   Socioeconomic History  . Marital status: Married    Spouse name: Not on file  . Number of children: 1  . Years of education: Not on file  . Highest education level: Not on file  Occupational History  . 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 Financial resource strain: Not on file  . Food insecurity:    Worry: Not on file    Inability: Not on file  . Transportation needs:    Medical: Not on file    Non-medical: Not on file  Tobacco Use  .  Smoking status: Never Smoker  . Smokeless tobacco: Never Used  . Tobacco comment: 11/02/2013 "smoked cigars when I was 18 or so; didn't smoke many"  Substance and Sexual Activity  . Alcohol use: Yes    Alcohol/week: 1.2 oz    Types: 2 Glasses of wine per week  . Drug use: No  . Sexual activity: Never  Lifestyle  . Physical activity:    Days per week: Not on file    Minutes per session: Not on file  . Stress: Not on file  Relationships  . Social connections:    Talks on phone: Not on file    Gets together: Not on file  Attends religious service: Not on file    Active member of club or organization: Not on file    Attends meetings of clubs or organizations: Not on file    Relationship status: Not on file  . Intimate partner violence:    Fear of current or ex partner: Not on file    Emotionally abused: Not on file    Physically abused: Not on file    Forced sexual activity: Not on file  Other Topics Concern  . Not on file  Social History Narrative   Married '50-24 yrs, divorced; married '82   1 son- '51   Retired but keeps up 4 rental houses, mows   End of Life: no CPR, no heroic or futile measures     Review of Systems: General: negative for chills, fever, night sweats or weight changes.  Cardiovascular: negative for chest pain, dyspnea on exertion, edema, orthopnea, palpitations, paroxysmal nocturnal dyspnea or shortness of breath Dermatological: negative for rash Respiratory: negative for cough or wheezing Urologic: negative for hematuria Abdominal: negative for nausea, vomiting, diarrhea, bright red blood per rectum, melena, or hematemesis Neurologic: negative for visual changes, syncope, or dizziness All other systems reviewed and are otherwise negative except as noted above.    Blood pressure (!) 128/54, pulse 77, height '5\' 8"'  (1.727 m), weight 174 lb (78.9 kg).  General appearance: alert and no distress Neck: no adenopathy, no carotid bruit, no JVD, supple,  symmetrical, trachea midline and thyroid not enlarged, symmetric, no tenderness/mass/nodules Lungs: clear to auscultation bilaterally Heart: regular rate and rhythm, S1, S2 normal, no murmur, click, rub or gallop Extremities: extremities normal, atraumatic, no cyanosis or edema Pulses: 2+ and symmetric Skin: Skin color, texture, turgor normal. No rashes or lesions Neurologic: Alert and oriented X 3, normal strength and tone. Normal symmetric reflexes. Normal coordination and gait  EKG not performed today  ASSESSMENT AND PLAN:   Hyperlipidemia History of hyperlipidemia on Zetia and statin therapy with lipid profile performed 07/03/2017 revealing total cholesterol 104, LDL 41 and HDL 52.  Hx of CABG History of CAD status post coronary artery bypass grafting in 1997 with a LIMA to his LAD, vein to the first diagonal branch, and OM and PDA.  He had a cath performed in 2012 stenosis in his SVG to the PDA which was stented using a DES.  His EF at that time was 20 to 25%.  He denies chest pain but does have increasing shortness of breath with known ischemic cardia myopathy.  ICM, EF 20-25% by cath 09/16/12- improved to 45 % by Echo 3/14 History of ischemic heart myopathy with a recent echo performed 03/07/2018 revealing an ejection fraction of 25 to 30%, mildly lower than prior 2D echo with increasing shortness of breath on appropriate medications.  I did refer him to the advanced heart failure clinic last year and he did see Dr. Haroldine Laws felt that his pharmacology was optimized and did not think he was an [place] candidate given his blood pressure.  PVD- moderate carotid disease History of carotid artery disease with Doppler performed 03/25/2018 revealing moderate right and mild left ICA stenosis.  Essential hypertension 3 of essential hypertension her blood pressure measured at 128/54.  He is on metoprolol lisinopril.  Continue current meds at current dosing      Ryan Harp MD  [facility], [facility] 03/25/2018 11:10 AM

## 2018-03-25 NOTE — Assessment & Plan Note (Signed)
History of carotid artery disease with Doppler performed 03/25/2018 revealing moderate right and mild left ICA stenosis.

## 2018-03-25 NOTE — Patient Instructions (Signed)
Your physician wants you to follow-up in: 6 MONTHS WITH LUKE KILROY PA You will receive a reminder letter in the mail two months in advance. If you don't receive a letter, please call our office to schedule the follow-up appointment.   Your physician wants you to follow-up in: ONE YEAR WITH DR BERRY You will receive a reminder letter in the mail two months in advance. If you don't receive a letter, please call our office to schedule the follow-up appointment.   If you need a refill on your cardiac medications before your next appointment, please call your pharmacy.  

## 2018-03-25 NOTE — Assessment & Plan Note (Signed)
History of CAD status post coronary artery bypass grafting in 1997 with a LIMA to his LAD, vein to the first diagonal branch, and OM and PDA.  He had a cath performed in 2012 stenosis in his SVG to the PDA which was stented using a DES.  His EF at that time was 20 to 25%.  He denies chest pain but does have increasing shortness of breath with known ischemic cardia myopathy.

## 2018-03-25 NOTE — Assessment & Plan Note (Signed)
History of ischemic heart myopathy with a recent echo performed 03/07/2018 revealing an ejection fraction of 25 to 30%, mildly lower than prior 2D echo with increasing shortness of breath on appropriate medications.  I did refer him to the advanced heart failure clinic last year and he did see Dr. Gala Romney felt that his pharmacology was optimized and did not think he was an [place] candidate given his blood pressure.

## 2018-03-25 NOTE — Assessment & Plan Note (Signed)
History of hyperlipidemia on Zetia and statin therapy with lipid profile performed 07/03/2017 revealing total cholesterol 104, LDL 41 and HDL 52.

## 2018-04-06 ENCOUNTER — Other Ambulatory Visit: Payer: Self-pay | Admitting: Cardiovascular Disease

## 2018-04-07 NOTE — Telephone Encounter (Signed)
Rx request sent to pharmacy.  

## 2018-04-08 ENCOUNTER — Ambulatory Visit (INDEPENDENT_AMBULATORY_CARE_PROVIDER_SITE_OTHER): Payer: Medicare Other | Admitting: Internal Medicine

## 2018-04-08 ENCOUNTER — Encounter: Payer: Self-pay | Admitting: Internal Medicine

## 2018-04-08 VITALS — BP 118/65 | HR 69 | Temp 97.8°F | Resp 16 | Ht 68.0 in | Wt 173.1 lb

## 2018-04-08 DIAGNOSIS — I5042 Chronic combined systolic (congestive) and diastolic (congestive) heart failure: Secondary | ICD-10-CM

## 2018-04-08 DIAGNOSIS — I255 Ischemic cardiomyopathy: Secondary | ICD-10-CM

## 2018-04-08 NOTE — Patient Instructions (Addendum)
  GO TO THE FRONT DESK Schedule your next appointment for a  Check up in 3 months (ok to reschedule August)

## 2018-04-08 NOTE — Progress Notes (Signed)
Pre visit review using our clinic review tool, if applicable. No additional management support is needed unless otherwise documented below in the visit note. 

## 2018-04-08 NOTE — Progress Notes (Signed)
Subjective:    Patient ID: Ryan Plumb., male    DOB: 1926/05/28, 84 y.o.   MRN: [id number]  DOS:  04/08/2018 Type of visit - description : f/u Interval history: Was recently seen with DOE, x-rays, labs and office visit notes since then reviewed.  Wt Readings from Last 3 Encounters:  04/08/18 173 lb 2 oz (78.5 kg)  03/25/18 174 lb (78.9 kg)  03/05/18 179 lb 2 oz (81.3 kg)     Review of Systems Patient feels stable, DOE about the same. No chest pain or palpitations.  No lower extremity edema Able to do his ADLs with mild DOE  Past Medical History:  Diagnosis Date  . Acute CHF ([place]) 09/15/2012  . Asthma    pt denies this hx on 11/02/2013  . BPH (benign prostatic hyperplasia)   . CAD (coronary artery disease)    CABG '97, SVG DES 12/13  . Colonic polyp   . Exertional shortness of breath   . GERD (gastroesophageal reflux disease)   . Hearing loss   . History of pancreatitis    pt denies this hx on 11/02/2013  . Hyperlipidemia   . Hypertension   . Intrinsic asthma 06/18/2007   Qualifier: Diagnosis of  By: Dance CMA ([place]), Kim    . Ischemic cardiomyopathy 09/16/2012   EF 25%- improved to 45% 3/14  . Myocardial infarction ([place]) 1997  . Obstructed, uropathy   . PVD (peripheral vascular disease) ([facility])    moderate bilat carotid disease  . Syncope and collapse 11/02/2013   "didn't pass out" (11/02/2013)  . Type II diabetes mellitus ([place])     Past Surgical History:  Procedure Laterality Date  . CATARACT EXTRACTION W/ INTRAOCULAR LENS  IMPLANT, BILATERAL Bilateral ~ 1970  . CHOLECYSTECTOMY  2000's  . CORONARY ANGIOPLASTY WITH STENT PLACEMENT  Dec 2013   DES to SVG-CFX/PDA  . CORONARY ARTERY BYPASS GRAFT  1997   "CABG X 5"  . INGUINAL HERNIA REPAIR Bilateral ~ 1950  . LEFT HEART CATHETERIZATION WITH CORONARY/GRAFT ANGIOGRAM N/A 09/16/2012   Procedure: LEFT HEART CATHETERIZATION WITH Beatrix Fetters;  Surgeon: Lorretta Harp, MD;  Location: [facility] CATH LAB;  Service:  Cardiovascular;  Laterality: N/A;  . NASAL SEPTUM SURGERY  2007  . RETINAL DETACHMENT SURGERY Left ?1995  . SHOULDER OPEN ROTATOR CUFF REPAIR Right     Social History   Socioeconomic History  . Marital status: Married    Spouse name: Not on file  . Number of children: 1  . Years of education: Not on file  . Highest education level: Not on file  Occupational History  . 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 Financial resource strain: Not on file  . Food insecurity:    Worry: Not on file    Inability: Not on file  . Transportation needs:    Medical: Not on file    Non-medical: Not on file  Tobacco Use  . Smoking status: Never Smoker  . Smokeless tobacco: Never Used  . Tobacco comment: 11/02/2013 "smoked cigars when I was 18 or so; didn't smoke many"  Substance and Sexual Activity  . Alcohol use: Yes    Alcohol/week: 1.2 oz    Types: 2 Glasses of wine per week  . Drug use: No  . Sexual activity: Never  Lifestyle  . Physical activity:    Days per week: Not on file    Minutes per session: Not on file  .  Stress: Not on file  Relationships  . Social connections:    Talks on phone: Not on file    Gets together: Not on file    Attends religious service: Not on file    Active member of club or organization: Not on file    Attends meetings of clubs or organizations: Not on file    Relationship status: Not on file  . Intimate partner violence:    Fear of current or ex partner: Not on file    Emotionally abused: Not on file    Physically abused: Not on file    Forced sexual activity: Not on file  Other Topics Concern  . 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 Always use your most recent med list.          acetaminophen 500 MG tablet Commonly known as:  TYLENOL Take 1,000 mg by mouth every 6 (six) hours as needed for headache (pain).   Adjustable Lancing Device Misc Reported on 04/30/2016   albuterol 108 (90 Base) MCG/ACT inhaler Commonly known as:  PROAIR HFA Inhale 2 puffs into the lungs every 6 (six) hours as needed for wheezing or shortness of breath.   Alcohol Prep 70 % Pads Reported on 04/30/2016   aspirin 81 MG EC tablet Take 2 tablets (162 mg total) by mouth daily.   azelastine 0.1 % nasal spray Commonly known as:  ASTELIN Place 2 sprays into both nostrils at bedtime. Use in each nostril as directed   BASAGLAR KWIKPEN 100 UNIT/ML Sopn Inject 0.17 mLs (17 Units total) into the skin at bedtime.   blood glucose meter kit and supplies Use as instructed   clopidogrel 75 MG tablet Commonly known as:  PLAVIX Take 1 tablet (75 mg total) by mouth daily with breakfast. Keep OV   ezetimibe 10 MG tablet Commonly known as:  ZETIA Take 1 tablet (10 mg total) by mouth daily. Keep OV   finasteride 5 MG tablet Commonly known as:  PROSCAR Take 5 mg by mouth daily.   Fluticasone-Salmeterol 100-50 MCG/DOSE Aepb Commonly known as:  ADVAIR Inhale 1 puff into the lungs 2 (two) times daily.   furosemide 40 MG tablet Commonly known as:  LASIX TAKE 1 TABLET BY MOUTH EVERY DAY   glucose blood test strip Use to test blood sugars twice daily   Grape Seed 100 MG Caps Take 200 mg by mouth daily.   Insulin Pen Needle 32G X 4 MM Misc To use w/ Basaglar   isosorbide mononitrate 30 MG 24 hr tablet Commonly known as:  IMDUR Take 0.5 tablets (15 mg total) by mouth daily.   lisinopril 2.5 MG tablet Commonly known as:  PRINIVIL,ZESTRIL Take 1 tablet (2.5 mg total) by  mouth daily. Keep OV   metFORMIN 850 MG tablet Commonly known as:  GLUCOPHAGE Take 1.5 tablets by mouth twice daily with meal   metoprolol tartrate  25 MG tablet Commonly known as:  LOPRESSOR TAKE 1/2 TABLET (=12.5MG   TOTAL) TWO TIMES A DAY   mometasone 50 MCG/ACT nasal spray Commonly known as:  NASONEX Place 2 sprays into the nose daily.   nitroGLYCERIN 0.4 MG SL tablet Commonly known as:  NITROSTAT Place 1 tablet (0.4 mg total) under the tongue every 5 (five) minutes x 3 doses as needed for chest pain.   pantoprazole 40 MG tablet Commonly known as:  PROTONIX Take 1 tablet (40 mg total) by mouth daily. Keep OV   rosuvastatin 10 MG tablet Commonly known as:  CRESTOR TAKE 1 TABLET DAILY   SYSTANE BALANCE OP Place 1 drop into both eyes daily as needed (dry eyes).          Objective:   Physical Exam BP 118/65 (BP Location: Right Arm, Patient Position: Sitting, Cuff Size: Small)   Pulse 69   Temp 97.8 F (36.6 C) (Oral)   Resp 16   Ht '5\' 8"'  (1.727 m)   Wt 173 lb 2 oz (78.5 kg)   SpO2 97%   BMI 26.32 kg/m  General:   Well developed, well nourished . NAD.  HEENT:  Normocephalic . Face symmetric, atraumatic Lungs:  Decreased breath sounds otherwise clear Normal respiratory effort, no intercostal retractions, no accessory muscle use. Heart: RRR,  no murmur.  no pretibial edema bilaterally  Abdomen:  Not distended, soft, non-tender. No rebound or rigidity.   Skin: Not pale. Not jaundice Neurologic:  alert & oriented X3.  Speech normal, gait appropriate for age and unassisted Psych--  Cognition and judgment appear intact.  Cooperative with normal attention span and concentration.  Behavior appropriate. No anxious or depressed appearing.    Assessment & Plan:   Assessment Diabetes, no neuropathy as of 10-2015 10-2015: d/c Lantus but had to go back on it 10-2015 , dc Actos (dt CHF) and started metformin Hyperlipidemia Hypertension CV: --CAD, CABG  5809 --CHF: Systolic, diastolic, EF echocardiogram 2014---45%.  Echo 02/2018: EF 25%  --Peripheral vascular disease Pulmonary -OSA: On CPAP -asthma -  PFTs 06-2017: Obstructive and restrictive disease with + response to bronchodilators Hearing loss   PLAN DOE: See next CHF/ischemic cardiomyopathy: Since the last visit, labs were satisfactory, patient is taking more Lasix, EF per echo decreased to 25%.  Saw cardiology, they noted that was seen at the advanced CHF clinic 05-2017 and he was not a candidate for Entresto, consequently he was felt to be stable and recommended no changes. Patient is 84 y/o, able to do all his ADLs, continue present care. RTC 3 months

## 2018-04-09 NOTE — Assessment & Plan Note (Signed)
DOE: See next CHF/ischemic cardiomyopathy: Since the last visit, labs were satisfactory, patient is taking more Lasix, EF per echo decreased to 25%.  Saw cardiology, they noted that was seen at the advanced CHF clinic 05-2017 and he was not a candidate for Entresto, consequently he was felt to be stable and recommended no changes. Patient is 84 y/o, able to do all his ADLs, continue present care. RTC 3 months

## 2018-06-03 ENCOUNTER — Ambulatory Visit: Payer: Medicare Other | Admitting: Internal Medicine

## 2018-06-28 ENCOUNTER — Other Ambulatory Visit: Payer: Self-pay | Admitting: Cardiovascular Disease

## 2018-06-28 DIAGNOSIS — E785 Hyperlipidemia, unspecified: Secondary | ICD-10-CM

## 2018-06-28 DIAGNOSIS — Z951 Presence of aortocoronary bypass graft: Secondary | ICD-10-CM

## 2018-06-28 DIAGNOSIS — R079 Chest pain, unspecified: Secondary | ICD-10-CM

## 2018-07-07 ENCOUNTER — Other Ambulatory Visit: Payer: Self-pay

## 2018-07-08 ENCOUNTER — Encounter: Payer: Self-pay | Admitting: Internal Medicine

## 2018-07-08 ENCOUNTER — Ambulatory Visit (INDEPENDENT_AMBULATORY_CARE_PROVIDER_SITE_OTHER): Payer: Medicare Other | Admitting: Internal Medicine

## 2018-07-08 VITALS — BP 118/64 | HR 76 | Temp 97.9°F | Resp 16 | Ht 68.0 in | Wt 165.4 lb

## 2018-07-08 DIAGNOSIS — E871 Hypo-osmolality and hyponatremia: Secondary | ICD-10-CM

## 2018-07-08 DIAGNOSIS — I5042 Chronic combined systolic (congestive) and diastolic (congestive) heart failure: Secondary | ICD-10-CM

## 2018-07-08 DIAGNOSIS — I10 Essential (primary) hypertension: Secondary | ICD-10-CM

## 2018-07-08 DIAGNOSIS — Z23 Encounter for immunization: Secondary | ICD-10-CM | POA: Diagnosis not present

## 2018-07-08 DIAGNOSIS — E119 Type 2 diabetes mellitus without complications: Secondary | ICD-10-CM | POA: Diagnosis not present

## 2018-07-08 DIAGNOSIS — I255 Ischemic cardiomyopathy: Secondary | ICD-10-CM

## 2018-07-08 DIAGNOSIS — E785 Hyperlipidemia, unspecified: Secondary | ICD-10-CM

## 2018-07-08 DIAGNOSIS — Z794 Long term (current) use of insulin: Secondary | ICD-10-CM | POA: Diagnosis not present

## 2018-07-08 LAB — BASIC METABOLIC PANEL
BUN: 14 mg/dL (ref 6–23)
CO2: 28 meq/L (ref 19–32)
Calcium: 9.8 mg/dL (ref 8.4–10.5)
Chloride: 92 mEq/L — ABNORMAL LOW (ref 96–112)
Creatinine, Ser: 0.78 mg/dL (ref 0.40–1.50)
GFR: 99.32 mL/min (ref >60.00)
GLUCOSE: 117 mg/dL — AB (ref 70–99)
POTASSIUM: 4.9 meq/L (ref 3.5–5.1)
SODIUM: 129 meq/L — AB (ref 135–145)

## 2018-07-08 LAB — CBC WITH DIFFERENTIAL/PLATELET
BASOS PCT: 1.9 % (ref 0.0–3.0)
Basophils Absolute: 0.2 10*3/uL — ABNORMAL HIGH (ref 0.0–0.1)
Eosinophils Absolute: 0.7 10*3/uL (ref 0.0–0.7)
Eosinophils Relative: 7.3 % — ABNORMAL HIGH (ref 0.0–5.0)
HEMATOCRIT: 37.9 % — AB (ref 39.0–52.0)
HEMOGLOBIN: 12.9 g/dL — AB (ref 13.0–17.0)
LYMPHS PCT: 13.1 % (ref 12.0–46.0)
Lymphs Abs: 1.2 10*3/uL (ref 0.7–4.0)
MCHC: 34.1 g/dL (ref 30.0–36.0)
MCV: 87.6 fl (ref 78.0–100.0)
Monocytes Absolute: 1.2 10*3/uL — ABNORMAL HIGH (ref 0.1–1.0)
Monocytes Relative: 13 % — ABNORMAL HIGH (ref 3.0–12.0)
Neutro Abs: 6 10*3/uL (ref 1.4–7.7)
Neutrophils Relative %: 64.7 % (ref 43.0–77.0)
Platelets: 241 10*3/uL (ref 150.0–400.0)
RBC: 4.32 Mil/uL (ref 4.22–5.81)
RDW: 14.4 % (ref 11.5–15.5)
WBC: 9.2 10*3/uL (ref 4.0–10.5)

## 2018-07-08 LAB — HEMOGLOBIN A1C: Hgb A1c MFr Bld: 7.3 % — ABNORMAL HIGH (ref 4.6–6.5)

## 2018-07-08 LAB — LIPID PANEL
CHOL/HDL RATIO: 2
CHOLESTEROL: 110 mg/dL (ref 0–200)
HDL: 47 mg/dL (ref >39.00)
LDL CALC: 49 mg/dL (ref 0–99)
NonHDL: 62.8
Triglycerides: 67 mg/dL (ref 0.0–149.0)
VLDL: 13.4 mg/dL (ref 0.0–40.0)

## 2018-07-08 LAB — ALT: ALT: 15 U/L (ref 0–53)

## 2018-07-08 LAB — AST: AST: 22 U/L (ref 0–37)

## 2018-07-08 NOTE — Patient Instructions (Signed)
GO TO THE LAB : Get the blood work     GO TO THE FRONT DESK Schedule your next appointment for a checkup in 3 or 4 months  

## 2018-07-08 NOTE — Progress Notes (Signed)
Subjective:    Patient ID: Ryan Plumb., male    DOB: 1926-08-21, 84 y.o.   MRN: [id number]  DOS:  07/08/2018 Type of visit - description : rov, here w/ his wife Interval history: DM: Good med compliance, ambulatory CBGs rarely more than 200 Asthma: Uses inhalers as needed.  Really feels he is doing very well since the weather change it and is not as hot or humid. HTN: Good compliance with medication, ambulatory BPs are checked rarely and according to the patient they are normal.  Wt Readings from Last 3 Encounters:  07/08/18 165 lb 6 oz (75 kg)  04/08/18 173 lb 2 oz (78.5 kg)  03/25/18 174 lb (78.9 kg)     Review of Systems Denies chest pain no difficulty breathing No nausea, vomiting, diarrhea. In the mornings, he feels very tired when he wakes up but as the day goes by  he feels better.  Past Medical History:  Diagnosis Date  . Acute CHF ([place]) 09/15/2012  . Asthma    pt denies this hx on 11/02/2013  . BPH (benign prostatic hyperplasia)   . CAD (coronary artery disease)    CABG '97, SVG DES 12/13  . Colonic polyp   . Exertional shortness of breath   . GERD (gastroesophageal reflux disease)   . Hearing loss   . History of pancreatitis    pt denies this hx on 11/02/2013  . Hyperlipidemia   . Hypertension   . Intrinsic asthma 06/18/2007   Qualifier: Diagnosis of  By: Dance CMA ([place]), Kim    . Ischemic cardiomyopathy 09/16/2012   EF 25%- improved to 45% 3/14  . Myocardial infarction ([place]) 1997  . Obstructed, uropathy   . PVD (peripheral vascular disease) (HCC)    moderate bilat carotid disease  . Syncope and collapse 11/02/2013   "didn't pass out" (11/02/2013)  . Type II diabetes mellitus ([place])     Past Surgical History:  Procedure Laterality Date  . CATARACT EXTRACTION W/ INTRAOCULAR LENS  IMPLANT, BILATERAL Bilateral ~ 1970  . CHOLECYSTECTOMY  2000's  . CORONARY ANGIOPLASTY WITH STENT PLACEMENT  Dec 2013   DES to SVG-CFX/PDA  . CORONARY ARTERY BYPASS GRAFT   1997   "CABG X 5"  . INGUINAL HERNIA REPAIR Bilateral ~ 1950  . LEFT HEART CATHETERIZATION WITH CORONARY/GRAFT ANGIOGRAM N/A 09/16/2012   Procedure: LEFT HEART CATHETERIZATION WITH Beatrix Fetters;  Surgeon: Lorretta Harp, MD;  Location: [facility] CATH LAB;  Service: Cardiovascular;  Laterality: N/A;  . NASAL SEPTUM SURGERY  2007  . RETINAL DETACHMENT SURGERY Left ?1995  . SHOULDER OPEN ROTATOR CUFF REPAIR Right     Social History   Socioeconomic History  . Marital status: Married    Spouse name: Not on file  . Number of children: 1  . Years of education: Not on file  . Highest education level: Not on file  Occupational History  . 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 Financial resource strain: Not on file  . Food insecurity:    Worry: Not on file    Inability: Not on file  . Transportation needs:    Medical: Not on file    Non-medical: Not on file  Tobacco Use  . Smoking status: Never Smoker  . Smokeless tobacco: Never Used  . Tobacco comment: 11/02/2013 "smoked cigars when I was 18 or so; didn't smoke many"  Substance and Sexual Activity  . Alcohol use: Not Currently  Alcohol/week: 2.0 standard drinks    Types: 2 Glasses of wine per week  . Drug use: No  . Sexual activity: Not Currently  Lifestyle  . Physical activity:    Days per week: Not on file    Minutes per session: Not on file  . Stress: Not on file  Relationships  . Social connections:    Talks on phone: Not on file    Gets together: Not on file    Attends religious service: Not on file    Active member of club or organization: Not on file    Attends meetings of clubs or organizations: Not on file    Relationship status: Not on file  . Intimate partner violence:    Fear of current or ex partner: Not on file    Emotionally abused: Not on file    Physically abused: Not on file    Forced sexual activity: Not on file  Other Topics Concern  . Not on file  Social History  Narrative   Married '50-24 yrs, divorced; married '82   1 son- '51   Retired but keeps up [employer], mows   End of Life: no CPR, no heroic or futile measures      Allergies as of 07/08/2018      Reactions   Ativan [lorazepam] Other (See Comments)   Made him crazy   Colesevelam Other (See Comments)   Unknown reaction   Ezetimibe-simvastatin Other (See Comments)   Vytorin caused pancreatitis      Medication List        Accurate as of 07/08/18 11:59 PM. Always use your most recent med list.          acetaminophen 500 MG tablet Commonly known as:  TYLENOL Take 1,000 mg by mouth every 6 (six) hours as needed for headache (pain).   Adjustable Lancing Device Misc Reported on 04/30/2016   albuterol 108 (90 Base) MCG/ACT inhaler Commonly known as:  PROVENTIL HFA;VENTOLIN HFA Inhale 2 puffs into the lungs every 6 (six) hours as needed for wheezing or shortness of breath.   Alcohol Prep 70 % Pads Reported on 04/30/2016   aspirin 81 MG EC tablet Take 2 tablets (162 mg total) by mouth daily.   azelastine 0.1 % nasal spray Commonly known as:  ASTELIN Place 2 sprays into both nostrils at bedtime. Use in each nostril as directed   BASAGLAR KWIKPEN 100 UNIT/ML Sopn Inject 0.17 mLs (17 Units total) into the skin at bedtime.   blood glucose meter kit and supplies Use as instructed   clopidogrel 75 MG tablet Commonly known as:  PLAVIX TAKE 1 TABLET DAILY WITH   BREAKFAST. KEEP OFFICE     VISIT.   ezetimibe 10 MG tablet Commonly known as:  ZETIA TAKE 1 TABLET DAILY. KEEP  OFFICE VISIT   finasteride 5 MG tablet Commonly known as:  PROSCAR Take 5 mg by mouth daily.   Fluticasone-Salmeterol 100-50 MCG/DOSE Aepb Commonly known as:  ADVAIR Inhale 1 puff into the lungs 2 (two) times daily.   furosemide 40 MG tablet Commonly known as:  LASIX TAKE 1 TABLET BY MOUTH EVERY DAY   glucose blood test strip Use to test blood sugars twice daily   Grape Seed 100 MG Caps Take  200 mg by mouth daily.   Insulin Pen Needle 32G X 4 MM Misc To use w/ Basaglar   isosorbide mononitrate 30 MG 24 hr tablet Commonly known as:  IMDUR Take 0.5 tablets (15  mg total) by mouth daily.   lisinopril 2.5 MG tablet Commonly known as:  PRINIVIL,ZESTRIL TAKE 1 TABLET DAILY. KEEP  OFFICE VISIT.   metFORMIN 850 MG tablet Commonly known as:  GLUCOPHAGE Take 1.5 tablets by mouth twice daily with meal   metoprolol tartrate 25 MG tablet Commonly known as:  LOPRESSOR Take 0.5 tablets (12.5 mg total) by mouth 2 (two) times daily.   mometasone 50 MCG/ACT nasal spray Commonly known as:  NASONEX Place 2 sprays into the nose daily.   nitroGLYCERIN 0.4 MG SL tablet Commonly known as:  NITROSTAT Place 1 tablet (0.4 mg total) under the tongue every 5 (five) minutes x 3 doses as needed for chest pain.   pantoprazole 40 MG tablet Commonly known as:  PROTONIX TAKE 1 TABLET DAILY. KEEP  OFFICE VISIT.   rosuvastatin 10 MG tablet Commonly known as:  CRESTOR TAKE 1 TABLET DAILY.   SYSTANE BALANCE OP Place 1 drop into both eyes daily as needed (dry eyes).          Objective:   Physical Exam BP 118/64 (BP Location: Left Arm, Patient Position: Sitting, Cuff Size: Small)   Pulse 76   Temp 97.9 F (36.6 C) (Oral)   Resp 16   Ht '5\' 8"'  (1.727 m)   Wt 165 lb 6 oz (75 kg)   SpO2 98%   BMI 25.15 kg/m   General:   Well developed, NAD, see BMI.  HEENT:  Normocephalic . Face symmetric, atraumatic Lungs:  Decreased breath sounds otherwise clear Normal respiratory effort, no intercostal retractions, no accessory muscle use. Heart: RRR,  no murmur.  No pretibial edema bilaterally  Skin: Not pale. Not jaundice Neurologic:  alert & oriented X3.  Speech normal, gait appropriate for age and unassisted Psych--  Cognition and judgment appear intact.  Cooperative with normal attention span and concentration.  Behavior appropriate. No anxious or depressed appearing.    Assessment  & Plan:   Assessment Diabetes, no neuropathy as of 10-2015 10-2015: d/c Lantus but had to go back on it 10-2015 , dc Actos (dt CHF) and started metformin Hyperlipidemia Hypertension CV: --CAD, CABG 4982 --CHF: Systolic, diastolic, EF echocardiogram 2014---45%.  Echo 02/2018: EF 25%  --Peripheral vascular disease Pulmonary -OSA: On CPAP -asthma -  PFTs 06-2017: Obstructive and restrictive disease with + response to bronchodilators Hearing loss   PLAN DM: Currently on Lantus 70 units and metformin.  CBGs range from 122 to 200.  Very rarely over 200.  Check a A1c. Hyperlipidemia: On Crestor and Zetia, check a FLP, AST, ALT Hypertension: Seems well controlled on lisinopril, Lasix, Imdur, metoprolol check a BMP.  CBC. CHF: Seems to be doing very well, weight is down.  Checking electrolytes.  No change. Asthma: Using inhalers as needed.  Okay to use Advair twice a day if needed. High-dose flu shot today RTC 3 to 4 months

## 2018-07-08 NOTE — Progress Notes (Signed)
Pre visit review using our clinic review tool, if applicable. No additional management support is needed unless otherwise documented below in the visit note. 

## 2018-07-09 ENCOUNTER — Encounter: Payer: Self-pay | Admitting: Internal Medicine

## 2018-07-09 NOTE — Assessment & Plan Note (Signed)
DM: Currently on Lantus 70 units and metformin.  CBGs range from 122 to 200.  Very rarely over 200.  Check a A1c. Hyperlipidemia: On Crestor and Zetia, check a FLP, AST, ALT Hypertension: Seems well controlled on lisinopril, Lasix, Imdur, metoprolol check a BMP.  CBC. CHF: Seems to be doing very well, weight is down.  Checking electrolytes.  No change. Asthma: Using inhalers as needed.  Okay to use Advair twice a day if needed. High-dose flu shot today RTC 3 to 4 months

## 2018-07-11 NOTE — Addendum Note (Signed)
Addended byConrad Weston D on: 07/11/2018 02:11 PM   Modules accepted: Orders

## 2018-07-15 ENCOUNTER — Other Ambulatory Visit (INDEPENDENT_AMBULATORY_CARE_PROVIDER_SITE_OTHER): Payer: Medicare Other

## 2018-07-15 DIAGNOSIS — E871 Hypo-osmolality and hyponatremia: Secondary | ICD-10-CM

## 2018-07-15 LAB — BASIC METABOLIC PANEL
BUN: 14 mg/dL (ref 6–23)
CHLORIDE: 91 meq/L — AB (ref 96–112)
CO2: 29 meq/L (ref 19–32)
CREATININE: 0.81 mg/dL (ref 0.40–1.50)
Calcium: 9.1 mg/dL (ref 8.4–10.5)
GFR: 95.08 mL/min (ref >60.00)
Glucose, Bld: 133 mg/dL — ABNORMAL HIGH (ref 70–99)
Potassium: 4.3 mEq/L (ref 3.5–5.1)
Sodium: 129 mEq/L — ABNORMAL LOW (ref 135–145)

## 2018-07-17 LAB — SODIUM, URINE, RANDOM: SODIUM UR: 81 mmol/L (ref 28–272)

## 2018-07-17 LAB — OSMOLALITY: OSMOLALITY: 265 mosm/kg — AB (ref 278–305)

## 2018-07-17 NOTE — Addendum Note (Signed)
Addended byConrad [place] D on: 07/17/2018 02:51 PM   Modules accepted: Orders

## 2018-07-21 ENCOUNTER — Encounter: Payer: Self-pay | Admitting: Internal Medicine

## 2018-07-30 ENCOUNTER — Telehealth: Payer: Self-pay | Admitting: Internal Medicine

## 2018-07-30 ENCOUNTER — Encounter: Payer: Self-pay | Admitting: Internal Medicine

## 2018-07-30 NOTE — Telephone Encounter (Signed)
Author phoned wife to relay Dr. Leta Jungling message. Wife appreciative.

## 2018-07-30 NOTE — Telephone Encounter (Signed)
See pt's message , reports SOB; please triage the patient. If he has severe symptoms, fever, chills, chest pain: Needs to go to the ER. Otherwise I could see him in the office.

## 2018-07-30 NOTE — Telephone Encounter (Signed)
Wife states pt. has been feeling SOB for the past week, denies any other symptoms: no fever/chills/N/V/D. Wife states pt. has been taking all meds as prescribed, including the lasix. Wife admitted later though that she gave pt. 80mg  of lasix today, and that alone has helped his breathing. Author asked pt. to weight himself. Weight is 165lb., same as three weeks ago per our records. Pt. Is planning on coming in 11/5 for BMP check. Routed to Dr. Drue Novel to advise on action in the meantime.

## 2018-07-30 NOTE — Telephone Encounter (Signed)
If he got better with Lasix, he is probably slightly overloaded. Advised patient:  Take an extra 40 mg Lasix (80 mg total a day) for the next 2 days  If he is not improving needs office visit or ER if severe symptoms

## 2018-08-01 ENCOUNTER — Encounter: Payer: Self-pay | Admitting: Internal Medicine

## 2018-08-19 ENCOUNTER — Other Ambulatory Visit (INDEPENDENT_AMBULATORY_CARE_PROVIDER_SITE_OTHER): Payer: Medicare Other

## 2018-08-19 DIAGNOSIS — E871 Hypo-osmolality and hyponatremia: Secondary | ICD-10-CM

## 2018-08-19 LAB — BASIC METABOLIC PANEL
BUN: 14 mg/dL (ref 6–23)
CALCIUM: 8.9 mg/dL (ref 8.4–10.5)
CHLORIDE: 94 meq/L — AB (ref 96–112)
CO2: 31 meq/L (ref 19–32)
CREATININE: 0.86 mg/dL (ref 0.40–1.50)
GFR: 88.71 mL/min (ref >60.00)
GLUCOSE: 96 mg/dL (ref 70–99)
Potassium: 4 mEq/L (ref 3.5–5.1)
Sodium: 133 mEq/L — ABNORMAL LOW (ref 135–145)

## 2018-08-29 ENCOUNTER — Other Ambulatory Visit: Payer: Self-pay | Admitting: Internal Medicine

## 2018-08-29 DIAGNOSIS — J209 Acute bronchitis, unspecified: Secondary | ICD-10-CM

## 2018-09-05 ENCOUNTER — Encounter: Payer: Self-pay | Admitting: Internal Medicine

## 2018-09-06 ENCOUNTER — Inpatient Hospital Stay (HOSPITAL_COMMUNITY)
Admission: EM | Admit: 2018-09-06 | Discharge: 2018-09-10 | DRG: 246 | Disposition: A | Discharge status: Skilled Nursing Facility | Payer: Medicare Other | Attending: Cardiology | Admitting: Cardiology

## 2018-09-06 ENCOUNTER — Encounter (HOSPITAL_COMMUNITY): Payer: Self-pay

## 2018-09-06 ENCOUNTER — Emergency Department (HOSPITAL_COMMUNITY): Payer: Medicare Other

## 2018-09-06 DIAGNOSIS — E118 Type 2 diabetes mellitus with unspecified complications: Secondary | ICD-10-CM

## 2018-09-06 DIAGNOSIS — N401 Enlarged prostate with lower urinary tract symptoms: Secondary | ICD-10-CM | POA: Diagnosis present

## 2018-09-06 DIAGNOSIS — E1151 Type 2 diabetes mellitus with diabetic peripheral angiopathy without gangrene: Secondary | ICD-10-CM | POA: Diagnosis present

## 2018-09-06 DIAGNOSIS — Y832 Surgical operation with anastomosis, bypass or graft as the cause of abnormal reaction of the patient, or of later complication, without mention of misadventure at the time of the procedure: Secondary | ICD-10-CM | POA: Diagnosis present

## 2018-09-06 DIAGNOSIS — Z794 Long term (current) use of insulin: Secondary | ICD-10-CM

## 2018-09-06 DIAGNOSIS — J45909 Unspecified asthma, uncomplicated: Secondary | ICD-10-CM | POA: Diagnosis present

## 2018-09-06 DIAGNOSIS — K219 Gastro-esophageal reflux disease without esophagitis: Secondary | ICD-10-CM | POA: Diagnosis present

## 2018-09-06 DIAGNOSIS — I739 Peripheral vascular disease, unspecified: Secondary | ICD-10-CM | POA: Diagnosis present

## 2018-09-06 DIAGNOSIS — Z951 Presence of aortocoronary bypass graft: Secondary | ICD-10-CM | POA: Diagnosis not present

## 2018-09-06 DIAGNOSIS — I509 Heart failure, unspecified: Secondary | ICD-10-CM

## 2018-09-06 DIAGNOSIS — T82857A Stenosis of cardiac prosthetic devices, implants and grafts, initial encounter: Secondary | ICD-10-CM | POA: Diagnosis present

## 2018-09-06 DIAGNOSIS — I272 Pulmonary hypertension, unspecified: Secondary | ICD-10-CM | POA: Diagnosis present

## 2018-09-06 DIAGNOSIS — I110 Hypertensive heart disease with heart failure: Secondary | ICD-10-CM | POA: Diagnosis present

## 2018-09-06 DIAGNOSIS — Z79899 Other long term (current) drug therapy: Secondary | ICD-10-CM

## 2018-09-06 DIAGNOSIS — I472 Ventricular tachycardia: Secondary | ICD-10-CM | POA: Diagnosis present

## 2018-09-06 DIAGNOSIS — N400 Enlarged prostate without lower urinary tract symptoms: Secondary | ICD-10-CM | POA: Diagnosis present

## 2018-09-06 DIAGNOSIS — I2510 Atherosclerotic heart disease of native coronary artery without angina pectoris: Secondary | ICD-10-CM | POA: Diagnosis present

## 2018-09-06 DIAGNOSIS — I214 Non-ST elevation (NSTEMI) myocardial infarction: Secondary | ICD-10-CM | POA: Diagnosis present

## 2018-09-06 DIAGNOSIS — E876 Hypokalemia: Secondary | ICD-10-CM | POA: Diagnosis not present

## 2018-09-06 DIAGNOSIS — I25810 Atherosclerosis of coronary artery bypass graft(s) without angina pectoris: Secondary | ICD-10-CM | POA: Diagnosis not present

## 2018-09-06 DIAGNOSIS — T502X5A Adverse effect of carbonic-anhydrase inhibitors, benzothiadiazides and other diuretics, initial encounter: Secondary | ICD-10-CM | POA: Diagnosis present

## 2018-09-06 DIAGNOSIS — H9190 Unspecified hearing loss, unspecified ear: Secondary | ICD-10-CM | POA: Diagnosis present

## 2018-09-06 DIAGNOSIS — E785 Hyperlipidemia, unspecified: Secondary | ICD-10-CM | POA: Diagnosis present

## 2018-09-06 DIAGNOSIS — Z7982 Long term (current) use of aspirin: Secondary | ICD-10-CM

## 2018-09-06 DIAGNOSIS — I5043 Acute on chronic combined systolic (congestive) and diastolic (congestive) heart failure: Secondary | ICD-10-CM | POA: Diagnosis present

## 2018-09-06 DIAGNOSIS — Z7902 Long term (current) use of antithrombotics/antiplatelets: Secondary | ICD-10-CM

## 2018-09-06 DIAGNOSIS — I255 Ischemic cardiomyopathy: Secondary | ICD-10-CM | POA: Diagnosis present

## 2018-09-06 DIAGNOSIS — R338 Other retention of urine: Secondary | ICD-10-CM | POA: Diagnosis present

## 2018-09-06 DIAGNOSIS — Z888 Allergy status to other drugs, medicaments and biological substances status: Secondary | ICD-10-CM

## 2018-09-06 DIAGNOSIS — I5042 Chronic combined systolic (congestive) and diastolic (congestive) heart failure: Secondary | ICD-10-CM | POA: Diagnosis present

## 2018-09-06 DIAGNOSIS — Z7951 Long term (current) use of inhaled steroids: Secondary | ICD-10-CM

## 2018-09-06 DIAGNOSIS — E875 Hyperkalemia: Secondary | ICD-10-CM | POA: Diagnosis present

## 2018-09-06 DIAGNOSIS — F0390 Unspecified dementia without behavioral disturbance: Secondary | ICD-10-CM | POA: Diagnosis present

## 2018-09-06 DIAGNOSIS — I5023 Acute on chronic systolic (congestive) heart failure: Secondary | ICD-10-CM

## 2018-09-06 DIAGNOSIS — R339 Retention of urine, unspecified: Secondary | ICD-10-CM

## 2018-09-06 DIAGNOSIS — IMO0001 Reserved for inherently not codable concepts without codable children: Secondary | ICD-10-CM

## 2018-09-06 HISTORY — DX: Non-ST elevation (NSTEMI) myocardial infarction: I21.4

## 2018-09-06 LAB — CBC
HEMATOCRIT: 39.4 % (ref 39.0–52.0)
HEMOGLOBIN: 12.2 g/dL — AB (ref 13.0–17.0)
MCH: 28.9 pg (ref 26.0–34.0)
MCHC: 31 g/dL (ref 30.0–36.0)
MCV: 93.4 fL (ref 80.0–100.0)
NRBC: 0 % (ref 0.0–0.2)
Platelets: 230 10*3/uL (ref 150–400)
RBC: 4.22 MIL/uL (ref 4.22–5.81)
RDW: 15.1 % (ref 11.5–15.5)
WBC: 10.7 10*3/uL — ABNORMAL HIGH (ref 4.0–10.5)

## 2018-09-06 LAB — I-STAT TROPONIN, ED: Troponin i, poc: 1.63 ng/mL (ref 0.00–0.08)

## 2018-09-06 LAB — TROPONIN I
TROPONIN I: 2.33 ng/mL — AB (ref <0.03)
Troponin I: 2.37 ng/mL (ref <0.03)

## 2018-09-06 LAB — BASIC METABOLIC PANEL
ANION GAP: 15 (ref 5–15)
BUN: 28 mg/dL — ABNORMAL HIGH (ref 8–23)
CALCIUM: 9.1 mg/dL (ref 8.9–10.3)
CO2: 23 mmol/L (ref 22–32)
Chloride: 99 mmol/L (ref 98–111)
Creatinine, Ser: 1.22 mg/dL (ref 0.61–1.24)
GFR, EST AFRICAN AMERICAN: 58 mL/min — AB (ref >60)
GFR, EST NON AFRICAN AMERICAN: 50 mL/min — AB (ref >60)
GLUCOSE: 94 mg/dL (ref 70–99)
Potassium: 4.3 mmol/L (ref 3.5–5.1)
Sodium: 137 mmol/L (ref 135–145)

## 2018-09-06 LAB — GLUCOSE, CAPILLARY
GLUCOSE-CAPILLARY: 111 mg/dL — AB (ref 70–99)
Glucose-Capillary: 117 mg/dL — ABNORMAL HIGH (ref 70–99)

## 2018-09-06 LAB — HEPARIN LEVEL (UNFRACTIONATED): HEPARIN UNFRACTIONATED: 0.64 [IU]/mL (ref 0.30–0.70)

## 2018-09-06 LAB — BRAIN NATRIURETIC PEPTIDE: B Natriuretic Peptide: 2072.5 pg/mL — ABNORMAL HIGH (ref 0.0–100.0)

## 2018-09-06 LAB — MRSA PCR SCREENING: MRSA BY PCR: NEGATIVE

## 2018-09-06 LAB — MAGNESIUM: Magnesium: 1.1 mg/dL — ABNORMAL LOW (ref 1.7–2.4)

## 2018-09-06 MED ORDER — INSULIN ASPART 100 UNIT/ML ~~LOC~~ SOLN
0.0000 [IU] | Freq: Three times a day (TID) | SUBCUTANEOUS | Status: DC
  Administered 2018-09-08 (×2): 2 [IU] via SUBCUTANEOUS
  Administered 2018-09-09 (×3): 3 [IU] via SUBCUTANEOUS
  Administered 2018-09-10: 06:00:00 2 [IU] via SUBCUTANEOUS
  Administered 2018-09-10: 5 [IU] via SUBCUTANEOUS

## 2018-09-06 MED ORDER — PANTOPRAZOLE SODIUM 40 MG PO TBEC
40.0000 mg | DELAYED_RELEASE_TABLET | Freq: Every day | ORAL | Status: DC
  Administered 2018-09-07 – 2018-09-10 (×4): 40 mg via ORAL
  Filled 2018-09-06 (×5): qty 1

## 2018-09-06 MED ORDER — ACETAMINOPHEN 325 MG PO TABS
650.0000 mg | ORAL_TABLET | ORAL | Status: DC | PRN
  Administered 2018-09-07: 650 mg via ORAL
  Filled 2018-09-06: qty 2

## 2018-09-06 MED ORDER — NITROGLYCERIN 0.4 MG SL SUBL: 0.4000 mg | SUBLINGUAL_TABLET | SUBLINGUAL | Status: DC | PRN

## 2018-09-06 MED ORDER — HEPARIN BOLUS VIA INFUSION
4000.0000 [IU] | Freq: Once | INTRAVENOUS | Status: AC
  Administered 2018-09-06: 4000 [IU] via INTRAVENOUS
  Filled 2018-09-06: qty 4000

## 2018-09-06 MED ORDER — ALBUTEROL SULFATE (2.5 MG/3ML) 0.083% IN NEBU
2.5000 mg | INHALATION_SOLUTION | Freq: Four times a day (QID) | RESPIRATORY_TRACT | Status: DC | PRN
  Administered 2018-09-06: 2.5 mg via RESPIRATORY_TRACT
  Filled 2018-09-06: qty 3

## 2018-09-06 MED ORDER — MOMETASONE FURO-FORMOTEROL FUM 100-5 MCG/ACT IN AERO
2.0000 | INHALATION_SPRAY | Freq: Two times a day (BID) | RESPIRATORY_TRACT | Status: DC
  Administered 2018-09-06 – 2018-09-10 (×6): 2 via RESPIRATORY_TRACT
  Filled 2018-09-06 (×3): qty 8.8

## 2018-09-06 MED ORDER — ONDANSETRON HCL 4 MG/2ML IJ SOLN
4.0000 mg | Freq: Four times a day (QID) | INTRAMUSCULAR | Status: DC | PRN
  Filled 2018-09-06: qty 2

## 2018-09-06 MED ORDER — IPRATROPIUM-ALBUTEROL 0.5-2.5 (3) MG/3ML IN SOLN
3.0000 mL | Freq: Once | RESPIRATORY_TRACT | Status: AC
  Administered 2018-09-06: 3 mL via RESPIRATORY_TRACT
  Filled 2018-09-06: qty 3

## 2018-09-06 MED ORDER — FINASTERIDE 5 MG PO TABS
5.0000 mg | ORAL_TABLET | Freq: Every day | ORAL | Status: DC
  Administered 2018-09-07 – 2018-09-10 (×4): 5 mg via ORAL
  Filled 2018-09-06 (×4): qty 1

## 2018-09-06 MED ORDER — ASPIRIN EC 81 MG PO TBEC
81.0000 mg | DELAYED_RELEASE_TABLET | Freq: Every day | ORAL | Status: DC
  Administered 2018-09-07: 81 mg via ORAL
  Filled 2018-09-06: qty 1

## 2018-09-06 MED ORDER — CARVEDILOL 3.125 MG PO TABS
3.1250 mg | ORAL_TABLET | Freq: Two times a day (BID) | ORAL | Status: DC
  Administered 2018-09-06 – 2018-09-07 (×2): 3.125 mg via ORAL
  Filled 2018-09-06 (×2): qty 1

## 2018-09-06 MED ORDER — ISOSORBIDE MONONITRATE ER 30 MG PO TB24
15.0000 mg | ORAL_TABLET | Freq: Every day | ORAL | Status: DC
  Administered 2018-09-07 – 2018-09-10 (×4): 15 mg via ORAL
  Filled 2018-09-06 (×4): qty 1

## 2018-09-06 MED ORDER — ROSUVASTATIN CALCIUM 10 MG PO TABS
10.0000 mg | ORAL_TABLET | Freq: Every day | ORAL | Status: DC
  Administered 2018-09-07 – 2018-09-10 (×4): 10 mg via ORAL
  Filled 2018-09-06: qty 1
  Filled 2018-09-06 (×2): qty 2
  Filled 2018-09-06: qty 1

## 2018-09-06 MED ORDER — LISINOPRIL 5 MG PO TABS
2.5000 mg | ORAL_TABLET | Freq: Every day | ORAL | Status: DC
  Administered 2018-09-07 – 2018-09-10 (×4): 2.5 mg via ORAL
  Filled 2018-09-06 (×4): qty 1

## 2018-09-06 MED ORDER — CLOPIDOGREL BISULFATE 75 MG PO TABS
75.0000 mg | ORAL_TABLET | Freq: Every day | ORAL | Status: DC
  Administered 2018-09-07 – 2018-09-08 (×2): 75 mg via ORAL
  Filled 2018-09-06 (×2): qty 1

## 2018-09-06 MED ORDER — HEPARIN SODIUM (PORCINE) 5000 UNIT/ML IJ SOLN
4000.0000 [IU] | Freq: Once | INTRAMUSCULAR | Status: DC
  Filled 2018-09-06: qty 1

## 2018-09-06 MED ORDER — HEPARIN (PORCINE) 25000 UT/250ML-% IV SOLN
900.0000 [IU]/h | INTRAVENOUS | Status: DC
  Administered 2018-09-06: 900 [IU]/h via INTRAVENOUS
  Filled 2018-09-06 (×2): qty 250

## 2018-09-06 MED ORDER — FUROSEMIDE 10 MG/ML IJ SOLN
40.0000 mg | Freq: Two times a day (BID) | INTRAMUSCULAR | Status: DC
  Administered 2018-09-06 – 2018-09-07 (×2): 40 mg via INTRAVENOUS
  Filled 2018-09-06: qty 4

## 2018-09-06 MED ORDER — EZETIMIBE 10 MG PO TABS
10.0000 mg | ORAL_TABLET | Freq: Every day | ORAL | Status: DC
  Administered 2018-09-07 – 2018-09-10 (×4): 10 mg via ORAL
  Filled 2018-09-06 (×4): qty 1

## 2018-09-06 NOTE — ED Provider Notes (Signed)
[facility] EMERGENCY DEPARTMENT Provider Note   CSN: [id number] Arrival date & time: 09/06/18  1231     History   Chief Complaint Chief Complaint  Patient presents with  . Shortness of Breath    HPI [name]. is a 84 y.o. male.  84 year old male with history of dementia, insulin-dependent diabetes, congestive heart failure, coronary artery disease, asthma brought in by EMS from home for dyspnea on exertion as well as shortness of breath lying flat in bed.  Patient typically sleeps with 2 pillows, has had to sleep with 4 pillows for the past 3 nights.  Patient's wife is not here currently, EMS states that wife gives patient an extra dose of Lasix at night when he has shortness of breath however this has not helped, has also used his albuterol inhaler without relief.  Patient denies chest pain or any other complaints or concerns.     Past Medical History:  Diagnosis Date  . Acute CHF ([place]) 09/15/2012  . Asthma    pt denies this hx on 11/02/2013  . BPH (benign prostatic hyperplasia)   . CAD (coronary artery disease)    CABG '97, SVG DES 12/13  . Colonic polyp   . Exertional shortness of breath   . GERD (gastroesophageal reflux disease)   . Hearing loss   . History of pancreatitis    pt denies this hx on 11/02/2013  . Hyperlipidemia   . Hypertension   . Intrinsic asthma 06/18/2007   Qualifier: Diagnosis of  By: Dance CMA ([place]), Kim    . Ischemic cardiomyopathy 09/16/2012   EF 25%- improved to 45% 3/14  . Myocardial infarction ([place]) 1997  . Obstructed, uropathy   . PVD (peripheral vascular disease) (HCC)    moderate bilat carotid disease  . Syncope and collapse 11/02/2013   "didn't pass out" (11/02/2013)  . Type II diabetes mellitus [facility])     Patient Active Problem List   Diagnosis Date Noted  . CAD (coronary artery disease) 01/13/2017  . Dementia ([place]) 11/26/2016  . Essential hypertension 07/30/2016  . PCP NOTES >>>>>>>>> 11/28/2015  .  Chronic combined systolic and diastolic CHF (congestive heart failure) ([place]) 07/08/2015  . Fatigue 06/01/2015  . Annual physical exam 08/04/2014  . Carotid artery disease ([place]) 12/25/2013  . Syncope 11/02/2013  . Left facial numbness 11/02/2013  . Sleep apnea- C-pap   . PVD- moderate carotid disease   . ICM, EF 20-25% by cath 09/16/12- improved to 45 % by Echo 3/14 09/16/2012  . S/P angioplasty with stent  09/16/2012  . Type 2 diabetes mellitus with circulatory disorder, with long-term current use of insulin ([place]) 09/15/2012  . BENIGN POSITIONAL VERTIGO 03/09/2010  . 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 Hyperlipidemia 06/18/2007  . Hx of CABG 06/18/2007  . ALLERGIC RHINITIS 06/18/2007  . Asthma, chronic 06/18/2007  . BENIGN PROSTATIC HYPERTROPHY 06/18/2007  . PANCREATITIS, HX OF 06/18/2007  . COLONIC POLYPS, HX OF 06/18/2007    Past Surgical History:  Procedure Laterality Date  . CATARACT EXTRACTION W/ INTRAOCULAR LENS  IMPLANT, BILATERAL Bilateral ~ 1970  . CHOLECYSTECTOMY  2000's  . CORONARY ANGIOPLASTY WITH STENT PLACEMENT  Dec 2013   DES to SVG-CFX/PDA  . CORONARY ARTERY BYPASS GRAFT  1997   "CABG X 5"  . INGUINAL HERNIA REPAIR Bilateral ~ 1950  . LEFT HEART CATHETERIZATION WITH CORONARY/GRAFT ANGIOGRAM N/A 09/16/2012   Procedure: LEFT HEART CATHETERIZATION WITH Beatrix Fetters;  Surgeon: Lorretta Harp, MD;  Location:  [facility] CATH LAB;  Service: Cardiovascular;  Laterality: N/A;  . NASAL SEPTUM SURGERY  2007  . RETINAL DETACHMENT SURGERY Left ?1995  . SHOULDER OPEN ROTATOR CUFF REPAIR Right         Home Medications    Prior to Admission medications   Medication Sig Start Date End Date Taking? Authorizing Provider  acetaminophen (TYLENOL) 500 MG tablet Take 1,000 mg by mouth every 6 (six) hours as needed for headache (pain).    [provider]  albuterol (PROVENTIL HFA;VENTOLIN HFA) 108 (90 Base) MCG/ACT inhaler Inhale 2 puffs into the lungs every  6 (six) hours as needed for wheezing or shortness of breath. 08/29/18   Colon Branch, MD  Alcohol Swabs (ALCOHOL PREP) 70 % PADS Reported on 04/30/2016 02/23/14   [provider]  aspirin EC 81 MG EC tablet Take 2 tablets (162 mg total) by mouth daily. 09/18/12   Erlene Quan, PA-C  azelastine (ASTELIN) 0.1 % nasal spray Place 2 sprays into both nostrils at bedtime. Use in each nostril as directed Patient not taking: Reported on 07/08/2018 12/06/17   Colon Branch, MD  Blood Glucose Monitoring Suppl (BLOOD GLUCOSE METER) kit Use as instructed 04/08/13   Norins, Heinz Knuckles, MD  clopidogrel (PLAVIX) 75 MG tablet TAKE 1 TABLET DAILY WITH   BREAKFAST. KEEP OFFICE     VISIT. 06/30/18   Lorretta Harp, MD  ezetimibe (ZETIA) 10 MG tablet TAKE 1 TABLET DAILY. KEEP  OFFICE VISIT 06/30/18   Lorretta Harp, MD  finasteride (PROSCAR) 5 MG tablet Take 5 mg by mouth daily.    [provider]  Fluticasone-Salmeterol (ADVAIR) 100-50 MCG/DOSE AEPB Inhale 1 puff into the lungs 2 (two) times daily. 08/09/17   Colon Branch, MD  furosemide (LASIX) 40 MG tablet TAKE 1 TABLET BY MOUTH EVERY DAY 04/07/18   Lorretta Harp, MD  glucose blood test strip Use to test blood sugars twice daily 02/01/14   Norins, Heinz Knuckles, MD  Grape Seed 100 MG CAPS Take 200 mg by mouth daily.     [provider]  Insulin Glargine (BASAGLAR KWIKPEN) 100 UNIT/ML SOPN Inject 0.17 mLs (17 Units total) into the skin at bedtime. 12/20/17   Colon Branch, MD  Insulin Pen Needle 32G X 4 MM MISC To use w/ Basaglar 11/29/15   Colon Branch, MD  isosorbide mononitrate (IMDUR) 30 MG 24 hr tablet Take 0.5 tablets (15 mg total) by mouth daily. 05/04/16   Lorretta Harp, MD  Lancet Devices (ADJUSTABLE LANCING DEVICE) MISC Reported on 04/30/2016 02/23/14   [provider]  lisinopril (PRINIVIL,ZESTRIL) 2.5 MG tablet TAKE 1 TABLET DAILY. KEEP  OFFICE VISIT. 06/30/18   Lorretta Harp, MD  metFORMIN (GLUCOPHAGE) 850 MG tablet Take 1.5  tablets by mouth twice daily with meal 02/07/18   Colon Branch, MD  metoprolol tartrate (LOPRESSOR) 25 MG tablet Take 0.5 tablets (12.5 mg total) by mouth 2 (two) times daily. 06/30/18   Lorretta Harp, MD  mometasone (NASONEX) 50 MCG/ACT nasal spray Place 2 sprays into the nose daily. 12/06/17   Colon Branch, MD  nitroGLYCERIN (NITROSTAT) 0.4 MG SL tablet Place 1 tablet (0.4 mg total) under the tongue every 5 (five) minutes x 3 doses as needed for chest pain. Patient not taking: Reported on 04/08/2018 02/04/18   Colon Branch, MD  pantoprazole (PROTONIX) 40 MG tablet TAKE 1 TABLET DAILY. KEEP  OFFICE VISIT. 06/30/18   Quay Burow  J, MD  Propylene Glycol (SYSTANE BALANCE OP) Place 1 drop into both eyes daily as needed (dry eyes).     [provider]  rosuvastatin (CRESTOR) 10 MG tablet TAKE 1 TABLET DAILY. 06/30/18   Lorretta Harp, MD    Family History Family History  Problem Relation Age of Onset  . Prostate cancer Brother        dx ~ 49 y/o  . Colon cancer Neg Hx     Social History Social History   Tobacco Use  . Smoking status: Never Smoker  . Smokeless tobacco: Never Used  . Tobacco comment: 11/02/2013 "smoked cigars when I was 18 or so; didn't smoke many"  Substance Use Topics  . Alcohol use: Yes    Comment: Occasional  . Drug use: No     Allergies   Ativan [lorazepam]; Colesevelam; and Ezetimibe-simvastatin   Review of Systems Review of Systems  Unable to perform ROS: Dementia  Constitutional: Negative for chills, diaphoresis and fever.  Respiratory: Positive for shortness of breath. Negative for cough and chest tightness.   Cardiovascular: Positive for leg swelling. Negative for chest pain.  Gastrointestinal: Negative for abdominal pain.  Skin: Negative for rash and wound.     Physical Exam Updated Vital Signs BP 127/70   Pulse 84   Temp (!) 97.4 F (36.3 C) (Oral)   Resp (!) 24   Ht _0  (1.727 m)   Wt 75 kg   SpO2 95%   BMI 25.14 kg/m    Physical Exam  Constitutional: He is oriented to person, place, and time. He appears well-developed and well-nourished. No distress.  HENT:  Head: Normocephalic and atraumatic.  Mouth/Throat: Oropharynx is clear and moist.  Cardiovascular: Normal rate and intact distal pulses.  No murmur heard. Pulmonary/Chest: Effort normal. Tachypnea noted. No respiratory distress. He has decreased breath sounds. He has wheezes. He exhibits no tenderness.  Slightly diminished lung sounds at bases, mild wheezing full fields.   Abdominal: Soft. There is no tenderness.  Musculoskeletal:       Right lower leg: He exhibits edema. He exhibits no tenderness.       Left lower leg: He exhibits edema. He exhibits no tenderness.  Mild pitting edema bilateral lower legs  Neurological: He is alert and oriented to person, place, and time.  Skin: Skin is warm and dry. He is not diaphoretic.  Psychiatric: He has a normal mood and affect. His behavior is normal.  Nursing note and vitals reviewed.    ED Treatments / Results  Labs (all labs ordered are listed, but only abnormal results are displayed) Labs Reviewed  BASIC METABOLIC PANEL - Abnormal; Notable for the following components:      Result Value   BUN 28 (*)    GFR calc non Af Amer 50 (*)    GFR calc Af Amer 58 (*)    All other components within normal limits  MAGNESIUM - Abnormal; Notable for the following components:   Magnesium 1.1 (*)    All other components within normal limits  BRAIN NATRIURETIC PEPTIDE - Abnormal; Notable for the following components:   B Natriuretic Peptide 2,072.5 (*)    All other components within normal limits  CBC - Abnormal; Notable for the following components:   WBC 10.7 (*)    Hemoglobin 12.2 (*)    All other components within normal limits  I-STAT TROPONIN, ED - Abnormal; Notable for the following components:   Troponin i, poc 1.63 (*)  All other components within normal limits  HEPARIN LEVEL (UNFRACTIONATED)     EKG EKG Interpretation  Date/Time:  Saturday September 06 2018 12:44:43 EST Ventricular Rate:  82 PR Interval:    QRS Duration: 100 QT Interval:  386 QTC Calculation: 451 R Axis:   26 Text Interpretation:  Sinus rhythm Repol abnrm suggests ischemia, lateral leads New since previous tracing Confirmed by Fredia Sorrow [phone]) on 09/06/2018 12:59:28 PM   Radiology Dg Chest Portable 1 View  Result Date: 09/06/2018 CLINICAL DATA:  Increasing shortness of breath over 3 days, orthopnea, history CHF, coronary artery disease post MI, asthma, hypertension, type II diabetes mellitus EXAM: PORTABLE CHEST 1 VIEW COMPARISON:  Portable exam 1250 hours compared to 03/05/2018 FINDINGS: Enlargement of cardiac silhouette post CABG. Atherosclerotic calcification aorta. Mediastinal contours and pulmonary vascularity normal. Lungs clear. Probable tiny pleural effusions. No pneumothorax. Bones demineralized. IMPRESSION: Enlargement of cardiac silhouette post CABG. Tiny bibasilar effusions. Electronically Signed   By: Lavonia Dana M.D.   On: 09/06/2018 13:24    Procedures .Critical Care Performed by: Tacy Learn, PA-C Authorized by: Tacy Learn, PA-C   Critical care provider statement:    Critical care time (minutes):  45   Critical care was necessary to treat or prevent imminent or life-threatening deterioration of the following conditions:  Cardiac failure and circulatory failure   Critical care was time spent personally by me on the following activities:  Discussions with consultants, evaluation of patient's response to treatment, examination of patient, ordering and performing treatments and interventions, ordering and review of laboratory studies, ordering and review of radiographic studies, pulse oximetry, re-evaluation of patient's condition, obtaining history from patient or surrogate and review of old charts   I assumed direction of critical care for this patient from another provider in  my specialty: no     (including critical care time)  Medications Ordered in ED Medications  heparin ADULT infusion 100 units/mL (25000 units/282m sodium chloride 0.45%) (900 Units/hr Intravenous New Bag/Given 09/06/18 1412)  ipratropium-albuterol (DUONEB) 0.5-2.5 (3) MG/3ML nebulizer solution 3 mL (3 mLs Nebulization Given 09/06/18 1303)  heparin bolus via infusion 4,000 Units (4,000 Units Intravenous Bolus from Bag 09/06/18 1414)     Initial Impression / Assessment and Plan / ED Course  I have reviewed the triage vital signs and the nursing notes.  Pertinent labs & imaging results that were available during my care of the patient were reviewed by me and considered in my medical decision making (see chart for details).  Clinical Course as of Sep 06 1450  Sat Sep 06, 2018  1404 84yo [facility], worse with exertion and lying supine (increase from 2 to 4 pillows). History of stent 2013, CABG 1997, CHF, diabetes. On exam, mild pitting edema bilateral lower legs, lung sounds diminished with mild wheezing, given neb. EKG with t-wave inversion appears changed from previous, trop elevated at 1.63 (I-stat), BNP elevated at 2,072, Mg 1.1, Cr 1.22. Patient seen by Dr. ZRogene Houston ER attending, given heparin bolus. Consult to cardiology with Dr. CSallyanne Kusterwho will see the patient.    [LM]    Clinical Course User Index [LM] MTacy Learn PA-C   Final Clinical Impressions(s) / ED Diagnoses   Final diagnoses:  NSTEMI (non-ST elevated myocardial infarction) ([place]  Congestive heart failure, unspecified HF chronicity, unspecified heart failure type ([facility]    ED Discharge Orders    None       MPercell Miller  Hewitt Shorts, PA-C 09/06/18 1452    Fredia Sorrow, MD 09/06/18 [phone]

## 2018-09-06 NOTE — Progress Notes (Signed)
ANTICOAGULATION CONSULT NOTE -   Pharmacy Consult for Heparin Indication: chest pain/ACS  Allergies  Allergen Reactions  . Ativan [Lorazepam] Other (See Comments)    "Made him crazy"  . Colesevelam Other (See Comments)    Pancreatitis (??)  . Ezetimibe-Simvastatin Other (See Comments)    Vytorin caused pancreatitis    Patient Measurements: Height: 5\' 6"  (167.6 cm) Weight: 165 lb 5.5 oz (75 kg) IBW/kg (Calculated) : 63.8 Heparin Dosing Weight: 75kg  Vital Signs: Temp: 97.6 F (36.4 C) (11/23 2310) Temp Source: Oral (11/23 2310) BP: 132/77 (11/23 2310) Pulse Rate: 85 (11/23 2310)  Labs: Recent Labs    09/06/18 1250 09/06/18 1700 09/06/18 2241  HGB 12.2*  --   --   HCT 39.4  --   --   PLT 230  --   --   HEPARINUNFRC  --   --  0.64  CREATININE 1.22  --   --   TROPONINI  --  2.37* 2.33*    Estimated Creatinine Clearance: 36.3 mL/min (by C-G formula based on SCr of 1.22 mg/dL).   Medical History: Past Medical History:  Diagnosis Date  . Acute CHF (HCC) 09/15/2012  . Asthma    pt denies this hx on 11/02/2013  . BPH (benign prostatic hyperplasia)   . CAD (coronary artery disease)    CABG '97, SVG DES 12/13  . Colonic polyp   . Exertional shortness of breath   . GERD (gastroesophageal reflux disease)   . Hearing loss   . History of pancreatitis    pt denies this hx on 11/02/2013  . Hyperlipidemia   . Hypertension   . Intrinsic asthma 06/18/2007   Qualifier: Diagnosis of  By: Dance CMA (AAMA), Kim    . Ischemic cardiomyopathy 09/16/2012   EF 25%- improved to 45% 3/14  . Myocardial infarction (HCC) 1997  . Obstructed, uropathy   . PVD (peripheral vascular disease) (HCC)    moderate bilat carotid disease  . Syncope and collapse 11/02/2013   "didn't pass out" (11/02/2013)  . Type II diabetes mellitus (HCC)     Medications:  Scheduled:  . [START ON 09/07/2018] aspirin EC  81 mg Oral Daily  . carvedilol  3.125 mg Oral BID WC  . [START ON 09/07/2018] clopidogrel   75 mg Oral Daily  . [START ON 09/07/2018] ezetimibe  10 mg Oral Daily  . [START ON 09/07/2018] finasteride  5 mg Oral Daily  . furosemide  40 mg Intravenous BID  . insulin aspart  0-15 Units Subcutaneous TID WC  . [START ON 09/07/2018] isosorbide mononitrate  15 mg Oral Daily  . [START ON 09/07/2018] lisinopril  2.5 mg Oral Daily  . mometasone-formoterol  2 puff Inhalation BID  . [START ON 09/07/2018] pantoprazole  40 mg Oral QAC breakfast  . [START ON 09/07/2018] rosuvastatin  10 mg Oral Daily    Assessment: 89 YOM presenting with increasing SOB, elevated troponin and pharmacy consulted to start heparin.  On DAPT but no AC PTA, CBC stable.    11/23 PM update: heparin level tonight is therapeutic   Goal of Therapy:  Heparin level 0.3-0.7 units/ml Monitor platelets by anticoagulation protocol: Yes   Plan:  Cont heparin at 900 units/hr Confirmatory heparin level with AM labs Monitor daily heparin level, CBC, s/s bleeding  Abran Duke, PharmD, BCPS Clinical Pharmacist Phone: [phone]

## 2018-09-06 NOTE — ED Notes (Signed)
Dr. Deretha Emory and Laura-PA notified of elevated I-stat trop of 1.63

## 2018-09-06 NOTE — H&P (Signed)
Cardiology Admission History and Physical:   Patient ID: [name]. MRN: [id number]; DOB: 01/07/1926   Admission date: 09/06/2018  Primary Care Provider: Colon Branch, MD Primary Cardiologist: Dr Gwenlyn Found  Chief Complaint: Dyspnea/acute on chronic systolic congestive heart failure/non-ST elevation myocardial infarction  Patient Profile:   [name]. is a 84 y.o. male with past medical history of hypertension, diabetes mellitus, hyperlipidemia coronary artery disease status post coronary artery bypass and graft in 1997 with a LIMA to the LAD, saphenous vein graft to the first diagonal, saphenous vein graft to the obtuse marginal and saphenous vein graft to the PDA admitted with acute on chronic systolic congestive heart failure, non-ST elevation microinfarction.  Last cardiac catheterization December 2013 showed severe three-vessel coronary disease, patent LIMA to the LAD, patent saphenous vein graft to the diagonal, patent saphenous vein graft to the obtuse marginal and saphenous vein graft to the PDA had a 99% proximal lesion.  Patient had PCI of the vein graft to the PDA.  Nuclear study August 2017 showed ejection fraction 41%, prior infarct but no ischemia. Last echocardiogram May 2019 showed ejection fraction 25 to 30%, moderate diastolic dysfunction, mild aortic insufficiency, mild mitral regurgitation, severe biatrial enlargement, mild right ventricular enlargement and moderate pulmonary hypertension.  History of Present Illness:   Patient was recently told that his sodium was low at 129.  He therefore began eating increased salt at home.  Over the past 2 weeks he has had worsening dyspnea on exertion.  He denies orthopnea, PND, pedal edema, chest pain, palpitations or syncope.  His wife gave him extra Lasix at home but his symptoms continue to worsen and he presents to the emergency room for further evaluation.   Past Medical History:  Diagnosis Date  . Acute CHF ([place])  09/15/2012  . Asthma    pt denies this hx on 11/02/2013  . BPH (benign prostatic hyperplasia)   . CAD (coronary artery disease)    CABG '97, SVG DES 12/13  . Colonic polyp   . Exertional shortness of breath   . GERD (gastroesophageal reflux disease)   . Hearing loss   . History of pancreatitis    pt denies this hx on 11/02/2013  . Hyperlipidemia   . Hypertension   . Intrinsic asthma 06/18/2007   Qualifier: Diagnosis of  By: Dance CMA ([place]), Kim    . Ischemic cardiomyopathy 09/16/2012   EF 25%- improved to 45% 3/14  . Myocardial infarction ([place]) 1997  . Obstructed, uropathy   . PVD (peripheral vascular disease) (HCC)    moderate bilat carotid disease  . Syncope and collapse 11/02/2013   "didn't pass out" (11/02/2013)  . Type II diabetes mellitus ([place])     Past Surgical History:  Procedure Laterality Date  . CATARACT EXTRACTION W/ INTRAOCULAR LENS  IMPLANT, BILATERAL Bilateral ~ 1970  . CHOLECYSTECTOMY  2000's  . CORONARY ANGIOPLASTY WITH STENT PLACEMENT  Dec 2013   DES to SVG-CFX/PDA  . CORONARY ARTERY BYPASS GRAFT  1997   "CABG X 5"  . INGUINAL HERNIA REPAIR Bilateral ~ 1950  . LEFT HEART CATHETERIZATION WITH CORONARY/GRAFT ANGIOGRAM N/A 09/16/2012   Procedure: LEFT HEART CATHETERIZATION WITH Beatrix Fetters;  Surgeon: Lorretta Harp, MD;  Location: [facility] CATH LAB;  Service: Cardiovascular;  Laterality: N/A;  . NASAL SEPTUM SURGERY  2007  . RETINAL DETACHMENT SURGERY Left ?1995  . SHOULDER OPEN ROTATOR CUFF REPAIR Right      Medications Prior to Admission: Prior to Admission  medications   Medication Sig Start Date End Date Taking? Authorizing Provider  acetaminophen (TYLENOL) 500 MG tablet Take 1,000 mg by mouth every 6 (six) hours as needed for headache (pain).    [provider]  albuterol (PROVENTIL HFA;VENTOLIN HFA) 108 (90 Base) MCG/ACT inhaler Inhale 2 puffs into the lungs every 6 (six) hours as needed for wheezing or shortness of breath. 08/29/18   Colon Branch, MD  Alcohol Swabs (ALCOHOL PREP) 70 % PADS Reported on 04/30/2016 02/23/14   [provider]  aspirin EC 81 MG EC tablet Take 2 tablets (162 mg total) by mouth daily. 09/18/12   Erlene Quan, PA-C  azelastine (ASTELIN) 0.1 % nasal spray Place 2 sprays into both nostrils at bedtime. Use in each nostril as directed Patient not taking: Reported on 07/08/2018 12/06/17   Colon Branch, MD  Blood Glucose Monitoring Suppl (BLOOD GLUCOSE METER) kit Use as instructed 04/08/13   Norins, Heinz Knuckles, MD  clopidogrel (PLAVIX) 75 MG tablet TAKE 1 TABLET DAILY WITH   BREAKFAST. KEEP OFFICE     VISIT. 06/30/18   Lorretta Harp, MD  ezetimibe (ZETIA) 10 MG tablet TAKE 1 TABLET DAILY. KEEP  OFFICE VISIT 06/30/18   Lorretta Harp, MD  finasteride (PROSCAR) 5 MG tablet Take 5 mg by mouth daily.    [provider]  Fluticasone-Salmeterol (ADVAIR) 100-50 MCG/DOSE AEPB Inhale 1 puff into the lungs 2 (two) times daily. 08/09/17   Colon Branch, MD  furosemide (LASIX) 40 MG tablet TAKE 1 TABLET BY MOUTH EVERY DAY 04/07/18   Lorretta Harp, MD  glucose blood test strip Use to test blood sugars twice daily 02/01/14   Norins, Heinz Knuckles, MD  Grape Seed 100 MG CAPS Take 200 mg by mouth daily.     [provider]  Insulin Glargine (BASAGLAR KWIKPEN) 100 UNIT/ML SOPN Inject 0.17 mLs (17 Units total) into the skin at bedtime. 12/20/17   Colon Branch, MD  Insulin Pen Needle 32G X 4 MM MISC To use w/ Basaglar 11/29/15   Colon Branch, MD  isosorbide mononitrate (IMDUR) 30 MG 24 hr tablet Take 0.5 tablets (15 mg total) by mouth daily. 05/04/16   Lorretta Harp, MD  Lancet Devices (ADJUSTABLE LANCING DEVICE) MISC Reported on 04/30/2016 02/23/14   [provider]  lisinopril (PRINIVIL,ZESTRIL) 2.5 MG tablet TAKE 1 TABLET DAILY. KEEP  OFFICE VISIT. 06/30/18   Lorretta Harp, MD  metFORMIN (GLUCOPHAGE) 850 MG tablet Take 1.5 tablets by mouth twice daily with meal 02/07/18   Colon Branch, MD  metoprolol  tartrate (LOPRESSOR) 25 MG tablet Take 0.5 tablets (12.5 mg total) by mouth 2 (two) times daily. 06/30/18   Lorretta Harp, MD  mometasone (NASONEX) 50 MCG/ACT nasal spray Place 2 sprays into the nose daily. 12/06/17   Colon Branch, MD  nitroGLYCERIN (NITROSTAT) 0.4 MG SL tablet Place 1 tablet (0.4 mg total) under the tongue every 5 (five) minutes x 3 doses as needed for chest pain. Patient not taking: Reported on 04/08/2018 02/04/18   Colon Branch, MD  pantoprazole (PROTONIX) 40 MG tablet TAKE 1 TABLET DAILY. KEEP  OFFICE VISIT. 06/30/18   Lorretta Harp, MD  Propylene Glycol (SYSTANE BALANCE OP) Place 1 drop into both eyes daily as needed (dry eyes).     [provider]  rosuvastatin (CRESTOR) 10 MG tablet TAKE 1 TABLET DAILY. 06/30/18   Lorretta Harp, MD  Allergies:    Allergies  Allergen Reactions  . Ativan [Lorazepam] Other (See Comments)    Made him crazy  . Colesevelam Other (See Comments)    Unknown reaction  . Ezetimibe-Simvastatin Other (See Comments)    Vytorin caused pancreatitis    Social History:   Social History   Socioeconomic History  . Marital status: Married    Spouse name: Not on file  . Number of children: 1  . Years of education: Not on file  . Highest education level: Not on file  Occupational History  . 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 Financial resource strain: Not on file  . Food insecurity:    Worry: Not on file    Inability: Not on file  . Transportation needs:    Medical: Not on file    Non-medical: Not on file  Tobacco Use  . Smoking status: Never Smoker  . Smokeless tobacco: Never Used  . Tobacco comment: 11/02/2013 "smoked cigars when I was 18 or so; didn't smoke many"  Substance and Sexual Activity  . Alcohol use: Not Currently    Alcohol/week: 2.0 standard drinks    Types: 2 Glasses of wine per week  . Drug use: No  . Sexual activity: Not Currently  Lifestyle  . Physical activity:     Days per week: Not on file    Minutes per session: Not on file  . Stress: Not on file  Relationships  . Social connections:    Talks on phone: Not on file    Gets together: Not on file    Attends religious service: Not on file    Active member of club or organization: Not on file    Attends meetings of clubs or organizations: Not on file    Relationship status: Not on file  . Intimate partner violence:    Fear of current or ex partner: Not on file    Emotionally abused: Not on file    Physically abused: Not on file    Forced sexual activity: Not on file  Other Topics Concern  . 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 There is no history of Colon cancer.    ROS:  Please see the history of present illness.  Patient describes cough that is nonproductive.  No fevers, chills, hemoptysis or melena.  All other ROS reviewed and negative.     Physical Exam/Data:   Vitals:   09/06/18 1241 09/06/18 1247 09/06/18 1315 09/06/18 1400  BP: 135/72  109/66 113/71  Pulse: 83  79 80  Resp: (!) '24  19 16  ' Temp: (!) 97.4 F (36.3 C)     TempSrc: Oral     SpO2: 100%  100% 98%  Weight:  75 kg    Height:  '5\' 8"'  (1.727 m)     No intake or output data in the 24 hours ending 09/06/18 1420 Filed Weights   09/06/18 1247  Weight: 75 kg   Body mass index is 25.14 kg/m.  General:  Well nourished, well developed, in no acute distress HEENT: normal Lymph: no adenopathy Neck: supple Endocrine:  No thryomegaly Vascular: No carotid bruits; FA  pulses 2+ bilaterally without bruits  Cardiac:  normal S1, S2; RRR; 2/6 systolic murmur apex Lungs:  clear to auscultation bilaterally, no wheezing, rhonchi or rales  Abd: soft, nontender, no hepatomegaly  Ext:  trace edema Musculoskeletal:  No deformities, BUE and BLE strength normal and equal Skin: warm and dry  Neuro:  CNs 2-12 intact, no focal abnormalities noted Psych:  Normal affect    EKG:  The ECG - sinus rhythm, inferior lateral T wave inversion unchanged compared to previous.  Was personally reviewed  Laboratory Data:  Chemistry Recent Labs  Lab 09/06/18 1250  NA 137  K 4.3  CL 99  CO2 23  GLUCOSE 94  BUN 28*  CREATININE 1.22  CALCIUM 9.1  GFRNONAA 50*  GFRAA 58*  ANIONGAP 15    Hematology Recent Labs  Lab 09/06/18 1250  WBC 10.7*  RBC 4.22  HGB 12.2*  HCT 39.4  MCV 93.4  MCH 28.9  MCHC 31.0  RDW 15.1  PLT 230    Recent Labs  Lab 09/06/18 1301  TROPIPOC 1.63*    BNP Recent Labs  Lab 09/06/18 1250  BNP 2,072.5*    Radiology/Studies:  Dg Chest Portable 1 View  Result Date: 09/06/2018 CLINICAL DATA:  Increasing shortness of breath over 3 days, orthopnea, history CHF, coronary artery disease post MI, asthma, hypertension, type II diabetes mellitus EXAM: PORTABLE CHEST 1 VIEW COMPARISON:  Portable exam 1250 hours compared to 03/05/2018 FINDINGS: Enlargement of cardiac silhouette post CABG. Atherosclerotic calcification aorta. Mediastinal contours and pulmonary vascularity normal. Lungs clear. Probable tiny pleural effusions. No pneumothorax. Bones demineralized. IMPRESSION: Enlargement of cardiac silhouette post CABG. Tiny bibasilar effusions. Electronically Signed   By: Lavonia Dana M.D.   On: 09/06/2018 13:24    Assessment and Plan:   1. Acute on chronic systolic congestive heart failure-patient presents with progressive dyspnea on exertion and BNP is elevated and chest x-ray shows small effusions.  Not markedly volume overloaded on examination.  He was told his sodium was decreased recently and therefore he began eating excess salt at home.  This is likely the culprit.  I will treat with Lasix 40 mg IV twice daily.  Follow renal function.  We discussed the  importance of low-sodium diet and fluid restriction.  I also explained that following discharge he will need to weigh himself daily and take an additional 40 mg of Lasix for weight gain of 2 to 3 pounds. 2. Non-ST elevation myocardial infarction-patient denies chest pain.  We will continue to cycle enzymes.  Possible demand ischemia.  If no further increase would favor stress nuclear study for risk stratification and medical therapy if no ischemia.  If there is a significant increase he will require cardiac catheterization.  We will treat with aspirin, Plavix, statin, data blocker and heparin. 3. Ischemic cardiomyopathy-continue ACE inhibitor.  Change metoprolol to carvedilol 3.125 mg twice daily.  Titrate medications as an outpatient. 4. Coronary artery disease-continue aspirin, Plavix and statin. 5. Hyperlipidemia-continue statin. 6. Diabetes mellitus-continue preadmission medications but hold Glucophage for now.  Follow CBGs.  Severity of Illness: The appropriate patient status for this patient is INPATIENT. Inpatient status is judged to be reasonable and necessary in order to provide the required intensity of service to ensure the patient's safety. The patient's presenting symptoms, physical exam findings, and initial radiographic and laboratory data in the context of their chronic comorbidities is felt to place them at high risk for further clinical deterioration. Furthermore, it is not anticipated that the patient  will be medically stable for discharge from the hospital within 2 midnights of admission. The following factors support the patient status of inpatient.   " The patient's presenting symptoms include dyspnea. " The worrisome physical exam findings include CHF, volume overload, pedal edema. " The initial radiographic and laboratory data are worrisome because of pleural effusions on chest xray and elevated troponin. " The chronic co-morbidities include age 31, DM, ischemic CM.   * I  certify that at the point of admission it is my clinical judgment that the patient will require inpatient hospital care spanning beyond 2 midnights from the point of admission due to high intensity of service, high risk for further deterioration and high frequency of surveillance required.*    For questions or updates, please contact [place] Please consult [email or web address] for contact info under        Signed, Kirk Ruths, MD  09/06/2018 2:20 PM

## 2018-09-06 NOTE — Progress Notes (Signed)
ANTICOAGULATION CONSULT NOTE - Initial Consult  Pharmacy Consult for heparin Indication: chest pain/ACS  Allergies  Allergen Reactions  . Ativan [Lorazepam] Other (See Comments)    Made him crazy  . Colesevelam Other (See Comments)    Unknown reaction  . Ezetimibe-Simvastatin Other (See Comments)    Vytorin caused pancreatitis    Patient Measurements: Height: 5\' 8"  (172.7 cm) Weight: 165 lb 5.5 oz (75 kg) IBW/kg (Calculated) : 68.4 Heparin Dosing Weight: 75kg  Vital Signs: Temp: 97.4 F (36.3 C) (11/23 1241) Temp Source: Oral (11/23 1241) BP: 135/72 (11/23 1241) Pulse Rate: 83 (11/23 1241)  Labs: Recent Labs    09/06/18 1250  HGB 12.2*  HCT 39.4  PLT 230    Estimated Creatinine Clearance: 55.2 mL/min (by C-G formula based on SCr of 0.86 mg/dL).   Medical History: Past Medical History:  Diagnosis Date  . Acute CHF (HCC) 09/15/2012  . Asthma    pt denies this hx on 11/02/2013  . BPH (benign prostatic hyperplasia)   . CAD (coronary artery disease)    CABG '97, SVG DES 12/13  . Colonic polyp   . Exertional shortness of breath   . GERD (gastroesophageal reflux disease)   . Hearing loss   . History of pancreatitis    pt denies this hx on 11/02/2013  . Hyperlipidemia   . Hypertension   . Intrinsic asthma 06/18/2007   Qualifier: Diagnosis of  By: Dance CMA (AAMA), Kim    . Ischemic cardiomyopathy 09/16/2012   EF 25%- improved to 45% 3/14  . Myocardial infarction (HCC) 1997  . Obstructed, uropathy   . PVD (peripheral vascular disease) (HCC)    moderate bilat carotid disease  . Syncope and collapse 11/02/2013   "didn't pass out" (11/02/2013)  . Type II diabetes mellitus (HCC)     Medications:  Scheduled:  . heparin  4,000 Units Intravenous Once    Assessment: 89 YOM presenting with increasing SOB, elevated troponin and pharmacy consulted to start heparin.  On DAPT but no AC PTA, CBC stable.    Goal of Therapy:  Heparin level 0.3-0.7 units/ml Monitor  platelets by anticoagulation protocol: Yes   Plan:  Heparin 4000 unit bolus, and gtt at 900 units/hr F/u 8 hour heparin level Monitor daily heparin level, CBC, s/s bleeding  Daylene Posey, PharmD Clinical Pharmacist Please check AMION for all [facility] numbers 09/06/2018 1:27 PM

## 2018-09-06 NOTE — ED Triage Notes (Signed)
Pt from home via ems; c/o sob w/ exertion increasing over last 3 days; wife usually gives pt lasix at night w/ relief, says its not helping currently; pt sleeps w/ 4 pillows, cannot breathe lying flat; used inhaler today w/o relief; hx dementia, htn, dm, CHF  112/66 HR 80 96% RA RR 24

## 2018-09-07 ENCOUNTER — Other Ambulatory Visit: Payer: Self-pay

## 2018-09-07 LAB — URINE CULTURE: Culture: NO GROWTH

## 2018-09-07 LAB — CBC
HEMATOCRIT: 37 % — AB (ref 39.0–52.0)
Hemoglobin: 12.1 g/dL — ABNORMAL LOW (ref 13.0–17.0)
MCH: 28.9 pg (ref 26.0–34.0)
MCHC: 32.7 g/dL (ref 30.0–36.0)
MCV: 88.3 fL (ref 80.0–100.0)
PLATELETS: 226 10*3/uL (ref 150–400)
RBC: 4.19 MIL/uL — AB (ref 4.22–5.81)
RDW: 14.9 % (ref 11.5–15.5)
WBC: 14.4 10*3/uL — ABNORMAL HIGH (ref 4.0–10.5)
nRBC: 0 % (ref 0.0–0.2)

## 2018-09-07 LAB — GLUCOSE, CAPILLARY
GLUCOSE-CAPILLARY: 114 mg/dL — AB (ref 70–99)
Glucose-Capillary: 75 mg/dL (ref 70–99)
Glucose-Capillary: 99 mg/dL (ref 70–99)
Glucose-Capillary: 82 mg/dL (ref 70–99)
Glucose-Capillary: 171 mg/dL — ABNORMAL HIGH (ref 70–99)

## 2018-09-07 LAB — BASIC METABOLIC PANEL
ANION GAP: 17 — AB (ref 5–15)
BUN: 30 mg/dL — ABNORMAL HIGH (ref 8–23)
CHLORIDE: 96 mmol/L — AB (ref 98–111)
CO2: 23 mmol/L (ref 22–32)
CREATININE: 1.16 mg/dL (ref 0.61–1.24)
Calcium: 9.2 mg/dL (ref 8.9–10.3)
GFR calc non Af Amer: 54 mL/min — ABNORMAL LOW (ref >60)
Glucose, Bld: 84 mg/dL (ref 70–99)
POTASSIUM: 3.5 mmol/L (ref 3.5–5.1)
SODIUM: 136 mmol/L (ref 135–145)

## 2018-09-07 LAB — HEPARIN LEVEL (UNFRACTIONATED): Heparin Unfractionated: 0.47 IU/mL (ref 0.30–0.70)

## 2018-09-07 LAB — TROPONIN I: Troponin I: 1.93 ng/mL (ref <0.03)

## 2018-09-07 MED ORDER — AMIODARONE HCL IN DEXTROSE 360-4.14 MG/200ML-% IV SOLN: 30.0000 mg/h | INTRAVENOUS | Status: DC

## 2018-09-07 MED ORDER — AMIODARONE HCL IN DEXTROSE 360-4.14 MG/200ML-% IV SOLN
60.0000 mg/h | INTRAVENOUS | Status: AC
  Filled 2018-09-07: qty 200

## 2018-09-07 MED ORDER — AMIODARONE HCL IN DEXTROSE 360-4.14 MG/200ML-% IV SOLN
INTRAVENOUS | Status: AC
  Filled 2018-09-07: qty 200

## 2018-09-07 MED ORDER — AMIODARONE IV BOLUS ONLY 150 MG/100ML
150.0000 mg | Freq: Once | INTRAVENOUS | Status: AC
  Administered 2018-09-07: 150 mg via INTRAVENOUS

## 2018-09-07 MED ORDER — CARVEDILOL 3.125 MG PO TABS
6.2500 mg | ORAL_TABLET | Freq: Two times a day (BID) | ORAL | Status: DC
  Administered 2018-09-07 – 2018-09-10 (×6): 6.25 mg via ORAL
  Filled 2018-09-07: qty 2
  Filled 2018-09-07 (×2): qty 1
  Filled 2018-09-07 (×4): qty 2

## 2018-09-07 NOTE — Progress Notes (Signed)
47 RN observed patient in tachy arrhythmia on monitor, also notified by telemetry. RN checked patient, patient was asymptomatic with all other VSS. RN paged on-call MD with request to come check on patient. EKG performed. MD arrived to assess the patient, ordered amiodarone bolus and infusion.  1846 patient maintains arrhythmia with HR 130-140s. Continues to be asymptomatic. Currently receiving bolus of amio. Will monitor closely.

## 2018-09-07 NOTE — Progress Notes (Signed)
1901 pt converted back to NSR on amiodarone drip. VSS.

## 2018-09-07 NOTE — Plan of Care (Signed)
?

## 2018-09-07 NOTE — Progress Notes (Signed)
ANTICOAGULATION CONSULT NOTE   Pharmacy Consult for Heparin Indication: chest pain/ACS  Allergies  Allergen Reactions  . Ativan [Lorazepam] Other (See Comments)    "Made him crazy"  . Colesevelam Other (See Comments)    Pancreatitis (??)  . Ezetimibe-Simvastatin Other (See Comments)    Vytorin caused pancreatitis    Patient Measurements: Height: 5\' 6"  (167.6 cm) Weight: 160 lb 0.9 oz (72.6 kg) IBW/kg (Calculated) : 63.8 Heparin Dosing Weight: 75kg  Vital Signs: Temp: 97 F (36.1 C) (11/24 0808) Temp Source: Axillary (11/24 0808) BP: 130/92 (11/24 0808) Pulse Rate: 75 (11/24 0808)  Labs: Recent Labs    09/06/18 1250 09/06/18 1700 09/06/18 2241 09/07/18 0404  HGB 12.2*  --   --  12.1*  HCT 39.4  --   --  37.0*  PLT 230  --   --  226  HEPARINUNFRC  --   --  0.64 0.47  CREATININE 1.22  --   --  1.16  TROPONINI  --  2.37* 2.33* 1.93*    Estimated Creatinine Clearance: 38.2 mL/min (by C-G formula based on SCr of 1.16 mg/dL).   Medical History: Past Medical History:  Diagnosis Date  . Acute CHF (HCC) 09/15/2012  . Asthma    pt denies this hx on 11/02/2013  . BPH (benign prostatic hyperplasia)   . CAD (coronary artery disease)    CABG '97, SVG DES 12/13  . Colonic polyp   . Exertional shortness of breath   . GERD (gastroesophageal reflux disease)   . Hearing loss   . History of pancreatitis    pt denies this hx on 11/02/2013  . Hyperlipidemia   . Hypertension   . Intrinsic asthma 06/18/2007   Qualifier: Diagnosis of  By: Dance CMA (AAMA), Kim    . Ischemic cardiomyopathy 09/16/2012   EF 25%- improved to 45% 3/14  . Myocardial infarction (HCC) 1997  . Obstructed, uropathy   . PVD (peripheral vascular disease) (HCC)    moderate bilat carotid disease  . Syncope and collapse 11/02/2013   "didn't pass out" (11/02/2013)  . Type II diabetes mellitus (HCC)    Assessment: 89 YOM presenting with increasing SOB, elevated troponin and pharmacy consulted to start  heparin.  On DAPT but no AC PTA, CBC stable.    Heparin level continues to be at goal this morning at 0.47. CBC stable, no bleeding issues noted.   Goal of Therapy:  Heparin level 0.3-0.7 units/ml Monitor platelets by anticoagulation protocol: Yes   Plan:  Cont heparin at 900 units/hr Monitor daily heparin level, CBC, s/s bleeding  Sheppard Coil PharmD., BCPS Clinical Pharmacist 09/07/2018 8:55 AM

## 2018-09-07 NOTE — Progress Notes (Signed)
Progress Note  Patient Name: [name]. Date of Encounter: 09/07/2018  Primary Cardiologist: Dr Allyson Sabal  Subjective   No chest pain or dyspnea  Inpatient Medications    Scheduled Meds: . aspirin EC  81 mg Oral Daily  . carvedilol  3.125 mg Oral BID WC  . clopidogrel  75 mg Oral Daily  . ezetimibe  10 mg Oral Daily  . finasteride  5 mg Oral Daily  . furosemide  40 mg Intravenous BID  . insulin aspart  0-15 Units Subcutaneous TID WC  . isosorbide mononitrate  15 mg Oral Daily  . lisinopril  2.5 mg Oral Daily  . mometasone-formoterol  2 puff Inhalation BID  . pantoprazole  40 mg Oral QAC breakfast  . rosuvastatin  10 mg Oral Daily   Continuous Infusions: . heparin 900 Units/hr (09/07/18 0400)   PRN Meds: acetaminophen, albuterol, nitroGLYCERIN, ondansetron (ZOFRAN) IV   Vital Signs    Vitals:   09/07/18 0400 09/07/18 0432 09/07/18 0803 09/07/18 0808  BP:    (!) 130/92  Pulse:    75  Resp: (!) 25   (!) 23  Temp:    (!) 97 F (36.1 C)  TempSrc:    Axillary  SpO2:   98% 97%  Weight:  72.6 kg    Height:        Intake/Output Summary (Last 24 hours) at 09/07/2018 0937 Last data filed at 09/07/2018 0900 Gross per 24 hour  Intake 643.6 ml  Output 1600 ml  Net -956.4 ml   Filed Weights   09/06/18 1247 09/06/18 1600 09/07/18 0432  Weight: 75 kg 75 kg 72.6 kg    Telemetry    Sinus with PVCs and NSVT - Personally Reviewed  Physical Exam   GEN: No acute distress.   Neck: No JVD Cardiac: RRR, no murmurs, rubs, or gallops.  Respiratory: Clear to auscultation bilaterally. GI: Soft, nontender, non-distended  MS: No edema Neuro:  Nonfocal  Psych: Normal affect   Labs    Chemistry Recent Labs  Lab 09/06/18 1250 09/07/18 0404  NA 137 136  K 4.3 3.5  CL 99 96*  CO2 23 23  GLUCOSE 94 84  BUN 28* 30*  CREATININE 1.22 1.16  CALCIUM 9.1 9.2  GFRNONAA 50* 54*  GFRAA 58* >60  ANIONGAP 15 17*     Hematology Recent Labs  Lab 09/06/18 1250  09/07/18 0404  WBC 10.7* 14.4*  RBC 4.22 4.19*  HGB 12.2* 12.1*  HCT 39.4 37.0*  MCV 93.4 88.3  MCH 28.9 28.9  MCHC 31.0 32.7  RDW 15.1 14.9  PLT 230 226    Cardiac Enzymes Recent Labs  Lab 09/06/18 1700 09/06/18 2241 09/07/18 0404  TROPONINI 2.37* 2.33* 1.93*    Recent Labs  Lab 09/06/18 1301  TROPIPOC 1.63*     BNP Recent Labs  Lab 09/06/18 1250  BNP 2,072.5*      Radiology    Dg Chest Portable 1 View  Result Date: 09/06/2018 CLINICAL DATA:  Increasing shortness of breath over 3 days, orthopnea, history CHF, coronary artery disease post MI, asthma, hypertension, type II diabetes mellitus EXAM: PORTABLE CHEST 1 VIEW COMPARISON:  Portable exam 1250 hours compared to 03/05/2018 FINDINGS: Enlargement of cardiac silhouette post CABG. Atherosclerotic calcification aorta. Mediastinal contours and pulmonary vascularity normal. Lungs clear. Probable tiny pleural effusions. No pneumothorax. Bones demineralized. IMPRESSION: Enlargement of cardiac silhouette post CABG. Tiny bibasilar effusions. Electronically Signed   By: Ulyses Southward M.D.   On:  09/06/2018 13:24    Patient Profile     [name]. is a 84 y.o. male with past medical history of hypertension, diabetes mellitus, hyperlipidemia coronary artery disease status post coronary artery bypass and graft in 1997 admitted with acute on chronic systolic congestive heart failure, non-ST elevation myocardial infarction.  Last cardiac catheterization December 2013 showed severe three-vessel coronary disease, patent LIMA to the LAD, patent saphenous vein graft to the diagonal, patent saphenous vein graft to the obtuse marginal and saphenous vein graft to the PDA had a 99% proximal lesion.  Patient had PCI of the vein graft to the PDA.  Nuclear study August 2017 showed ejection fraction 41%, prior infarct but no ischemia. Last echocardiogram May 2019 showed ejection fraction 25 to 30%, moderate diastolic dysfunction, mild aortic  insufficiency, mild mitral regurgitation, severe biatrial enlargement, mild right ventricular enlargement and moderate pulmonary hypertension.  Assessment & Plan    1 Non-ST elevation myocardial infarction-did not have chest pain at time of admission.  Troponin is mildly elevated.  I discussed options today including cardiac catheterization versus nuclear study for risk stratification.  We have elected to proceed with cardiac catheterization tomorrow.  The risks and benefits including myocardial infarction, CVA and death discussed and he agrees to proceed.  Will hold Lasix prior to procedure.  Would not perform ventriculogram.  Continue aspirin, Plavix, statin, beta-blocker and heparin.  2 acute on chronic systolic congestive heart failure-patient symptoms have improved with diuresis.  He is confused about sodium and fluid intake.  I again reviewed fluid restriction and low-sodium diet.  I will hold Lasix in anticipation of cardiac catheterization tomorrow.  Will resume after procedure when renal function stable.  Would also add spironolactone.  3 ischemic cardiomyopathy-hold lisinopril prior to catheterization and resume after when renal function stable.  Increase carvedilol to 6.25 mg twice daily.  4 nonsustained ventricular tachycardia-noted on telemetry.  Increase carvedilol to 6.25 mg twice daily.  5 coronary artery disease-continue aspirin, Plavix and statin.  6 hyperkalemia-continue statin.  For questions or updates, please contact [facility] Please consult [email or web address] for contact info under        Signed, Olga Millers, MD  09/07/2018, 9:37 AM

## 2018-09-08 ENCOUNTER — Inpatient Hospital Stay (HOSPITAL_COMMUNITY)
Admission: EM | Disposition: A | Discharge status: Skilled Nursing Facility | Payer: Self-pay | Source: Home / Self Care | Attending: Cardiology

## 2018-09-08 ENCOUNTER — Other Ambulatory Visit: Payer: Self-pay

## 2018-09-08 DIAGNOSIS — I25810 Atherosclerosis of coronary artery bypass graft(s) without angina pectoris: Secondary | ICD-10-CM

## 2018-09-08 DIAGNOSIS — E876 Hypokalemia: Secondary | ICD-10-CM

## 2018-09-08 HISTORY — PX: LEFT HEART CATH AND CORS/GRAFTS ANGIOGRAPHY: CATH118250

## 2018-09-08 HISTORY — PX: CORONARY STENT INTERVENTION: CATH118234

## 2018-09-08 LAB — BASIC METABOLIC PANEL
Anion gap: 10 (ref 5–15)
BUN: 25 mg/dL — ABNORMAL HIGH (ref 8–23)
CHLORIDE: 95 mmol/L — AB (ref 98–111)
CO2: 29 mmol/L (ref 22–32)
Calcium: 8.4 mg/dL — ABNORMAL LOW (ref 8.9–10.3)
Creatinine, Ser: 1.06 mg/dL (ref 0.61–1.24)
Glucose, Bld: 134 mg/dL — ABNORMAL HIGH (ref 70–99)
Potassium: 3.1 mmol/L — ABNORMAL LOW (ref 3.5–5.1)
SODIUM: 134 mmol/L — AB (ref 135–145)

## 2018-09-08 LAB — GLUCOSE, CAPILLARY
GLUCOSE-CAPILLARY: 123 mg/dL — AB (ref 70–99)
GLUCOSE-CAPILLARY: 101 mg/dL — AB (ref 70–99)
GLUCOSE-CAPILLARY: 126 mg/dL — AB (ref 70–99)
GLUCOSE-CAPILLARY: 98 mg/dL (ref 70–99)

## 2018-09-08 LAB — CBC
HCT: 33.3 % — ABNORMAL LOW (ref 39.0–52.0)
HEMOGLOBIN: 11.2 g/dL — AB (ref 13.0–17.0)
MCH: 29.6 pg (ref 26.0–34.0)
MCHC: 33.6 g/dL (ref 30.0–36.0)
MCV: 87.9 fL (ref 80.0–100.0)
NRBC: 0 % (ref 0.0–0.2)
Platelets: 193 10*3/uL (ref 150–400)
RBC: 3.79 MIL/uL — AB (ref 4.22–5.81)
RDW: 14.4 % (ref 11.5–15.5)
WBC: 9.6 10*3/uL (ref 4.0–10.5)

## 2018-09-08 LAB — HEPARIN LEVEL (UNFRACTIONATED): HEPARIN UNFRACTIONATED: 0.5 [IU]/mL (ref 0.30–0.70)

## 2018-09-08 SURGERY — LEFT HEART CATH AND CORS/GRAFTS ANGIOGRAPHY
Anesthesia: LOCAL

## 2018-09-08 MED ORDER — SODIUM CHLORIDE 0.9 % IV SOLN
INTRAVENOUS | Status: AC
  Administered 2018-09-08: 18:00:00 via INTRAVENOUS

## 2018-09-08 MED ORDER — ASPIRIN 81 MG PO CHEW: 81.0000 mg | CHEWABLE_TABLET | ORAL | Status: DC

## 2018-09-08 MED ORDER — SODIUM CHLORIDE 0.9% FLUSH
3.0000 mL | Freq: Two times a day (BID) | INTRAVENOUS | Status: DC
  Administered 2018-09-09 – 2018-09-10 (×3): 3 mL via INTRAVENOUS

## 2018-09-08 MED ORDER — BIVALIRUDIN TRIFLUOROACETATE 250 MG IV SOLR
INTRAVENOUS | Status: AC
  Filled 2018-09-08: qty 250

## 2018-09-08 MED ORDER — IOHEXOL 350 MG/ML SOLN
INTRAVENOUS | Status: DC | PRN
  Administered 2018-09-08: 125 mL

## 2018-09-08 MED ORDER — LABETALOL HCL 5 MG/ML IV SOLN: 10.0000 mg | INTRAVENOUS | Status: AC | PRN

## 2018-09-08 MED ORDER — MELATONIN 3 MG PO TABS
3.0000 mg | ORAL_TABLET | Freq: Every evening | ORAL | Status: DC | PRN
  Administered 2018-09-09: 3 mg via ORAL
  Filled 2018-09-08 (×3): qty 1

## 2018-09-08 MED ORDER — SODIUM CHLORIDE 0.9 % IV SOLN
INTRAVENOUS | Status: AC | PRN
  Administered 2018-09-08: 1.75 mg/kg/h via INTRAVENOUS
  Administered 2018-09-08: 16:00:00

## 2018-09-08 MED ORDER — LIDOCAINE HCL (PF) 1 % IJ SOLN
INTRAMUSCULAR | Status: AC
  Filled 2018-09-08: qty 30

## 2018-09-08 MED ORDER — SODIUM CHLORIDE 0.9% FLUSH: 3.0000 mL | Freq: Two times a day (BID) | INTRAVENOUS | Status: DC

## 2018-09-08 MED ORDER — VERAPAMIL HCL 2.5 MG/ML IV SOLN
INTRAVENOUS | Status: AC
  Filled 2018-09-08: qty 2

## 2018-09-08 MED ORDER — LIDOCAINE HCL (PF) 1 % IJ SOLN
INTRAMUSCULAR | Status: DC | PRN
  Administered 2018-09-08: 18 mL

## 2018-09-08 MED ORDER — SODIUM CHLORIDE 0.9 % IV SOLN: 250.0000 mL | INTRAVENOUS | Status: DC | PRN

## 2018-09-08 MED ORDER — ONDANSETRON HCL 4 MG/2ML IJ SOLN: 4.0000 mg | Freq: Four times a day (QID) | INTRAMUSCULAR | Status: DC | PRN

## 2018-09-08 MED ORDER — SODIUM CHLORIDE 0.9% FLUSH: 3.0000 mL | INTRAVENOUS | Status: DC | PRN

## 2018-09-08 MED ORDER — MORPHINE SULFATE (PF) 2 MG/ML IV SOLN: 2.0000 mg | INTRAVENOUS | Status: DC | PRN

## 2018-09-08 MED ORDER — CLOPIDOGREL BISULFATE 300 MG PO TABS
ORAL_TABLET | ORAL | Status: DC | PRN
  Administered 2018-09-08: 600 mg via ORAL

## 2018-09-08 MED ORDER — CLOPIDOGREL BISULFATE 75 MG PO TABS
75.0000 mg | ORAL_TABLET | Freq: Every day | ORAL | Status: DC
  Administered 2018-09-09 – 2018-09-10 (×2): 75 mg via ORAL
  Filled 2018-09-08 (×2): qty 1

## 2018-09-08 MED ORDER — NITROGLYCERIN 1 MG/10 ML FOR IR/CATH LAB
INTRA_ARTERIAL | Status: AC
  Filled 2018-09-08: qty 10

## 2018-09-08 MED ORDER — ASPIRIN 81 MG PO CHEW
81.0000 mg | CHEWABLE_TABLET | ORAL | Status: AC
  Administered 2018-09-08: 81 mg via ORAL
  Filled 2018-09-08: qty 1

## 2018-09-08 MED ORDER — HYDRALAZINE HCL 20 MG/ML IJ SOLN: 5.0000 mg | INTRAMUSCULAR | Status: AC | PRN

## 2018-09-08 MED ORDER — ACETAMINOPHEN 325 MG PO TABS: 650.0000 mg | ORAL_TABLET | ORAL | Status: DC | PRN

## 2018-09-08 MED ORDER — SODIUM CHLORIDE 0.9 % IV SOLN
1.7500 mg/kg/h | INTRAVENOUS | Status: AC
  Administered 2018-09-08: 18:00:00 1.75 mg/kg/h via INTRAVENOUS
  Filled 2018-09-08 (×2): qty 250

## 2018-09-08 MED ORDER — HEART ATTACK BOUNCING BOOK
Freq: Once | Status: AC
  Administered 2018-09-09: 01:00:00
  Filled 2018-09-08: qty 1

## 2018-09-08 MED ORDER — CLOPIDOGREL BISULFATE 300 MG PO TABS
ORAL_TABLET | ORAL | Status: AC
  Filled 2018-09-08: qty 2

## 2018-09-08 MED ORDER — ASPIRIN 81 MG PO CHEW
81.0000 mg | CHEWABLE_TABLET | Freq: Every day | ORAL | Status: DC
  Administered 2018-09-09 – 2018-09-10 (×2): 81 mg via ORAL
  Filled 2018-09-08 (×2): qty 1

## 2018-09-08 MED ORDER — ANGIOPLASTY BOOK
Freq: Once | Status: AC
  Administered 2018-09-09: 01:00:00
  Filled 2018-09-08: qty 1

## 2018-09-08 MED ORDER — POTASSIUM CHLORIDE CRYS ER 20 MEQ PO TBCR
60.0000 meq | EXTENDED_RELEASE_TABLET | Freq: Once | ORAL | Status: AC
  Administered 2018-09-08: 60 meq via ORAL
  Filled 2018-09-08: qty 3

## 2018-09-08 MED ORDER — HEPARIN (PORCINE) IN NACL 1000-0.9 UT/500ML-% IV SOLN
INTRAVENOUS | Status: DC | PRN
  Administered 2018-09-08 (×2): 500 mL

## 2018-09-08 MED ORDER — SODIUM CHLORIDE 0.9 % IV SOLN
INTRAVENOUS | Status: DC
  Administered 2018-09-08: 06:00:00 via INTRAVENOUS

## 2018-09-08 MED ORDER — HEPARIN (PORCINE) IN NACL 1000-0.9 UT/500ML-% IV SOLN
INTRAVENOUS | Status: AC
  Filled 2018-09-08: qty 500

## 2018-09-08 MED ORDER — BIVALIRUDIN BOLUS VIA INFUSION - CUPID
INTRAVENOUS | Status: DC | PRN
  Administered 2018-09-08: 53.025 mg via INTRAVENOUS

## 2018-09-08 MED ORDER — SODIUM CHLORIDE 0.9% FLUSH
3.0000 mL | INTRAVENOUS | Status: DC | PRN
  Administered 2018-09-08: 3 mL via INTRAVENOUS
  Filled 2018-09-08: qty 3

## 2018-09-08 MED ORDER — SODIUM CHLORIDE 0.9 % IV SOLN: INTRAVENOUS | Status: DC

## 2018-09-08 MED ORDER — POTASSIUM CHLORIDE CRYS ER 20 MEQ PO TBCR
30.0000 meq | EXTENDED_RELEASE_TABLET | Freq: Once | ORAL | Status: AC
  Administered 2018-09-08: 30 meq via ORAL
  Filled 2018-09-08: qty 1

## 2018-09-08 SURGICAL SUPPLY — 19 items
BALLN SAPPHIRE 2.0X12 (BALLOONS) ×2
BALLN SAPPHIRE ~~LOC~~ 3.25X15 (BALLOONS) ×1 IMPLANT
BALLOON SAPPHIRE 2.0X12 (BALLOONS) IMPLANT
CATH INFINITI 5 FR RCB (CATHETERS) ×1 IMPLANT
CATH INFINITI 5FR MULTPACK ANG (CATHETERS) ×1 IMPLANT
CATH VISTA GUIDE 6FR RCB 90 CM (CATHETERS) ×1 IMPLANT
DEVICE SPIDERFX EMB PROT 4MM (WIRE) ×1 IMPLANT
ELECT DEFIB PAD ADLT CADENCE (PAD) ×1 IMPLANT
KIT ENCORE 26 ADVANTAGE (KITS) ×1 IMPLANT
KIT HEART LEFT (KITS) ×2 IMPLANT
PACK CARDIAC CATHETERIZATION (CUSTOM PROCEDURE TRAY) ×2 IMPLANT
SHEATH PINNACLE 5F 10CM (SHEATH) ×1 IMPLANT
SHEATH PINNACLE 6F 10CM (SHEATH) ×1 IMPLANT
STENT SYNERGY DES 3X12 (Permanent Stent) ×1 IMPLANT
STENT SYNERGY DES 3X24 (Permanent Stent) ×1 IMPLANT
TRANSDUCER W/STOPCOCK (MISCELLANEOUS) ×2 IMPLANT
TUBING CIL FLEX 10 FLL-RA (TUBING) ×2 IMPLANT
WIRE ASAHI PROWATER 180CM (WIRE) ×1 IMPLANT
WIRE EMERALD 3MM-J .035X150CM (WIRE) ×1 IMPLANT

## 2018-09-08 NOTE — Progress Notes (Signed)
ANTICOAGULATION CONSULT NOTE   Pharmacy Consult for Heparin Indication: chest pain/ACS  Allergies  Allergen Reactions  . Ativan [Lorazepam] Other (See Comments)    "Made him crazy"  . Colesevelam Other (See Comments)    Pancreatitis (??)  . Ezetimibe-Simvastatin Other (See Comments)    Vytorin caused pancreatitis    Patient Measurements: Height: 5\' 6"  (167.6 cm) Weight: 155 lb 12.8 oz (70.7 kg) IBW/kg (Calculated) : 63.8 Heparin Dosing Weight: 75kg  Vital Signs: Temp: 97.8 F (36.6 C) (11/25 0829) Temp Source: Axillary (11/25 0829) BP: 120/61 (11/25 0829) Pulse Rate: 68 (11/25 0829)  Labs: Recent Labs    09/06/18 1250 09/06/18 1700 09/06/18 2241 09/07/18 0404 09/08/18 0638  HGB 12.2*  --   --  12.1* 11.2*  HCT 39.4  --   --  37.0* 33.3*  PLT 230  --   --  226 193  HEPARINUNFRC  --   --  0.64 0.47 0.50  CREATININE 1.22  --   --  1.16 1.06  TROPONINI  --  2.37* 2.33* 1.93*  --     Estimated Creatinine Clearance: 41.8 mL/min (by C-G formula based on SCr of 1.06 mg/dL).   Medical History: Past Medical History:  Diagnosis Date  . Acute CHF (HCC) 09/15/2012  . Asthma    pt denies this hx on 11/02/2013  . BPH (benign prostatic hyperplasia)   . CAD (coronary artery disease)    CABG '97, SVG DES 12/13  . Colonic polyp   . Exertional shortness of breath   . GERD (gastroesophageal reflux disease)   . Hearing loss   . History of pancreatitis    pt denies this hx on 11/02/2013  . Hyperlipidemia   . Hypertension   . Intrinsic asthma 06/18/2007   Qualifier: Diagnosis of  By: Dance CMA (AAMA), Kim    . Ischemic cardiomyopathy 09/16/2012   EF 25%- improved to 45% 3/14  . Myocardial infarction (HCC) 1997  . Obstructed, uropathy   . PVD (peripheral vascular disease) (HCC)    moderate bilat carotid disease  . Syncope and collapse 11/02/2013   "didn't pass out" (11/02/2013)  . Type II diabetes mellitus (HCC)    Assessment: [name] presenting with increasing SOB,  elevated troponin and pharmacy consulted to start heparin.  On DAPT but no AC PTA, CBC stable.    Heparin level continues to be at goal this morning at 0.5. Hgb trending down to 11, no bleeding issues noted.   Goal of Therapy:  Heparin level 0.3-0.7 units/ml Monitor platelets by anticoagulation protocol: Yes   Plan:  Cont heparin at 900 units/hr Monitor daily heparin level, CBC, s/s bleeding  Sheppard Coil PharmD., BCPS Clinical Pharmacist 09/08/2018 10:30 AM

## 2018-09-08 NOTE — Consult Note (Addendum)
[facility] CM Primary Care Navigator  09/08/2018  Kimani Hovis. 11-02-1925 [phone]   Martin Majestic to see patient at the bedside to identify possible discharge needs butheis off the unit in the cath lab for a procedure at this time. (left cardiac catheterization)  Will attempt to see patient at another time whenheis available in the room.   Addendum (09/10/18):  Went back to see patient in the room but wife states that he is in the shower at the moment and would rather for me to come back at a later time.  Will try again to see patient at another time whenavailable in the room.   Addendum:  Went back to seepatientat the bedside to identify possible discharge needs after his shower and met with wife Information systems manager) and other family members as well.  Wife reports that patient had "shortness of breath with activity" that had led to this admission. (non-ST elevated myocardial infarction status post left cardiac cath, acute on chronic combined congestive HF, urinary retention)  Patient's wife endorses Dr.Jose Paz with [facility] at [employer] as the primary care provider.   [facility] [place] obtain medications without difficulty.  Patientstatesthat wife is managinghis medications at [employer] use of "pillbox" system filled weekly.  Patient reports thatwife has been driving and providing transportation to his doctors' appointments.   Patient verbalized thatwife is his primary caregiver at home.  Anticipated discharge plan is skilled nursing facility(SNF- in process, prefers [facility])for rehabilitation pertherapy recommendation.  Patientand wife voiced understanding to call primary care provider's officewhenhe will returnhome,for a post discharge follow-up visitwithin1- 2weeksor sooner if needed.Patient letter (with PCP's contact number) was provided astheirreminder.  Explained  topatient and family members about[facility] CM services available for health management andresourcesat homeand had indicatedinterest about it.Patient's wifereports that shehasbeen aware and knowledgeable of ways managing his health conditions so far. Patientand wife expressedunderstandingto seekreferral from primary care provider to [facility] care management ifdeemed necessary and appropriatefor any servicesin the near future- once patient gets back home.  [facility] care management information was provided for future needs that patientmay have.  Primary care provider's office is listed as providing transition of care (TOC) follow-up.    For additional questions please contact:  Edwena Felty A. Renu Asby, BSN, RN-BC [facility] PRIMARY CARE Navigator Cell: [phone]

## 2018-09-08 NOTE — Interval H&P Note (Signed)
Cath Lab Visit (complete for each Cath Lab visit)  Clinical Evaluation Leading to the Procedure:   ACS: Yes.    Non-ACS:    Anginal Classification: CCS II  Anti-ischemic medical therapy: Minimal Therapy (1 class of medications)  Non-Invasive Test Results: No non-invasive testing performed  Prior CABG: Previous CABG      History and Physical Interval Note:  09/08/2018 2:28 PM  [name].  has presented today for surgery, with the diagnosis of NSTEMI  The various methods of treatment have been discussed with the patient and family. After consideration of risks, benefits and other options for treatment, the patient has consented to  Procedure(s): LEFT HEART CATH AND CORS/GRAFTS ANGIOGRAPHY (N/A) as a surgical intervention .  The patient's history has been reviewed, patient examined, no change in status, stable for surgery.  I have reviewed the patient's chart and labs.  Questions were answered to the patient's satisfaction.     [name]

## 2018-09-08 NOTE — Progress Notes (Addendum)
Progress Note  Patient Name: [name]. Date of Encounter: 09/08/2018  Primary Cardiologist: [name], MD   Subjective   Patient sleeping comfortably upon entering the room. Family at bedside reports significant improvement in breathing since admission. Family very concerned about bedrest following catheterization if femoral access used. Questions answered and message sent to Dr. Allyson Sabal who will be performing his catheterization today.   Family noted an episode of elevated HR overnight. The patient denied feeling his heart racing, chest pain, or SOB at that time.   Inpatient Medications    Scheduled Meds: . aspirin EC  81 mg Oral Daily  . carvedilol  6.25 mg Oral BID WC  . clopidogrel  75 mg Oral Daily  . ezetimibe  10 mg Oral Daily  . finasteride  5 mg Oral Daily  . insulin aspart  0-15 Units Subcutaneous TID WC  . isosorbide mononitrate  15 mg Oral Daily  . lisinopril  2.5 mg Oral Daily  . mometasone-formoterol  2 puff Inhalation BID  . pantoprazole  40 mg Oral QAC breakfast  . potassium chloride  30 mEq Oral Once  . rosuvastatin  10 mg Oral Daily  . sodium chloride flush  3 mL Intravenous Q12H   Continuous Infusions: . sodium chloride    . sodium chloride 10 mL/hr at 09/08/18 0609  . heparin 900 Units/hr (09/08/18 0500)   PRN Meds: sodium chloride, acetaminophen, albuterol, nitroGLYCERIN, ondansetron (ZOFRAN) IV, sodium chloride flush   Vital Signs    Vitals:   09/08/18 0500 09/08/18 0600 09/08/18 0829 09/08/18 0911  BP: 111/60 (!) 116/54 120/61   Pulse:   68   Resp:  17 17   Temp: 97.7 F (36.5 C)  97.8 F (36.6 C)   TempSrc: Oral  Axillary   SpO2:   98% 99%  Weight:  70.7 kg    Height:        Intake/Output Summary (Last 24 hours) at 09/08/2018 1125 Last data filed at 09/08/2018 0500 Gross per 24 hour  Intake 619.97 ml  Output 1300 ml  Net -680.03 ml   Filed Weights   09/06/18 1600 09/07/18 0432 09/08/18 0600  Weight: 75 kg 72.6 kg  70.7 kg    Telemetry    Episode of VT lasting minutes, followed by sinus tachycardia with PVCs and return to sinus rhythm - Personally Reviewed  Physical Exam   GEN: Laying in bed in no acute distress.   Neck: No JVD, no carotid bruits Cardiac: RRR, no murmurs, rubs, or gallops.  Respiratory: Clear to auscultation bilaterally, no wheezes/ rales/ rhonchi GI: NABS, Soft, nontender, non-distended  MS: No edema; No deformity. Neuro:  Nonfocal, moving all extremities spontaneously Psych: Normal affect   Labs    Chemistry Recent Labs  Lab 09/06/18 1250 09/07/18 0404 09/08/18 0638  NA 137 136 134*  K 4.3 3.5 3.1*  CL 99 96* 95*  CO2 23 23 29   GLUCOSE 94 84 134*  BUN 28* 30* 25*  CREATININE 1.22 1.16 1.06  CALCIUM 9.1 9.2 8.4*  GFRNONAA 50* 54* NOT CALCULATED  GFRAA 58* >60 NOT CALCULATED  ANIONGAP 15 17* 10     Hematology Recent Labs  Lab 09/06/18 1250 09/07/18 0404 09/08/18 0638  WBC 10.7* 14.4* 9.6  RBC 4.22 4.19* 3.79*  HGB 12.2* 12.1* 11.2*  HCT 39.4 37.0* 33.3*  MCV 93.4 88.3 87.9  MCH 28.9 28.9 29.6  MCHC 31.0 32.7 33.6  RDW 15.1 14.9 14.4  PLT 230 226  193    Cardiac Enzymes Recent Labs  Lab 09/06/18 1700 09/06/18 2241 09/07/18 0404  TROPONINI 2.37* 2.33* 1.93*    Recent Labs  Lab 09/06/18 1301  TROPIPOC 1.63*     BNP Recent Labs  Lab 09/06/18 1250  BNP 2,072.5*     DDimer No results for input(s): DDIMER in the last 168 hours.   Radiology    Dg Chest Portable 1 View  Result Date: 09/06/2018 CLINICAL DATA:  Increasing shortness of breath over 3 days, orthopnea, history CHF, coronary artery disease post MI, asthma, hypertension, type II diabetes mellitus EXAM: PORTABLE CHEST 1 VIEW COMPARISON:  Portable exam 1250 hours compared to 03/05/2018 FINDINGS: Enlargement of cardiac silhouette post CABG. Atherosclerotic calcification aorta. Mediastinal contours and pulmonary vascularity normal. Lungs clear. Probable tiny pleural effusions. No  pneumothorax. Bones demineralized. IMPRESSION: Enlargement of cardiac silhouette post CABG. Tiny bibasilar effusions. Electronically Signed   By: Ulyses Southward M.D.   On: 09/06/2018 13:24    Cardiac Studies   Cardiac catheterization 2013: ANGIOGRAPHIC RESULTS:   1. Left main; normal  2. LAD; occluded proximally 3. Left circumflex; dominant with a 50-70% ostial stenosis, 99% mid AV groove stenosis AV groove stenosis and long 60-70% segmental stenosis distally prior to the takeoff of the PDA. The first obtuse marginal branch was occluded proximally.  4. Right coronary artery; nondominant with a 95% mid stenosis 5.LIMA TO LAD; widely patent 6. SVG TO diagonal branch was widely patent     SVG TO first obtuse marginal branch was widely patent     SVG TO PDA off the dominant circumflex had a 99% focal "apple core" lesion in the proximal portion 7. Left ventriculography; RAO left ventriculogram was performed using  25 mL of Visipaque dye at 12 mL/second. The overall LVEF estimated  20-25 %Without wall motion abnormalities  IMPRESSION: Mr. Gaxiola has severe LV dysfunction with patent grafts and a high-grade proximal SVG stenosis to the dominant circumflex PDA. Will proceed with PCI and stenting using Angiomax and spider distal protection.  Echocardiogram 02/2018: Study Conclusions  - Procedure narrative: Transthoracic echocardiography. Image   quality was poor. Intravenous contrast (Definity) was   administered. - Left ventricle: Systolic function was severely reduced. The   estimated ejection fraction was in the range of 25% to 30%.   Diffuse hypokinesis. Features are consistent with a pseudonormal   left ventricular filling pattern, with concomitant abnormal   relaxation and increased filling pressure (grade 2 diastolic   dysfunction). - Aortic valve: Moderately thickened, moderately calcified   leaflets. Valve mobility was restricted. There was mild   regurgitation. - Aorta: Aortic  root dimension: 39 mm (ED). - Ascending aorta: The ascending aorta was mildly dilated. - Mitral valve: Moderately calcified annulus. Mildly thickened,   moderately calcified leaflets . There was mild regurgitation. - Left atrium: The atrium was severely dilated. - Right ventricle: The cavity size was mildly dilated. Wall   thickness was normal. - Right atrium: The atrium was severely dilated. - Pulmonary arteries: Systolic pressure was moderately increased.   PA peak pressure: 54 mm Hg (S).   Patient Profile     85 y.o.malewith past medical history ofhypertension, diabetes mellitus, hyperlipidemiacoronary artery disease status post coronary artery bypass and graft in 1997 admitted with acute on chronic systolic congestive heart failure, non-ST elevation myocardial infarction.  Assessment & Plan    1. NSTEMI in patient with CAD s/p CABG in 1997: found to have troponin elevated to 2.37 and trended down. No complaints  of chest pain though did present with SOB in the setting of acute on chronic combined CHF. Decision made to pursue LHC to further evaluate coronary anatomy. - Plan for LHC today - Continue aspirin, plavix, and statin  2. Acute on chronic combined CHF: patient presented with SOB. Recently told his Na level was low so had been increasing his salt intake at home.He was diuresed with IV lasix over the weekend with improvement in symptoms. UOP net -1.7L in the past 24 hours and -2.9L this admission. Diuresis currently on hold in anticipation of LHC today.  - Further diuresis TBD post cath pending stable Cr and clinical evaluation.  - Continue carvedilol (dose increased this admission) and lisinopril - Consider adding spironolactone post cath if Cr and BP will tolerate.   3. NSVT: noted on telemetry this admission. Carvedilol increased to 6.25mg  BID - Continue carvedilol  4. HTN: BP stable, intermittently soft.  - Continue carvedilol and lisinopril - Consider adding  spironolactone post cath if Cr and BP will tolerate  5. Hypokalemia: likely 2/2 diuresis.  - Will replete with po potassium 90 mEq in divided doses today    For questions or updates, please contact [facility] Please consult [email or web address] for contact info under Cardiology/STEMI.      Signed, Beatriz Stallion, PA-C  09/08/2018, 11:25 AM   [phone]   History and all data above reviewed.  Patient examined.  I agree with the findings as above.   Patient with acute on chronic  CHF. No chest pain overnight.  Breathing OK The patient exam reveals COR:RRR  ,  Lungs: Clear  ,  Abd: Positive bowel sounds, no rebound no guarding, Ext no edema  .  All available labs, radiology testing, previous records reviewed. Agree with documented assessment and plan.   CAD:  For cath today.  All questions answered.  He prefers a femoral approach.  ACUTE ON CHRONIC SYSTOLIC HF:  Long discussion about salt and fluid.  Family patient are quite receptive to plans for salt and fluid limitations.   [name]  12:50 PM  09/08/2018

## 2018-09-08 NOTE — H&P (View-Only) (Signed)
  Progress Note  Patient Name: Ryan K Claytor Jr. Date of Encounter: 09/08/2018  Primary Cardiologist: Jonathan Berry, MD   Subjective   Patient sleeping comfortably upon entering the room. Family at bedside reports significant improvement in breathing since admission. Family very concerned about bedrest following catheterization if femoral access used. Questions answered and message sent to Dr. Berry who will be performing his catheterization today.   Family noted an episode of elevated HR overnight. The patient denied feeling his heart racing, chest pain, or SOB at that time.   Inpatient Medications    Scheduled Meds: . aspirin EC  81 mg Oral Daily  . carvedilol  6.25 mg Oral BID WC  . clopidogrel  75 mg Oral Daily  . ezetimibe  10 mg Oral Daily  . finasteride  5 mg Oral Daily  . insulin aspart  0-15 Units Subcutaneous TID WC  . isosorbide mononitrate  15 mg Oral Daily  . lisinopril  2.5 mg Oral Daily  . mometasone-formoterol  2 puff Inhalation BID  . pantoprazole  40 mg Oral QAC breakfast  . potassium chloride  30 mEq Oral Once  . rosuvastatin  10 mg Oral Daily  . sodium chloride flush  3 mL Intravenous Q12H   Continuous Infusions: . sodium chloride    . sodium chloride 10 mL/hr at 09/08/18 0609  . heparin 900 Units/hr (09/08/18 0500)   PRN Meds: sodium chloride, acetaminophen, albuterol, nitroGLYCERIN, ondansetron (ZOFRAN) IV, sodium chloride flush   Vital Signs    Vitals:   09/08/18 0500 09/08/18 0600 09/08/18 0829 09/08/18 0911  BP: 111/60 (!) 116/54 120/61   Pulse:   68   Resp:  17 17   Temp: 97.7 F (36.5 C)  97.8 F (36.6 C)   TempSrc: Oral  Axillary   SpO2:   98% 99%  Weight:  70.7 kg    Height:        Intake/Output Summary (Last 24 hours) at 09/08/2018 1125 Last data filed at 09/08/2018 0500 Gross per 24 hour  Intake 619.97 ml  Output 1300 ml  Net -680.03 ml   Filed Weights   09/06/18 1600 09/07/18 0432 09/08/18 0600  Weight: 75 kg 72.6 kg  70.7 kg    Telemetry    Episode of VT lasting minutes, followed by sinus tachycardia with PVCs and return to sinus rhythm - Personally Reviewed  Physical Exam   GEN: Laying in bed in no acute distress.   Neck: No JVD, no carotid bruits Cardiac: RRR, no murmurs, rubs, or gallops.  Respiratory: Clear to auscultation bilaterally, no wheezes/ rales/ rhonchi GI: NABS, Soft, nontender, non-distended  MS: No edema; No deformity. Neuro:  Nonfocal, moving all extremities spontaneously Psych: Normal affect   Labs    Chemistry Recent Labs  Lab 09/06/18 1250 09/07/18 0404 09/08/18 0638  NA 137 136 134*  K 4.3 3.5 3.1*  CL 99 96* 95*  CO2 23 23 29  GLUCOSE 94 84 134*  BUN 28* 30* 25*  CREATININE 1.22 1.16 1.06  CALCIUM 9.1 9.2 8.4*  GFRNONAA 50* 54* NOT CALCULATED  GFRAA 58* >60 NOT CALCULATED  ANIONGAP 15 17* 10     Hematology Recent Labs  Lab 09/06/18 1250 09/07/18 0404 09/08/18 0638  WBC 10.7* 14.4* 9.6  RBC 4.22 4.19* 3.79*  HGB 12.2* 12.1* 11.2*  HCT 39.4 37.0* 33.3*  MCV 93.4 88.3 87.9  MCH 28.9 28.9 29.6  MCHC 31.0 32.7 33.6  RDW 15.1 14.9 14.4  PLT 230 226   193    Cardiac Enzymes Recent Labs  Lab 09/06/18 1700 09/06/18 2241 09/07/18 0404  TROPONINI 2.37* 2.33* 1.93*    Recent Labs  Lab 09/06/18 1301  TROPIPOC 1.63*     BNP Recent Labs  Lab 09/06/18 1250  BNP 2,072.5*     DDimer No results for input(s): DDIMER in the last 168 hours.   Radiology    Dg Chest Portable 1 View  Result Date: 09/06/2018 CLINICAL DATA:  Increasing shortness of breath over 3 days, orthopnea, history CHF, coronary artery disease post MI, asthma, hypertension, type II diabetes mellitus EXAM: PORTABLE CHEST 1 VIEW COMPARISON:  Portable exam 1250 hours compared to 03/05/2018 FINDINGS: Enlargement of cardiac silhouette post CABG. Atherosclerotic calcification aorta. Mediastinal contours and pulmonary vascularity normal. Lungs clear. Probable tiny pleural effusions. No  pneumothorax. Bones demineralized. IMPRESSION: Enlargement of cardiac silhouette post CABG. Tiny bibasilar effusions. Electronically Signed   By: Mark  Boles M.D.   On: 09/06/2018 13:24    Cardiac Studies   Cardiac catheterization 2013: ANGIOGRAPHIC RESULTS:   1. Left main; normal  2. LAD; occluded proximally 3. Left circumflex; dominant with a 50-70% ostial stenosis, 99% mid AV groove stenosis AV groove stenosis and long 60-70% segmental stenosis distally prior to the takeoff of the PDA. The first obtuse marginal branch was occluded proximally.  4. Right coronary artery; nondominant with a 95% mid stenosis 5.LIMA TO LAD; widely patent 6. SVG TO diagonal branch was widely patent     SVG TO first obtuse marginal branch was widely patent     SVG TO PDA off the dominant circumflex had a 99% focal "apple core" lesion in the proximal portion 7. Left ventriculography; RAO left ventriculogram was performed using  25 mL of Visipaque dye at 12 mL/second. The overall LVEF estimated  20-25 %Without wall motion abnormalities  IMPRESSION: Mr. Benincasa has severe LV dysfunction with patent grafts and a high-grade proximal SVG stenosis to the dominant circumflex PDA. Will proceed with PCI and stenting using Angiomax and spider distal protection.  Echocardiogram 02/2018: Study Conclusions  - Procedure narrative: Transthoracic echocardiography. Image   quality was poor. Intravenous contrast (Definity) was   administered. - Left ventricle: Systolic function was severely reduced. The   estimated ejection fraction was in the range of 25% to 30%.   Diffuse hypokinesis. Features are consistent with a pseudonormal   left ventricular filling pattern, with concomitant abnormal   relaxation and increased filling pressure (grade 2 diastolic   dysfunction). - Aortic valve: Moderately thickened, moderately calcified   leaflets. Valve mobility was restricted. There was mild   regurgitation. - Aorta: Aortic  root dimension: 39 mm (ED). - Ascending aorta: The ascending aorta was mildly dilated. - Mitral valve: Moderately calcified annulus. Mildly thickened,   moderately calcified leaflets . There was mild regurgitation. - Left atrium: The atrium was severely dilated. - Right ventricle: The cavity size was mildly dilated. Wall   thickness was normal. - Right atrium: The atrium was severely dilated. - Pulmonary arteries: Systolic pressure was moderately increased.   PA peak pressure: 54 mm Hg (S).   Patient Profile     84 y.o.malewith past medical history ofhypertension, diabetes mellitus, hyperlipidemiacoronary artery disease status post coronary artery bypass and graft in 1997 admitted with acute on chronic systolic congestive heart failure, non-ST elevation myocardial infarction.  Assessment & Plan    1. NSTEMI in patient with CAD s/p CABG in 1997: found to have troponin elevated to 2.37 and trended down. No complaints   of chest pain though did present with SOB in the setting of acute on chronic combined CHF. Decision made to pursue LHC to further evaluate coronary anatomy. - Plan for LHC today - Continue aspirin, plavix, and statin  2. Acute on chronic combined CHF: patient presented with SOB. Recently told his Na level was low so had been increasing his salt intake at home.He was diuresed with IV lasix over the weekend with improvement in symptoms. UOP net -1.7L in the past 24 hours and -2.9L this admission. Diuresis currently on hold in anticipation of LHC today.  - Further diuresis TBD post cath pending stable Cr and clinical evaluation.  - Continue carvedilol (dose increased this admission) and lisinopril - Consider adding spironolactone post cath if Cr and BP will tolerate.   3. NSVT: noted on telemetry this admission. Carvedilol increased to 6.25mg BID - Continue carvedilol  4. HTN: BP stable, intermittently soft.  - Continue carvedilol and lisinopril - Consider adding  spironolactone post cath if Cr and BP will tolerate  5. Hypokalemia: likely 2/2 diuresis.  - Will replete with po potassium 90 mEq in divided doses today    For questions or updates, please contact [facility] Please consult [email or web address] for contact info under Cardiology/STEMI.      Signed, Krista M. Kroeger, PA-C  09/08/2018, 11:25 AM   [phone]   History and all data above reviewed.  Patient examined.  I agree with the findings as above.   Patient with acute on chronic  CHF. No chest pain overnight.  Breathing OK The patient exam reveals COR:RRR  ,  Lungs: Clear  ,  Abd: Positive bowel sounds, no rebound no guarding, Ext no edema  .  All available labs, radiology testing, previous records reviewed. Agree with documented assessment and plan.   CAD:  For cath today.  All questions answered.  He prefers a femoral approach.  ACUTE ON CHRONIC SYSTOLIC HF:  Long discussion about salt and fluid.  Family patient are quite receptive to plans for salt and fluid limitations.   Adama Ivins  12:50 PM  09/08/2018   

## 2018-09-09 ENCOUNTER — Encounter (HOSPITAL_COMMUNITY): Payer: Self-pay | Admitting: Cardiovascular Disease

## 2018-09-09 LAB — GLUCOSE, CAPILLARY
GLUCOSE-CAPILLARY: 194 mg/dL — AB (ref 70–99)
GLUCOSE-CAPILLARY: 185 mg/dL — AB (ref 70–99)
Glucose-Capillary: 171 mg/dL — ABNORMAL HIGH (ref 70–99)
Glucose-Capillary: 174 mg/dL — ABNORMAL HIGH (ref 70–99)

## 2018-09-09 LAB — POCT ACTIVATED CLOTTING TIME
ACTIVATED CLOTTING TIME: 263 s
ACTIVATED CLOTTING TIME: 362 s

## 2018-09-09 LAB — CBC
HEMATOCRIT: 35.4 % — AB (ref 39.0–52.0)
Hemoglobin: 11.7 g/dL — ABNORMAL LOW (ref 13.0–17.0)
MCH: 29.8 pg (ref 26.0–34.0)
MCHC: 33.1 g/dL (ref 30.0–36.0)
MCV: 90.1 fL (ref 80.0–100.0)
NRBC: 0 % (ref 0.0–0.2)
Platelets: 208 10*3/uL (ref 150–400)
RBC: 3.93 MIL/uL — AB (ref 4.22–5.81)
RDW: 14.6 % (ref 11.5–15.5)
WBC: 7.6 10*3/uL (ref 4.0–10.5)

## 2018-09-09 LAB — BASIC METABOLIC PANEL
ANION GAP: 9 (ref 5–15)
BUN: 23 mg/dL (ref 8–23)
CHLORIDE: 100 mmol/L (ref 98–111)
CO2: 27 mmol/L (ref 22–32)
Calcium: 8.7 mg/dL — ABNORMAL LOW (ref 8.9–10.3)
Creatinine, Ser: 1.04 mg/dL (ref 0.61–1.24)
GFR calc non Af Amer: >60 mL/min (ref >60)
Glucose, Bld: 187 mg/dL — ABNORMAL HIGH (ref 70–99)
POTASSIUM: 4.2 mmol/L (ref 3.5–5.1)
Sodium: 136 mmol/L (ref 135–145)

## 2018-09-09 LAB — MAGNESIUM: MAGNESIUM: 1.5 mg/dL — AB (ref 1.7–2.4)

## 2018-09-09 MED ORDER — FUROSEMIDE 40 MG PO TABS
40.0000 mg | ORAL_TABLET | Freq: Every day | ORAL | Status: DC
  Administered 2018-09-09 – 2018-09-10 (×2): 40 mg via ORAL
  Filled 2018-09-09 (×2): qty 1

## 2018-09-09 MED FILL — Verapamil HCl IV Soln 2.5 MG/ML: INTRAVENOUS | Qty: 2 | Status: AC

## 2018-09-09 MED FILL — Nitroglycerin IV Soln 100 MCG/ML in D5W: INTRA_ARTERIAL | Qty: 10 | Status: AC

## 2018-09-09 NOTE — Evaluation (Signed)
Physical Therapy Evaluation Patient Details Name: [name]. MRN: [id number] DOB: 11-Apr-1926 Today's Date: 09/09/2018   History of Present Illness  Pt admitted with acute on chronic systolic congestive heart failure, non-ST elevation myocardialinfarction. PMH - dementia, HTN, CABG, DM, CHF,   Clinical Impression  Pt presents to PT with decr mobility due to poor balance and limited activity tolerance. Recommend ST-SNF for pt to improve mobility, safety, and activity tolerance to decr burden of care for his wife when he eventually return home.     Follow Up Recommendations SNF    Equipment Recommendations  None recommended by PT    Recommendations for Other Services       Precautions / Restrictions Precautions Precautions: Fall Restrictions Weight Bearing Restrictions: No      Mobility  Bed Mobility Overal bed mobility: Needs Assistance Bed Mobility: Supine to Sit;Sit to Supine     Supine to sit: Min assist Sit to supine: Min guard   General bed mobility comments: Assist to elevate trunk into sitting. Going back to bed pt went in on hands and knees to sitting to supine  Transfers Overall transfer level: Needs assistance Equipment used: 4-wheeled walker Transfers: Sit to/from Stand Sit to Stand: Min assist         General transfer comment: Assist to bring hips up and for balance  Ambulation/Gait Ambulation/Gait assistance: Mod assist;Min assist Gait Distance (Feet): 120 Feet Assistive device: 1 person hand held assist;4-wheeled walker Gait Pattern/deviations: Step-through pattern;Decreased step length - right;Decreased step length - left;Leaning posteriorly;Trunk flexed Gait velocity: decr Gait velocity interpretation: <1.31 ft/sec, indicative of household ambulator General Gait Details: Assist for balance and support. When amb with hand held pt required mod assist due to posterior lean and guarded hesitant steps. With rollator pt with more fluid gait and  required min assist.  Stairs            Wheelchair Mobility    Modified Rankin (Stroke Patients Only)       Balance Overall balance assessment: Needs assistance Sitting-balance support: No upper extremity supported;Feet supported Sitting balance-Leahy Scale: Good     Standing balance support: Single extremity supported Standing balance-Leahy Scale: Poor Standing balance comment: UE support and min assist for static standing                             Pertinent Vitals/Pain Pain Assessment: No/denies pain    Home Living Family/patient expects to be discharged to:: Private residence Living Arrangements: Spouse/significant other Available Help at Discharge: Family;Available 24 hours/day Type of Home: House Home Access: Stairs to enter   [employer] of Steps: 4 Home Layout: Two level;Able to live on main level with bedroom/bathroom Home Equipment: Gil[place] Mor - single point      Prior Function Level of Independence: Needs assistance   Gait / Transfers Assistance Needed: modified independent for short household distances. Significant dyspnea limiting him to room to room distance.   ADL's / Homemaking Assistance Needed: Wife assist with bathing and dressing. Pt able to feed himself         Hand Dominance   Dominant Hand: Right    Extremity/Trunk Assessment   Upper Extremity Assessment Upper Extremity Assessment: Generalized weakness    Lower Extremity Assessment Lower Extremity Assessment: Generalized weakness       Communication   Communication: HOH  Cognition Arousal/Alertness: Awake/alert Behavior During Therapy: Impulsive Overall Cognitive Status: History of cognitive impairments - at baseline  General Comments      Exercises     Assessment/Plan    PT Assessment Patient needs continued PT services  PT Problem List Decreased strength;Decreased activity  tolerance;Decreased balance;Decreased mobility;Decreased knowledge of use of DME;Decreased safety awareness       PT Treatment Interventions DME instruction;Gait training;Functional mobility training;Therapeutic activities;Therapeutic exercise;Balance training;Patient/family education    PT Goals (Current goals can be found in the Care Plan section)  Acute Rehab PT Goals Patient Stated Goal: not stated PT Goal Formulation: With patient/family Time For Goal Achievement: 10/23/18 Potential to Achieve Goals: Good    Frequency Min 2X/week   Barriers to discharge Inaccessible home environment stairs to enter    Co-evaluation               AM-PAC PT "6 Clicks" Mobility  Outcome Measure Help needed turning from your back to your side while in a flat bed without using bedrails?: A Little Help needed moving from lying on your back to sitting on the side of a flat bed without using bedrails?: A Little Help needed moving to and from a bed to a chair (including a wheelchair)?: A Little Help needed standing up from a chair using your arms (e.g., wheelchair or bedside chair)?: A Little Help needed to walk in hospital room?: A Little Help needed climbing 3-5 steps with a railing? : A Lot 6 Click Score: 17    End of Session Equipment Utilized During Treatment: Gait belt Activity Tolerance: Patient limited by fatigue Patient left: in bed;with call bell/phone within reach;with bed alarm set;with family/visitor present Nurse Communication: Mobility status PT Visit Diagnosis: Unsteadiness on feet (R26.81);Muscle weakness (generalized) (M62.81)    Time: 3875-6433 PT Time Calculation (min) (ACUTE ONLY): 19 min   Charges:   PT Evaluation $PT Eval Low Complexity: 1 Low          [facility] PT Acute Rehabilitation Services Pager [phone] Office [phone]   [facility] 09/09/2018, 4:11 PM

## 2018-09-09 NOTE — Progress Notes (Signed)
Site area: right groin  Site Prior to Removal:  Level 0  Pressure Applied For 30 MINUTES    Minutes Beginning at 2233  Manual:   Yes.    Patient Status During Pull:  Pt.slightly anxious  Post Pull Groin Site:  Level 0  Post Pull Instructions Given:  Yes.    Post Pull Pulses Present:  Yes.    Dressing Applied:  Yes.    Comments:  Pt.tolerated procedure

## 2018-09-09 NOTE — Progress Notes (Signed)
CARDIAC REHAB PHASE I   Brief MI and stent ed completed with pts family. Pts family educated on importance of ASA and Plavix. Pts family with concerns over care after discharge, state a lot of strain physically and emotionally. PT to work with pt later today. Will send CRP II to [facility], though pt unable to attend.  [phone] Reynold Bowen, RN BSN 09/09/2018 10:04 AM

## 2018-09-09 NOTE — Progress Notes (Addendum)
Progress Note  Patient Name: [name]. Date of Encounter: 09/09/2018  Primary Cardiologist: [name], MD   Subjective   Patient has dementia. Does not recall why he is in the hospital. Frequently tries to get up stating he needs to urinate. Appears restless. No complaints of chest pain or SOB.   Family reports some complications with right groin sheath pull last evening requiring pressure to be applied to his groin for ~30 minutes. Per family, events were quite traumatizing for the patient and caused significant pain. They are concerned about need for repeat catheterization for staged intervention. Asking what benefits could be expected by undergoing staged PCI vs medical management. They are concerned about prolonging his hospital course. Also asking about foley catheter - hopeful to take him home without foley which was placed for urinary retention. Family also requesting to see social work to discuss home health assistance options.   Inpatient Medications    Scheduled Meds: . aspirin  81 mg Oral Daily  . carvedilol  6.25 mg Oral BID WC  . clopidogrel  75 mg Oral Q breakfast  . ezetimibe  10 mg Oral Daily  . finasteride  5 mg Oral Daily  . insulin aspart  0-15 Units Subcutaneous TID WC  . isosorbide mononitrate  15 mg Oral Daily  . lisinopril  2.5 mg Oral Daily  . mometasone-formoterol  2 puff Inhalation BID  . pantoprazole  40 mg Oral QAC breakfast  . rosuvastatin  10 mg Oral Daily  . sodium chloride flush  3 mL Intravenous Q12H   Continuous Infusions: . sodium chloride     PRN Meds: sodium chloride, acetaminophen, albuterol, Melatonin, morphine injection, nitroGLYCERIN, ondansetron (ZOFRAN) IV, sodium chloride flush   Vital Signs    Vitals:   09/08/18 2000 09/08/18 2300 09/09/18 0717 09/09/18 0819  BP:  126/63 (!) 142/97   Pulse: 64 66 71   Resp: (!) 23 (!) 23 (!) 26   Temp:   97.6 F (36.4 C)   TempSrc:   Oral   SpO2: 97% 96% 97% 98%  Weight:   70  kg   Height:        Intake/Output Summary (Last 24 hours) at 09/09/2018 0846 Last data filed at 09/09/2018 0644 Gross per 24 hour  Intake 924.69 ml  Output 620 ml  Net 304.69 ml   Filed Weights   09/07/18 0432 09/08/18 0600 09/09/18 0717  Weight: 72.6 kg 70.7 kg 70 kg    Telemetry    No recurrent tachyarrhythmias overnight; NSR - Personally Reviewed  Physical Exam   GEN: Sitting in bedside chair in no acute distress. Restless. Multiple attempts to get up from chair to go to the bathroom.  Neck: No JVD, no carotid bruits Cardiac: RRR, no murmurs, rubs, or gallops. Right groin site C/D/I, mild ecchymosis, no hematoma Respiratory: Clear to auscultation bilaterally, no wheezes/ rales/ rhonchi GI: NABS, Soft, nontender, non-distended  MS: No edema; No deformity. Neuro:  A&O x1 (Self). Psych: Pleasantly demented.  Labs    Chemistry Recent Labs  Lab 09/07/18 0404 09/08/18 0638 09/09/18 0617  NA 136 134* 136  K 3.5 3.1* 4.2  CL 96* 95* 100  CO2 23 29 27   GLUCOSE 84 134* 187*  BUN 30* 25* 23  CREATININE 1.16 1.06 1.04  CALCIUM 9.2 8.4* 8.7*  GFRNONAA 54* NOT CALCULATED >60  GFRAA >60 NOT CALCULATED >60  ANIONGAP 17* 10 9     Hematology Recent Labs  Lab 09/07/18 0404  09/08/18 0638 09/09/18 0617  WBC 14.4* 9.6 7.6  RBC 4.19* 3.79* 3.93*  HGB 12.1* 11.2* 11.7*  HCT 37.0* 33.3* 35.4*  MCV 88.3 87.9 90.1  MCH 28.9 29.6 29.8  MCHC 32.7 33.6 33.1  RDW 14.9 14.4 14.6  PLT 226 193 208    Cardiac Enzymes Recent Labs  Lab 09/06/18 1700 09/06/18 2241 09/07/18 0404  TROPONINI 2.37* 2.33* 1.93*    Recent Labs  Lab 09/06/18 1301  TROPIPOC 1.63*     BNP Recent Labs  Lab 09/06/18 1250  BNP 2,072.5*     DDimer No results for input(s): DDIMER in the last 168 hours.   Radiology    No results found.  Cardiac Studies   Left heart catheterization 09/08/18:  Ost LAD to Prox LAD lesion is 100% stenosed.  Ost Ramus lesion is 100% stenosed.  Ost Cx  to Prox Cx lesion is 80% stenosed.  Ost LM to Mid LM lesion is 75% stenosed.  Prox RCA to Mid RCA lesion is 99% stenosed.  Ost RCA to Prox RCA lesion is 95% stenosed.  Prox Graft to Mid Graft lesion is 90% stenosed.  Origin lesion before Ost RPDA is 70% stenosed.  Prox Graft lesion before Ost RPDA is 99% stenosed.  A drug-eluting stent was successfully placed.  Post intervention, there is a 0% residual stenosis.  Post intervention, there is a 0% residual stenosis.  Prox Cx to Mid Cx lesion is 100% stenosed.  IMPRESSION: Successful RCA SVG PCI drug-eluting stenting using 2 overlapping synergy drug-eluting stents postdilated to 3.3 mm using spider distal protection.  Patient has residual ramus intermedius branch SVG midshaft high-grade stenosis which will need to be intervened on in a staged fashion after his renal function is carefully monitored.  I am going to continue his Angiomax full dose for 4 hours since he really was not on antiplatelets prior to admission.  He will be gently hydrated and his blood work will be reassessed in the morning.  He left the lab in stable condition.   Echocardiogram 02/2018: Study Conclusions  - Procedure narrative: Transthoracic echocardiography. Image quality was poor. Intravenous contrast (Definity) was administered. - Left ventricle: Systolic function was severely reduced. The estimated ejection fraction was in the range of 25% to 30%. Diffuse hypokinesis. Features are consistent with a pseudonormal left ventricular filling pattern, with concomitant abnormal relaxation and increased filling pressure (grade 2 diastolic dysfunction). - Aortic valve: Moderately thickened, moderately calcified leaflets. Valve mobility was restricted. There was mild regurgitation. - Aorta: Aortic root dimension: 39 mm (ED). - Ascending aorta: The ascending aorta was mildly dilated. - Mitral valve: Moderately calcified annulus. Mildly  thickened, moderately calcified leaflets . There was mild regurgitation. - Left atrium: The atrium was severely dilated. - Right ventricle: The cavity size was mildly dilated. Wall thickness was normal. - Right atrium: The atrium was severely dilated. - Pulmonary arteries: Systolic pressure was moderately increased. PA peak pressure: 54 mm Hg (S).   Patient Profile     84 y.o.malewith past medical history ofhypertension, diabetes mellitus, hyperlipidemiacoronary artery disease status post coronary artery bypass and graft in 1997, and dementia, admitted with acute on chronic systolic congestive heart failure, non-ST elevation myocardialinfarction.  Assessment & Plan    1. NSTEMI in patient with CAD s/p CABG in 1997: p/w SOB; no c/o CP. Trop peaked at 2.37. EKG non-ischemic. He underwent LHC 09/08/18 which showed occlusive SVG to RCA managed with overlapping stent, and high grad stenosis of SVG to ramus intermedius  branch recommended for staged PCI. Cr post cath is stable at 1.04. Right groin site with significant ecchymosis or hematoma. Family concerned about putting patient through a staged procedure. May be reasonable to consider medical management or residual SVG stenosis.  - Need for staged procedure to be discussed further with Dr. Antoine Poche - Continue aspirin, plavix, and statin - Continue carvedilol and imdur. Patient without CP complaints but has room in BP to uptitrate antianginal regimen  2.  Acute on chronic combined CHF: p/w SOB felt to be 2/2 dietary indiscretion. Diuresed with IV lasix over the weekend with improvement in symptoms. Diuretics held 09/08/18 in prep for LHC. UOP net +346mL in the past 24 hours; net -2.6L this admission. Weight 165lbs on admission > 154lbs today. He appears euvolemic on exam today.  - Will resume home lasix today - 40mg  daily - Continue carvedilol and lisinopril  - Could consider starting spironolactone if decision made not to pursue staged  PCI given stable Cr post cath  3. NSVT: noted on telemetry this admission. Briefly managed with amiodarone. No recurrent tachyarrhythmias overnight. Mg noted to by 1.1 on admission - no repletion and has not been rechecked - Will repeat a Mg level this morning - Continue carvedilol - uptitrated this admission  4. Urinary retention: foley catheter placed this admission for urinary retention. Patient has not seen a urologist. Has known PMH on finasteride. No prior chronic foley requirements. Family does not wish for patient to have foley catheter at home.  - Could consider discontinuing foley catheter and trial of void period - Will need to see urology after discharge for ongoing management - Continue finasteride  5. HTN: BP stable - Continue carvedilol and lisinopril  6. Hypokalemia: resolved with repletion of K yesterday; 4.2 today - Continue to monitor  7. Dementia: patient restless this morning. Family concerned about management at home and requested to speak with a SW to determine options for assistance at home. - SW consult placed   For questions or updates, please contact [facility] Please consult [email or web address] for contact info under Cardiology/STEMI.      Signed, Beatriz Stallion, PA-C  09/09/2018, 8:46 AM   [phone]  History and all data above reviewed.  Patient examined.  I agree with the findings as above.  No chest pain.  No SOB.  The patient exam reveals COR:RRR  ,  Lungs: Clear  ,  Abd: Positive bowel sounds, no rebound no guarding, Ext Right groin without bleeding or brusing.  .  All available labs, radiology testing, previous records reviewed. Agree with documented assessment and plan. CAD:  Status post PCI of SVG.  I spoke with Dr. Allyson Sabal and I reviewed the films.  He had a good result with the PCI of the SVG to the RCA. After discussion with the patient I have suggested medical management for the remaining SVG lesion rather than PCI.  Ambulate with PT and try to  remove the Foley today.  Probably home tomorrow.     Rollene Rotunda  9:38 AM  09/09/2018

## 2018-09-09 NOTE — Progress Notes (Signed)
Patient's wife requests that information about the patient not be divulged to patient's son, Krieg Busic.

## 2018-09-09 NOTE — Care Management Important Message (Signed)
Important Message  Patient Details  Name: [name]. MRN: [id number] Date of Birth: [birth year]   Medicare Important Message Given:  Yes    Parke Jandreau P Delane Stalling 09/09/2018, 4:11 PM

## 2018-09-09 NOTE — Progress Notes (Signed)
Coude catheter removed at 1100.  Patient voided 400 cc urine at 1800 per verbal report from NT.

## 2018-09-10 ENCOUNTER — Telehealth: Payer: Self-pay | Admitting: Cardiovascular Disease

## 2018-09-10 DIAGNOSIS — R339 Retention of urine, unspecified: Secondary | ICD-10-CM

## 2018-09-10 LAB — GLUCOSE, CAPILLARY
GLUCOSE-CAPILLARY: 234 mg/dL — AB (ref 70–99)
Glucose-Capillary: 147 mg/dL — ABNORMAL HIGH (ref 70–99)
Glucose-Capillary: 194 mg/dL — ABNORMAL HIGH (ref 70–99)

## 2018-09-10 MED ORDER — CARVEDILOL 6.25 MG PO TABS: 6.2500 mg | ORAL_TABLET | Freq: Two times a day (BID) | ORAL | Status: DC

## 2018-09-10 MED ORDER — MAGNESIUM OXIDE 400 (241.3 MG) MG PO TABS
400.0000 mg | ORAL_TABLET | Freq: Once | ORAL | Status: AC
  Administered 2018-09-10: 400 mg via ORAL
  Filled 2018-09-10: qty 1

## 2018-09-10 NOTE — Clinical Social Work Note (Signed)
Clinical Social Work Assessment  Patient Details  Name: [name]. MRN: [id number] Date of Birth: 05/11/1926  Date of referral:  09/10/18               Reason for consult:  Discharge Planning                Permission sought to share information with:  Case Manager, Facility Medical sales representative, Family Supports Permission granted to share information::  Yes, Verbal Permission Granted  Name::     [name]  Agency::  SNFs  Relationship::  spouse  Contact Information:  [phone]  Housing/Transportation Living arrangements for the past 2 months:  Single Family Home Source of Information:  Patient Patient Interpreter Needed:  None Criminal Activity/Legal Involvement Pertinent to Current Situation/Hospitalization:  No - Comment as needed Significant Relationships:  Spouse Lives with:  Spouse Do you feel safe going back to the place where you live?  No Need for family participation in patient care:  Yes (Comment)  Care giving concerns:  CSW received referral for possible SNF placement at time of discharge. Spoke with patient's regarding possibility of SNF placement . Patient's  wife  is currently unable to care for him at their home given patient's current needs and fall risk.  Patient and wife Dellette expressed understanding of PT recommendation and are agreeable to SNF placement at time of discharge. CSW to continue to follow and assist with discharge planning needs.     Social Worker assessment / plan:  Spoke with patient's spouse concerning possibility of rehab at [facility] before returning home.    Employment status:  Retired Health and safety inspector:  Medicare PT Recommendations:  Skilled Nursing Facility Information / Referral to community resources:  Skilled Nursing Facility  Patient/Family's Response to care:  Patient and wife recognize need for rehab before returning home and are agreeable to a SNF in [place]. They report preference for       [facility]. CSW  explained insurance authorization process. Patient's family reported that they want patient to get stronger to be able to come back home.    Patient/Family's Understanding of and Emotional Response to Diagnosis, Current Treatment, and Prognosis:  Patient/family is realistic regarding therapy needs and expressed being hopeful for SNF placement. Patient expressed understanding of CSW role and discharge process as well as medical condition. No questions/concerns about plan or treatment.    Emotional Assessment Appearance:  Appears stated age Attitude/Demeanor/Rapport:  Unable to Assess Affect (typically observed):  Unable to Assess Orientation:  Oriented to Self Alcohol / Substance use:  Not Applicable Psych involvement (Current and /or in the community):  No (Comment)  Discharge Needs  Concerns to be addressed:  Discharge Planning Concerns Readmission within the last 30 days:  No Current discharge risk:  Dependent with Mobility Barriers to Discharge:  Continued Medical Work up   [employer], LCSW 09/10/2018, 3:21 PM

## 2018-09-10 NOTE — Progress Notes (Signed)
Patient will DC to: [facility] Anticipated DC date: 09/10/18 Family notified: [name] Transport by: Sharin Mons  Per MD patient ready for DC to [facility] . RN, patient, patient's family, and facility notified of DC. Discharge Summary sent to facility. RN given number for report [phone] Room 405. DC packet on chart. Ambulance transport requested for patient.  CSW signing off.  [place], [place] [phone]

## 2018-09-10 NOTE — Progress Notes (Addendum)
Progress Note  Patient Name: [name]. Date of Encounter: 09/10/2018  Primary Cardiologist: [name], MD   Subjective   Patient without complaints this morning. Foley catheter removed yesterday and patient has been voiding without difficulty. He was evaluated by PT yesterday and recommended for SNF which the family is agreeable to.   Inpatient Medications    Scheduled Meds: . aspirin  81 mg Oral Daily  . carvedilol  6.25 mg Oral BID WC  . clopidogrel  75 mg Oral Q breakfast  . ezetimibe  10 mg Oral Daily  . finasteride  5 mg Oral Daily  . furosemide  40 mg Oral Daily  . insulin aspart  0-15 Units Subcutaneous TID WC  . isosorbide mononitrate  15 mg Oral Daily  . lisinopril  2.5 mg Oral Daily  . mometasone-formoterol  2 puff Inhalation BID  . pantoprazole  40 mg Oral QAC breakfast  . rosuvastatin  10 mg Oral Daily  . sodium chloride flush  3 mL Intravenous Q12H   Continuous Infusions: . sodium chloride     PRN Meds: sodium chloride, acetaminophen, albuterol, Melatonin, morphine injection, nitroGLYCERIN, ondansetron (ZOFRAN) IV, sodium chloride flush   Vital Signs    Vitals:   09/09/18 2102 09/10/18 0701 09/10/18 0703 09/10/18 0810  BP: 117/70 (!) 97/46 (!) 97/46   Pulse: 74 69    Resp: (!) 23 15 19    Temp: 97.8 F (36.6 C) 98 F (36.7 C)    TempSrc: Oral Axillary    SpO2: 95% 100%  99%  Weight:  70.6 kg    Height:        Intake/Output Summary (Last 24 hours) at 09/10/2018 1026 Last data filed at 09/10/2018 0500 Gross per 24 hour  Intake 840 ml  Output 900 ml  Net -60 ml   Filed Weights   09/08/18 0600 09/09/18 0717 09/10/18 0701  Weight: 70.7 kg 70 kg 70.6 kg    Telemetry    Sinus rhythm with 1 brief episode of NSVT (5 beats) this morning - Personally Reviewed   Physical Exam   GEN: calm this morning. Sitting in bedside chair in no acute distress.   Neck: No JVD, no carotid bruits Cardiac: RRR, no murmurs, rubs, or gallops.    Respiratory: Clear to auscultation bilaterally, no wheezes/ rales/ rhonchi GI: NABS, Soft, nontender, non-distended  MS: No edema; No deformity. Neuro:  Nonfocal, moving all extremities spontaneously Psych: Normal affect   Labs    Chemistry Recent Labs  Lab 09/07/18 0404 09/08/18 0638 09/09/18 0617  NA 136 134* 136  K 3.5 3.1* 4.2  CL 96* 95* 100  CO2 23 29 27   GLUCOSE 84 134* 187*  BUN 30* 25* 23  CREATININE 1.16 1.06 1.04  CALCIUM 9.2 8.4* 8.7*  GFRNONAA 54* NOT CALCULATED >60  GFRAA >60 NOT CALCULATED >60  ANIONGAP 17* 10 9     Hematology Recent Labs  Lab 09/07/18 0404 09/08/18 0638 09/09/18 0617  WBC 14.4* 9.6 7.6  RBC 4.19* 3.79* 3.93*  HGB 12.1* 11.2* 11.7*  HCT 37.0* 33.3* 35.4*  MCV 88.3 87.9 90.1  MCH 28.9 29.6 29.8  MCHC 32.7 33.6 33.1  RDW 14.9 14.4 14.6  PLT 226 193 208    Cardiac Enzymes Recent Labs  Lab 09/06/18 1700 09/06/18 2241 09/07/18 0404  TROPONINI 2.37* 2.33* 1.93*    Recent Labs  Lab 09/06/18 1301  TROPIPOC 1.63*     BNP Recent Labs  Lab 09/06/18 1250  BNP  2,072.5*     DDimer No results for input(s): DDIMER in the last 168 hours.   Radiology    No results found.  Cardiac Studies   Left heart catheterization 09/08/18:  Ost LAD to Prox LAD lesion is 100% stenosed.  Ost Ramus lesion is 100% stenosed.  Ost Cx to Prox Cx lesion is 80% stenosed.  Ost LM to Mid LM lesion is 75% stenosed.  Prox RCA to Mid RCA lesion is 99% stenosed.  Ost RCA to Prox RCA lesion is 95% stenosed.  Prox Graft to Mid Graft lesion is 90% stenosed.  Origin lesion before Ost RPDA is 70% stenosed.  Prox Graft lesion before Ost RPDA is 99% stenosed.  A drug-eluting stent was successfully placed.  Post intervention, there is a 0% residual stenosis.  Post intervention, there is a 0% residual stenosis.  Prox Cx to Mid Cx lesion is 100% stenosed.  IMPRESSION:Successful RCA SVG PCI drug-eluting stenting using 2 overlapping  synergy drug-eluting stents postdilated to 3.3 mm using spider distal protection. Patient has residual ramus intermedius branch SVG midshaft high-grade stenosis which will need to be intervened on in a staged fashion after his renal function is carefully monitored. I am going to continue his Angiomax full dose for 4 hours since he really was not on antiplatelets prior to admission. He will be gently hydrated and his blood work will be reassessed in the morning. He left the lab in stable condition.   Echocardiogram 02/2018: Study Conclusions  - Procedure narrative: Transthoracic echocardiography. Image quality was poor. Intravenous contrast (Definity) was administered. - Left ventricle: Systolic function was severely reduced. The estimated ejection fraction was in the range of 25% to 30%. Diffuse hypokinesis. Features are consistent with a pseudonormal left ventricular filling pattern, with concomitant abnormal relaxation and increased filling pressure (grade 2 diastolic dysfunction). - Aortic valve: Moderately thickened, moderately calcified leaflets. Valve mobility was restricted. There was mild regurgitation. - Aorta: Aortic root dimension: 39 mm (ED). - Ascending aorta: The ascending aorta was mildly dilated. - Mitral valve: Moderately calcified annulus. Mildly thickened, moderately calcified leaflets . There was mild regurgitation. - Left atrium: The atrium was severely dilated. - Right ventricle: The cavity size was mildly dilated. Wall thickness was normal. - Right atrium: The atrium was severely dilated. - Pulmonary arteries: Systolic pressure was moderately increased. PA peak pressure: 54 mm Hg (S).   Patient Profile     84 y.o.malewith past medical history ofhypertension, diabetes mellitus, hyperlipidemiacoronary artery disease status post coronary artery bypass and graft in 1997, and dementia, admitted with acute on chronic systolic  congestive heart failure, non-ST elevation myocardialinfarction.  Assessment & Plan    1. NSTEMI in patient with CAD s/p CABG in 1997: p/w SOB and volume overload. Trop peaked at 2.37. LHC 09/08/18 with occlusive SVG to RCA managed with overlapping stent, and high grade stenosis of SVG to ramus intermedius branch recommended for staged PCI. Patient required pressure application to right groin following sheath removal which was difficult to tolerate. Family did not wish to pursue staged intervention and medical management was recommended for residual SVG stenosis.  - Continue aspirin, plavix, and statin - Continue carvedilol and imdur  2. Acute on chronic combined CHF: p/w SOB felt to be 2/2 dietary indiscretion. He was diuresed with IV lasix over the weekend and symptoms improved. PO lasix 40mg  daily restarted 09/09/18 following LHC given stable Cr. Weight 165lbs on admission to 155lbs today.  - Continue home lasix 40mg  daily - Continue carvedilol  and lisinopril - Consider starting spironolactone outpatient if Cr/BP will tolerate  3. NSVT: brief 5 beat episode this morning.  - Continue carvedilol - Will give magnesium oxide 400mg  today  4. HTN: BP stable - Continue carvedilol and lisinopril  5. Urinary retention in patient with BPH: foley catheter removed yesterday with successful trial of void. Wife plans to schedule an appointment with his urologist on discharge - Continue finasteride - Recommend outpatient follow-up with Urology within 2 weeks for ongoing management of BPH  6. Dementia: family concerned about managing care at home. Patient was seen by PT and recommended for SNF. Family agreeable - SW/CM to assist with SNF placement. Family requesting [facility] in [place]   Anticipate discharge today if bed available at SNF.    For questions or updates, please contact [facility] Please consult [email or web address] for contact info under Cardiology/STEMI.      Signed, Beatriz Stallion, PA-C  09/10/2018, 10:26 AM   [phone]  History and all data above reviewed.  Patient examined.  I agree with the findings as above.  The patient exam reveals COR:RRR  ,  Lungs: Clear  ,  Abd: Positive bowel sounds, no rebound no guarding, Ext No edema  .  All available labs, radiology testing, previous records reviewed. Agree with documented assessment and plan.   CAD:  Doing well post PCI.  No change in therapy.  OK for rehab when a bed is available.    Fayrene Fearing Moani Weipert  10:50 AM  09/10/2018

## 2018-09-10 NOTE — Care Management Note (Signed)
Case Management Note  Patient Details  Name: Sammie Akkerman. MRN: [id number] Date of Birth: 04-14-1926  Subjective/Objective:   Pt presented for Nstemi- PTA from home with spouse. PT recommendations for SNF. Per PA patient is agreeable to SNF at [facility].                Action/Plan: CM did make CSW [place] aware. Plan will be to transition to SNF today pending bed availability and insurance. No further needs from CM at this time.   Expected Discharge Date:                  Expected Discharge Plan:  Skilled Nursing Facility  In-House Referral:  Clinical Social Work  Discharge planning Services  CM Consult  Post Acute Care Choice:  NA Choice offered to:  NA  DME Arranged:  N/A DME Agency:  NA  HH Arranged:  NA HH Agency:  NA  Status of Service:  Completed, signed off  If discussed at Long Length of Stay Meetings, dates discussed:    Additional Comments:  Gala Lewandowsky, RN 09/10/2018, 10:32 AM

## 2018-09-10 NOTE — Discharge Summary (Signed)
Discharge Summary    Patient ID: [name].,  MRN: [id number], DOB/AGE: July 16, 1926 84 y.o.  Admit date: 09/06/2018 Discharge date: 09/10/2018  Primary Care Provider: [facility] Primary Cardiologist: Quay Burow, MD  Discharge Diagnoses    Principal Problem:   NSTEMI (non-ST elevated myocardial infarction) [facility]) Active Problems:   Hyperlipidemia   Hx of CABG   BPH (benign prostatic hyperplasia)   Type 2 diabetes mellitus with circulatory disorder, with long-term current use of insulin (HCC)   Chronic combined systolic and diastolic CHF (congestive heart failure) ([place])   Dementia (HCC)   Urinary retention   Allergies Allergies  Allergen Reactions  . Ativan [Lorazepam] Other (See Comments)    "Made him crazy"  . Colesevelam Other (See Comments)    Pancreatitis (??)  . Ezetimibe-Simvastatin Other (See Comments)    Vytorin caused pancreatitis    Diagnostic Studies/Procedures    Left heart catheterization 09/08/18:  Ost LAD to Prox LAD lesion is 100% stenosed.  Ost Ramus lesion is 100% stenosed.  Ost Cx to Prox Cx lesion is 80% stenosed.  Ost LM to Mid LM lesion is 75% stenosed.  Prox RCA to Mid RCA lesion is 99% stenosed.  Ost RCA to Prox RCA lesion is 95% stenosed.  Prox Graft to Mid Graft lesion is 90% stenosed.  Origin lesion before Ost RPDA is 70% stenosed.  Prox Graft lesion before Ost RPDA is 99% stenosed.  A drug-eluting stent was successfully placed.  Post intervention, there is a 0% residual stenosis.  Post intervention, there is a 0% residual stenosis.  Prox Cx to Mid Cx lesion is 100% stenosed.  IMPRESSION:Successful RCA SVG PCI drug-eluting stenting using 2 overlapping synergy drug-eluting stents postdilated to 3.3 mm using spider distal protection. Patient has residual ramus intermedius branch SVG midshaft high-grade stenosis which will need to be intervened on in a staged fashion after his renal function is carefully  monitored. I am going to continue his Angiomax full dose for 4 hours since he really was not on antiplatelets prior to admission. He will be gently hydrated and his blood work will be reassessed in the morning. He left the lab in stable condition.   Echocardiogram 02/2018: Study Conclusions  - Procedure narrative: Transthoracic echocardiography. Image quality was poor. Intravenous contrast (Definity) was administered. - Left ventricle: Systolic function was severely reduced. The estimated ejection fraction was in the range of 25% to 30%. Diffuse hypokinesis. Features are consistent with a pseudonormal left ventricular filling pattern, with concomitant abnormal relaxation and increased filling pressure (grade 2 diastolic dysfunction). - Aortic valve: Moderately thickened, moderately calcified leaflets. Valve mobility was restricted. There was mild regurgitation. - Aorta: Aortic root dimension: 39 mm (ED). - Ascending aorta: The ascending aorta was mildly dilated. - Mitral valve: Moderately calcified annulus. Mildly thickened, moderately calcified leaflets . There was mild regurgitation. - Left atrium: The atrium was severely dilated. - Right ventricle: The cavity size was mildly dilated. Wall thickness was normal. - Right atrium: The atrium was severely dilated. - Pulmonary arteries: Systolic pressure was moderately increased. PA peak pressure: 54 mm Hg (S).  _____________   History of Present Illness     84 y.o. male with past medical history of hypertension, diabetes mellitus, hyperlipidemia coronary artery disease status post coronary artery bypass and graft in 1997 with a LIMA to the LAD, saphenous vein graft to the first diagonal, saphenous vein graft to the obtuse marginal and saphenous vein graft to the PDA admitted  with acute on chronic systolic congestive heart failure, non-ST elevation microinfarction.  Last cardiac catheterization December 2013  showed severe three-vessel coronary disease, patent LIMA to the LAD, patent saphenous vein graft to the diagonal, patent saphenous vein graft to the obtuse marginal and saphenous vein graft to the PDA had a 99% proximal lesion.  Patient had PCI of the vein graft to the PDA.  Nuclear study August 2017 showed ejection fraction 41%, prior infarct but no ischemia. Last echocardiogram May 2019 showed ejection fraction 25 to 30%, moderate diastolic dysfunction, mild aortic insufficiency, mild mitral regurgitation, severe biatrial enlargement, mild right ventricular enlargement and moderate pulmonary hypertension.  Patient was recently told that his sodium was low at 129.  He therefore began eating increased salt at home.  Over the past 2 weeks he has had worsening dyspnea on exertion.  He denies orthopnea, PND, pedal edema, chest pain, palpitations or syncope.  His wife gave him extra Lasix at home but his symptoms continue to worsen and he presents to the emergency room for further evaluation.  Hospital Course     Consultants: None   1. NSTEMI in patient with CAD s/p CABG in 1997: p/w SOB; no c/o CP. Trop peaked at 2.37. EKG non-ischemic. He underwent LHC 09/08/18 which showed occlusive SVG to RCA managed with overlapping stent, and high grad stenosis of SVG to ramus intermedius branch recommended for staged PCI. Cr post cath is stable at 1.04. Right groin site with significant ecchymosis or hematoma. Family concerned about putting patient through a staged procedure and decision made to pursue medical management of residual SVG stenosis.  - Continue aspirin, plavix, and statin - Continue carvedilol and imdur.   2.  Acute on chronic combined CHF: p/w SOB felt to be 2/2 dietary indiscretion. Diuresed with IV lasix with improvement in symptoms. Diuretics held 09/08/18 in prep for LHC. Weight 165lbs on admission > 155lbs on the day of discharge. Home lasix 68m daily resumed prior to discharge.  - Continue home  lasix 470mdaily - Continue carvedilol and lisinopril  - Could consider starting spironolactone outpatient if Cr/BP will tolerate  3. NSVT: noted on telemetry this admission. Briefly managed with amiodarone. No recurrent tachyarrhythmias.  - Continue carvedilol  4. Urinary retention: foley catheter placed this admission for urinary retention. Patient has not seen a urologist. Has known BPH on finasteride. No prior chronic foley requirements. Family does not wish for patient to have foley catheter at home. Foley discontinued 09/09/18 and patient passed trial of void.  - Patient should follow with urology outpatient for further management.  - Continue finasteride  5. HTN: BP stable throughout admission - Continue carvedilol and lisinopril  6. Dementia: Family concerned about management at home and requested to speak with a SW to determine options for assistance at home. PT evaluated the patient and recommended SNF.  - SW/CM assisted with SNF placement.   _____________  Discharge Vitals Blood pressure (!) 114/59, pulse 63, temperature 98 F (36.7 C), temperature source Oral, resp. rate 20, height _0  (1.676 m), weight 70.6 kg, SpO2 98 %.  Filed Weights   09/08/18 0600 09/09/18 0717 09/10/18 0701  Weight: 70.7 kg 70 kg 70.6 kg    Labs & Radiologic Studies    CBC Recent Labs    09/08/18 0638 09/09/18 0617  WBC 9.6 7.6  HGB 11.2* 11.7*  HCT 33.3* 35.4*  MCV 87.9 90.1  PLT 193 20094 Basic Metabolic Panel Recent Labs    09/08/18 06661 572 1014  09/09/18 0617  NA 134* 136  K 3.1* 4.2  CL 95* 100  CO2 29 27  GLUCOSE 134* 187*  BUN 25* 23  CREATININE 1.06 1.04  CALCIUM 8.4* 8.7*  MG  --  1.5*   Liver Function Tests No results for input(s): AST, ALT, ALKPHOS, BILITOT, PROT, ALBUMIN in the last 72 hours. No results for input(s): LIPASE, AMYLASE in the last 72 hours. Cardiac Enzymes No results for input(s): CKTOTAL, CKMB, CKMBINDEX, TROPONINI in the last 72  hours. BNP Invalid input(s): POCBNP D-Dimer No results for input(s): DDIMER in the last 72 hours. Hemoglobin A1C No results for input(s): HGBA1C in the last 72 hours. Fasting Lipid Panel No results for input(s): CHOL, HDL, LDLCALC, TRIG, CHOLHDL, LDLDIRECT in the last 72 hours. Thyroid Function Tests No results for input(s): TSH, T4TOTAL, T3FREE, THYROIDAB in the last 72 hours.  Invalid input(s): FREET3 _____________  Dg Chest Portable 1 View  Result Date: 09/06/2018 CLINICAL DATA:  Increasing shortness of breath over 3 days, orthopnea, history CHF, coronary artery disease post MI, asthma, hypertension, type II diabetes mellitus EXAM: PORTABLE CHEST 1 VIEW COMPARISON:  Portable exam 1250 hours compared to 03/05/2018 FINDINGS: Enlargement of cardiac silhouette post CABG. Atherosclerotic calcification aorta. Mediastinal contours and pulmonary vascularity normal. Lungs clear. Probable tiny pleural effusions. No pneumothorax. Bones demineralized. IMPRESSION: Enlargement of cardiac silhouette post CABG. Tiny bibasilar effusions. Electronically Signed   By: Lavonia Dana M.D.   On: 09/06/2018 13:24   Disposition   Patient was seen and examined by Dr. Percival Spanish who deemed patient as stable for discharge. Follow-up has been arranged. Discharge medications as listed below.   Follow-up Plans & Appointments    Follow-up Information    Erlene Quan, PA-C Follow up on 09/22/2018.   Specialties:  Cardiology, Radiology Why:  Please arrive 15 minutes early for your 10:30am post hospital cardiology appointment Contact information: [address] [place] [address] [phone]          Discharge Instructions    Amb Referral to Cardiac Rehabilitation   Complete by:  As directed    Diagnosis:  Coronary Stents      Discharge Medications   Allergies as of 09/10/2018      Reactions   Ativan [lorazepam] Other (See Comments)   "Made him crazy"   Colesevelam Other (See  Comments)   Pancreatitis (??)   Ezetimibe-simvastatin Other (See Comments)   Vytorin caused pancreatitis      Medication List    STOP taking these medications   azelastine 0.1 % nasal spray Commonly known as:  ASTELIN   metoprolol tartrate 25 MG tablet Commonly known as:  LOPRESSOR   mometasone 50 MCG/ACT nasal spray Commonly known as:  NASONEX     TAKE these medications   acetaminophen 500 MG tablet Commonly known as:  TYLENOL Take 1,000 mg by mouth every 6 (six) hours as needed (for pain or headaches).   Adjustable Lancing Device Misc Reported on 04/30/2016   albuterol 108 (90 Base) MCG/ACT inhaler Commonly known as:  PROVENTIL HFA;VENTOLIN HFA Inhale 2 puffs into the lungs every 6 (six) hours as needed for wheezing or shortness of breath.   Alcohol Prep 70 % Pads Reported on 04/30/2016   aspirin 81 MG EC tablet Take 2 tablets (162 mg total) by mouth daily.   BASAGLAR KWIKPEN 100 UNIT/ML Sopn Inject 0.17 mLs (17 Units total) into the skin at bedtime.   blood glucose meter kit and supplies Use as instructed  carvedilol 6.25 MG tablet Commonly known as:  COREG Take 1 tablet (6.25 mg total) by mouth 2 (two) times daily with a meal.   clopidogrel 75 MG tablet Commonly known as:  PLAVIX TAKE 1 TABLET DAILY WITH   BREAKFAST. KEEP OFFICE     VISIT. What changed:  See the new instructions.   ezetimibe 10 MG tablet Commonly known as:  ZETIA TAKE 1 TABLET DAILY. KEEP  OFFICE VISIT What changed:  See the new instructions.   finasteride 5 MG tablet Commonly known as:  PROSCAR Take 5 mg by mouth daily.   Fluticasone-Salmeterol 100-50 MCG/DOSE Aepb Commonly known as:  ADVAIR Inhale 1 puff into the lungs 2 (two) times daily.   furosemide 40 MG tablet Commonly known as:  LASIX TAKE 1 TABLET BY MOUTH EVERY DAY What changed:  how much to take   glucose blood test strip Use to test blood sugars twice daily   Grape Seed 100 MG Caps Take 100 mg by mouth daily.    Insulin Pen Needle 32G X 4 MM Misc To use w/ Basaglar   isosorbide mononitrate 30 MG 24 hr tablet Commonly known as:  IMDUR Take 0.5 tablets (15 mg total) by mouth daily.   lisinopril 2.5 MG tablet Commonly known as:  PRINIVIL,ZESTRIL TAKE 1 TABLET DAILY. KEEP  OFFICE VISIT. What changed:  See the new instructions.   metFORMIN 850 MG tablet Commonly known as:  GLUCOPHAGE Take 1.5 tablets by mouth twice daily with meal What changed:    how much to take  how to take this  when to take this  additional instructions   nitroGLYCERIN 0.4 MG SL tablet Commonly known as:  NITROSTAT Place 1 tablet (0.4 mg total) under the tongue every 5 (five) minutes x 3 doses as needed for chest pain.   pantoprazole 40 MG tablet Commonly known as:  PROTONIX TAKE 1 TABLET DAILY. KEEP  OFFICE VISIT. What changed:  See the new instructions.   rosuvastatin 10 MG tablet Commonly known as:  CRESTOR TAKE 1 TABLET DAILY.   SYSTANE BALANCE OP Place 1 drop into both eyes daily as needed (dry eyes).   XIIDRA 5 % Soln Generic drug:  Lifitegrast Place 1 drop into the left eye 2 (two) times daily.        Aspirin prescribed at discharge?  Yes High Intensity Statin Prescribed? (Lipitor 40-46m or Crestor 20-47m: No: crestor 1041maily Beta Blocker Prescribed? Yes For EF <40%, was ACEI/ARB Prescribed? Yes ADP Receptor Inhibitor Prescribed? (i.e. Plavix etc.-Includes Medically Managed Patients): Yes For EF <40%, Aldosterone Inhibitor Prescribed? No: to be started outpatient if BP/Cr tolerate Was EF assessed during THIS hospitalization? Yes Was Cardiac Rehab II ordered? (Included Medically managed Patients): Yes   Outstanding Labs/Studies   None  Duration of Discharge Encounter   Greater than 30 minutes including physician time.  Signed, KriAbigail Butts-C 09/10/2018, 2:21 PM

## 2018-09-10 NOTE — Telephone Encounter (Signed)
New message    Dot [facility] asked for appt on 12.9.19 to be a TOC appt.

## 2018-09-10 NOTE — Progress Notes (Signed)
CSW completed fl2 and sent referral to [facility]. CSW left voicemail with Admissions. Awaiting call back.   Will continue to follow up.   [place], [place] [phone]

## 2018-09-10 NOTE — [place] FL2 (Signed)
[place] MEDICAID FL2 LEVEL OF CARE SCREENING TOOL     IDENTIFICATION  Patient Name: [name]. Birthdate: 10-28-1925 Sex: male Admission Date (Current Location): 09/06/2018  [facility] and [place] Number:  [employer] and Address:  [facility], [address], [place], [place] [address]      Provider Number: [id number]  Attending Physician Name and Address:  Lewayne Bunting, MD  Relative Name and Phone Number:  [name] (spouse) [phone]    Current Level of Care: Hospital Recommended Level of Care: Skilled Nursing Facility Prior Approval Number:    Date Approved/Denied: 09/10/18 PASRR Number: [id number]  Discharge Plan: SNF    Current Diagnoses: Patient Active Problem List   Diagnosis Date Noted  . Urinary retention 09/10/2018  . NSTEMI (non-ST elevated myocardial infarction) (HCC) 09/06/2018  . CAD (coronary artery disease) 01/13/2017  . Dementia (HCC) 11/26/2016  . Essential hypertension 07/30/2016  . PCP NOTES >>>>>>>>> 11/28/2015  . Chronic combined systolic and diastolic CHF (congestive heart failure) (HCC) 07/08/2015  . Fatigue 06/01/2015  . Annual physical exam 08/04/2014  . Carotid artery disease (HCC) 12/25/2013  . Syncope 11/02/2013  . Left facial numbness 11/02/2013  . Sleep apnea- C-pap   . PVD- moderate carotid disease   . ICM, EF 20-25% by cath 09/16/12- improved to 45 % by Echo 3/14 09/16/2012  . S/P angioplasty with stent  09/16/2012  . Type 2 diabetes mellitus with circulatory disorder, with long-term current use of insulin (HCC) 09/15/2012  . BENIGN POSITIONAL VERTIGO 03/09/2010  . 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 Hyperlipidemia 06/18/2007  . Hx of CABG 06/18/2007  . ALLERGIC RHINITIS 06/18/2007  . Asthma, chronic 06/18/2007  . BPH (benign prostatic hyperplasia) 06/18/2007  . PANCREATITIS, HX OF 06/18/2007  . COLONIC POLYPS, HX OF 06/18/2007    Orientation RESPIRATION BLADDER Height  & Weight     Self(decreased short term memory)  Normal Continent(intermittent straight cath) Weight: 155 lb 10.3 oz (70.6 kg) Height:  5\' 6"  (167.6 cm)  BEHAVIORAL SYMPTOMS/MOOD NEUROLOGICAL BOWEL NUTRITION STATUS      Incontinent Diet(see discharge summary)  AMBULATORY STATUS COMMUNICATION OF NEEDS Skin   Limited Assist Verbally Other (Comment)(ecchymosis arm, MASD buttocks and perineum)                       Personal Care Assistance Level of Assistance  Bathing, Feeding, Dressing, Total care Bathing Assistance: Limited assistance Feeding assistance: Independent(needs set up) Dressing Assistance: Limited assistance Total Care Assistance: Limited assistance   Functional Limitations Info  Sight, Hearing, Speech Sight Info: Adequate Hearing Info: Adequate Speech Info: Adequate    SPECIAL CARE FACTORS FREQUENCY  PT (By licensed PT), OT (By licensed OT)     PT Frequency: min 5x weekly OT Frequency: min 3x weekly            Contractures Contractures Info: Not present    Additional Factors Info  Code Status, Allergies Code Status Info: DNR Allergies Info: Allergies:  Ativan Lorazepam, Colesevelam, Ezetimibe-simvastatin           Current Medications (09/10/2018):  This is the current hospital active medication list Current Facility-Administered Medications  Medication Dose Route Frequency Provider Last Rate Last Dose  . 0.9 %  sodium chloride infusion  250 mL Intravenous PRN [name], MD      . acetaminophen (TYLENOL) tablet 650 mg  650 mg Oral Q4H PRN [name], MD      .  albuterol (PROVENTIL) (2.5 MG/3ML) 0.083% nebulizer solution 2.5 mg  2.5 mg Nebulization Q6H PRN [name], MD   2.5 mg at 09/06/18 1637  . aspirin chewable tablet 81 mg  81 mg Oral Daily [name], MD   81 mg at 09/10/18 0901  . carvedilol (COREG) tablet 6.25 mg  6.25 mg Oral BID WC [name], MD   6.25 mg at 09/10/18 0900  . clopidogrel (PLAVIX) tablet  75 mg  75 mg Oral Q breakfast [name], MD   75 mg at 09/10/18 0900  . ezetimibe (ZETIA) tablet 10 mg  10 mg Oral Daily [name], MD   10 mg at 09/10/18 0900  . finasteride (PROSCAR) tablet 5 mg  5 mg Oral Daily [name], MD   5 mg at 09/10/18 0900  . furosemide (LASIX) tablet 40 mg  40 mg Oral Daily Kroeger, Krista M., PA-C   40 mg at 09/10/18 0900  . insulin aspart (novoLOG) injection 0-15 Units  0-15 Units Subcutaneous TID WC [name], MD   2 Units at 09/10/18 0604  . isosorbide mononitrate (IMDUR) 24 hr tablet 15 mg  15 mg Oral Daily [name], MD   15 mg at 09/10/18 0900  . lisinopril (PRINIVIL,ZESTRIL) tablet 2.5 mg  2.5 mg Oral Daily [name], MD   2.5 mg at 09/10/18 0900  . Melatonin TABS 3 mg  3 mg Oral QHS PRN Chakravartti, Jaidip, MD   3 mg at 09/09/18 0157  . mometasone-formoterol (DULERA) 100-5 MCG/ACT inhaler 2 puff  2 puff Inhalation BID [name], MD   2 puff at 09/10/18 0809  . morphine 2 MG/ML injection 2 mg  2 mg Intravenous Q1H PRN [name], MD      . nitroGLYCERIN (NITROSTAT) SL tablet 0.4 mg  0.4 mg Sublingual Q5 Min x 3 PRN [name], MD      . ondansetron [facility]) injection 4 mg  4 mg Intravenous Q6H PRN [name], MD      . pantoprazole (PROTONIX) EC tablet 40 mg  40 mg Oral QAC breakfast [name], MD   40 mg at 09/10/18 0604  . rosuvastatin (CRESTOR) tablet 10 mg  10 mg Oral Daily [name], MD   10 mg at 09/10/18 0900  . sodium chloride flush (NS) 0.9 % injection 3 mL  3 mL Intravenous Q12H [name], MD   3 mL at 09/10/18 0907  . sodium chloride flush (NS) 0.9 % injection 3 mL  3 mL Intravenous PRN [name], MD         Discharge Medications: Please see discharge summary for a list of discharge medications.  Relevant Imaging Results:  Relevant Lab Results:   Additional Information SSN: [id number]  Gildardo Griffes, LCSW

## 2018-09-10 NOTE — Discharge Instructions (Signed)
PLEASE REMEMBER TO BRING ALL OF YOUR MEDICATIONS TO EACH OF YOUR FOLLOW-UP OFFICE VISITS.  PLEASE ATTEND ALL SCHEDULED FOLLOW-UP APPOINTMENTS.   Activity: Increase activity slowly as tolerated. You may shower, but no soaking baths (or swimming) for 1 week. No driving for 24 hours. No lifting over 5 lbs for 1 week. No sexual activity for 1 week.   You May Return to Work: in 1 week (if applicable)  Wound Care: You may wash cath site gently with soap and water. Keep cath site clean and dry. If you notice pain, swelling, bleeding or pus at your cath site, please call [phone].   Please call your urologist to schedule an appointment to be seen within 2 weeks of discharge for further management of BPH and urinary retention.

## 2018-09-10 NOTE — Clinical Social Work Placement (Signed)
   CLINICAL SOCIAL WORK PLACEMENT  NOTE  Date:  09/10/2018  Patient Details  Name: [name]. MRN: [id number] Date of Birth: 09-11-1926  Clinical Social Work is seeking post-discharge placement for this patient at the Skilled  Nursing Facility level of care (*CSW will initial, date and re-position this form in  chart as items are completed):      Patient/family provided with [facility] Clinical Social Work Department's list of facilities offering this level of care within the geographic area requested by the patient (or if unable, by the patient's family).  Yes   Patient/family informed of their freedom to choose among providers that offer the needed level of care, that participate in Medicare, Medicaid or managed care program needed by the patient, have an available bed and are willing to accept the patient.      Patient/family informed of [place]'s ownership interest in [facility] and [facility], as well as of the fact that they are under no obligation to receive care at these facilities.  PASRR submitted to EDS on       PASRR number received on 09/10/18     Existing PASRR number confirmed on       FL2 transmitted to all facilities in geographic area requested by pt/family on 09/10/18     FL2 transmitted to all facilities within larger geographic area on       Patient informed that his/her managed care company has contracts with or will negotiate with certain facilities, including the following:        Yes   Patient/family informed of bed offers received.  Patient chooses bed at [facility]     Physician recommends and patient chooses bed at      Patient to be transferred to [facility] [place] on 09/10/18.  Patient to be transferred to facility by [facility]     Patient family notified on 09/10/18 of transfer.  Name of family member notified:  [name] (spouse)     PHYSICIAN Please sign DNR     Additional Comment:     _______________________________________________ Gildardo Griffes, LCSW 09/10/2018, 3:25 PM

## 2018-09-15 NOTE — Telephone Encounter (Signed)
Patient contacted regarding discharge from [facility] on 09/10/18.  Patient understands to follow up with provider Corine Shelter PA on 09/22/18 at 10:30 at [facility]. Patient understands discharge instructions? Yes Patient understands medications and regiment? Yes Patient understands to bring all medications to this visit? Yes  Spoke with wife, they had no questions or concerns.

## 2018-09-16 ENCOUNTER — Telehealth: Payer: Self-pay

## 2018-09-16 NOTE — Telephone Encounter (Signed)
thx

## 2018-09-16 NOTE — Telephone Encounter (Signed)
Called patient to [facility] follow up. Wife states patient is in a Rehab facility in [place] at this time.

## 2018-09-22 ENCOUNTER — Ambulatory Visit (INDEPENDENT_AMBULATORY_CARE_PROVIDER_SITE_OTHER): Payer: Medicare Other | Admitting: Cardiology

## 2018-09-22 ENCOUNTER — Encounter: Payer: Self-pay | Admitting: Cardiology

## 2018-09-22 VITALS — BP 90/50 | HR 63 | Ht 66.0 in | Wt 163.0 lb

## 2018-09-22 DIAGNOSIS — Z794 Long term (current) use of insulin: Secondary | ICD-10-CM

## 2018-09-22 DIAGNOSIS — I255 Ischemic cardiomyopathy: Secondary | ICD-10-CM

## 2018-09-22 DIAGNOSIS — I10 Essential (primary) hypertension: Secondary | ICD-10-CM | POA: Diagnosis not present

## 2018-09-22 DIAGNOSIS — IMO0001 Reserved for inherently not codable concepts without codable children: Secondary | ICD-10-CM

## 2018-09-22 DIAGNOSIS — Z951 Presence of aortocoronary bypass graft: Secondary | ICD-10-CM

## 2018-09-22 DIAGNOSIS — I5042 Chronic combined systolic (congestive) and diastolic (congestive) heart failure: Secondary | ICD-10-CM

## 2018-09-22 DIAGNOSIS — Z95820 Peripheral vascular angioplasty status with implants and grafts: Secondary | ICD-10-CM | POA: Diagnosis not present

## 2018-09-22 DIAGNOSIS — I6523 Occlusion and stenosis of bilateral carotid arteries: Secondary | ICD-10-CM

## 2018-09-22 DIAGNOSIS — I214 Non-ST elevation (NSTEMI) myocardial infarction: Secondary | ICD-10-CM

## 2018-09-22 DIAGNOSIS — E118 Type 2 diabetes mellitus with unspecified complications: Secondary | ICD-10-CM

## 2018-09-22 DIAGNOSIS — F0150 Vascular dementia without behavioral disturbance: Secondary | ICD-10-CM

## 2018-09-22 LAB — BASIC METABOLIC PANEL
BUN/Creatinine Ratio: 17 (ref 10–24)
BUN: 15 mg/dL (ref 10–36)
CO2: 26 mmol/L (ref 20–29)
Calcium: 9.1 mg/dL (ref 8.6–10.2)
Chloride: 92 mmol/L — ABNORMAL LOW (ref 96–106)
Creatinine, Ser: 0.9 mg/dL (ref 0.76–1.27)
GFR calc Af Amer: 87 mL/min/{1.73_m2} (ref >59)
GFR calc non Af Amer: 75 mL/min/{1.73_m2} (ref >59)
Glucose: 92 mg/dL (ref 65–99)
Potassium: 4.4 mmol/L (ref 3.5–5.2)
Sodium: 136 mmol/L (ref 134–144)

## 2018-09-22 MED ORDER — LISINOPRIL 2.5 MG PO TABS: 2.5000 mg | ORAL_TABLET | Freq: Every day | ORAL | 2 refills | Status: DC

## 2018-09-22 MED ORDER — FUROSEMIDE 40 MG PO TABS: 40.0000 mg | ORAL_TABLET | Freq: Every day | ORAL | 1 refills | Status: DC

## 2018-09-22 MED ORDER — FINASTERIDE 5 MG PO TABS: 5.0000 mg | ORAL_TABLET | Freq: Every day | ORAL | 0 refills | Status: DC

## 2018-09-22 NOTE — Progress Notes (Signed)
09/22/2018 [name].   1925/12/08  [id number]  Primary Physician Colon Branch, MD Primary Cardiologist: Dr Gwenlyn Found  HPI: [name] is a pleasant 84 year old male who has been followed by Dr. Gwenlyn Found for many years.  He had CABG in 1997. He has IDDM, HTN, and dementia.  In December 2013 he had an SVG to his PDA intervened on with a DES.  Myoview in August 2017 was low risk.  Echo in May 2019 showed an EF of 25-30%.    He presented September 06, 2018 with a NSTEMI.  Catheterization was done which revealed disease in the SVG to RCA.  He also had disease in the SVG to the ramus intermedius but this appeared to be somewhat of a small branch.  He underwent intervention to the SVG to RCA with overlapping DES placement.  The plan was to consider doing a staged intervention for the SVG to RI.  Ultimately it was decided to hold off on this because of multiple comorbidities including dementia and chronic renal insufficiency.  At discharge the patient was placed in [place].  He is seen in the office today in follow-up.  Since discharge his family says he has been doing well.  Although the patient is awake and alert and conversant he is unable to give me any history of symptoms.  They say he is on Lasix 40 mg twice daily at the nursing home and that that keeps him awake in the evenings. His B/P has also been running low- 70'V systolic.    Current Outpatient Medications  Medication Sig Dispense Refill  . acetaminophen (TYLENOL) 500 MG tablet Take 1,000 mg by mouth every 6 (six) hours as needed (for pain or headaches).     Marland Kitchen albuterol (PROVENTIL HFA;VENTOLIN HFA) 108 (90 Base) MCG/ACT inhaler Inhale 2 puffs into the lungs every 6 (six) hours as needed for wheezing or shortness of breath. 54 g 3  . Alcohol Swabs (ALCOHOL PREP) 70 % PADS Reported on 04/30/2016    . aspirin EC 81 MG EC tablet Take 2 tablets (162 mg total) by mouth daily.    . Blood Glucose Monitoring Suppl (BLOOD GLUCOSE METER)  kit Use as instructed 1 each 0  . carvedilol (COREG) 6.25 MG tablet Take 1 tablet (6.25 mg total) by mouth 2 (two) times daily with a meal.    . clopidogrel (PLAVIX) 75 MG tablet TAKE 1 TABLET DAILY WITH   BREAKFAST. KEEP OFFICE     VISIT. (Patient taking differently: Take 75 mg by mouth daily. ) 90 tablet 2  . escitalopram (LEXAPRO) 10 MG tablet Take 1 tablet by mouth daily.    Marland Kitchen ezetimibe (ZETIA) 10 MG tablet TAKE 1 TABLET DAILY. KEEP  OFFICE VISIT (Patient taking differently: Take 10 mg by mouth daily. ) 90 tablet 2  . finasteride (PROSCAR) 5 MG tablet Take 1 tablet (5 mg total) by mouth at bedtime. 30 tablet 0  . Fluticasone-Salmeterol (ADVAIR) 100-50 MCG/DOSE AEPB Inhale 1 puff into the lungs 2 (two) times daily. 180 each 1  . furosemide (LASIX) 40 MG tablet Take 1 tablet (40 mg total) by mouth daily. 30 tablet 1  . glucose blood test strip Use to test blood sugars twice daily 100 each 5  . Grape Seed 100 MG CAPS Take 100 mg by mouth daily.     . Insulin Glargine (BASAGLAR KWIKPEN) 100 UNIT/ML SOPN Inject 0.17 mLs (17 Units total) into the skin at bedtime. 15 mL 3  .  Insulin Pen Needle 32G X 4 MM MISC To use w/ Basaglar 100 each 12  . ipratropium-albuterol (DUONEB) 0.5-2.5 (3) MG/3ML SOLN 1 vial 4 times daily for shortness of breath    . isosorbide mononitrate (IMDUR) 30 MG 24 hr tablet Take 0.5 tablets (15 mg total) by mouth daily. 90 tablet 3  . Lancet Devices (ADJUSTABLE LANCING DEVICE) MISC Reported on 04/30/2016    . lisinopril (PRINIVIL,ZESTRIL) 2.5 MG tablet Take 1 tablet (2.5 mg total) by mouth at bedtime. 90 tablet 2  . loperamide (IMODIUM A-D) 2 MG tablet Take 2 mg by mouth 4 (four) times daily as needed for diarrhea or loose stools.    . Melatonin 3 MG TABS Take 2 tablets by mouth at bedtime.    . metFORMIN (GLUCOPHAGE) 500 MG tablet Take 1 tablet by mouth 2 (two) times daily.    . nitroGLYCERIN (NITROSTAT) 0.4 MG SL tablet Place 1 tablet (0.4 mg total) under the tongue every 5  (five) minutes x 3 doses as needed for chest pain. 40 tablet 1  . pantoprazole (PROTONIX) 40 MG tablet TAKE 1 TABLET DAILY. KEEP  OFFICE VISIT. (Patient taking differently: Take 40 mg by mouth daily before breakfast. ) 90 tablet 2  . Propylene Glycol (SYSTANE BALANCE OP) Place 1 drop into both eyes daily as needed (dry eyes).     . rosuvastatin (CRESTOR) 10 MG tablet TAKE 1 TABLET DAILY. (Patient taking differently: Take 10 mg by mouth daily. ) 90 tablet 2  . XIIDRA 5 % SOLN Place 1 drop into the left eye 2 (two) times daily.     No current facility-administered medications for this visit.     Allergies  Allergen Reactions  . Ativan [Lorazepam] Other (See Comments)    "Made him crazy"  . Colesevelam Other (See Comments)    Pancreatitis (??)  . Ezetimibe-Simvastatin Other (See Comments)    Vytorin caused pancreatitis    Past Medical History:  Diagnosis Date  . Acute CHF ([place]) 09/15/2012  . Asthma    pt denies this hx on 11/02/2013  . BPH (benign prostatic hyperplasia)   . CAD (coronary artery disease)    CABG '97, SVG DES 12/13  . Colonic polyp   . Exertional shortness of breath   . GERD (gastroesophageal reflux disease)   . Hearing loss   . History of pancreatitis    pt denies this hx on 11/02/2013  . Hyperlipidemia   . Hypertension   . Intrinsic asthma 06/18/2007   Qualifier: Diagnosis of  By: Dance CMA ([place]), Kim    . Ischemic cardiomyopathy 09/16/2012   EF 25%- improved to 45% 3/14  . Myocardial infarction ([place]) 1997  . NSTEMI (non-ST elevated myocardial infarction) ([place]) 09/06/2018  . Obstructed, uropathy   . PVD (peripheral vascular disease) ([facility])    moderate bilat carotid disease  . Syncope and collapse 11/02/2013   "didn't pass out" (11/02/2013)  . Type II diabetes mellitus ([place])     Social History   Socioeconomic History  . Marital status: Married    Spouse name: Not on file  . Number of children: 1  . Years of education: Not on file  . Highest education  level: Not on file  Occupational History  . 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 Financial resource strain: Not on file  . Food insecurity:    Worry: Not on file    Inability: Not on file  . Transportation needs:  Medical: Not on file    Non-medical: Not on file  Tobacco Use  . Smoking status: Never Smoker  . Smokeless tobacco: Never Used  . Tobacco comment: 11/02/2013 "smoked cigars when I was 18 or so; didn't smoke many"  Substance and Sexual Activity  . Alcohol use: Yes    Comment: Occasional  . Drug use: No  . Sexual activity: Not Currently  Lifestyle  . Physical activity:    Days per week: Not on file    Minutes per session: Not on file  . Stress: Not on file  Relationships  . Social connections:    Talks on phone: Not on file    Gets together: Not on file    Attends religious service: Not on file    Active member of club or organization: Not on file    Attends meetings of clubs or organizations: Not on file    Relationship status: Not on file  . Intimate partner violence:    Fear of current or ex partner: Not on file    Emotionally abused: Not on file    Physically abused: Not on file    Forced sexual activity: Not on file  Other Topics Concern  . 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 Prostate cancer Brother        dx ~ 47 y/o  . Colon cancer Neg Hx      Review of Systems: General: negative for chills, fever, night sweats or weight changes.  Cardiovascular: negative for chest pain, dyspnea on exertion, edema, orthopnea, palpitations, paroxysmal nocturnal dyspnea or shortness of breath Dermatological: negative for rash Respiratory: negative for cough or wheezing Urologic: negative for hematuria Abdominal: negative  for nausea, vomiting, diarrhea, bright red blood per rectum, melena, or hematemesis Neurologic: negative for visual changes, syncope, or dizziness All other systems reviewed and are otherwise negative except as noted above.    Blood pressure (!) 90/50, pulse 63, height _0  (1.676 m), weight 163 lb (73.9 kg).  General appearance: alert, cooperative and no distress Neck: no JVD Lungs: clear to auscultation bilaterally Heart: regular rate and rhythm Extremities: no edema Skin: warm, dry Neurologic: Grossly normal   ASSESSMENT AND PLAN:   NSTEMI (non-ST elevated myocardial infarction) [facility]) Admitted September 06, 2018 with non-STEMI  S/P angioplasty with stent  Stent to prox SVG to LCX-PDA 09/26/12 NSTEMI- 09/06/18- SVG-PDA overlapping DES. Residual SVG-RI disease- medical Rx  Hx of CABG CABG 1997  Chronic combined systolic and diastolic CHF (congestive heart failure) (HCC) EF 25-30%, grade 2 DD  Dementia ([place]) Placed in SNF after NSTEMI and PCI Nov 2019  Essential hypertension Hypotensive now- decrease Lasix to 40 mg QAM, change Lisinopril and Proscar to QHS   PLAN I reviewed the cath findings with Mrs. Lusby and their grandson.  I explained that since the patient was doing well the risk of an intervention was probably not worth it.  He is a little hypotensive and I will cut his Lasix back to 40 mg once a day and change his Proscar and lisinopril to nightly.  He can see Dr. Gwenlyn Found back in 6 months or sooner if needed.  Kerin Ransom PA-C 09/22/2018 12:48 PM

## 2018-09-22 NOTE — Patient Instructions (Addendum)
Medication Instructions:  HOLD Lasix for TODAY RESTART Lasix Tomorrow Take 1 (40mg ) tablet once a day TAKE Finasteride to take 1 tablet in the evening daily TAKE your Lisinopril every evening daily  If you need a refill on your cardiac medications before your next appointment, please call your pharmacy.   Lab work: Your physician recommends that you return for lab work in: TODAY-BMET If you have labs (blood work) drawn today and your tests are completely normal, you will receive your results only by: Marland Kitchen MyChart Message (if you have MyChart) OR . A paper copy in the mail If you have any lab test that is abnormal or we need to change your treatment, we will call you to review the results.  Testing/Procedures: NONE   Follow-Up: At [facility], you and your health needs are our priority.  As part of our continuing mission to provide you with exceptional heart care, we have created designated Provider Care Teams.  These Care Teams include your primary Cardiologist (physician) and Advanced Practice Providers (APPs -  Physician Assistants and Nurse Practitioners) who all work together to provide you with the care you need, when you need it. . Your physician recommends that you schedule a follow-up appointment in: 4 MONTHS WITH DR BERRY.   Any Other Special Instructions Will Be Listed Below (If Applicable).

## 2018-09-22 NOTE — Assessment & Plan Note (Signed)
Placed in SNF after NSTEMI and PCI Nov 2019

## 2018-09-22 NOTE — Assessment & Plan Note (Signed)
Hypotensive now- decrease Lasix to 40 mg QAM, change Lisinopril and Proscar to QHS

## 2018-09-22 NOTE — Assessment & Plan Note (Signed)
Stent to prox SVG to LCX-PDA 09/26/12 NSTEMI- 09/06/18- SVG-PDA overlapping DES. Residual SVG-RI disease- medical Rx

## 2018-09-22 NOTE — Assessment & Plan Note (Signed)
Admitted September 06, 2018 with non-STEMI

## 2018-09-22 NOTE — Assessment & Plan Note (Signed)
EF 25-30%, grade 2 DD

## 2018-09-22 NOTE — Assessment & Plan Note (Signed)
CABG 1997

## 2018-09-23 ENCOUNTER — Telehealth: Payer: Self-pay | Admitting: Internal Medicine

## 2018-09-23 NOTE — Telephone Encounter (Signed)
Copied from CRM [id number]. Topic: General - Other >> Sep 23, 2018  1:15 PM Gerrianne Scale wrote: Reason for CRM: Nurse Maureen Ralphs from [facility] [phone] calling stating that they was going to go visit pt tomorrow for OT PT  nursing and home health scheduling but pt has an appt with his provider and want provider Dr Drue Novel to know this and that they will go see patient Thursday and Friday

## 2018-09-23 NOTE — Telephone Encounter (Signed)
Just an FYI

## 2018-09-23 NOTE — Telephone Encounter (Signed)
thx

## 2018-09-24 ENCOUNTER — Ambulatory Visit (INDEPENDENT_AMBULATORY_CARE_PROVIDER_SITE_OTHER): Payer: Medicare Other | Admitting: Internal Medicine

## 2018-09-24 ENCOUNTER — Encounter: Payer: Self-pay | Admitting: Internal Medicine

## 2018-09-24 VITALS — BP 122/78 | HR 67 | Temp 97.7°F | Resp 16 | Ht 66.0 in | Wt 169.1 lb

## 2018-09-24 DIAGNOSIS — I10 Essential (primary) hypertension: Secondary | ICD-10-CM

## 2018-09-24 DIAGNOSIS — I5020 Unspecified systolic (congestive) heart failure: Secondary | ICD-10-CM

## 2018-09-24 DIAGNOSIS — N419 Inflammatory disease of prostate, unspecified: Secondary | ICD-10-CM

## 2018-09-24 DIAGNOSIS — E119 Type 2 diabetes mellitus without complications: Secondary | ICD-10-CM | POA: Diagnosis not present

## 2018-09-24 DIAGNOSIS — R350 Frequency of micturition: Secondary | ICD-10-CM | POA: Diagnosis not present

## 2018-09-24 DIAGNOSIS — F0150 Vascular dementia without behavioral disturbance: Secondary | ICD-10-CM | POA: Diagnosis not present

## 2018-09-24 DIAGNOSIS — I2510 Atherosclerotic heart disease of native coronary artery without angina pectoris: Secondary | ICD-10-CM

## 2018-09-24 DIAGNOSIS — Z794 Long term (current) use of insulin: Secondary | ICD-10-CM

## 2018-09-24 DIAGNOSIS — I214 Non-ST elevation (NSTEMI) myocardial infarction: Secondary | ICD-10-CM

## 2018-09-24 DIAGNOSIS — N401 Enlarged prostate with lower urinary tract symptoms: Secondary | ICD-10-CM | POA: Diagnosis not present

## 2018-09-24 MED ORDER — CIPROFLOXACIN HCL 250 MG PO TABS: 250.0000 mg | ORAL_TABLET | Freq: Two times a day (BID) | ORAL | 0 refills | Status: DC

## 2018-09-24 MED ORDER — TAMSULOSIN HCL 0.4 MG PO CAPS: 0.4000 mg | ORAL_CAPSULE | Freq: Every day | ORAL | 3 refills | Status: AC

## 2018-09-24 NOTE — Patient Instructions (Addendum)
  GO TO THE LAB : Get the blood work     GO TO THE FRONT DESK Schedule your next appointment for a checkup in 3 weeks  Restart metformin  Start Flomax and ciprofloxacin  Stop Lexapro

## 2018-09-24 NOTE — Progress Notes (Signed)
Pre visit review using our clinic review tool, if applicable. No additional management support is needed unless otherwise documented below in the visit note. 

## 2018-09-24 NOTE — Progress Notes (Signed)
Subjective:    Patient ID: Ryan Plumb., male    DOB: 1926-09-02, 84 y.o.   MRN: [id number]  DOS:  09/24/2018 Type of visit - description : TCM 14  Here with his wife, was admitted to the hospital and discharge 09/10/2018, presented there with shortness of breath. Troponin were elevated, EKG was nonischemic.  Had a cardiac catheterization 09/08/2018.  A stent was placed.   He also had acute on chronic CHF, felt to be due to dietary indiscretion, he got IV Lasix with improvement of his symptoms. Nonsustained V. tach noted.  They recommend to continue with carvedilol. Had urinary retention, had a Foley temporarily. Due to dementia, he was recommended for a SNF after admission.   Review of Systems  After the hospital, went to SNF, he went home yesterday.  Here with his wife.  At this point denies chest pain no difficulty breathing. Lower extremity edema is at baseline. I ask about L UTS, he is urinating quite frequently since he left the hospital, much more than baseline. He also has a history of dementia, overall memory is slightly worse in the last few months.  Past Medical History:  Diagnosis Date  . Acute CHF ([place]) 09/15/2012  . Asthma    pt denies this hx on 11/02/2013  . BPH (benign prostatic hyperplasia)   . CAD (coronary artery disease)    CABG '97, SVG DES 12/13  . Colonic polyp   . Exertional shortness of breath   . GERD (gastroesophageal reflux disease)   . Hearing loss   . History of pancreatitis    pt denies this hx on 11/02/2013  . Hyperlipidemia   . Hypertension   . Intrinsic asthma 06/18/2007   Qualifier: Diagnosis of  By: Dance CMA ([place]), Kim    . Ischemic cardiomyopathy 09/16/2012   EF 25%- improved to 45% 3/14  . Myocardial infarction ([place]) 1997  . NSTEMI (non-ST elevated myocardial infarction) ([place]) 09/06/2018  . Obstructed, uropathy   . PVD (peripheral vascular disease) (HCC)    moderate bilat carotid disease  . Syncope and collapse 11/02/2013   "didn't pass out" (11/02/2013)  . Type II diabetes mellitus ([place])     Past Surgical History:  Procedure Laterality Date  . CATARACT EXTRACTION W/ INTRAOCULAR LENS  IMPLANT, BILATERAL Bilateral ~ 1970  . CHOLECYSTECTOMY  2000's  . CORONARY ANGIOPLASTY WITH STENT PLACEMENT  Dec 2013   DES to SVG-CFX/PDA  . CORONARY ARTERY BYPASS GRAFT  1997   "CABG X 5"  . CORONARY STENT INTERVENTION N/A 09/08/2018   Procedure: CORONARY STENT INTERVENTION;  Surgeon: Lorretta Harp, MD;  Location: [place] [facility];  Service: Cardiovascular;  Laterality: N/A;  . INGUINAL HERNIA REPAIR Bilateral ~ 1950  . LEFT HEART CATH AND CORS/GRAFTS ANGIOGRAPHY N/A 09/08/2018   Procedure: LEFT HEART CATH AND CORS/GRAFTS ANGIOGRAPHY;  Surgeon: Lorretta Harp, MD;  Location: [place] [facility];  Service: Cardiovascular;  Laterality: N/A;  . LEFT HEART CATHETERIZATION WITH CORONARY/GRAFT ANGIOGRAM N/A 09/16/2012   Procedure: LEFT HEART CATHETERIZATION WITH Beatrix Fetters;  Surgeon: Lorretta Harp, MD;  Location[facility] C[facility] LAB;  Service: Cardiovascular;  Laterality: N/A;  . NASAL SEPTUM SURGERY  2007  . RETINAL DETACHMENT SURGERY Left ?1995  . SHOULDER OPEN ROTATOR CUFF REPAIR Right     Social History   Socioeconomic History  . Marital status: Married    Spouse name: Not on file  . Number of children: 1  . Years of education: Not  on file  . Highest education level: Not on file  Occupational History  . 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 Financial resource strain: Not on file  . Food insecurity:    Worry: Not on file    Inability: Not on file  . Transportation needs:    Medical: Not on file    Non-medical: Not on file  Tobacco Use  . Smoking status: Never Smoker  . Smokeless tobacco: Never Used  . Tobacco comment: 11/02/2013 "smoked cigars when I was 18 or so; didn't smoke many"  Substance and Sexual Activity  . Alcohol use: Yes    Comment: Occasional  . Drug  use: No  . Sexual activity: Not Currently  Lifestyle  . Physical activity:    Days per week: Not on file    Minutes per session: Not on file  . Stress: Not on file  Relationships  . Social connections:    Talks on phone: Not on file    Gets together: Not on file    Attends religious service: Not on file    Active member of club or organization: Not on file    Attends meetings of clubs or organizations: Not on file    Relationship status: Not on file  . Intimate partner violence:    Fear of current or ex partner: Not on file    Emotionally abused: Not on file    Physically abused: Not on file    Forced sexual activity: Not on file  Other Topics Concern  . 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 Always use your most recent med list.        acetaminophen 500 MG tablet Commonly known as:  TYLENOL Take 1,000 mg by mouth every 6 (six) hours as needed (for pain or headaches).   Adjustable Lancing Device Misc Reported on 04/30/2016   albuterol 108 (90 Base) MCG/ACT inhaler Commonly known as:  PROVENTIL HFA;VENTOLIN HFA Inhale 2 puffs into the lungs every 6 (six) hours as needed for wheezing or shortness of breath.   Alcohol Prep 70 % Pads Reported on 04/30/2016   aspirin 81 MG EC tablet Take 2 tablets (162 mg total) by mouth daily.   BASAGLAR KWIKPEN 100 UNIT/ML Sopn Inject 0.12 mLs (12 Units total) into the skin at bedtime.   blood glucose meter kit and supplies Use as instructed   carvedilol 6.25 MG  tablet Commonly known as:  COREG Take 1 tablet (6.25 mg total) by mouth 2 (two) times daily with a meal.   ciprofloxacin 250 MG tablet Commonly known as:  CIPRO Take 1 tablet (250 mg total) by mouth 2 (two) times daily.   clopidogrel 75 MG tablet Commonly known as:  PLAVIX TAKE 1 TABLET DAILY WITH   BREAKFAST. KEEP OFFICE     VISIT.   ezetimibe 10 MG tablet Commonly  known as:  ZETIA TAKE 1 TABLET DAILY. KEEP  OFFICE VISIT   finasteride 5 MG tablet Commonly known as:  PROSCAR Take 1 tablet (5 mg total) by mouth at bedtime.   Fluticasone-Salmeterol 100-50 MCG/DOSE Aepb Commonly known as:  ADVAIR Inhale 1 puff into the lungs 2 (two) times daily.   furosemide 40 MG tablet Commonly known as:  LASIX Take 1 tablet (40 mg total) by mouth daily.   glucose blood test strip Use to test blood sugars twice daily   Grape Seed 100 MG Caps Take 100 mg by mouth daily.   Insulin Pen Needle 32G X 4 MM Misc To use w/ Basaglar   ipratropium-albuterol 0.5-2.5 (3) MG/3ML Soln Commonly known as:  DUONEB 1 vial 4 times daily for shortness of breath   isosorbide mononitrate 30 MG 24 hr tablet Commonly known as:  IMDUR Take 0.5 tablets (15 mg total) by mouth daily.   lisinopril 2.5 MG tablet Commonly known as:  PRINIVIL,ZESTRIL Take 1 tablet (2.5 mg total) by mouth at bedtime.   loperamide 2 MG tablet Commonly known as:  IMODIUM A-D Take 2 mg by mouth 4 (four) times daily as needed for diarrhea or loose stools.   Melatonin 3 MG Tabs Take 2 tablets by mouth at bedtime.   metFORMIN 850 MG tablet Commonly known as:  GLUCOPHAGE Take 850 mg by mouth 2 (two) times daily with a meal.   nitroGLYCERIN 0.4 MG SL tablet Commonly known as:  NITROSTAT Place 1 tablet (0.4 mg total) under the tongue every 5 (five) minutes x 3 doses as needed for chest pain.   pantoprazole 40 MG tablet Commonly known as:  PROTONIX TAKE 1 TABLET DAILY. KEEP  OFFICE VISIT.   rosuvastatin 10 MG tablet Commonly  known as:  CRESTOR TAKE 1 TABLET DAILY.   SYSTANE BALANCE OP Place 1 drop into both eyes daily as needed (dry eyes).   tamsulosin 0.4 MG Caps capsule Commonly known as:  FLOMAX Take 1 capsule (0.4 mg total) by mouth daily after supper.   XIIDRA 5 % Soln Generic drug:  Lifitegrast Place 1 drop into the left eye 2 (two) times daily.           Objective:   Physical Exam BP 122/78 (BP Location: Left Arm, Patient Position: Sitting, Cuff Size: Small)   Pulse 67   Temp 97.7 F (36.5 C) (Oral)   Resp 16   Ht '5\' 6"'  (1.676 m)   Wt 169 lb 2 oz (76.7 kg)   SpO2 96%   BMI 27.30 kg/m  General:   Well developed, NAD, BMI noted. Looks more frail than before  HEENT:  Normocephalic . Face symmetric, atraumatic Lungs:  CTA B Normal respiratory effort, no intercostal retractions, no accessory muscle use. Heart: RRR,  no murmur.  No pretibial edema bilaterally  DRE prostate slt enlarged , particularly L lobe which is mildly-moderately tender Skin: Not pale. Not jaundice Neurologic:  alert , pleasently demented, not oriented to space, no recall his address or that Xmas is soon  Speech normal, gait less steady than before, uses a walker , needs help transferring  Psych--   Behavior appropriate. No anxious or depressed appearing.      Assessment & Plan:    Assessment Diabetes, no neuropathy as of 10-2015 10-2015: d/c Lantus but had to go back on it 10-2015 , dc Actos (dt CHF) and started metformin Hyperlipidemia Hypertension   Dementia MMSE 18 (11/2016) BPH  CV: --CAD, CABG 1997 --CHF:  Systolic, diastolic, EF echocardiogram 2014---45%.  Echo 02/2018: EF 25%  --Peripheral vascular disease Pulmonary -[facility]: On CPAP -asthma -  PFTs 06-2017: Obstructive and restrictive disease with + response to bronchodilators Hearing loss  [facility] f/u  Medication list reviewed: Since the last time I saw the patient: - Carvedilol added - Lexapro added - Isosorbide 30 mg increased to 1  tablet daily -Reportedly he stopped taking metformin - He is taking only 12 units of insulin. CAD, CHF: Status post non-STEMI, medication adjusted as above.  Seems to be doing well. Saw cardiology a couple of days ago, BMP satisfactory, he was felt to be stable. HTN: Seems controlled.  No change DM: On insulin, dose decreased from 17 units to 12 units.  Okay to restart metformin (was 850 mg 1.5 bid, will do 1 po BID). Dementia: No MMSE not done today but I think he is  slightly worse.  Reassess on RTC.  Stop Lexapro (started at SNF), no history of depression. BPH, prostatitis?: Long history of BPH, urinary symptoms have definitely increased since the hospital admission, having nocturia multiple times a night.  Recently had a Foley while he was in the hospital.  DRE suspicious for prostatitis. UA, urine culture, PSA.  Start empiric ciprofloxacin and Flomax.  Reassess in 3 weeks.  Last visit with urology was 05-2017.  Consider re-referral. RTC 3 weeks

## 2018-09-25 ENCOUNTER — Telehealth: Payer: Self-pay | Admitting: Internal Medicine

## 2018-09-25 LAB — URINALYSIS, ROUTINE W REFLEX MICROSCOPIC
BILIRUBIN URINE: NEGATIVE
Ketones, ur: NEGATIVE
Leukocytes, UA: NEGATIVE
NITRITE: POSITIVE — AB
PH: 6 (ref 5.0–8.0)
SPECIFIC GRAVITY, URINE: 1.015 (ref 1.000–1.030)
TOTAL PROTEIN, URINE-UPE24: 30 — AB
URINE GLUCOSE: NEGATIVE
UROBILINOGEN UA: 0.2 (ref 0.0–1.0)

## 2018-09-25 NOTE — Telephone Encounter (Signed)
Copied from CRM [id number]. Topic: Quick Communication - Home Health Verbal Orders >> Sep 25, 2018  1:24 PM Lorayne Bender wrote: Caller/Agency: Marcelino Duster with [facility] Callback Number: [phone], OK to leave a message Requesting OT/PT/Skilled Nursing/Social Work: nursing Frequency: 1 week 4, then 1x a month for 2 months

## 2018-09-25 NOTE — Assessment & Plan Note (Signed)
Hospital f/u  Medication list reviewed: Since the last time I saw the patient: - Carvedilol added - Lexapro added - Isosorbide 30 mg increased to 1 tablet daily -Reportedly he stopped taking metformin - He is taking only 12 units of insulin. CAD, CHF: Status post non-STEMI, medication adjusted as above.  Seems to be doing well. Saw cardiology a couple of days ago, BMP satisfactory, he was felt to be stable. HTN: Seems controlled.  No change DM: On insulin, dose decreased from 17 units to 12 units.  Okay to restart metformin (was 850 mg 1.5 bid, will do 1 po BID). Dementia: No MMSE not done today but I think he is  slightly worse.  Reassess on RTC.  Stop Lexapro (started at SNF), no history of depression. BPH, prostatitis?: Long history of BPH, urinary symptoms have definitely increased since the hospital admission, having nocturia multiple times a night.  Recently had a Foley while he was in the hospital.  DRE suspicious for prostatitis. UA, urine culture, PSA.  Start empiric ciprofloxacin and Flomax.  Reassess in 3 weeks.  Last visit with urology was 05-2017.  Consider re-referral. RTC 3 weeks

## 2018-09-26 ENCOUNTER — Other Ambulatory Visit (INDEPENDENT_AMBULATORY_CARE_PROVIDER_SITE_OTHER): Payer: Medicare Other

## 2018-09-26 ENCOUNTER — Telehealth: Payer: Self-pay | Admitting: Internal Medicine

## 2018-09-26 ENCOUNTER — Encounter: Payer: Self-pay | Admitting: Internal Medicine

## 2018-09-26 DIAGNOSIS — N419 Inflammatory disease of prostate, unspecified: Secondary | ICD-10-CM

## 2018-09-26 LAB — PSA: PSA: 6.95 ng/mL — ABNORMAL HIGH (ref 0.10–4.00)

## 2018-09-26 LAB — URINE CULTURE
MICRO NUMBER:: 91483983
SPECIMEN QUALITY:: ADEQUATE

## 2018-09-26 NOTE — Telephone Encounter (Signed)
Spoke w/ Michelle, verbal orders given.  

## 2018-09-26 NOTE — Telephone Encounter (Signed)
Urine culture came back showing E. coli. I spoke with the patient's wife, he is tolerating ciprofloxacin well, still have some urinary frequency.  She also sent a message regards urgency/ frequency  to defecate. No nausea, vomiting or blood in the stools. Last bowel movement was yesterday, he is not constipated. Stools are somewhat loose. We agreed on observation for now.  If he has more problems she will let me know, also if he gets diarrhea (at risk of C. difficile due to antibiotics)

## 2018-09-29 ENCOUNTER — Other Ambulatory Visit: Payer: Self-pay

## 2018-09-29 ENCOUNTER — Telehealth: Payer: Self-pay | Admitting: Internal Medicine

## 2018-09-29 DIAGNOSIS — Z951 Presence of aortocoronary bypass graft: Secondary | ICD-10-CM

## 2018-09-29 DIAGNOSIS — R079 Chest pain, unspecified: Secondary | ICD-10-CM

## 2018-09-29 DIAGNOSIS — E785 Hyperlipidemia, unspecified: Secondary | ICD-10-CM

## 2018-09-29 MED ORDER — ISOSORBIDE MONONITRATE ER 30 MG PO TB24: 15.0000 mg | ORAL_TABLET | Freq: Every day | ORAL | 3 refills | Status: DC

## 2018-09-29 NOTE — Telephone Encounter (Signed)
Spoke w/ Vinnie Langton, verbal orders given.

## 2018-09-29 NOTE — Telephone Encounter (Signed)
Copied from CRM [id number]. Topic: Quick Communication - Home Health Verbal Orders >> Sep 29, 2018  3:39 PM Angela Nevin wrote: Caller/Agency: Dorthey Sawyer [facility] Callback Number: [phone] Requesting OT/PT/Skilled Nursing/Social Work: PT  Frequency: 1w3 2w3   VM OK

## 2018-10-01 ENCOUNTER — Telehealth: Payer: Self-pay | Admitting: *Deleted

## 2018-10-01 ENCOUNTER — Encounter: Payer: Self-pay | Admitting: Internal Medicine

## 2018-10-01 DIAGNOSIS — Z951 Presence of aortocoronary bypass graft: Secondary | ICD-10-CM

## 2018-10-01 DIAGNOSIS — N390 Urinary tract infection, site not specified: Secondary | ICD-10-CM

## 2018-10-01 DIAGNOSIS — I472 Ventricular tachycardia: Secondary | ICD-10-CM

## 2018-10-01 DIAGNOSIS — Z7982 Long term (current) use of aspirin: Secondary | ICD-10-CM

## 2018-10-01 DIAGNOSIS — Z8507 Personal history of malignant neoplasm of pancreas: Secondary | ICD-10-CM

## 2018-10-01 DIAGNOSIS — R338 Other retention of urine: Secondary | ICD-10-CM

## 2018-10-01 DIAGNOSIS — I2510 Atherosclerotic heart disease of native coronary artery without angina pectoris: Secondary | ICD-10-CM

## 2018-10-01 DIAGNOSIS — I110 Hypertensive heart disease with heart failure: Secondary | ICD-10-CM | POA: Diagnosis not present

## 2018-10-01 DIAGNOSIS — J45909 Unspecified asthma, uncomplicated: Secondary | ICD-10-CM

## 2018-10-01 DIAGNOSIS — G4730 Sleep apnea, unspecified: Secondary | ICD-10-CM

## 2018-10-01 DIAGNOSIS — I214 Non-ST elevation (NSTEMI) myocardial infarction: Secondary | ICD-10-CM | POA: Diagnosis not present

## 2018-10-01 DIAGNOSIS — N401 Enlarged prostate with lower urinary tract symptoms: Secondary | ICD-10-CM

## 2018-10-01 DIAGNOSIS — F0390 Unspecified dementia without behavioral disturbance: Secondary | ICD-10-CM

## 2018-10-01 DIAGNOSIS — Z7984 Long term (current) use of oral hypoglycemic drugs: Secondary | ICD-10-CM

## 2018-10-01 DIAGNOSIS — I5043 Acute on chronic combined systolic (congestive) and diastolic (congestive) heart failure: Secondary | ICD-10-CM | POA: Diagnosis not present

## 2018-10-01 DIAGNOSIS — E785 Hyperlipidemia, unspecified: Secondary | ICD-10-CM

## 2018-10-01 DIAGNOSIS — E119 Type 2 diabetes mellitus without complications: Secondary | ICD-10-CM

## 2018-10-01 DIAGNOSIS — Z7902 Long term (current) use of antithrombotics/antiplatelets: Secondary | ICD-10-CM

## 2018-10-01 NOTE — Telephone Encounter (Signed)
Received Home Health Certification and Plan of Care; forwarded to provider/SLS 12/18  

## 2018-10-02 NOTE — Telephone Encounter (Signed)
Plan of care signed and faxed to [facility] at [phone]. Form sent for scanning.

## 2018-10-05 ENCOUNTER — Encounter: Payer: Self-pay | Admitting: Internal Medicine

## 2018-10-06 MED ORDER — FINASTERIDE 5 MG PO TABS: 5.0000 mg | ORAL_TABLET | Freq: Every day | ORAL | 0 refills | Status: DC

## 2018-10-09 ENCOUNTER — Emergency Department (HOSPITAL_COMMUNITY): Payer: Medicare Other

## 2018-10-09 ENCOUNTER — Encounter (HOSPITAL_COMMUNITY): Payer: Self-pay | Admitting: *Deleted

## 2018-10-09 ENCOUNTER — Other Ambulatory Visit: Payer: Self-pay

## 2018-10-09 ENCOUNTER — Inpatient Hospital Stay (HOSPITAL_COMMUNITY): Payer: Medicare Other

## 2018-10-09 ENCOUNTER — Inpatient Hospital Stay (HOSPITAL_COMMUNITY)
Admission: EM | Admit: 2018-10-09 | Discharge: 2018-10-14 | DRG: 193 | Disposition: A | Discharge status: Skilled Nursing Facility | Payer: Medicare Other | Attending: Internal Medicine | Admitting: Internal Medicine

## 2018-10-09 DIAGNOSIS — E876 Hypokalemia: Secondary | ICD-10-CM | POA: Diagnosis not present

## 2018-10-09 DIAGNOSIS — E785 Hyperlipidemia, unspecified: Secondary | ICD-10-CM | POA: Diagnosis present

## 2018-10-09 DIAGNOSIS — E118 Type 2 diabetes mellitus with unspecified complications: Secondary | ICD-10-CM

## 2018-10-09 DIAGNOSIS — Y95 Nosocomial condition: Secondary | ICD-10-CM | POA: Diagnosis present

## 2018-10-09 DIAGNOSIS — I10 Essential (primary) hypertension: Secondary | ICD-10-CM | POA: Diagnosis not present

## 2018-10-09 DIAGNOSIS — F0150 Vascular dementia without behavioral disturbance: Secondary | ICD-10-CM | POA: Diagnosis not present

## 2018-10-09 DIAGNOSIS — I428 Other cardiomyopathies: Secondary | ICD-10-CM | POA: Diagnosis present

## 2018-10-09 DIAGNOSIS — I252 Old myocardial infarction: Secondary | ICD-10-CM | POA: Diagnosis not present

## 2018-10-09 DIAGNOSIS — I2721 Secondary pulmonary arterial hypertension: Secondary | ICD-10-CM | POA: Diagnosis present

## 2018-10-09 DIAGNOSIS — I2510 Atherosclerotic heart disease of native coronary artery without angina pectoris: Secondary | ICD-10-CM | POA: Diagnosis not present

## 2018-10-09 DIAGNOSIS — G9341 Metabolic encephalopathy: Secondary | ICD-10-CM | POA: Diagnosis not present

## 2018-10-09 DIAGNOSIS — Z9049 Acquired absence of other specified parts of digestive tract: Secondary | ICD-10-CM

## 2018-10-09 DIAGNOSIS — Z794 Long term (current) use of insulin: Secondary | ICD-10-CM | POA: Diagnosis not present

## 2018-10-09 DIAGNOSIS — K219 Gastro-esophageal reflux disease without esophagitis: Secondary | ICD-10-CM | POA: Diagnosis present

## 2018-10-09 DIAGNOSIS — E7800 Pure hypercholesterolemia, unspecified: Secondary | ICD-10-CM | POA: Diagnosis present

## 2018-10-09 DIAGNOSIS — I25810 Atherosclerosis of coronary artery bypass graft(s) without angina pectoris: Secondary | ICD-10-CM | POA: Diagnosis not present

## 2018-10-09 DIAGNOSIS — Z7982 Long term (current) use of aspirin: Secondary | ICD-10-CM

## 2018-10-09 DIAGNOSIS — Z888 Allergy status to other drugs, medicaments and biological substances status: Secondary | ICD-10-CM

## 2018-10-09 DIAGNOSIS — Z7902 Long term (current) use of antithrombotics/antiplatelets: Secondary | ICD-10-CM

## 2018-10-09 DIAGNOSIS — Z66 Do not resuscitate: Secondary | ICD-10-CM | POA: Diagnosis present

## 2018-10-09 DIAGNOSIS — F05 Delirium due to known physiological condition: Secondary | ICD-10-CM | POA: Diagnosis present

## 2018-10-09 DIAGNOSIS — J181 Lobar pneumonia, unspecified organism: Secondary | ICD-10-CM | POA: Diagnosis present

## 2018-10-09 DIAGNOSIS — I5042 Chronic combined systolic (congestive) and diastolic (congestive) heart failure: Secondary | ICD-10-CM | POA: Diagnosis not present

## 2018-10-09 DIAGNOSIS — N182 Chronic kidney disease, stage 2 (mild): Secondary | ICD-10-CM | POA: Diagnosis present

## 2018-10-09 DIAGNOSIS — J9601 Acute respiratory failure with hypoxia: Secondary | ICD-10-CM | POA: Diagnosis not present

## 2018-10-09 DIAGNOSIS — Z955 Presence of coronary angioplasty implant and graft: Secondary | ICD-10-CM

## 2018-10-09 DIAGNOSIS — N179 Acute kidney failure, unspecified: Secondary | ICD-10-CM | POA: Diagnosis not present

## 2018-10-09 DIAGNOSIS — I959 Hypotension, unspecified: Secondary | ICD-10-CM | POA: Diagnosis present

## 2018-10-09 DIAGNOSIS — N400 Enlarged prostate without lower urinary tract symptoms: Secondary | ICD-10-CM | POA: Diagnosis present

## 2018-10-09 DIAGNOSIS — IMO0001 Reserved for inherently not codable concepts without codable children: Secondary | ICD-10-CM

## 2018-10-09 DIAGNOSIS — J189 Pneumonia, unspecified organism: Secondary | ICD-10-CM | POA: Diagnosis not present

## 2018-10-09 DIAGNOSIS — Z8042 Family history of malignant neoplasm of prostate: Secondary | ICD-10-CM

## 2018-10-09 DIAGNOSIS — I5043 Acute on chronic combined systolic (congestive) and diastolic (congestive) heart failure: Secondary | ICD-10-CM | POA: Diagnosis present

## 2018-10-09 DIAGNOSIS — Z1624 Resistance to multiple antibiotics: Secondary | ICD-10-CM | POA: Diagnosis not present

## 2018-10-09 DIAGNOSIS — Z951 Presence of aortocoronary bypass graft: Secondary | ICD-10-CM

## 2018-10-09 DIAGNOSIS — G4730 Sleep apnea, unspecified: Secondary | ICD-10-CM | POA: Diagnosis present

## 2018-10-09 DIAGNOSIS — F0390 Unspecified dementia without behavioral disturbance: Secondary | ICD-10-CM | POA: Diagnosis present

## 2018-10-09 DIAGNOSIS — R531 Weakness: Secondary | ICD-10-CM | POA: Diagnosis present

## 2018-10-09 DIAGNOSIS — I472 Ventricular tachycardia: Secondary | ICD-10-CM | POA: Diagnosis present

## 2018-10-09 DIAGNOSIS — E1122 Type 2 diabetes mellitus with diabetic chronic kidney disease: Secondary | ICD-10-CM | POA: Diagnosis present

## 2018-10-09 DIAGNOSIS — J13 Pneumonia due to Streptococcus pneumoniae: Secondary | ICD-10-CM | POA: Diagnosis not present

## 2018-10-09 DIAGNOSIS — I739 Peripheral vascular disease, unspecified: Secondary | ICD-10-CM

## 2018-10-09 DIAGNOSIS — Z79899 Other long term (current) drug therapy: Secondary | ICD-10-CM

## 2018-10-09 DIAGNOSIS — G4733 Obstructive sleep apnea (adult) (pediatric): Secondary | ICD-10-CM | POA: Diagnosis present

## 2018-10-09 DIAGNOSIS — I361 Nonrheumatic tricuspid (valve) insufficiency: Secondary | ICD-10-CM

## 2018-10-09 DIAGNOSIS — N401 Enlarged prostate with lower urinary tract symptoms: Secondary | ICD-10-CM | POA: Diagnosis present

## 2018-10-09 DIAGNOSIS — Z8719 Personal history of other diseases of the digestive system: Secondary | ICD-10-CM

## 2018-10-09 DIAGNOSIS — Z9119 Patient's noncompliance with other medical treatment and regimen: Secondary | ICD-10-CM

## 2018-10-09 DIAGNOSIS — Z7951 Long term (current) use of inhaled steroids: Secondary | ICD-10-CM

## 2018-10-09 DIAGNOSIS — I779 Disorder of arteries and arterioles, unspecified: Secondary | ICD-10-CM | POA: Diagnosis present

## 2018-10-09 DIAGNOSIS — I130 Hypertensive heart and chronic kidney disease with heart failure and stage 1 through stage 4 chronic kidney disease, or unspecified chronic kidney disease: Secondary | ICD-10-CM | POA: Diagnosis not present

## 2018-10-09 DIAGNOSIS — E1151 Type 2 diabetes mellitus with diabetic peripheral angiopathy without gangrene: Secondary | ICD-10-CM | POA: Diagnosis present

## 2018-10-09 HISTORY — DX: Chronic combined systolic (congestive) and diastolic (congestive) heart failure: I50.42

## 2018-10-09 HISTORY — DX: Thoracic aortic ectasia: I77.810

## 2018-10-09 HISTORY — DX: Unspecified dementia, unspecified severity, without behavioral disturbance, psychotic disturbance, mood disturbance, and anxiety: F03.90

## 2018-10-09 HISTORY — DX: Syncope and collapse: R55

## 2018-10-09 HISTORY — DX: Chronic kidney disease, stage 2 (mild): N18.2

## 2018-10-09 HISTORY — DX: Pulmonary hypertension, unspecified: I27.20

## 2018-10-09 LAB — CBC WITH DIFFERENTIAL/PLATELET
Abs Immature Granulocytes: 0.2 10*3/uL — ABNORMAL HIGH (ref 0.00–0.07)
Band Neutrophils: 30 %
Basophils Absolute: 0 10*3/uL (ref 0.0–0.1)
Basophils Relative: 0 %
Eosinophils Absolute: 0 10*3/uL (ref 0.0–0.5)
Eosinophils Relative: 0 %
HCT: 35.3 % — ABNORMAL LOW (ref 39.0–52.0)
Hemoglobin: 11.8 g/dL — ABNORMAL LOW (ref 13.0–17.0)
Lymphocytes Relative: 4 %
Lymphs Abs: 0.8 10*3/uL (ref 0.7–4.0)
MCH: 29.8 pg (ref 26.0–34.0)
MCHC: 33.4 g/dL (ref 30.0–36.0)
MCV: 89.1 fL (ref 80.0–100.0)
MONO ABS: 1 10*3/uL (ref 0.1–1.0)
Monocytes Relative: 5 %
Neutro Abs: 18.1 10*3/uL — ABNORMAL HIGH (ref 1.7–7.7)
Neutrophils Relative %: 60 %
PROMYELOCYTES RELATIVE: 1 %
Platelets: 186 10*3/uL (ref 150–400)
RBC: 3.96 MIL/uL — ABNORMAL LOW (ref 4.22–5.81)
RDW: 14.2 % (ref 11.5–15.5)
WBC: 20.1 10*3/uL — ABNORMAL HIGH (ref 4.0–10.5)
nRBC: 0 % (ref 0.0–0.2)
nRBC: 0 /100 WBC

## 2018-10-09 LAB — INFLUENZA PANEL BY PCR (TYPE A & B)
Influenza A By PCR: NEGATIVE
Influenza B By PCR: NEGATIVE

## 2018-10-09 LAB — COMPREHENSIVE METABOLIC PANEL
ALT: 22 U/L (ref 0–44)
AST: 31 U/L (ref 15–41)
Albumin: 3.7 g/dL (ref 3.5–5.0)
Alkaline Phosphatase: 48 U/L (ref 38–126)
Anion gap: 12 (ref 5–15)
BUN: 18 mg/dL (ref 8–23)
CO2: 27 mmol/L (ref 22–32)
Calcium: 8.9 mg/dL (ref 8.9–10.3)
Chloride: 95 mmol/L — ABNORMAL LOW (ref 98–111)
Creatinine, Ser: 1.08 mg/dL (ref 0.61–1.24)
GFR calc Af Amer: >60 mL/min (ref >60)
GFR calc non Af Amer: >60 mL/min (ref >60)
GLUCOSE: 103 mg/dL — AB (ref 70–99)
Potassium: 3.3 mmol/L — ABNORMAL LOW (ref 3.5–5.1)
Sodium: 134 mmol/L — ABNORMAL LOW (ref 135–145)
Total Bilirubin: 1.4 mg/dL — ABNORMAL HIGH (ref 0.3–1.2)
Total Protein: 6.4 g/dL — ABNORMAL LOW (ref 6.5–8.1)

## 2018-10-09 LAB — URINALYSIS, COMPLETE (UACMP) WITH MICROSCOPIC
BILIRUBIN URINE: NEGATIVE
Glucose, UA: 50 mg/dL — AB
Ketones, ur: NEGATIVE mg/dL
Leukocytes, UA: NEGATIVE
Nitrite: NEGATIVE
PH: 5 (ref 5.0–8.0)
Protein, ur: 30 mg/dL — AB
Specific Gravity, Urine: 1.019 (ref 1.005–1.030)

## 2018-10-09 LAB — PROCALCITONIN: Procalcitonin: 15.91 ng/mL

## 2018-10-09 LAB — BRAIN NATRIURETIC PEPTIDE: B Natriuretic Peptide: >4500 pg/mL — ABNORMAL HIGH (ref 0.0–100.0)

## 2018-10-09 LAB — EXPECTORATED SPUTUM ASSESSMENT W REFEX TO RESP CULTURE

## 2018-10-09 LAB — I-STAT CG4 LACTIC ACID, ED: Lactic Acid, Venous: 1.95 mmol/L — ABNORMAL HIGH (ref 0.5–1.9)

## 2018-10-09 LAB — LACTIC ACID, PLASMA
Lactic Acid, Venous: 2.9 mmol/L (ref 0.5–1.9)
Lactic Acid, Venous: 2.7 mmol/L (ref 0.5–1.9)

## 2018-10-09 LAB — STREP PNEUMONIAE URINARY ANTIGEN: Strep Pneumo Urinary Antigen: POSITIVE — AB

## 2018-10-09 LAB — CBG MONITORING, ED: Glucose-Capillary: 94 mg/dL (ref 70–99)

## 2018-10-09 LAB — ECHOCARDIOGRAM COMPLETE

## 2018-10-09 LAB — EXPECTORATED SPUTUM ASSESSMENT W GRAM STAIN, RFLX TO RESP C

## 2018-10-09 MED ORDER — ONDANSETRON HCL 4 MG/2ML IJ SOLN: 4.0000 mg | Freq: Four times a day (QID) | INTRAMUSCULAR | Status: DC | PRN

## 2018-10-09 MED ORDER — SODIUM CHLORIDE 0.9% FLUSH: 3.0000 mL | INTRAVENOUS | Status: DC | PRN

## 2018-10-09 MED ORDER — SODIUM CHLORIDE 0.9 % IV SOLN
250.0000 mL | INTRAVENOUS | Status: DC | PRN
  Administered 2018-10-09 – 2018-10-13 (×2): 250 mL via INTRAVENOUS

## 2018-10-09 MED ORDER — PREDNISONE 20 MG PO TABS
20.0000 mg | ORAL_TABLET | Freq: Every day | ORAL | Status: DC
  Administered 2018-10-09 – 2018-10-10 (×2): 20 mg via ORAL
  Filled 2018-10-09 (×2): qty 1

## 2018-10-09 MED ORDER — PANTOPRAZOLE SODIUM 40 MG PO TBEC
40.0000 mg | DELAYED_RELEASE_TABLET | Freq: Every day | ORAL | Status: DC
  Administered 2018-10-10 – 2018-10-14 (×5): 40 mg via ORAL
  Filled 2018-10-09 (×5): qty 1

## 2018-10-09 MED ORDER — SODIUM CHLORIDE 0.9 % IV SOLN
1.0000 g | Freq: Three times a day (TID) | INTRAVENOUS | Status: DC
  Filled 2018-10-09 (×2): qty 1

## 2018-10-09 MED ORDER — SODIUM CHLORIDE 0.9 % IV SOLN
1.0000 g | Freq: Once | INTRAVENOUS | Status: AC
  Administered 2018-10-09: 1 g via INTRAVENOUS
  Filled 2018-10-09: qty 10

## 2018-10-09 MED ORDER — TAMSULOSIN HCL 0.4 MG PO CAPS
0.4000 mg | ORAL_CAPSULE | Freq: Every day | ORAL | Status: DC
  Administered 2018-10-09 – 2018-10-13 (×5): 0.4 mg via ORAL
  Filled 2018-10-09 (×5): qty 1

## 2018-10-09 MED ORDER — ENOXAPARIN SODIUM 40 MG/0.4ML ~~LOC~~ SOLN
40.0000 mg | SUBCUTANEOUS | Status: DC
  Administered 2018-10-09 – 2018-10-13 (×5): 40 mg via SUBCUTANEOUS
  Filled 2018-10-09 (×5): qty 0.4

## 2018-10-09 MED ORDER — SODIUM CHLORIDE 0.9 % IV SOLN
INTRAVENOUS | Status: DC
  Administered 2018-10-09: 11:00:00 via INTRAVENOUS

## 2018-10-09 MED ORDER — ALBUTEROL SULFATE (2.5 MG/3ML) 0.083% IN NEBU
2.5000 mg | INHALATION_SOLUTION | RESPIRATORY_TRACT | Status: DC | PRN
  Administered 2018-10-11: 2.5 mg via RESPIRATORY_TRACT
  Filled 2018-10-09: qty 3

## 2018-10-09 MED ORDER — VANCOMYCIN HCL IN DEXTROSE 1-5 GM/200ML-% IV SOLN
1000.0000 mg | INTRAVENOUS | Status: DC
  Filled 2018-10-09: qty 200

## 2018-10-09 MED ORDER — CARVEDILOL 6.25 MG PO TABS: 6.2500 mg | ORAL_TABLET | Freq: Two times a day (BID) | ORAL | Status: DC

## 2018-10-09 MED ORDER — MELATONIN 3 MG PO TABS
2.0000 | ORAL_TABLET | Freq: Every day | ORAL | Status: DC
  Administered 2018-10-09 – 2018-10-13 (×5): 6 mg via ORAL
  Filled 2018-10-09 (×5): qty 2

## 2018-10-09 MED ORDER — LISINOPRIL 5 MG PO TABS
2.5000 mg | ORAL_TABLET | Freq: Every day | ORAL | Status: DC
  Administered 2018-10-09: 2.5 mg via ORAL
  Filled 2018-10-09: qty 1

## 2018-10-09 MED ORDER — ROSUVASTATIN CALCIUM 5 MG PO TABS
10.0000 mg | ORAL_TABLET | Freq: Every day | ORAL | Status: DC
  Administered 2018-10-09 – 2018-10-13 (×5): 10 mg via ORAL
  Filled 2018-10-09 (×5): qty 2

## 2018-10-09 MED ORDER — BASAGLAR KWIKPEN 100 UNIT/ML ~~LOC~~ SOPN: 12.0000 [IU] | PEN_INJECTOR | Freq: Every day | SUBCUTANEOUS | Status: DC

## 2018-10-09 MED ORDER — MOMETASONE FURO-FORMOTEROL FUM 100-5 MCG/ACT IN AERO
2.0000 | INHALATION_SPRAY | Freq: Two times a day (BID) | RESPIRATORY_TRACT | Status: DC
  Administered 2018-10-10 – 2018-10-13 (×6): 2 via RESPIRATORY_TRACT
  Filled 2018-10-09 (×4): qty 8.8

## 2018-10-09 MED ORDER — POTASSIUM CHLORIDE CRYS ER 20 MEQ PO TBCR
40.0000 meq | EXTENDED_RELEASE_TABLET | Freq: Once | ORAL | Status: AC
  Administered 2018-10-09: 40 meq via ORAL
  Filled 2018-10-09: qty 2

## 2018-10-09 MED ORDER — ASPIRIN EC 81 MG PO TBEC
162.0000 mg | DELAYED_RELEASE_TABLET | Freq: Every day | ORAL | Status: DC
  Administered 2018-10-09 – 2018-10-11 (×3): 162 mg via ORAL
  Filled 2018-10-09 (×3): qty 2

## 2018-10-09 MED ORDER — INSULIN GLARGINE 100 UNIT/ML ~~LOC~~ SOLN
12.0000 [IU] | Freq: Every day | SUBCUTANEOUS | Status: DC
  Administered 2018-10-09 – 2018-10-11 (×3): 12 [IU] via SUBCUTANEOUS
  Filled 2018-10-09 (×4): qty 0.12

## 2018-10-09 MED ORDER — CLOPIDOGREL BISULFATE 75 MG PO TABS
75.0000 mg | ORAL_TABLET | Freq: Every day | ORAL | Status: DC
  Administered 2018-10-09 – 2018-10-14 (×6): 75 mg via ORAL
  Filled 2018-10-09 (×6): qty 1

## 2018-10-09 MED ORDER — SODIUM CHLORIDE 0.9 % IV SOLN
500.0000 mg | Freq: Once | INTRAVENOUS | Status: AC
  Administered 2018-10-09: 500 mg via INTRAVENOUS
  Filled 2018-10-09: qty 500

## 2018-10-09 MED ORDER — SODIUM CHLORIDE 0.9% FLUSH
3.0000 mL | Freq: Two times a day (BID) | INTRAVENOUS | Status: DC
  Administered 2018-10-09 – 2018-10-14 (×10): 3 mL via INTRAVENOUS

## 2018-10-09 MED ORDER — ACETAMINOPHEN 325 MG PO TABS: 650.0000 mg | ORAL_TABLET | ORAL | Status: DC | PRN

## 2018-10-09 MED ORDER — VANCOMYCIN HCL 10 G IV SOLR
1500.0000 mg | Freq: Once | INTRAVENOUS | Status: AC
  Administered 2018-10-09: 1500 mg via INTRAVENOUS
  Filled 2018-10-09: qty 1500

## 2018-10-09 MED ORDER — FUROSEMIDE 10 MG/ML IJ SOLN
40.0000 mg | Freq: Two times a day (BID) | INTRAMUSCULAR | Status: DC
  Administered 2018-10-09 – 2018-10-12 (×6): 40 mg via INTRAVENOUS
  Filled 2018-10-09 (×6): qty 4

## 2018-10-09 MED ORDER — SODIUM CHLORIDE 0.9 % IV SOLN
1.0000 g | Freq: Two times a day (BID) | INTRAVENOUS | Status: DC
  Administered 2018-10-09 – 2018-10-10 (×2): 1 g via INTRAVENOUS
  Filled 2018-10-09 (×2): qty 1

## 2018-10-09 MED ORDER — FINASTERIDE 5 MG PO TABS
5.0000 mg | ORAL_TABLET | Freq: Every day | ORAL | Status: DC
  Administered 2018-10-09 – 2018-10-13 (×5): 5 mg via ORAL
  Filled 2018-10-09 (×5): qty 1

## 2018-10-09 MED ORDER — LIFITEGRAST 5 % OP SOLN: 1.0000 [drp] | Freq: Two times a day (BID) | OPHTHALMIC | Status: DC

## 2018-10-09 MED ORDER — SODIUM CHLORIDE 0.9 % IV BOLUS
500.0000 mL | Freq: Once | INTRAVENOUS | Status: AC
  Administered 2018-10-09: 500 mL via INTRAVENOUS

## 2018-10-09 MED ORDER — EZETIMIBE 10 MG PO TABS
10.0000 mg | ORAL_TABLET | Freq: Every day | ORAL | Status: DC
  Administered 2018-10-09 – 2018-10-14 (×6): 10 mg via ORAL
  Filled 2018-10-09 (×6): qty 1

## 2018-10-09 MED ORDER — ISOSORBIDE MONONITRATE ER 30 MG PO TB24
15.0000 mg | ORAL_TABLET | Freq: Every day | ORAL | Status: DC
  Administered 2018-10-09 – 2018-10-14 (×6): 15 mg via ORAL
  Filled 2018-10-09 (×6): qty 1

## 2018-10-09 NOTE — ED Notes (Signed)
Attempted report 

## 2018-10-09 NOTE — Progress Notes (Signed)
Patient refused CPAP for tonight. Per son patient does not wear CPAP at home.

## 2018-10-09 NOTE — Progress Notes (Signed)
Lactic acid 2.9, paged Dr Ophelia Charter, advised [facility] charge nurse

## 2018-10-09 NOTE — ED Notes (Signed)
Pt instructed to provide urine sample. Admitting MD at bedside.

## 2018-10-09 NOTE — Progress Notes (Signed)
  Echocardiogram 2D Echocardiogram has been performed.  Gerda Diss 10/09/2018, 3:23 PM

## 2018-10-09 NOTE — ED Notes (Signed)
Patient transported to CT 

## 2018-10-09 NOTE — Progress Notes (Signed)
Pt was received around 1450 from ED, report was received by ED nurse and told pt has 637cc per bladder scan and unable to void, she did not put in foley as she says they are not allowed to put in foley, advised Museum/gallery curator of report, this nurse was on lunch, pt was unable to void by receiving nurse Derick who was covering and then pt was getting bedside echocardiogram, pt again allowed to stand to void and still unable, pt reports feeling urge but he can not, wife reports he was treated for prostate issue few weeks ago,she also reports he needed a foley last month when he was here, will place foley per order, also telemetry called stating runs of vt, pt asx and deneis cp or sob, paged md as k was noted 3.3 on labs

## 2018-10-09 NOTE — ED Notes (Signed)
Patient transported to X-ray 

## 2018-10-09 NOTE — ED Provider Notes (Signed)
[facility] EMERGENCY DEPARTMENT Provider Note   CSN: [id number] Arrival date & time: 10/09/18  0930     History   Chief Complaint Chief Complaint  Patient presents with  . Altered Mental Status  . Shortness of Breath    HPI [name]. is a 84 y.o. male.  HPI Patient presents to the emergency room for evaluation of confusion.  Patient was hospitalized last month for a non-ST elevation myocardial infarction.  Patient ended up having a cardiac catheterization with splint placement of stents.  Patient was discharged from the hospital and was recovering well.  Patient started to have some urinary symptoms.  He was started on ciprofloxacin for possible prostatitis by his primary care physician Dr. Larose Kells on December 11.  However, in the past week the patient started having issues with cough and congestion.  He went to an urgent care on Monday.  He was diagnosed with acute laryngitis.  His symptoms persisted.  Family states that last night the patient became very confused.  He thought he was at home with his grandfather.  He also was not aware of who his family members were, specifically his grandson, and where he was.  He had a fall last evening but did not seem to injure himself.  This morning he was still confused.  Family members called EMS.  When EMS arrived his oxygen level was in the 80s.  They placed him on supplemental oxygen.  Patient grandson feels that the patient is improved since then.  He is more alert and awake and back to his usual mental state. Past Medical History:  Diagnosis Date  . Acute CHF ([place]) 09/15/2012  . Asthma    pt denies this hx on 11/02/2013  . BPH (benign prostatic hyperplasia)   . CAD (coronary artery disease)    CABG '97, SVG DES 12/13  . Colonic polyp   . Exertional shortness of breath   . GERD (gastroesophageal reflux disease)   . Hearing loss   . History of pancreatitis    pt denies this hx on 11/02/2013  . Hyperlipidemia   .  Hypertension   . Intrinsic asthma 06/18/2007   Qualifier: Diagnosis of  By: Dance CMA ([place]), Kim    . Ischemic cardiomyopathy 09/16/2012   EF 25%- improved to 45% 3/14  . Myocardial infarction ([place]) 1997  . NSTEMI (non-ST elevated myocardial infarction) ([place]) 09/06/2018  . Obstructed, uropathy   . PVD (peripheral vascular disease) ([facility])    moderate bilat carotid disease  . Syncope and collapse 11/02/2013   "didn't pass out" (11/02/2013)  . Type II diabetes mellitus [facility])     Patient Active Problem List   Diagnosis Date Noted  . Urinary retention 09/10/2018  . NSTEMI (non-ST elevated myocardial infarction) ([place]) 09/06/2018  . Dementia ([place]) 11/26/2016  . Essential hypertension 07/30/2016  . PCP NOTES >>>>>>>>> 11/28/2015  . Chronic combined systolic and diastolic CHF (congestive heart failure) ([place]) 07/08/2015  . Fatigue 06/01/2015  . Annual physical exam 08/04/2014  . Carotid artery disease ([place]) 12/25/2013  . Syncope 11/02/2013  . Left facial numbness 11/02/2013  . Sleep apnea- C-pap   . Ischemic cardiomyopathy 09/16/2012  . S/P angioplasty with stent  09/16/2012  . Insulin dependent diabetes mellitus with complications ([place]) 01/65/5374  . BENIGN POSITIONAL VERTIGO 03/09/2010  . 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 Dyslipidemia, goal LDL below 70 06/18/2007  . Hx of CABG 06/18/2007  . ALLERGIC RHINITIS 06/18/2007  . Asthma, chronic 06/18/2007  .  BPH (benign prostatic hyperplasia) 06/18/2007  . PANCREATITIS, HX OF 06/18/2007  . COLONIC POLYPS, HX OF 06/18/2007    Past Surgical History:  Procedure Laterality Date  . CATARACT EXTRACTION W/ INTRAOCULAR LENS  IMPLANT, BILATERAL Bilateral ~ 1970  . CHOLECYSTECTOMY  2000's  . CORONARY ANGIOPLASTY WITH STENT PLACEMENT  Dec 2013   DES to SVG-CFX/PDA  . CORONARY ARTERY BYPASS GRAFT  1997   "CABG X 5"  . CORONARY STENT INTERVENTION N/A 09/08/2018   Procedure: CORONARY STENT INTERVENTION;  Surgeon: Lorretta Harp, MD;   Location: [facility];  Service: Cardiovascular;  Laterality: N/A;  . INGUINAL HERNIA REPAIR Bilateral ~ 1950  . LEFT HEART CATH AND CORS/GRAFTS ANGIOGRAPHY N/A 09/08/2018   Procedure: LEFT HEART CATH AND CORS/GRAFTS ANGIOGRAPHY;  Surgeon: Lorretta Harp, MD;  Location: [facility];  Service: Cardiovascular;  Laterality: N/A;  . LEFT HEART CATHETERIZATION WITH CORONARY/GRAFT ANGIOGRAM N/A 09/16/2012   Procedure: LEFT HEART CATHETERIZATION WITH Beatrix Fetters;  Surgeon: Lorretta Harp, MD;  Location: [facility] C[facility] LAB;  Service: Cardiovascular;  Laterality: N/A;  . NASAL SEPTUM SURGERY  2007  . RETINAL DETACHMENT SURGERY Left ?1995  . SHOULDER OPEN ROTATOR CUFF REPAIR Right         Home Medications    Prior to Admission medications   Medication Sig Start Date End Date Taking? Authorizing Provider  acetaminophen (TYLENOL) 500 MG tablet Take 1,000 mg by mouth every 6 (six) hours as needed (for pain or headaches).    Yes [provider]  albuterol (PROVENTIL HFA;VENTOLIN HFA) 108 (90 Base) MCG/ACT inhaler Inhale 2 puffs into the lungs every 6 (six) hours as needed for wheezing or shortness of breath. 08/29/18  Yes Colon Branch, MD  aspirin EC 81 MG EC tablet Take 2 tablets (162 mg total) by mouth daily. 09/18/12  Yes Kilroy, Luke K, PA-C  carvedilol (COREG) 6.25 MG tablet Take 1 tablet (6.25 mg total) by mouth 2 (two) times daily with a meal. 09/10/18  Yes Kroeger, Daleen Snook M., PA-C  clopidogrel (PLAVIX) 75 MG tablet TAKE 1 TABLET DAILY WITH   BREAKFAST. KEEP OFFICE     VISIT. Patient taking differently: Take 75 mg by mouth daily.  06/30/18  Yes Lorretta Harp, MD  ezetimibe (ZETIA) 10 MG tablet TAKE 1 TABLET DAILY. KEEP  OFFICE VISIT Patient taking differently: Take 10 mg by mouth daily.  06/30/18  Yes Lorretta Harp, MD  finasteride (PROSCAR) 5 MG tablet Take 1 tablet (5 mg total) by mouth at bedtime. 10/06/18  Yes Paz, Alda Berthold, MD  Fluticasone-Salmeterol  (ADVAIR) 100-50 MCG/DOSE AEPB Inhale 1 puff into the lungs 2 (two) times daily. 08/09/17  Yes Paz, Alda Berthold, MD  furosemide (LASIX) 40 MG tablet Take 1 tablet (40 mg total) by mouth daily. 09/22/18  Yes Kilroy, Luke K, PA-C  Grape Seed 100 MG CAPS Take 100 mg by mouth daily.    Yes [provider]  Insulin Glargine (BASAGLAR KWIKPEN) 100 UNIT/ML SOPN Inject 0.12 mLs (12 Units total) into the skin at bedtime. 09/24/18  Yes Paz, Alda Berthold, MD  isosorbide mononitrate (IMDUR) 30 MG 24 hr tablet Take 0.5 tablets (15 mg total) by mouth daily. 09/29/18  Yes Lorretta Harp, MD  lisinopril (PRINIVIL,ZESTRIL) 2.5 MG tablet Take 1 tablet (2.5 mg total) by mouth at bedtime. 09/22/18  Yes Kilroy, Luke K, PA-C  loperamide (IMODIUM A-D) 2 MG tablet Take 2 mg by mouth 4 (four) times daily as needed  for diarrhea or loose stools.   Yes [provider]  Melatonin 3 MG TABS Take 2 tablets by mouth at bedtime.   Yes [provider]  metFORMIN (GLUCOPHAGE) 850 MG tablet Take 850 mg by mouth 2 (two) times daily with a meal.   Yes [provider]  nitroGLYCERIN (NITROSTAT) 0.4 MG SL tablet Place 1 tablet (0.4 mg total) under the tongue every 5 (five) minutes x 3 doses as needed for chest pain. 02/04/18  Yes Paz, Alda Berthold, MD  pantoprazole (PROTONIX) 40 MG tablet TAKE 1 TABLET DAILY. KEEP  OFFICE VISIT. Patient taking differently: Take 40 mg by mouth daily before breakfast.  06/30/18  Yes Lorretta Harp, MD  rosuvastatin (CRESTOR) 10 MG tablet TAKE 1 TABLET DAILY. Patient taking differently: Take 10 mg by mouth daily.  06/30/18  Yes Lorretta Harp, MD  tamsulosin (FLOMAX) 0.4 MG CAPS capsule Take 1 capsule (0.4 mg total) by mouth daily after supper. 09/24/18  Yes Paz, Alda Berthold, MD  XIIDRA 5 % SOLN Place 1 drop into the left eye 2 (two) times daily. 08/27/18  Yes [provider]  Alcohol Swabs (ALCOHOL PREP) 70 % PADS Reported on 04/30/2016 02/23/14   [provider]  Blood  Glucose Monitoring Suppl (BLOOD GLUCOSE METER) kit Use as instructed 04/08/13   Norins, Heinz Knuckles, MD  ciprofloxacin (CIPRO) 250 MG tablet Take 1 tablet (250 mg total) by mouth 2 (two) times daily. 09/24/18   Colon Branch, MD  glucose blood test strip Use to test blood sugars twice daily 02/01/14   Norins, Heinz Knuckles, MD  Insulin Pen Needle 32G X 4 MM MISC To use w/ Basaglar 11/29/15   Colon Branch, MD  Lancet Devices (ADJUSTABLE LANCING DEVICE) MISC Reported on 04/30/2016 02/23/14   [provider]    Family History Family History  Problem Relation Age of Onset  . Prostate cancer Brother        dx ~ 3 y/o  . Colon cancer Neg Hx     Social History Social History   Tobacco Use  . Smoking status: Never Smoker  . Smokeless tobacco: Never Used  . Tobacco comment: 11/02/2013 "smoked cigars when I was 18 or so; didn't smoke many"  Substance Use Topics  . Alcohol use: Yes    Comment: Occasional  . Drug use: No     Allergies   Ativan [lorazepam]; Colesevelam; and Ezetimibe-simvastatin   Review of Systems Review of Systems  All other systems reviewed and are negative.    Physical Exam Updated Vital Signs BP (!) 111/44   Pulse (!) 39   Temp 98.3 F (36.8 C) (Oral)   Resp (!) 23   SpO2 96%   Physical Exam Vitals signs and nursing note reviewed.  Constitutional:      General: He is not in acute distress.    Appearance: He is well-developed.  HENT:     Head: Normocephalic and atraumatic.     Right Ear: External ear normal.     Left Ear: External ear normal.  Eyes:     General: No scleral icterus.       Right eye: No discharge.        Left eye: No discharge.     Conjunctiva/sclera: Conjunctivae normal.  Neck:     Musculoskeletal: Neck supple.     Trachea: No tracheal deviation.  Cardiovascular:     Rate and Rhythm: Normal rate and regular rhythm.  Pulmonary:  Effort: Pulmonary effort is normal. No respiratory distress.     Breath sounds: No stridor. Wheezing  and rhonchi present. No rales.  Abdominal:     General: Bowel sounds are normal. There is no distension.     Palpations: Abdomen is soft.     Tenderness: There is no abdominal tenderness. There is no guarding or rebound.  Musculoskeletal:        General: No tenderness.  Skin:    General: Skin is warm and dry.     Findings: No rash.  Neurological:     Mental Status: He is alert.     Cranial Nerves: No cranial nerve deficit (no facial droop, extraocular movements intact, no slurred speech).     Sensory: No sensory deficit.     Motor: No abnormal muscle tone or seizure activity.     Coordination: Coordination normal.     Comments: Patient is aware of his location and also recognizes his family members      ED Treatments / Results  Labs (all labs ordered are listed, but only abnormal results are displayed) Labs Reviewed  COMPREHENSIVE METABOLIC PANEL - Abnormal; Notable for the following components:      Result Value   Sodium 134 (*)    Potassium 3.3 (*)    Chloride 95 (*)    Glucose, Bld 103 (*)    Total Protein 6.4 (*)    Total Bilirubin 1.4 (*)    All other components within normal limits  CBC WITH DIFFERENTIAL/PLATELET - Abnormal; Notable for the following components:   WBC 20.1 (*)    RBC 3.96 (*)    Hemoglobin 11.8 (*)    HCT 35.3 (*)    Neutro Abs 18.1 (*)    Abs Immature Granulocytes 0.20 (*)    All other components within normal limits  BRAIN NATRIURETIC PEPTIDE - Abnormal; Notable for the following components:   B Natriuretic Peptide >4,500.0 (*)    All other components within normal limits  URINALYSIS, COMPLETE (UACMP) WITH MICROSCOPIC  INFLUENZA PANEL BY PCR (TYPE A & B)  CBG MONITORING, ED  I-STAT CG4 LACTIC ACID, ED    EKG EKG Interpretation  Date/Time:  Thursday October 09 2018 09:42:15 EST Ventricular Rate:  44 PR Interval:    QRS Duration: 280 QT Interval:  672 QTC Calculation: 575 R Axis:   22 Text Interpretation:  Sinus bradycardia LVH with  secondary repolarization abnormality Anterior Q waves, possibly due to LVH Prolonged QT interval pvc, bigeminy, prolonged qt and pvc are new since last tracing Baseline wander in lead(s) V1 Confirmed by Dorie Rank [id number]) on 10/09/2018 9:55:32 AM   Radiology Dg Chest 2 View  Result Date: 10/09/2018 CLINICAL DATA:  Congestion, cough, shortness of breath EXAM: CHEST - 2 VIEW COMPARISON:  Portable chest x-ray of 09/06/2017 FINDINGS: There is opacity posteriorly at the medial left lung base consistent with left lower lobe pneumonia. A small left pleural effusion cannot be excluded. The right lung appears clear. Cardiomegaly is stable. Median sternotomy sutures are noted from prior CABG. There are degenerative changes in the mid to lower thoracic spine. IMPRESSION: 1. Left lower lobe opacity consistent with pneumonia. 2. Stable cardiomegaly. Electronically Signed   By: Ivar Drape M.D.   On: 10/09/2018 10:32   Ct Head Wo Contrast  Result Date: 10/09/2018 CLINICAL DATA:  Altered level of consciousness. EXAM: CT HEAD WITHOUT CONTRAST TECHNIQUE: Contiguous axial images were obtained from the base of the skull through the vertex without intravenous contrast. COMPARISON:  11/21/2014 FINDINGS: Brain: No evidence of acute infarction, hemorrhage, hydrocephalus, extra-axial collection or mass lesion/mass effect. There is marked diffuse low-attenuation within the subcortical and periventricular white matter compatible with chronic microvascular disease. Chronic brainstem lacunar infarct identified. Prominence of the sulci and ventricles identified compatible with brain atrophy. Vascular: No hyperdense vessel or unexpected calcification. Skull: Normal. Negative for fracture or focal lesion. Sinuses/Orbits: There are postoperative changes from bilateral ethmoidectomy and median antrectomy. Chronic mucosal thickening involving the frontal sinuses and sphenoid sinus and bilateral maxillary sinuses noted. The mastoid air  cells appear clear. Other: None IMPRESSION: 1. Chronic small vessel ischemic disease and brain atrophy. No acute intracranial abnormality noted. 2. Previous sinus surgery with chronic sinus inflammation. Electronically Signed   By: Kerby Moors M.D.   On: 10/09/2018 11:05    Procedures Procedures (including critical care time)  Medications Ordered in ED Medications  0.9 %  sodium chloride infusion ( Intravenous New Bag/Given 10/09/18 1031)  azithromycin (ZITHROMAX) 500 mg in sodium chloride 0.9 % 250 mL IVPB (has no administration in time range)  cefTRIAXone (ROCEPHIN) 1 g in sodium chloride 0.9 % 100 mL IVPB (0 g Intravenous Stopped 10/09/18 1239)     Initial Impression / Assessment and Plan / ED Course  I have reviewed the triage vital signs and the nursing notes.  Pertinent labs & imaging results that were available during my care of the patient were reviewed by me and considered in my medical decision making (see chart for details).  Clinical Course as of Oct 09 1240  Thu Oct 09, 2018  1154 Patient's labs are notable for an elevated white blood cell count.  Labs are otherwise unremarkable.  Chest x-ray does show evidence of pneumonia.   [JK]  1154 Antibiotics ordered.   [JK]    Clinical Course User Index [JK] Dorie Rank, MD    Patient presented to the emergency room for evaluation of confusion.  EMS noted the patient was hypoxic at home.  I suspect this was the primary etiology for his confusion.  His oxygenation is normal here in the emergency room.  He improved with oxygen supplementation.  Patient's x-rays are notable for pneumonia.  He is otherwise stable without any signs of sepsis.  Plan on admission to hospital for IV antibiotics and further treatment.  Final Clinical Impressions(s) / ED Diagnoses   Final diagnoses:  Community acquired pneumonia of left lung, unspecified part of lung      Dorie Rank, MD 10/09/18 1241

## 2018-10-09 NOTE — H&P (Signed)
History and Physical    [name]. EOF:[id number] DOB: 1926-09-22 DOA: 10/09/2018  PCP: Colon Branch, MD Consultants:  Gwenlyn Found - cardiology; Janace Hoard - ENT; Junious Silk - urology Patient coming from: Home - lives with wife; [name]: Wife, [phone]; [phone]  Chief Complaint: AMS  HPI: [name]. is a 84 y.o. male with medical history significant of DM; CAD s/p CABG and stent; NICM with reduced EF; HTN; HLD; and BPH presenting with AMS.  He was really disoriented last night.  He was agitated.  He had difficulty lying down to sleep.  In the middle of the night, he asked his wife to get him up and dressed.  He feel a few hours later and his wife couldn't get him up.  He was fine yesterday and during the holidays. They are noticing some memory problems.  He was previously admitted for heart problems.  After d/c, he was diagnosed with a UTI and they think they caused his disorientation.  +SOB prior to last hospitalization, but none recently.  He did not have apparent respiratory issues overnight other than with agitation.  +cough, generally nonproductive.  No fevers.  Slight wheezing.  HHRN was concerned about edema.  No known weight changes.  No chest pain.   ED Course:   Appears to be PNA causing confusion.  MI with hospitalization last month.  PCP gave Cipro for possible prostatitis recently.  Went to urgent care and diagnosed with laryngitis.  Last night, confused, not recognizing family.  EMS noted hypoxia to 80s, improved MS once given O2.  WBC 20s.  CXR with PNA.  No fever.  +sepsis - lactate ordered.  Review of Systems: As per HPI; otherwise review of systems reviewed and negative.   Ambulatory Status:  Ambulates with a walker  Past Medical History:  Diagnosis Date  . Acute CHF ([place]) 09/15/2012  . Asthma    pt denies this hx on 11/02/2013  . BPH (benign prostatic hyperplasia)   . CAD (coronary artery disease)    CABG '97, SVG DES 12/13  . Colonic polyp   . Exertional shortness  of breath   . GERD (gastroesophageal reflux disease)   . Hearing loss   . History of pancreatitis    pt denies this hx on 11/02/2013  . Hyperlipidemia   . Hypertension   . Intrinsic asthma 06/18/2007   Qualifier: Diagnosis of  By: Dance CMA ([place]), Kim    . Ischemic cardiomyopathy 09/16/2012   EF 25%- improved to 45% 3/14  . Myocardial infarction ([place]) 1997  . NSTEMI (non-ST elevated myocardial infarction) ([place]) 09/06/2018  . Obstructed, uropathy   . PVD (peripheral vascular disease) (HCC)    moderate bilat carotid disease  . Syncope and collapse 11/02/2013   "didn't pass out" (11/02/2013)  . Type II diabetes mellitus ([place])     Past Surgical History:  Procedure Laterality Date  . CATARACT EXTRACTION W/ INTRAOCULAR LENS  IMPLANT, BILATERAL Bilateral ~ 1970  . CHOLECYSTECTOMY  2000's  . CORONARY ANGIOPLASTY WITH STENT PLACEMENT  Dec 2013   DES to SVG-CFX/PDA  . CORONARY ARTERY BYPASS GRAFT  1997   "CABG X 5"  . CORONARY STENT INTERVENTION N/A 09/08/2018   Procedure: CORONARY STENT INTERVENTION;  Surgeon: Lorretta Harp, MD;  Location: [facility];  Service: Cardiovascular;  Laterality: N/A;  . INGUINAL HERNIA REPAIR Bilateral ~ 1950  . LEFT HEART CATH AND CORS/GRAFTS ANGIOGRAPHY N/A 09/08/2018   Procedure: LEFT HEART CATH AND CORS/GRAFTS ANGIOGRAPHY;  Surgeon: Lorretta Harp, MD;  Location: [facility];  Service: Cardiovascular;  Laterality: N/A;  . LEFT HEART CATHETERIZATION WITH CORONARY/GRAFT ANGIOGRAM N/A 09/16/2012   Procedure: LEFT HEART CATHETERIZATION WITH Beatrix Fetters;  Surgeon: Lorretta Harp, MD;  Location: [facility];  Service: Cardiovascular;  Laterality: N/A;  . NASAL SEPTUM SURGERY  2007  . RETINAL DETACHMENT SURGERY Left ?1995  . SHOULDER OPEN ROTATOR CUFF REPAIR Right     Social History   Socioeconomic History  . Marital status: Married    Spouse name: Not on file  . Number of children: 1  . Years of education: Not on file  .  Highest education level: Not on file  Occupational History  . 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 Financial resource strain: Not on file  . Food insecurity:    Worry: Not on file    Inability: Not on file  . Transportation needs:    Medical: Not on file    Non-medical: Not on file  Tobacco Use  . Smoking status: Never Smoker  . Smokeless tobacco: Never Used  . Tobacco comment: 11/02/2013 "smoked cigars when I was 18 or so; didn't smoke many"  Substance and Sexual Activity  . Alcohol use: Yes    Comment: rare  . Drug use: No  . Sexual activity: Not Currently  Lifestyle  . Physical activity:    Days per week: Not on file    Minutes per session: Not on file  . Stress: Not on file  Relationships  . Social connections:    Talks on phone: Not on file    Gets together: Not on file    Attends religious service: Not on file    Active member of club or organization: Not on file    Attends meetings of clubs or organizations: Not on file    Relationship status: Not on file  . Intimate partner violence:    Fear of current or ex partner: Not on file    Emotionally abused: Not on file    Physically abused: Not on file    Forced sexual activity: Not on file  Other Topics Concern  . 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 Ativan [Lorazepam] Other (See Comments)    "Made him crazy"  . Colesevelam Other (See Comments)    Pancreatitis (??)  . Ezetimibe-Simvastatin Other (See Comments)    Vytorin caused pancreatitis    Family History  Problem Relation Age of Onset  . Prostate cancer Brother        dx ~ 8 y/o  . Colon cancer Neg Hx     Prior to Admission medications   Medication Sig Start Date End Date Taking? Authorizing Provider  acetaminophen (TYLENOL)  500 MG tablet Take 1,000 mg by mouth every 6 (six) hours as needed (for pain or headaches).    Yes [provider]  albuterol (PROVENTIL HFA;VENTOLIN HFA) 108 (90 Base) MCG/ACT inhaler Inhale 2 puffs into the lungs every 6 (six) hours as needed for wheezing or shortness of breath. 08/29/18  Yes Colon Branch, MD  aspirin EC 81 MG EC tablet Take 2 tablets (162 mg total) by mouth daily. 09/18/12  Yes Erlene Quan, PA-C  carvedilol (COREG) 6.25 MG tablet Take 1 tablet (6.25 mg total) by mouth 2 (two) times daily with a meal. 09/10/18  Yes Kroeger, Daleen Snook M., PA-C  clopidogrel (PLAVIX) 75 MG tablet TAKE 1 TABLET DAILY WITH   BREAKFAST. KEEP OFFICE     VISIT. Patient taking differently: Take 75 mg by mouth daily.  06/30/18  Yes Lorretta Harp, MD  ezetimibe (ZETIA) 10 MG tablet TAKE 1 TABLET DAILY. KEEP  OFFICE VISIT Patient taking differently: Take 10 mg by mouth daily.  06/30/18  Yes Lorretta Harp, MD  finasteride (PROSCAR) 5 MG tablet Take 1 tablet (5 mg total) by mouth at bedtime. 10/06/18  Yes Paz, Alda Berthold, MD  Fluticasone-Salmeterol (ADVAIR) 100-50 MCG/DOSE AEPB Inhale 1 puff into the lungs 2 (two) times daily. 08/09/17  Yes Paz, Alda Berthold, MD  furosemide (LASIX) 40 MG tablet Take 1 tablet (40 mg total) by mouth daily. 09/22/18  Yes Kilroy, Luke K, PA-C  Grape Seed 100 MG CAPS Take 100 mg by mouth daily.    Yes [provider]  Insulin Glargine (BASAGLAR KWIKPEN) 100 UNIT/ML SOPN Inject 0.12 mLs (12 Units total) into the skin at bedtime. 09/24/18  Yes Paz, Alda Berthold, MD  isosorbide mononitrate (IMDUR) 30 MG 24 hr tablet Take 0.5 tablets (15 mg total) by mouth daily. 09/29/18  Yes Lorretta Harp, MD  lisinopril (PRINIVIL,ZESTRIL) 2.5 MG tablet Take 1 tablet (2.5 mg total) by mouth at bedtime. 09/22/18  Yes Kilroy, Luke K, PA-C  loperamide (IMODIUM A-D) 2 MG tablet Take 2 mg by mouth 4 (four) times daily as needed for diarrhea or loose stools.   Yes [provider]  Melatonin  3 MG TABS Take 2 tablets by mouth at bedtime.   Yes [provider]  metFORMIN (GLUCOPHAGE) 850 MG tablet Take 850 mg by mouth 2 (two) times daily with a meal.   Yes [provider]  nitroGLYCERIN (NITROSTAT) 0.4 MG SL tablet Place 1 tablet (0.4 mg total) under the tongue every 5 (five) minutes x 3 doses as needed for chest pain. 02/04/18  Yes Paz, Alda Berthold, MD  pantoprazole (PROTONIX) 40 MG tablet TAKE 1 TABLET DAILY. KEEP  OFFICE VISIT. Patient taking differently: Take 40 mg by mouth daily before breakfast.  06/30/18  Yes Lorretta Harp, MD  rosuvastatin (CRESTOR) 10 MG tablet TAKE 1 TABLET DAILY. Patient taking differently: Take 10 mg by mouth daily.  06/30/18  Yes Lorretta Harp, MD  tamsulosin (FLOMAX) 0.4 MG CAPS capsule Take 1 capsule (0.4 mg total) by mouth daily after supper. 09/24/18  Yes Paz, Alda Berthold, MD  XIIDRA 5 % SOLN Place 1 drop into the left eye 2 (two) times daily. 08/27/18  Yes [provider]  Alcohol Swabs (ALCOHOL PREP) 70 % PADS Reported on 04/30/2016 02/23/14   [provider]  Blood Glucose Monitoring Suppl (BLOOD GLUCOSE METER) kit Use as instructed 04/08/13   Norins, Heinz Knuckles, MD  ciprofloxacin (CIPRO) 250 MG tablet Take 1 tablet (250 mg total) by mouth 2 (two) times daily. 09/24/18   Colon Branch, MD  glucose blood test strip Use to test blood sugars twice daily 02/01/14   Norins, Heinz Knuckles, MD  Insulin Pen Needle 32G X 4 MM MISC To use w/ Basaglar 11/29/15   Colon Branch, MD  Lancet Devices (ADJUSTABLE LANCING DEVICE) MISC Reported on 04/30/2016 02/23/14   [provider]    Physical Exam: Vitals:  10/09/18 1300 10/09/18 1315 10/09/18 1348 10/09/18 1414  BP: (!) 104/91 103/89 (!) 99/47 (!) 125/54  Pulse: (!) 40 (!) 41 (!) 43 90  Resp: (!) 22 (!) 24 (!) 28 (!) 24  Temp:    98.9 F (37.2 C)  TempSrc:    Oral  SpO2: 97% 99% 97% 95%     General:  Appears calm and comfortable and is NAD on [place] O2 Eyes:  PERRL, EOMI, normal  lids, iris ENT:  Hard of hearing, normal lips & tongue, mmm Neck:  no LAD, masses or thyromegaly Cardiovascular:  RRR, no m/r/g. No LE edema.  Respiratory:   CTA bilaterally with R>L bibasilar rhonchi  Normal respiratory effort on [place] O2. Abdomen:  soft, NT, ND, NABS Skin:  no rash or induration seen on limited exam Musculoskeletal:  grossly normal tone BUE/BLE, good ROM, no bony abnormality Psychiatric:  grossly normal mood and affect, speech fluent and appropriate, AOx1-2 (his adult grandson was assisting with the answers and so it was difficult to tell how much the patient really knew himself) Neurologic:  CN 2-12 grossly intact, moves all extremities in coordinated fashion, sensation intact    Radiological Exams on Admission: Dg Chest 2 View  Result Date: 10/09/2018 CLINICAL DATA:  Congestion, cough, shortness of breath EXAM: CHEST - 2 VIEW COMPARISON:  Portable chest x-ray of 09/06/2017 FINDINGS: There is opacity posteriorly at the medial left lung base consistent with left lower lobe pneumonia. A small left pleural effusion cannot be excluded. The right lung appears clear. Cardiomegaly is stable. Median sternotomy sutures are noted from prior CABG. There are degenerative changes in the mid to lower thoracic spine. IMPRESSION: 1. Left lower lobe opacity consistent with pneumonia. 2. Stable cardiomegaly. Electronically Signed   By: Ivar Drape M.D.   On: 10/09/2018 10:32   Ct Head Wo Contrast  Result Date: 10/09/2018 CLINICAL DATA:  Altered level of consciousness. EXAM: CT HEAD WITHOUT CONTRAST TECHNIQUE: Contiguous axial images were obtained from the base of the skull through the vertex without intravenous contrast. COMPARISON:  11/21/2014 FINDINGS: Brain: No evidence of acute infarction, hemorrhage, hydrocephalus, extra-axial collection or mass lesion/mass effect. There is marked diffuse low-attenuation within the subcortical and periventricular white matter compatible with chronic  microvascular disease. Chronic brainstem lacunar infarct identified. Prominence of the sulci and ventricles identified compatible with brain atrophy. Vascular: No hyperdense vessel or unexpected calcification. Skull: Normal. Negative for fracture or focal lesion. Sinuses/Orbits: There are postoperative changes from bilateral ethmoidectomy and median antrectomy. Chronic mucosal thickening involving the frontal sinuses and sphenoid sinus and bilateral maxillary sinuses noted. The mastoid air cells appear clear. Other: None IMPRESSION: 1. Chronic small vessel ischemic disease and brain atrophy. No acute intracranial abnormality noted. 2. Previous sinus surgery with chronic sinus inflammation. Electronically Signed   By: Kerby Moors M.D.   On: 10/09/2018 11:05    EKG: Independently reviewed.  Sinus bradycardia with rate 44; LVH; prolonged QT 575; nonspecific ST changes with no evidence of acute ischemia   Labs on Admission: I have personally reviewed the available labs and imaging studies at the time of the admission.  Pertinent labs:   Glucose 103 WBC 20.1 Hgb 11.8 BNP >4500; 2072.5 on 11/23  Assessment/Plan Principal Problem:   Acute respiratory failure with hypoxia (HCC) Active Problems:   Dyslipidemia, goal LDL below 70   BPH (benign prostatic hyperplasia)   Insulin dependent diabetes mellitus with complications ([place])   Sleep apnea- C-pap   Carotid artery disease ([place])  Chronic combined systolic and diastolic CHF (congestive heart failure) (HCC)   Essential hypertension   Dementia (HCC)   Acute respiratory failure with hypoxia -Patient presenting with confusion since last night -He was found to be hypoxic into the 80s and his confusion improved once he was started on [place] O2 -He does not have any obvious respiratory symptoms other than intermittent coarse cough without productive sputum -CXR indicative of PNA, lactate minimally elevated at 1.95 -Patient with recent NSTEMI s/p  stents and BNP is markedly elevated above baseline -May be a mixed picture  Possible PNA -SIRS criteria in this patient includes: Leukocytosis, tachypnea, hypoxia  -Patient has evidence of acute organ failure with elevated lactate -While awaiting blood cultures, this may be a preseptic condition. -Sepsis protocol initiated -Treat with IV Cefepime/Vanc for HCAP given recent hospitalization for NSTEMI >72 hours -Will trend lactate to ensure improvement -Will order lower respiratory tract procalcitonin level.  Antibiotics would not be indicated for PCT <0.1 and probably should not be used for < 0.25.  >0.5 indicates infection and >>0.5 indicates more serious disease.  As the procalcitonin level normalizes, it will be reasonable to consider de-escalation of antibiotic coverage. -CURB-65 score is 2 - will admit the patient to Telemetry. -Pneumonia Severity Index (PSI) is Class 4, 9% mortality. -Corticosteroids have been to shown to low overall mortality rate; risk of ARDS; and need for mechanical ventilation.  This is particularly true in severe PNA (class 3+ PSI).  Will add 20 mg prednisone daily for 5 days. -The patient has the following criteria for MDR (multi-drug resistance):  >2 days of inpatient treatment within the last 90 days. -The patient will need treatment for HCAP due to MDR risk factors as above; will treat with Cefepime and Vancomycin. -IVF held due to concern for possible CHF component - will bolus as needed -Fever control -Repeat CBC in am -Sputum cultures -Blood cultures -Strep pneumo testing -Will order lower respiratory tract procalcitonin level.  Antibiotics would not be indicated for PCT <0.1 and probably should not be used for < 0.25.  >0.5 indicates infection and >>0.5 indicates more serious disease.  As the procalcitonin level normalizes, it will be reasonable to consider de-escalation of antibiotic coverage. -albuterol PRN  Chronic CHF, possible acute  component -Patient with recent admission for NSTEMI requiring stent placement -He did not have an echo during that hospitalization -Last echo was in 5/19 with EF 25-30% and grade 2 diastolic dysfunction -He does not have edema or reported weight gain today, but his BNP is markedly elevated compared to prior and this raises concern for a CHF component -Will give 40 mg IV Lasix BID and bolus as needed for lactic acid elevation or other concerns for sepsis -Continue Coreg and Lisinopril  HTN -Continue Coreg, Lisinopril  HLD -Continue Crestor, Zetia  DM -Glucose goal is in 150-200 range -hold Glucophage -Will not cover with SSI at this time based on glucose 103  BPH with urinary retention -Patient unable to void -He had 600+ cc urine on bladder scan -Nurse asked to encourage voiding and if unable, will place foley  OSA on CPAP -Continue CPAP  Dementia -Family counseled about how patients with dementia often do better behaviorally while hospitalized if family members are present overnight  DVT prophylaxis:  Lovenox Code Status:  DNR  - confirmed with patient/family Family Communication: Wife, daughter, grandson present throughout evaluation  Disposition Plan:  Home once clinically improved Consults called: None  Admission status: Admit - It is my clinical  opinion that admission to INPATIENT is reasonable and necessary because of the expectation that this patient will require hospital care that crosses at least 2 midnights to treat this condition based on the medical complexity of the problems presented.  Given the aforementioned information, the predictability of an adverse outcome is felt to be significant.   Karmen Bongo MD [facility]  If note is complete, please contact covering daytime or nighttime physician. [email or web address] Password [facility]  10/09/2018, 2:23 PM

## 2018-10-09 NOTE — ED Triage Notes (Signed)
Pt is here with shortness of breath with recent symptoms starting around Monday and was seen at urgent care.  Then last night started becoming altered and he fell sometime around 0230 unwitnessed.  Reported weakness.  EMS reported patient's 02 sat 86% RA.  CBG 132. BP 108/44.  Pt is alert to self and place.  Pt sounds congested with cough.  Pt feels warm.

## 2018-10-09 NOTE — Progress Notes (Signed)
Pharmacy Antibiotic Note  [name]. is a 84 y.o. male admitted on 10/09/2018 with pneumonia.  Pharmacy has been consulted for vancomycin dosing.  Received azithromycin and ceftriaxone x 1 in ED, changed to Cefepime per admitting MD.  WBC 20.1, AF, LA 1.95 SCr 1.08,  CrCl ~ 39mL/min  Plan: Vancomycin 1500mg  IV x 1, then 1000mg  IV every 24h (calc AUC 491, SCr 1.08) Monitor renal function, Cx and MRSA PCR to narrow, level at steady state if continues on vancomycin    Temp (24hrs), Avg:98.5 F (36.9 C), Min:98.3 F (36.8 C), Max:98.6 F (37 C)  Recent Labs  Lab 10/09/18 1010 10/09/18 1324  WBC 20.1*  --   CREATININE 1.08  --   LATICACIDVEN  --  1.95*    CrCl cannot be calculated (Unknown ideal weight.).    Allergies  Allergen Reactions  . Ativan [Lorazepam] Other (See Comments)    "Made him crazy"  . Colesevelam Other (See Comments)    Pancreatitis (??)  . Ezetimibe-Simvastatin Other (See Comments)    Vytorin caused pancreatitis    Antimicrobials this admission: Ceftriaxone 12/26 x 1 azithro 12/26 x 1 Cefepime 12/26>> vanc 12/26>>  Dose adjustments this admission: n/a  Microbiology results: 12/26 BCx: sent 12/26 sputum Cx: sent  Daylene Posey, PharmD Clinical Pharmacist Please check AMION for all [facility] numbers 10/09/2018 1:53 PM

## 2018-10-10 ENCOUNTER — Other Ambulatory Visit: Payer: Self-pay

## 2018-10-10 ENCOUNTER — Encounter (HOSPITAL_COMMUNITY): Payer: Self-pay | Admitting: Physician Assistant

## 2018-10-10 LAB — BASIC METABOLIC PANEL
Anion gap: 14 (ref 5–15)
BUN: 28 mg/dL — ABNORMAL HIGH (ref 8–23)
CALCIUM: 8.4 mg/dL — AB (ref 8.9–10.3)
CHLORIDE: 94 mmol/L — AB (ref 98–111)
CO2: 25 mmol/L (ref 22–32)
CREATININE: 1.13 mg/dL (ref 0.61–1.24)
GFR calc Af Amer: >60 mL/min (ref >60)
GFR calc non Af Amer: 57 mL/min — ABNORMAL LOW (ref >60)
Glucose, Bld: 224 mg/dL — ABNORMAL HIGH (ref 70–99)
Potassium: 3.8 mmol/L (ref 3.5–5.1)
Sodium: 133 mmol/L — ABNORMAL LOW (ref 135–145)

## 2018-10-10 LAB — PATHOLOGIST SMEAR REVIEW

## 2018-10-10 LAB — CBC WITH DIFFERENTIAL/PLATELET
Abs Immature Granulocytes: 0 10*3/uL (ref 0.00–0.07)
Basophils Absolute: 0 10*3/uL (ref 0.0–0.1)
Basophils Relative: 0 %
Eosinophils Absolute: 0 10*3/uL (ref 0.0–0.5)
Eosinophils Relative: 0 %
HEMATOCRIT: 32.3 % — AB (ref 39.0–52.0)
Hemoglobin: 10.8 g/dL — ABNORMAL LOW (ref 13.0–17.0)
Lymphocytes Relative: 3 %
Lymphs Abs: 0.7 10*3/uL (ref 0.7–4.0)
MCH: 29.8 pg (ref 26.0–34.0)
MCHC: 33.4 g/dL (ref 30.0–36.0)
MCV: 89 fL (ref 80.0–100.0)
Monocytes Absolute: 0.7 10*3/uL (ref 0.1–1.0)
Monocytes Relative: 3 %
Neutro Abs: 23 10*3/uL — ABNORMAL HIGH (ref 1.7–7.7)
Neutrophils Relative %: 94 %
Platelets: 180 10*3/uL (ref 150–400)
RBC: 3.63 MIL/uL — ABNORMAL LOW (ref 4.22–5.81)
RDW: 14.5 % (ref 11.5–15.5)
WBC: 24.5 10*3/uL — ABNORMAL HIGH (ref 4.0–10.5)
nRBC: 0 % (ref 0.0–0.2)
nRBC: 0 /100 WBC

## 2018-10-10 LAB — MAGNESIUM
Magnesium: 1.1 mg/dL — ABNORMAL LOW (ref 1.7–2.4)
Magnesium: 2.2 mg/dL (ref 1.7–2.4)

## 2018-10-10 MED ORDER — AMIODARONE HCL IN DEXTROSE 360-4.14 MG/200ML-% IV SOLN
60.0000 mg/h | INTRAVENOUS | Status: DC
  Administered 2018-10-10 (×2): 60 mg/h via INTRAVENOUS
  Filled 2018-10-10 (×2): qty 200

## 2018-10-10 MED ORDER — SODIUM CHLORIDE 0.9 % IV SOLN
2.0000 g | INTRAVENOUS | Status: DC
  Administered 2018-10-10 – 2018-10-14 (×5): 2 g via INTRAVENOUS
  Filled 2018-10-10 (×5): qty 20

## 2018-10-10 MED ORDER — AMIODARONE LOAD VIA INFUSION
150.0000 mg | Freq: Once | INTRAVENOUS | Status: AC
  Administered 2018-10-10: 150 mg via INTRAVENOUS
  Filled 2018-10-10: qty 83.34

## 2018-10-10 MED ORDER — MAGNESIUM SULFATE 50 % IJ SOLN: 4.0000 g | Freq: Once | INTRAVENOUS | Status: DC

## 2018-10-10 MED ORDER — MAGNESIUM SULFATE 4 GM/100ML IV SOLN
4.0000 g | Freq: Once | INTRAVENOUS | Status: AC
  Administered 2018-10-10: 4 g via INTRAVENOUS
  Filled 2018-10-10 (×2): qty 100

## 2018-10-10 MED ORDER — AMIODARONE HCL IN DEXTROSE 360-4.14 MG/200ML-% IV SOLN
30.0000 mg/h | INTRAVENOUS | Status: DC
  Administered 2018-10-10 – 2018-10-11 (×2): 30 mg/h via INTRAVENOUS
  Filled 2018-10-10 (×2): qty 200

## 2018-10-10 MED ORDER — PREDNISONE 20 MG PO TABS
20.0000 mg | ORAL_TABLET | Freq: Every day | ORAL | Status: AC
  Administered 2018-10-11 – 2018-10-13 (×3): 20 mg via ORAL
  Filled 2018-10-10 (×3): qty 1

## 2018-10-10 NOTE — Progress Notes (Addendum)
[facility]                                                                              Patient Demographics  [name], is a 84 y.o. male, DOB - [birth year], HQI:[id number]  Admit date - 10/09/2018   Admitting Physician Karmen Bongo, MD  Outpatient Primary MD for the patient is Colon Branch, MD  Outpatient specialists:   LOS - 1  days   Medical records reviewed and are as summarized below:    Chief Complaint  Patient presents with  . Altered Mental Status  . Shortness of Breath       Brief summary   Patient is a 84 year old male with history of diabetes, CAD status post CABG and stent, recent NSTEMI in 13/2440, chronic systolic CHF with reduced EF 20-25%, hypertension, hyperlipidemia presented to ED with shortness of breath, altered mental status.  Patient was discharged from the hospital and was recovering well, had some urinary symptoms and had received ciprofloxacin for possible prostatitis by Dr. Bascom Levels on December 11.  In the past 1 week, patient was having cough and congestion, went to urgent care and was diagnosed with acute laryngitis.  Night before the admission, patient became very confused, not recognizing his family members.  He had a fall that night but did not seem to injure himself.  He was hypoxic, with O2 sats in 80s when EMS arrived.  Chest x-ray showed left lung pneumonia, patient was admitted for further work-up.   Assessment & Plan    Principal Problem:   Acute respiratory failure with hypoxia (HCC) with sepsis, left lung pneumonia/ HCAP, strep pneumonia -Patient was hypoxic and confused, which has now much improved.  Was placed on O2 4 L in ED, O2 sats 97%, weaning O2 as tolerated -Patient met sepsis criteria due to lactic acidosis, leukocytosis, tachycardia, hypoxia source likely due to left lung pneumonia. -Blood cultures negative so far, urine strep antigen positive -Patient was placed on IV vancomycin and cefepime, will de-escalate  to IV Rocephin  Active Problems:  Mild acute on chronic combined systolic and diastolic CHF -2D echo 10/27 showed EF of 20 to 25% with diffuse hypokinesis (2D echo 02/2018 had shown 25 to 30% EF with grade 2 diastolic dysfunction) -BNP > 4500, chest x-ray with congestion and pneumonia, recent N STEMI -Patient placed on IV Lasix 40 mg every 12 hours -Follow I's and O's and daily weights      Dyslipidemia, goal LDL below 70 -Continue Zetia    BPH (benign prostatic hyperplasia) with urinary retention -Continue IV Rocephin, Flomax, finasteride -Patient was unable to void in ED, had 600+ cc urine on bladder scan    Insulin dependent diabetes mellitus with complications ([facility]) -CBGs currently controlled, placed on sliding scale insulin sensitive   OSA -Continue CPAP    Dementia (HCC) Currently stable, close to his baseline per grandson at the bedside  NSVT/PVCs, severe hypomagnesemia Magnesium 1.1, placed on IV magnesium 4 g x 1 Repeat mag level 2.2  Code Status: DNR DVT Prophylaxis: Lovenox Family Communication: Discussed in detail with the patient, all imaging results, lab results explained to the patient  and grandson at the bedside    Disposition Plan: Not medically ready  Time Spent in minutes   35 minutes  Procedures:  Chest x-ray  Consultants:   Cardiology  Antimicrobials:      Medications  Scheduled Meds: . aspirin EC  162 mg Oral Daily  . clopidogrel  75 mg Oral Daily  . enoxaparin (LOVENOX) injection  40 mg Subcutaneous Q24H  . ezetimibe  10 mg Oral Daily  . finasteride  5 mg Oral QHS  . furosemide  40 mg Intravenous Q12H  . insulin glargine  12 Units Subcutaneous QHS  . isosorbide mononitrate  15 mg Oral Daily  . lisinopril  2.5 mg Oral QHS  . Melatonin  2 tablet Oral QHS  . mometasone-formoterol  2 puff Inhalation BID  . pantoprazole  40 mg Oral QAC breakfast  . predniSONE  20 mg Oral Q breakfast  . rosuvastatin  10 mg Oral QHS  . sodium  chloride flush  3 mL Intravenous Q12H  . tamsulosin  0.4 mg Oral QPC supper   Continuous Infusions: . sodium chloride 10 mL/hr at 10/09/18 2000  . cefTRIAXone (ROCEPHIN)  IV 2 g (10/10/18 1001)   PRN Meds:.sodium chloride, acetaminophen, albuterol, ondansetron (ZOFRAN) IV, sodium chloride flush   Antibiotics   Anti-infectives (From admission, onward)   Start     Dose/Rate Route Frequency Ordered Stop   10/10/18 1500  vancomycin (VANCOCIN) IVPB 1000 mg/200 mL premix  Status:  Discontinued     1,000 mg 200 mL/hr over 60 Minutes Intravenous Every 24 hours 10/09/18 1434 10/10/18 0923   10/10/18 1100  cefTRIAXone (ROCEPHIN) 2 g in sodium chloride 0.9 % 100 mL IVPB     2 g 200 mL/hr over 30 Minutes Intravenous Every 24 hours 10/10/18 0923     10/09/18 1800  ceFEPIme (MAXIPIME) 1 g in sodium chloride 0.9 % 100 mL IVPB  Status:  Discontinued     1 g 200 mL/hr over 30 Minutes Intravenous Every 12 hours 10/09/18 1608 10/10/18 0923   10/09/18 1430  vancomycin (VANCOCIN) 1,500 mg in sodium chloride 0.9 % 500 mL IVPB     1,500 mg 250 mL/hr over 120 Minutes Intravenous  Once 10/09/18 1429 10/09/18 1915   10/09/18 1400  ceFEPIme (MAXIPIME) 1 g in sodium chloride 0.9 % 100 mL IVPB  Status:  Discontinued     1 g 200 mL/hr over 30 Minutes Intravenous Every 8 hours 10/09/18 1349 10/09/18 1608   10/09/18 1200  cefTRIAXone (ROCEPHIN) 1 g in sodium chloride 0.9 % 100 mL IVPB     1 g 200 mL/hr over 30 Minutes Intravenous  Once 10/09/18 1153 10/09/18 1239   10/09/18 1200  azithromycin (ZITHROMAX) 500 mg in sodium chloride 0.9 % 250 mL IVPB     500 mg 250 mL/hr over 60 Minutes Intravenous  Once 10/09/18 1153 10/09/18 1341        Subjective:   Victorino Fatzinger was seen and examined today.  Mental status improving, close to baseline per grandson at the bedside.  Coughing huge amounts of yellowish phlegm.  Otherwise no fevers this morning. Patient denies dizziness, chest pain, shortness of breath,  abdominal pain, N/V/D/C, new weakness, numbess, tingling.   Objective:   Vitals:   10/10/18 0455 10/10/18 0550 10/10/18 0716 10/10/18 0923  BP: 109/76 (!) 145/85 109/89   Pulse: (!) 155 (!) 149 91   Resp:      Temp:   98.3 F (36.8 C)   TempSrc:  Oral   SpO2: 100% 97% 95% 91%  Weight:      Height:        Intake/Output Summary (Last 24 hours) at 10/10/2018 1049 Last data filed at 10/10/2018 0917 Gross per 24 hour  Intake 1713.38 ml  Output 1775 ml  Net -61.62 ml     Wt Readings from Last 3 Encounters:  10/10/18 71.7 kg  09/24/18 76.7 kg  09/22/18 73.9 kg     Exam  General: Alert and oriented x 3, NAD  Eyes: PERRLA, EOMI, Anicteric Sclera,  HEENT:  Atraumatic, normocephalic, normal oropharynx  Cardiovascular: S1 S2 auscultated, Regular rate and rhythm.  Respiratory: Bilateral rhonchi left >right  Gastrointestinal: Soft, nontender, nondistended, + bowel sounds  Ext: no pedal edema bilaterally  Neuro: No new deficits  Musculoskeletal: No digital cyanosis, clubbing  Skin: No rashes  Psych: Fairly alert and oriented   Data Reviewed:  I have personally reviewed following labs and imaging studies  Micro Results Recent Results (from the past 240 hour(s))  Culture, sputum-assessment     Status: None   Collection Time: 10/09/18  7:20 PM  Result Value Ref Range Status   Specimen Description EXPECTORATED SPUTUM  Final   Special Requests NONE  Final   Sputum evaluation   Final    THIS SPECIMEN IS ACCEPTABLE FOR SPUTUM CULTURE Performed at [facility], [address] [place], [place] [address]    Report Status 10/09/2018 FINAL  Final  Culture, respiratory     Status: None (Preliminary result)   Collection Time: 10/09/18  7:20 PM  Result Value Ref Range Status   Specimen Description EXPECTORATED SPUTUM  Final   Special Requests NONE Reflexed from [id number]  Final   Gram Stain   Final    ABUNDANT WBC PRESENT, PREDOMINANTLY PMN MODERATE GRAM POSITIVE  COCCI RARE GRAM POSITIVE RODS    Culture   Final    TOO YOUNG TO READ Performed at [facility], [place] [address] [place], [place] [address]    Report Status PENDING  Incomplete    Radiology Reports Dg Chest 2 View  Result Date: 10/09/2018 CLINICAL DATA:  Congestion, cough, shortness of breath EXAM: CHEST - 2 VIEW COMPARISON:  Portable chest x-ray of 09/06/2017 FINDINGS: There is opacity posteriorly at the medial left lung base consistent with left lower lobe pneumonia. A small left pleural effusion cannot be excluded. The right lung appears clear. Cardiomegaly is stable. Median sternotomy sutures are noted from prior CABG. There are degenerative changes in the mid to lower thoracic spine. IMPRESSION: 1. Left lower lobe opacity consistent with pneumonia. 2. Stable cardiomegaly. Electronically Signed   By: Ivar Drape M.D.   On: 10/09/2018 10:32   Ct Head Wo Contrast  Result Date: 10/09/2018 CLINICAL DATA:  Altered level of consciousness. EXAM: CT HEAD WITHOUT CONTRAST TECHNIQUE: Contiguous axial images were obtained from the base of the skull through the vertex without intravenous contrast. COMPARISON:  11/21/2014 FINDINGS: Brain: No evidence of acute infarction, hemorrhage, hydrocephalus, extra-axial collection or mass lesion/mass effect. There is marked diffuse low-attenuation within the subcortical and periventricular white matter compatible with chronic microvascular disease. Chronic brainstem lacunar infarct identified. Prominence of the sulci and ventricles identified compatible with brain atrophy. Vascular: No hyperdense vessel or unexpected calcification. Skull: Normal. Negative for fracture or focal lesion. Sinuses/Orbits: There are postoperative changes from bilateral ethmoidectomy and median antrectomy. Chronic mucosal thickening involving the frontal sinuses and sphenoid sinus and bilateral maxillary sinuses noted. The mastoid air  cells appear clear. Other: None IMPRESSION: 1.  Chronic small vessel ischemic disease and brain atrophy. No acute intracranial abnormality noted. 2. Previous sinus surgery with chronic sinus inflammation. Electronically Signed   By: Kerby Moors M.D.   On: 10/09/2018 11:05    Lab Data:  CBC: Recent Labs  Lab 10/09/18 1010 10/10/18 0604  WBC 20.1* 24.5*  NEUTROABS 18.1* 23.0*  HGB 11.8* 10.8*  HCT 35.3* 32.3*  MCV 89.1 89.0  PLT 186 098   Basic Metabolic Panel: Recent Labs  Lab 10/09/18 1010 10/10/18 0604  NA 134* 133*  K 3.3* 3.8  CL 95* 94*  CO2 27 25  GLUCOSE 103* 224*  BUN 18 28*  CREATININE 1.08 1.13  CALCIUM 8.9 8.4*  MG  --  1.1*   GFR: Estimated Creatinine Clearance: 39.2 mL/min (by C-G formula based on SCr of 1.13 mg/dL). Liver Function Tests: Recent Labs  Lab 10/09/18 1010  AST 31  ALT 22  ALKPHOS 48  BILITOT 1.4*  PROT 6.4*  ALBUMIN 3.7   No results for input(s): LIPASE, AMYLASE in the last 168 hours. No results for input(s): AMMONIA in the last 168 hours. Coagulation Profile: No results for input(s): INR, PROTIME in the last 168 hours. Cardiac Enzymes: No results for input(s): CKTOTAL, CKMB, CKMBINDEX, TROPONINI in the last 168 hours. BNP (last 3 results) No results for input(s): PROBNP in the last 8760 hours. HbA1C: No results for input(s): HGBA1C in the last 72 hours. CBG: Recent Labs  Lab 10/09/18 1016  GLUCAP 94   Lipid Profile: No results for input(s): CHOL, HDL, LDLCALC, TRIG, CHOLHDL, LDLDIRECT in the last 72 hours. Thyroid Function Tests: No results for input(s): TSH, T4TOTAL, FREET4, T3FREE, THYROIDAB in the last 72 hours. Anemia Panel: No results for input(s): VITAMINB12, FOLATE, FERRITIN, TIBC, IRON, RETICCTPCT in the last 72 hours. Urine analysis:    Component Value Date/Time   COLORURINE YELLOW 10/09/2018 1610   APPEARANCEUR CLEAR 10/09/2018 1610   LABSPEC 1.019 10/09/2018 1610   PHURINE 5.0 10/09/2018 1610   GLUCOSEU 50 (A) 10/09/2018 1610   GLUCOSEU NEGATIVE  09/24/2018 1450   HGBUR SMALL (A) 10/09/2018 1610   BILIRUBINUR NEGATIVE 10/09/2018 1610   KETONESUR NEGATIVE 10/09/2018 1610   PROTEINUR 30 (A) 10/09/2018 1610   UROBILINOGEN 0.2 09/24/2018 1450   NITRITE NEGATIVE 10/09/2018 1610   LEUKOCYTESUR NEGATIVE 10/09/2018 1610       M.D. [facility] 10/10/2018, 10:49 AM  Pager: [phone] Between 7am to 7pm - call Pager - [phone]  After 7pm go to [email or web address] - password [email or web address]  Call night coverage person covering after 7pm

## 2018-10-10 NOTE — Progress Notes (Signed)
[facility] p[facility] at 0500 regarding change in pt HR. Pt went into a wide-complex tachycardia rates in the 150's, denied chest pain and new shortness of breath. BP 109/76 (87) O2 sats 100%. EKG performed, MD aware. Morning labs being drawn BMP and CBC. NO extra labs at this time. Continue current plan of care.  RN will continue to monitor.

## 2018-10-10 NOTE — Evaluation (Signed)
Physical Therapy Evaluation Patient Details Name: [name]. MRN: [id number] DOB: Feb 23, 1926 Today's Date: 10/10/2018   History of Present Illness  [name]. is a 84 y.o. male with medical history significant of DM; CAD s/p CABG and stent; NICM with reduced EF; HTN; HLD; and BPH presenting with AMS.  He was really disoriented last night.  He was agitated.  He had difficulty lying down to sleep.  In the middle of the night, he asked his wife to get him up and dressed.  He fell a few hours later and his wife couldn't get him up. Appears to have PNA causing confusion.   Clinical Impression  Pt admitted with above diagnosis. Pt currently with functional limitations due to the deficits listed below (see PT Problem List). Pt was able to ambulate with RW with min assist.  Family aware that they may need to walk with pt more than prior to admit initially and that pt should use RW at all times.  Issued gait belt for pt and family.  Pt will have 24 hour care.   Pt will benefit from skilled PT to increase their independence and safety with mobility to allow discharge to the venue listed below.      Follow Up Recommendations Home health PT;Supervision/Assistance - 24 hour    Equipment Recommendations  None recommended by PT    Recommendations for Other Services       Precautions / Restrictions Precautions Precautions: Fall Restrictions Weight Bearing Restrictions: No      Mobility  Bed Mobility Overal bed mobility: Needs Assistance Bed Mobility: Supine to Sit     Supine to sit: Min assist     General bed mobility comments: Pt needed a little assist to come to EOB for elevation of trunk.   Transfers Overall transfer level: Needs assistance Equipment used: Rolling walker (2 wheeled) Transfers: Sit to/from Stand Sit to Stand: Min assist;Mod assist;+2 safety/equipment         General transfer comment: Needed mod assist to power up due to posterior lean and once up mod  assist to steady as it took incr time for pt to get his balance.    Ambulation/Gait Ambulation/Gait assistance: Min assist;+2 safety/equipment Gait Distance (Feet): 125 Feet Assistive device: Rolling walker (2 wheeled) Gait Pattern/deviations: Step-through pattern;Decreased stride length;Trunk flexed;Wide base of support;Drifts right/left   Gait velocity interpretation: <1.31 ft/sec, indicative of household ambulator General Gait Details: Pt needed some cues to stay close to RW. Pt also needed guidance of RW at times.  Will have assist at home.  Cues for postural stability as well.    Stairs            Wheelchair Mobility    Modified Rankin (Stroke Patients Only)       Balance Overall balance assessment: Needs assistance Sitting-balance support: No upper extremity supported;Feet supported Sitting balance-Leahy Scale: Fair   Postural control: Posterior lean Standing balance support: Bilateral upper extremity supported;During functional activity Standing balance-Leahy Scale: Poor Standing balance comment: Mod assist to stand due to posterior lean as well as RW support                             Pertinent Vitals/Pain Pain Assessment: No/denies pain    Home Living Family/patient expects to be discharged to:: Private residence Living Arrangements: Spouse/significant other Available Help at Discharge: Family;Available 24 hours/day(wife has 2 sitters she calls if she has to leave )  Type of Home: House Home Access: Stairs to enter Entrance Stairs-Rails: Right Entrance Stairs-Number of Steps: 4 Home Layout: Two level;Able to live on main level with bedroom/bathroom Home Equipment: Gilmer MorCane - single point;Walker - 2 wheels;Shower seat;Grab bars - toilet      Prior Function Level of Independence: Independent with assistive device(s);Needs assistance   Gait / Transfers Assistance Needed: Per family, pt used canes vs RW but would leave it frequently.    ADL's /  Homemaking Assistance Needed: B/D with min assist        Hand Dominance   Dominant Hand: Right    Extremity/Trunk Assessment   Upper Extremity Assessment Upper Extremity Assessment: Defer to OT evaluation    Lower Extremity Assessment Lower Extremity Assessment: Generalized weakness    Cervical / Trunk Assessment Cervical / Trunk Assessment: Kyphotic  Communication   Communication: HOH  Cognition Arousal/Alertness: Awake/alert Behavior During Therapy: Flat affect Overall Cognitive Status: History of cognitive impairments - at baseline                                 General Comments: some memory issues      General Comments      Exercises General Exercises - Lower Extremity Ankle Circles/Pumps: AROM;Both;10 reps;Seated Long Arc Quad: AROM;Both;10 reps;Seated   Assessment/Plan    PT Assessment Patient needs continued PT services  PT Problem List Decreased activity tolerance;Decreased balance;Decreased mobility;Decreased knowledge of use of DME;Decreased safety awareness;Decreased knowledge of precautions       PT Treatment Interventions DME instruction;Gait training;Stair training;Functional mobility training;Therapeutic activities;Therapeutic exercise;Balance training;Patient/family education    PT Goals (Current goals can be found in the Care Plan section)  Acute Rehab PT Goals Patient Stated Goal: to go home PT Goal Formulation: With patient Time For Goal Achievement: 10/24/18 Potential to Achieve Goals: Good    Frequency Min 3X/week   Barriers to discharge        Co-evaluation               AM-PAC PT "6 Clicks" Mobility  Outcome Measure Help needed turning from your back to your side while in a flat bed without using bedrails?: A Little Help needed moving from lying on your back to sitting on the side of a flat bed without using bedrails?: A Little Help needed moving to and from a bed to a chair (including a wheelchair)?: A  Lot Help needed standing up from a chair using your arms (e.g., wheelchair or bedside chair)?: A Lot Help needed to walk in hospital room?: A Little Help needed climbing 3-5 steps with a railing? : A Lot 6 Click Score: 15    End of Session Equipment Utilized During Treatment: Gait belt Activity Tolerance: Patient limited by fatigue Patient left: in chair;with call bell/phone within reach;with chair alarm set;with family/visitor present Nurse Communication: Mobility status PT Visit Diagnosis: Unsteadiness on feet (R26.81)    Time: [id number] PT Time Calculation (min) (ACUTE ONLY): 18 min   Charges:   PT Evaluation $PT Eval Moderate Complexity: 1 Mod          Yarel Kilcrease,PT Acute Rehabilitation Services Pager:  [phone]  Office:  [phone]    Berline LopesDawn F Mandi Mattioli 10/10/2018, 12:55 PM

## 2018-10-10 NOTE — Progress Notes (Signed)
Patient refused CPAP for tonight. RT informed patient CPAP is available if he changes his mind. RT will monitor as needed. 

## 2018-10-10 NOTE — Consult Note (Addendum)
Cardiology Consultation:   Patient ID: [name].; [id number]; [birth year]   Admit date: 10/09/2018 Date of Consult: 10/10/2018  Primary Care Provider: Colon Branch, MD Primary Cardiologist: Quay Burow, MD Primary Electrophysiologist:  None  Chief Complaint: confusion, cough  Patient Profile:   [name]. is a 84 y.o. male with a hx of CAD (s/p CABG 1997, s/p DES to SVG-PDA 2013, NSTEMI 08/2018 s/p DES to SVG-PDA as below), chronic combined CHF, moderate pulm HTN, mildly dilated ascending aorta, HTN, HLD, DM II, asthma, PAD, BPH, vasovagal syncope in 01/2017, dementia, CKD II who is being seen today for the evaluation of CHF at the request of Dr. Tana Coast.  History of Present Illness:   To recap most recent cardiac hx, EF in 02/2018 was 25-30%. He was most recently admitted 08/2018 with an NSTEMI. Catheterization revealed disease in the SVG to RCA. He also had disease in the SVG to the ramus intermedius but this appeared to be somewhat of a small branch. He underwent intervention to the SVG to RCA with overlapping DES placement. The plan was to consider doing a staged intervention for the SVG to RI. Ultimately it was decided to hold off on this because of multiple comorbidities including dementia and CKD. At discharge the patient was placed in [facility] but has since gone home. He saw [name] in follow-up 09/22/18 and Lasix was cut back to once daily.  He was recently placed on Cipro for possible prostatitis by PCP 09/24/18 but then began having cough/congestion. He went to urgent care and was diagnosed with laryngitis but symptoms persisted. He also sustained a fall but without severe injury - he had hollered out for wife to help him after he fell so it's not clear if he had syncope or not. He has a large bruise on his left arm due to this. The night of admission, he developed worsening confusion and was not recognizing family. Family called EMS and he was hypoxic upon arrival.  He was found to have leukocytosis with WBC in the 20k range with increased bands and PNA on CXR. Strep pneumo positive. BNP >4500. Electrolytes are being managed by primary team. He was started on IV Lasix 34m BID upon arrival alongside antibiotics. Carvedilol was not continued with home meds, discontinued upon admission - although explicitly staged why, he has had intermittently softer BP. He also has mild hyponatremia, hypokalemia, mild anemia, AKI with BUN 28/Cr 1.13, Mg 1.1.  His wife says he looks significantly better this morning with improved breathing. He still appears to have very mild increased WOB and hacky, productive sounding cough. Tech indicates he did well with his walk this morning. No CP. 2D echo yesterday showed mild LVH, EF 20-25% with diffuse HK (previously 25-30% in 02/2018), mild AS, mildly dilated RV, moderately reduced RV function, mildly dilated RA, moderate TR, moderately increased PASP 550mg.  He is also noted to have frequent runs of NSVT, PVCs, and couplets on tele this morning. He is not clearly symptomatic with this.  Past Medical History:  Diagnosis Date  . Asthma    pt denies this hx on 11/02/2013  . BPH (benign prostatic hyperplasia)   . CAD (coronary artery disease)    a. s/p CABG 1997. b. s/p DES to SVG-PDA 2013. c. NSTEMI 08/2018 s/p DES to SVG-PDA; residual disease managed medically.  . Chronic combined systolic and diastolic CHF (congestive heart failure) ([facility]12/11/2011  . CKD (chronic kidney disease), stage II   .  Colonic polyp   . Dementia ([place])   . Exertional shortness of breath   . GERD (gastroesophageal reflux disease)   . Hearing loss   . History of pancreatitis    pt denies this hx on 11/02/2013  . Hyperlipidemia   . Hypertension   . Intrinsic asthma 06/18/2007   Qualifier: Diagnosis of  By: Dance CMA ([place]), Kim    . Ischemic cardiomyopathy 09/16/2012   EF 25%- improved to 45% 3/14  . Mild dilation of ascending aorta ([facility])   . Myocardial  infarction ([place]) 1997  . NSTEMI (non-ST elevated myocardial infarction) ([place]) 09/06/2018  . Obstructed, uropathy   . Pulmonary hypertension ([place])   . PVD (peripheral vascular disease) ([facility])    moderate bilat carotid disease  . Type II diabetes mellitus ([place])   . Vasovagal syncope     Past Surgical History:  Procedure Laterality Date  . CATARACT EXTRACTION W/ INTRAOCULAR LENS  IMPLANT, BILATERAL Bilateral ~ 1970  . CHOLECYSTECTOMY  2000's  . CORONARY ANGIOPLASTY WITH STENT PLACEMENT  Dec 2013   DES to SVG-CFX/PDA  . CORONARY ARTERY BYPASS GRAFT  1997   "CABG X 5"  . CORONARY STENT INTERVENTION N/A 09/08/2018   Procedure: CORONARY STENT INTERVENTION;  Surgeon: Lorretta Harp, MD;  Location: [facility];  Service: Cardiovascular;  Laterality: N/A;  . INGUINAL HERNIA REPAIR Bilateral ~ 1950  . LEFT HEART CATH AND CORS/GRAFTS ANGIOGRAPHY N/A 09/08/2018   Procedure: LEFT HEART CATH AND CORS/GRAFTS ANGIOGRAPHY;  Surgeon: Lorretta Harp, MD;  Location: [facility];  Service: Cardiovascular;  Laterality: N/A;  . LEFT HEART CATHETERIZATION WITH CORONARY/GRAFT ANGIOGRAM N/A 09/16/2012   Procedure: LEFT HEART CATHETERIZATION WITH Beatrix Fetters;  Surgeon: Lorretta Harp, MD;  [facility]: [facility];  Service: Cardiovascular;  Laterality: N/A;  . NASAL SEPTUM SURGERY  2007  . RETINAL DETACHMENT SURGERY Left ?1995  . SHOULDER OPEN ROTATOR CUFF REPAIR Right      Inpatient Medications: Scheduled Meds: . aspirin EC  162 mg Oral Daily  . clopidogrel  75 mg Oral Daily  . enoxaparin (LOVENOX) injection  40 mg Subcutaneous Q24H  . ezetimibe  10 mg Oral Daily  . finasteride  5 mg Oral QHS  . furosemide  40 mg Intravenous Q12H  . insulin glargine  12 Units Subcutaneous QHS  . isosorbide mononitrate  15 mg Oral Daily  . lisinopril  2.5 mg Oral QHS  . Melatonin  2 tablet Oral QHS  . mometasone-formoterol  2 puff Inhalation BID  . pantoprazole  40 mg Oral QAC breakfast    . predniSONE  20 mg Oral Q breakfast  . rosuvastatin  10 mg Oral QHS  . sodium chloride flush  3 mL Intravenous Q12H  . tamsulosin  0.4 mg Oral QPC supper   Continuous Infusions: . sodium chloride 10 mL/hr at 10/09/18 2000  . cefTRIAXone (ROCEPHIN)  IV 2 g (10/10/18 1001)   PRN Meds: sodium chloride, acetaminophen, albuterol, ondansetron (ZOFRAN) IV, sodium chloride flush  Home Meds: Prior to Admission medications   Medication Sig Start Date End Date Taking? Authorizing Provider  acetaminophen (TYLENOL) 500 MG tablet Take 1,000 mg by mouth every 6 (six) hours as needed (for pain or headaches).    Yes [provider]  albuterol (PROVENTIL HFA;VENTOLIN HFA) 108 (90 Base) MCG/ACT inhaler Inhale 2 puffs into the lungs every 6 (six) hours as needed for wheezing or shortness of breath. 08/29/18  Yes Colon Branch, MD  aspirin  EC 81 MG EC tablet Take 2 tablets (162 mg total) by mouth daily. 09/18/12  Yes Kilroy, Luke K, PA-C  carvedilol (COREG) 6.25 MG tablet Take 1 tablet (6.25 mg total) by mouth 2 (two) times daily with a meal. 09/10/18  Yes Kroeger, Daleen Snook M., PA-C  clopidogrel (PLAVIX) 75 MG tablet TAKE 1 TABLET DAILY WITH   BREAKFAST. KEEP OFFICE     VISIT. Patient taking differently: Take 75 mg by mouth daily.  06/30/18  Yes Lorretta Harp, MD  ezetimibe (ZETIA) 10 MG tablet TAKE 1 TABLET DAILY. KEEP  OFFICE VISIT Patient taking differently: Take 10 mg by mouth daily.  06/30/18  Yes Lorretta Harp, MD  finasteride (PROSCAR) 5 MG tablet Take 1 tablet (5 mg total) by mouth at bedtime. 10/06/18  Yes Paz, Alda Berthold, MD  Fluticasone-Salmeterol (ADVAIR) 100-50 MCG/DOSE AEPB Inhale 1 puff into the lungs 2 (two) times daily. 08/09/17  Yes Paz, Alda Berthold, MD  furosemide (LASIX) 40 MG tablet Take 1 tablet (40 mg total) by mouth daily. 09/22/18  Yes Kilroy, Luke K, PA-C  Grape Seed 100 MG CAPS Take 100 mg by mouth daily.    Yes [provider]  Insulin Glargine (BASAGLAR KWIKPEN) 100  UNIT/ML SOPN Inject 0.12 mLs (12 Units total) into the skin at bedtime. 09/24/18  Yes Paz, Alda Berthold, MD  isosorbide mononitrate (IMDUR) 30 MG 24 hr tablet Take 0.5 tablets (15 mg total) by mouth daily. 09/29/18  Yes Lorretta Harp, MD  lisinopril (PRINIVIL,ZESTRIL) 2.5 MG tablet Take 1 tablet (2.5 mg total) by mouth at bedtime. 09/22/18  Yes Kilroy, Luke K, PA-C  loperamide (IMODIUM A-D) 2 MG tablet Take 2 mg by mouth 4 (four) times daily as needed for diarrhea or loose stools.   Yes [provider]  Melatonin 3 MG TABS Take 2 tablets by mouth at bedtime.   Yes [provider]  metFORMIN (GLUCOPHAGE) 850 MG tablet Take 850 mg by mouth 2 (two) times daily with a meal.   Yes [provider]  nitroGLYCERIN (NITROSTAT) 0.4 MG SL tablet Place 1 tablet (0.4 mg total) under the tongue every 5 (five) minutes x 3 doses as needed for chest pain. 02/04/18  Yes Paz, Alda Berthold, MD  pantoprazole (PROTONIX) 40 MG tablet TAKE 1 TABLET DAILY. KEEP  OFFICE VISIT. Patient taking differently: Take 40 mg by mouth daily before breakfast.  06/30/18  Yes Lorretta Harp, MD  rosuvastatin (CRESTOR) 10 MG tablet TAKE 1 TABLET DAILY. Patient taking differently: Take 10 mg by mouth daily.  06/30/18  Yes Lorretta Harp, MD  tamsulosin (FLOMAX) 0.4 MG CAPS capsule Take 1 capsule (0.4 mg total) by mouth daily after supper. 09/24/18  Yes Paz, Alda Berthold, MD  XIIDRA 5 % SOLN Place 1 drop into the left eye 2 (two) times daily. 08/27/18  Yes [provider]  Alcohol Swabs (ALCOHOL PREP) 70 % PADS Reported on 04/30/2016 02/23/14   [provider]  Blood Glucose Monitoring Suppl (BLOOD GLUCOSE METER) kit Use as instructed 04/08/13   Norins, Heinz Knuckles, MD  glucose blood test strip Use to test blood sugars twice daily 02/01/14   Norins, Heinz Knuckles, MD  Insulin Pen Needle 32G X 4 MM MISC To use w/ Basaglar 11/29/15   Colon Branch, MD  Lancet Devices (ADJUSTABLE LANCING DEVICE) MISC Reported on 04/30/2016  02/23/14   [provider]    Allergies:    Allergies  Allergen Reactions  . Ativan [Lorazepam]  Other (See Comments)    "Made him crazy"  . Colesevelam Other (See Comments)    Pancreatitis (??)  . Ezetimibe-Simvastatin Other (See Comments)    Vytorin caused pancreatitis    Social History:   Social History   Socioeconomic History  . Marital status: Married    Spouse name: Not on file  . Number of children: 1  . Years of education: Not on file  . Highest education level: Not on file  Occupational History  . 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 Financial resource strain: Not on file  . Food insecurity:    Worry: Not on file    Inability: Not on file  . Transportation needs:    Medical: Not on file    Non-medical: Not on file  Tobacco Use  . Smoking status: Never Smoker  . Smokeless tobacco: Never Used  . Tobacco comment: 11/02/2013 "smoked cigars when I was 18 or so; didn't smoke many"  Substance and Sexual Activity  . Alcohol use: Yes    Comment: rare  . Drug use: No  . Sexual activity: Not Currently  Lifestyle  . Physical activity:    Days per week: Not on file    Minutes per session: Not on file  . Stress: Not on file  Relationships  . Social connections:    Talks on phone: Not on file    Gets together: Not on file    Attends religious service: Not on file    Active member of club or organization: Not on file    Attends meetings of clubs or organizations: Not on file    Relationship status: Not on file  . Intimate partner violence:    Fear of current or ex partner: Not on file    Emotionally abused: Not on file    Physically abused: Not on file    Forced sexual activity: Not on file  Other Topics Concern  . 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 There is no history of Colon cancer.  ROS:  Please see the history of present illness. All other ROS reviewed and negative.     Physical Exam/Data:   Vitals:   10/10/18 0455 10/10/18 0550 10/10/18 0716 10/10/18 0923  BP: 109/76 (!) 145/85 109/89   Pulse: (!) 155 (!) 149 91   Resp:      Temp:   98.3 F (36.8 C)   TempSrc:   Oral   SpO2: 100% 97% 95% 91%  Weight:      Height:        Intake/Output Summary (Last 24 hours) at 10/10/2018 1125 Last data filed at 10/10/2018 0917 Gross per 24 hour  Intake 1713.38 ml  Output 1775 ml  Net -61.62 ml   Filed Weights   10/09/18 1414 10/09/18 1520 10/10/18 0358  Weight: 73.5 kg 70.8 kg 71.7 kg   Body mass index is 25.5 kg/m.  General: Elderly WM in no acute distress. Head: Normocephalic, atraumatic, sclera non-icteric, no xanthomas, nares are without discharge.  Neck: Negative for carotid bruits. JVD not elevated. Lungs: Coarse and crackly throughout with scattered rhonchi. No wheezing. Mild increase WOB with recumbency  Heart: Irregular due to frequent ectopy with S1 S2. No murmurs, rubs, or gallops appreciated. Abdomen: Soft, non-tender, non-distended with normoactive bowel sounds. No hepatomegaly. No rebound/guarding. No obvious abdominal masses. Msk:  Strength and tone appear normal for age. Extremities: No clubbing or cyanosis. No edema.  Distal pedal pulses are 2+ and equal bilaterally. Neuro: Alert and oriented to self but not place and time. Hard of hearing. No facial asymmetry. Moves all extremities spontaneously. Psych: Defers to wife frequently, but appears in good spirits  EKG:  The EKG was personally reviewed and demonstrates: 1) NSR 87bpm pulm disease pattern, nonspecific ST-T changes 2) NSR with ventricular bigeminy (listed as sinus brady but likely due to pseudobrady), nonspecific ST-T changes 3) sustained VT 148bpm with RBBB  morphology  Relevant CV Studies: Study Conclusions  - Left ventricle: Wall thickness was increased in a pattern of mild   LVH. Systolic function was severely reduced. The estimated   ejection fraction was in the range of 20% to 25%. Diffuse   hypokinesis. - Aortic valve: There was mild stenosis. There was mild   regurgitation. Valve area (VTI): 1.76 cm^2. Valve area (Vmax):   1.7 cm^2. Valve area (Vmean): 1.62 cm^2. - Left atrium: The atrium was severely dilated. - Right ventricle: The cavity size was mildly dilated. Systolic   function was moderately reduced. - Right atrium: The atrium was mildly dilated. - Tricuspid valve: There was moderate regurgitation. - Pulmonary arteries: Systolic pressure was moderately increased.   PA peak pressure: 52 mm Hg (S).  Laboratory Data:  Chemistry Recent Labs  Lab 10/09/18 1010 10/10/18 0604  NA 134* 133*  K 3.3* 3.8  CL 95* 94*  CO2 27 25  GLUCOSE 103* 224*  BUN 18 28*  CREATININE 1.08 1.13  CALCIUM 8.9 8.4*  GFRNONAA >60 57*  GFRAA >60 >60  ANIONGAP 12 14    Recent Labs  Lab 10/09/18 1010  PROT 6.4*  ALBUMIN 3.7  AST 31  ALT 22  ALKPHOS 48  BILITOT 1.4*   Hematology Recent Labs  Lab 10/09/18 1010 10/10/18 0604  WBC 20.1* 24.5*  RBC 3.96* 3.63*  HGB 11.8* 10.8*  HCT 35.3* 32.3*  MCV 89.1 89.0  MCH 29.8 29.8  MCHC 33.4 33.4  RDW 14.2 14.5  PLT 186 180   Cardiac EnzymesNo results for input(s): TROPONINI in the last 168 hours. No results for input(s): TROPIPOC in the last 168 hours.  BNP Recent Labs  Lab 10/09/18 1003  BNP >4,500.0*    DDimer No results for input(s): DDIMER in the last 168 hours.  Radiology/Studies:  Dg Chest 2 View  Result Date: 10/09/2018 CLINICAL DATA:  Congestion, cough, shortness of breath EXAM: CHEST - 2 VIEW COMPARISON:  Portable chest x-ray of 09/06/2017 FINDINGS: There is opacity posteriorly at the medial left lung base consistent with left lower lobe pneumonia. A small left  pleural effusion cannot be excluded. The right lung appears clear. Cardiomegaly is stable. Median sternotomy sutures are noted from prior CABG. There are degenerative changes in the mid to lower thoracic spine. IMPRESSION: 1. Left lower lobe opacity consistent with pneumonia. 2. Stable cardiomegaly. Electronically Signed   By: Ivar Drape M.D.   On: 10/09/2018 10:32   Ct Head Wo Contrast  Result Date: 10/09/2018 CLINICAL DATA:  Altered level of consciousness. EXAM: CT HEAD WITHOUT CONTRAST TECHNIQUE: Contiguous axial images were obtained from the base of the skull through the vertex without intravenous contrast. COMPARISON:  11/21/2014 FINDINGS: Brain: No evidence of acute  infarction, hemorrhage, hydrocephalus, extra-axial collection or mass lesion/mass effect. There is marked diffuse low-attenuation within the subcortical and periventricular white matter compatible with chronic microvascular disease. Chronic brainstem lacunar infarct identified. Prominence of the sulci and ventricles identified compatible with brain atrophy. Vascular: No hyperdense vessel or unexpected calcification. Skull: Normal. Negative for fracture or focal lesion. Sinuses/Orbits: There are postoperative changes from bilateral ethmoidectomy and median antrectomy. Chronic mucosal thickening involving the frontal sinuses and sphenoid sinus and bilateral maxillary sinuses noted. The mastoid air cells appear clear. Other: None IMPRESSION: 1. Chronic small vessel ischemic disease and brain atrophy. No acute intracranial abnormality noted. 2. Previous sinus surgery with chronic sinus inflammation. Electronically Signed   By: Kerby Moors M.D.   On: 10/09/2018 11:05    Assessment and Plan:   1. Acute respiratory failure and encephalopathy in setting of HCAP (Also likely component of HF) - on abx, appears to be clinically improving.  2. Acute on chronic combined CHF - body weight has steadily declined over the last few years so dry  weight is a moving target. He has no edema on exam but BNP was markedly elevated and he seems to be tolerating diuresis well. F/u BUN/Cr in AM and consider scaling back diuretic if further rise. BB on hold due to hypotension. Hold low dose ACEI in case BP is needed to add back BB.  3. Runs of NSVT and PVCs - follows morphology of PVCs. Mg 1.1 likely contributing -> primary team is addressing this. Per d/w MD, plan initiation of amiodarone. Not a device candidate given advanced age and DNR status. Consider resumption of BB as we are able.  4. CKD stage II - follow with above.  5. CAD s/p history of CABG/PCI - no acute anginal complaints. He remains on ASA/Plavix. Consider decreasing aspirin to 59m unless he is on higher dose for other reasons.  For questions or updates, please contact CK[facility]manPlease consult [email or web address] for contact info under Cardiology/STEMI.    Signed, DCharlie Pitter PA-C  10/10/2018 11:25 AM

## 2018-10-10 NOTE — Progress Notes (Deleted)
Mg ordered.

## 2018-10-10 NOTE — Progress Notes (Signed)
Mg 1.1 on labs, paged Dr Isidoro Donning, also advised of pvcs and run of 20 this am

## 2018-10-11 DIAGNOSIS — E118 Type 2 diabetes mellitus with unspecified complications: Secondary | ICD-10-CM

## 2018-10-11 DIAGNOSIS — I5043 Acute on chronic combined systolic (congestive) and diastolic (congestive) heart failure: Secondary | ICD-10-CM

## 2018-10-11 DIAGNOSIS — Z794 Long term (current) use of insulin: Secondary | ICD-10-CM

## 2018-10-11 DIAGNOSIS — J9601 Acute respiratory failure with hypoxia: Secondary | ICD-10-CM

## 2018-10-11 DIAGNOSIS — I10 Essential (primary) hypertension: Secondary | ICD-10-CM

## 2018-10-11 DIAGNOSIS — I5042 Chronic combined systolic (congestive) and diastolic (congestive) heart failure: Secondary | ICD-10-CM

## 2018-10-11 DIAGNOSIS — I25810 Atherosclerosis of coronary artery bypass graft(s) without angina pectoris: Secondary | ICD-10-CM

## 2018-10-11 DIAGNOSIS — I472 Ventricular tachycardia: Secondary | ICD-10-CM

## 2018-10-11 DIAGNOSIS — F0150 Vascular dementia without behavioral disturbance: Secondary | ICD-10-CM

## 2018-10-11 LAB — CBC
HCT: 32.8 % — ABNORMAL LOW (ref 39.0–52.0)
Hemoglobin: 10.9 g/dL — ABNORMAL LOW (ref 13.0–17.0)
MCH: 29.5 pg (ref 26.0–34.0)
MCHC: 33.2 g/dL (ref 30.0–36.0)
MCV: 88.9 fL (ref 80.0–100.0)
Platelets: 191 10*3/uL (ref 150–400)
RBC: 3.69 MIL/uL — ABNORMAL LOW (ref 4.22–5.81)
RDW: 14.6 % (ref 11.5–15.5)
WBC: 21 10*3/uL — ABNORMAL HIGH (ref 4.0–10.5)
nRBC: 0 % (ref 0.0–0.2)

## 2018-10-11 LAB — GLUCOSE, CAPILLARY
Glucose-Capillary: 286 mg/dL — ABNORMAL HIGH (ref 70–99)
Glucose-Capillary: 291 mg/dL — ABNORMAL HIGH (ref 70–99)

## 2018-10-11 LAB — BASIC METABOLIC PANEL
Anion gap: 14 (ref 5–15)
BUN: 34 mg/dL — ABNORMAL HIGH (ref 8–23)
CO2: 25 mmol/L (ref 22–32)
Calcium: 8.9 mg/dL (ref 8.9–10.3)
Chloride: 93 mmol/L — ABNORMAL LOW (ref 98–111)
Creatinine, Ser: 1.05 mg/dL (ref 0.61–1.24)
GFR calc Af Amer: >60 mL/min (ref >60)
GFR calc non Af Amer: >60 mL/min (ref >60)
Glucose, Bld: 214 mg/dL — ABNORMAL HIGH (ref 70–99)
Potassium: 3.4 mmol/L — ABNORMAL LOW (ref 3.5–5.1)
Sodium: 132 mmol/L — ABNORMAL LOW (ref 135–145)

## 2018-10-11 LAB — MAGNESIUM: Magnesium: 1.9 mg/dL (ref 1.7–2.4)

## 2018-10-11 MED ORDER — ASPIRIN EC 81 MG PO TBEC
81.0000 mg | DELAYED_RELEASE_TABLET | Freq: Every day | ORAL | Status: DC
  Administered 2018-10-12 – 2018-10-14 (×3): 81 mg via ORAL
  Filled 2018-10-11 (×3): qty 1

## 2018-10-11 MED ORDER — INSULIN ASPART 100 UNIT/ML ~~LOC~~ SOLN
0.0000 [IU] | Freq: Three times a day (TID) | SUBCUTANEOUS | Status: DC
  Administered 2018-10-11: 5 [IU] via SUBCUTANEOUS
  Administered 2018-10-12: 1 [IU] via SUBCUTANEOUS
  Administered 2018-10-12: 5 [IU] via SUBCUTANEOUS

## 2018-10-11 MED ORDER — POTASSIUM CHLORIDE CRYS ER 20 MEQ PO TBCR
20.0000 meq | EXTENDED_RELEASE_TABLET | Freq: Every day | ORAL | Status: DC
  Administered 2018-10-12: 20 meq via ORAL
  Filled 2018-10-11: qty 1

## 2018-10-11 MED ORDER — CARVEDILOL 6.25 MG PO TABS
6.2500 mg | ORAL_TABLET | Freq: Two times a day (BID) | ORAL | Status: DC
  Administered 2018-10-11 – 2018-10-14 (×6): 6.25 mg via ORAL
  Filled 2018-10-11 (×6): qty 1

## 2018-10-11 MED ORDER — POTASSIUM CHLORIDE CRYS ER 20 MEQ PO TBCR
40.0000 meq | EXTENDED_RELEASE_TABLET | Freq: Once | ORAL | Status: AC
  Administered 2018-10-11: 40 meq via ORAL
  Filled 2018-10-11: qty 2

## 2018-10-11 NOTE — Progress Notes (Signed)
PROGRESS NOTE    Enid Baas.  FSE:[id number] DOB: June 11, 1926 DOA: 10/09/2018 PCP: Wanda Plump, MD  Brief Narrative:  Patient is a 84 year old male with history of diabetes, CAD status post CABG and stent, recent NSTEMI in 08/2018, chronic systolic CHF with reduced EF 20-25%, hypertension, hyperlipidemia presented to ED with shortness of breath, altered mental status.  Patient was discharged from the hospital and was recovering well, had some urinary symptoms and had received ciprofloxacin for possible prostatitis by Dr. Darrin Nipper on December 11.  In the past 1 week, patient was having cough and congestion, went to urgent care and was diagnosed with acute laryngitis.  Night before the admission, patient became very confused, not recognizing his family members.  He had a fall that night but did not seem to injure himself.  He was hypoxic, with O2 sats in 80s when EMS arrived.  Chest x-ray showed left lung pneumonia, patient was admitted for further work-up. Assessment & Plan:   Principal Problem:   Acute respiratory failure with hypoxia (HCC) Active Problems:   Dyslipidemia, goal LDL below 70   BPH (benign prostatic hyperplasia)   Insulin dependent diabetes mellitus with complications (HCC)   Sleep apnea- C-pap   Carotid artery disease (HCC)   Chronic combined systolic and diastolic CHF (congestive heart failure) (HCC)   Essential hypertension   Dementia (HCC)   Acute respiratory failure with hypoxia/sepsis from left lower lobe pneumonia/healthcare associated pneumonia/streptococcal pneumonia Improving, patient was started on IV vancomycin and cefepime later on was antibiotics for transition to IV Rocephin. Patient continues to require nasal cannula oxygen to keep sats greater than 90%. Blood cultures have been negative so far but urine strep antigen was positive.   Mild acute on chronic combined systolic and diastolic heart failure Echocardiogram showed left ventricular ejection  fraction at 25% with diffuse hypokinesis with grade 2 diastolic dysfunction. Cardiology consulted and patient is currently on Lasix 40 mg twice daily.  Continue with strict intake and output, daily weights and monitor renal parameters and electrolytes while on IV Lasix.   Essential hypertension Blood pressure parameters better today He was restarted on Coreg 6.25 mg twice daily along with Imdur 15 mg daily    History of coronary artery disease status post CABG Patient denies any chest pain at this time  Continue with aspirin 81 mg, Plavix 75 mg daily   Insulin-dependent diabetes mellitus CBG (last 3)  Recent Labs    10/09/18 1016  GLUCAP 94   Continue with Lantus of 12 units and start sliding scale insulin added.    DVT prophylaxis: Lovenox Code Status: DNR Family Communication: Family at bedside today   disposition Plan: Pending clinical improvement  Consultants:  Cardiology Procedures: Echocardiogram Antimicrobials: On IV Rocephin to complete the course  Subjective: Patient reports his breathing is the same as yesterday.  He denies any chest pain or shortness of breath.  He continues to require nasal cannula oxygen to keep sats greater than 90%.  Objective: Vitals:   10/10/18 1944 10/10/18 2205 10/11/18 0537 10/11/18 1144  BP:   122/73 (!) 118/55  Pulse: 96  72 71  Resp:   18 18  Temp:   98 F (36.7 C) 98 F (36.7 C)  TempSrc:    Oral  SpO2:  94% 92% 99%  Weight:   70.9 kg   Height:        Intake/Output Summary (Last 24 hours) at 1July 25, 202019 1225 Last data filed at 1July 25, 202019 1100 Gross per 24  hour  Intake 830.58 ml  Output 1375 ml  Net -544.42 ml   Filed Weights   10/09/18 1520 10/10/18 0358 10/11/18 0537  Weight: 70.8 kg 71.7 kg 70.9 kg    Examination:  General exam: Appears calm and comfortable  Respiratory system: Diminished at bases, tachypnea on talking. Cardiovascular system: S1 & S2 heard, RRR.  JVD present  gastrointestinal system:  Abdomen is nondistended, soft and nontender. No organomegaly or masses felt. Normal bowel sounds heard. Central nervous system: Alert and oriented.  Grossly nonfocal extremities: Trace pedal edema present Skin: No rashes, lesions or ulcers Psychiatry: Mood & affect appropriate.     Data Reviewed: I have personally reviewed following labs and imaging studies  CBC: Recent Labs  Lab 10/09/18 1010 10/10/18 0604 10/11/18 0926  WBC 20.1* 24.5* 21.0*  NEUTROABS 18.1* 23.0*  --   HGB 11.8* 10.8* 10.9*  HCT 35.3* 32.3* 32.8*  MCV 89.1 89.0 88.9  PLT 186 180 191   Basic Metabolic Panel: Recent Labs  Lab 10/09/18 1010 10/10/18 0604 10/10/18 1118 10/11/18 0926  NA 134* 133*  --  132*  K 3.3* 3.8  --  3.4*  CL 95* 94*  --  93*  CO2 27 25  --  25  GLUCOSE 103* 224*  --  214*  BUN 18 28*  --  34*  CREATININE 1.08 1.13  --  1.05  CALCIUM 8.9 8.4*  --  8.9  MG  --  1.1* 2.2 1.9   GFR: Estimated Creatinine Clearance: 42.2 mL/min (by C-G formula based on SCr of 1.05 mg/dL). Liver Function Tests: Recent Labs  Lab 10/09/18 1010  AST 31  ALT 22  ALKPHOS 48  BILITOT 1.4*  PROT 6.4*  ALBUMIN 3.7   No results for input(s): LIPASE, AMYLASE in the last 168 hours. No results for input(s): AMMONIA in the last 168 hours. Coagulation Profile: No results for input(s): INR, PROTIME in the last 168 hours. Cardiac Enzymes: No results for input(s): CKTOTAL, CKMB, CKMBINDEX, TROPONINI in the last 168 hours. BNP (last 3 results) No results for input(s): PROBNP in the last 8760 hours. HbA1C: No results for input(s): HGBA1C in the last 72 hours. CBG: Recent Labs  Lab 10/09/18 1016  GLUCAP 94   Lipid Profile: No results for input(s): CHOL, HDL, LDLCALC, TRIG, CHOLHDL, LDLDIRECT in the last 72 hours. Thyroid Function Tests: No results for input(s): TSH, T4TOTAL, FREET4, T3FREE, THYROIDAB in the last 72 hours. Anemia Panel: No results for input(s): VITAMINB12, FOLATE, FERRITIN, TIBC,  IRON, RETICCTPCT in the last 72 hours. Sepsis Labs: Recent Labs  Lab 10/09/18 1324 10/09/18 1616 10/09/18 2020  PROCALCITON  --   --  15.91  LATICACIDVEN 1.95* 2.9* 2.7*    Recent Results (from the past 240 hour(s))  Culture, blood (routine x 2) Call MD if unable to obtain prior to antibiotics being given     Status: None (Preliminary result)   Collection Time: 10/09/18  4:16 PM  Result Value Ref Range Status   Specimen Description BLOOD LEFT WRIST  Final   Special Requests   Final    BOTTLES DRAWN AEROBIC ONLY Blood Culture adequate volume   Culture   Final    NO GROWTH 2 DAYS Performed at [facility], [address], [place], [place] [address]    Report Status PENDING  Incomplete  Culture, blood (routine x 2) Call MD if unable to obtain prior to antibiotics being given     Status: None (Preliminary result)  Collection Time: 10/09/18  4:33 PM  Result Value Ref Range Status   Specimen Description BLOOD LEFT HAND  Final   Special Requests   Final    BOTTLES DRAWN AEROBIC ONLY Blood Culture adequate volume   Culture   Final    NO GROWTH 2 DAYS Performed at [facility], [address] [place], [place] [address]    Report Status PENDING  Incomplete  Culture, sputum-assessment     Status: None   Collection Time: 10/09/18  7:20 PM  Result Value Ref Range Status   Specimen Description EXPECTORATED SPUTUM  Final   Special Requests NONE  Final   Sputum evaluation   Final    THIS SPECIMEN IS ACCEPTABLE FOR SPUTUM CULTURE Performed at [facility], [address] [place], [place] [address]    Report Status 10/09/2018 FINAL  Final  Culture, respiratory     Status: None (Preliminary result)   Collection Time: 10/09/18  7:20 PM  Result Value Ref Range Status   Specimen Description EXPECTORATED SPUTUM  Final   Special Requests NONE Reflexed from [id number]  Final   Gram Stain   Final    ABUNDANT WBC PRESENT, PREDOMINANTLY PMN MODERATE GRAM POSITIVE COCCI RARE  GRAM POSITIVE RODS    Culture   Final    FEW Consistent with normal respiratory flora. Performed at [facility], [address] [place], [place] [address]    Report Status PENDING  Incomplete         Radiology Studies: No results found.      Scheduled Meds: . [START ON 10/12/2018] aspirin EC  81 mg Oral Daily  . carvedilol  6.25 mg Oral BID WC  . clopidogrel  75 mg Oral Daily  . enoxaparin (LOVENOX) injection  40 mg Subcutaneous Q24H  . ezetimibe  10 mg Oral Daily  . finasteride  5 mg Oral QHS  . furosemide  40 mg Intravenous Q12H  . insulin glargine  12 Units Subcutaneous QHS  . isosorbide mononitrate  15 mg Oral Daily  . Melatonin  2 tablet Oral QHS  . mometasone-formoterol  2 puff Inhalation BID  . pantoprazole  40 mg Oral QAC breakfast  . [START ON 10/12/2018] potassium chloride  20 mEq Oral Daily  . potassium chloride  40 mEq Oral Once  . predniSONE  20 mg Oral Q breakfast  . rosuvastatin  10 mg Oral QHS  . sodium chloride flush  3 mL Intravenous Q12H  . tamsulosin  0.4 mg Oral QPC supper   Continuous Infusions: . sodium chloride 10 mL/hr at 10/09/18 2000  . cefTRIAXone (ROCEPHIN)  IV Stopped (10/10/18 1030)     LOS: 2 days    Time spent: 30 minutes    Kathlen ModyVijaya Worth Kober, MD [facility] Pager [phone]  If 7PM-7AM, please contact night-coverage [email or web address] Password [email or web address] 10/11/2018, 12:25 PM

## 2018-10-11 NOTE — Plan of Care (Signed)
°  Problem: Education: °Goal: Ability to demonstrate management of disease process will improve °Outcome: Progressing °Goal: Ability to verbalize understanding of medication therapies will improve °Outcome: Progressing °Goal: Individualized Educational Video(s) °Outcome: Progressing °  °Problem: Activity: °Goal: Capacity to carry out activities will improve °Outcome: Progressing °  °Problem: Cardiac: °Goal: Ability to achieve and maintain adequate cardiopulmonary perfusion will improve °Outcome: Progressing °  °Problem: Activity: °Goal: Ability to tolerate increased activity will improve °Outcome: Progressing °  °Problem: Clinical Measurements: °Goal: Ability to maintain a body temperature in the normal range will improve °Outcome: Progressing °  °Problem: Respiratory: °Goal: Ability to maintain adequate ventilation will improve °Outcome: Progressing °Goal: Ability to maintain a clear airway will improve °Outcome: Progressing °  °

## 2018-10-11 NOTE — Progress Notes (Signed)
Progress Note  Patient Name: [name]. Date of Encounter: 106-03-2018  Primary Cardiologist: [name], MD   Subjective   Feeling much better according to his family.  Although he still has his usual memory problems, he is much less confused. Has a wet sounding cough, but no pleuritic pain.  Denies angina. According to in/out, net diuresis about 700 mL.  If admission weight was accurate he has lost about 2.5 kg, but this is uncertain.  Compared to first weight recorded after admission to the floor there has been no net change. No further ventricular arrhythmia on monitor. Slight downward trend in WBC.  Inpatient Medications    Scheduled Meds: . aspirin EC  162 mg Oral Daily  . clopidogrel  75 mg Oral Daily  . enoxaparin (LOVENOX) injection  40 mg Subcutaneous Q24H  . ezetimibe  10 mg Oral Daily  . finasteride  5 mg Oral QHS  . furosemide  40 mg Intravenous Q12H  . insulin glargine  12 Units Subcutaneous QHS  . isosorbide mononitrate  15 mg Oral Daily  . Melatonin  2 tablet Oral QHS  . mometasone-formoterol  2 puff Inhalation BID  . pantoprazole  40 mg Oral QAC breakfast  . predniSONE  20 mg Oral Q breakfast  . rosuvastatin  10 mg Oral QHS  . sodium chloride flush  3 mL Intravenous Q12H  . tamsulosin  0.4 mg Oral QPC supper   Continuous Infusions: . sodium chloride 10 mL/hr at 10/09/18 2000  . amiodarone 30 mg/hr (10/11/18 0951)  . cefTRIAXone (ROCEPHIN)  IV Stopped (10/10/18 1030)   PRN Meds: sodium chloride, acetaminophen, albuterol, ondansetron (ZOFRAN) IV, sodium chloride flush   Vital Signs    Vitals:   10/10/18 1936 10/10/18 1944 10/10/18 2205 10/11/18 0537  BP: (!) 115/44   122/73  Pulse: (!) 40 96  72  Resp:    18  Temp: 97.8 F (36.6 C)   98 F (36.7 C)  TempSrc: Oral     SpO2:   94% 92%  Weight:    70.9 kg  Height:        Intake/Output Summary (Last 24 hours) at 106-03-2018 1007 Last data filed at 106-03-2018 0902 Gross per 24 hour    Intake 607.24 ml  Output 1375 ml  Net -767.76 ml   Filed Weights   10/09/18 1520 10/10/18 0358 10/11/18 0537  Weight: 70.8 kg 71.7 kg 70.9 kg    Telemetry    Sinus rhythm, no further VT- Personally Reviewed  ECG    Yesterday's tracing shows wide-complex tachycardia with a right bundle branch block morphology- Personally Reviewed  Physical Exam  Alert, oriented x2, conversant GEN: No acute distress.   Neck:  5-6 cm JVD Cardiac: RRR, no murmurs, rubs, or gallops.  Respiratory: Clear to auscultation bilaterally. GI: Soft, nontender, non-distended  MS: No edema; No deformity. Neuro:  Nonfocal  Psych: Normal affect   Labs    Chemistry Recent Labs  Lab 10/09/18 1010 10/10/18 0604 10/11/18 0926  NA 134* 133* 132*  K 3.3* 3.8 3.4*  CL 95* 94* 93*  CO2 27 25 25   GLUCOSE 103* 224* 214*  BUN 18 28* 34*  CREATININE 1.08 1.13 1.05  CALCIUM 8.9 8.4* 8.9  PROT 6.4*  --   --   ALBUMIN 3.7  --   --   AST 31  --   --   ALT 22  --   --   ALKPHOS 48  --   --  BILITOT 1.4*  --   --   GFRNONAA >60 57* >60  GFRAA >60 >60 >60  ANIONGAP 12 14 14      Hematology Recent Labs  Lab 10/09/18 1010 10/10/18 0604 10/11/18 0926  WBC 20.1* 24.5* 21.0*  RBC 3.96* 3.63* 3.69*  HGB 11.8* 10.8* 10.9*  HCT 35.3* 32.3* 32.8*  MCV 89.1 89.0 88.9  MCH 29.8 29.8 29.5  MCHC 33.4 33.4 33.2  RDW 14.2 14.5 14.6  PLT 186 180 191    Cardiac EnzymesNo results for input(s): TROPONINI in the last 168 hours. No results for input(s): TROPIPOC in the last 168 hours.   BNP Recent Labs  Lab 10/09/18 1003  BNP >4,500.0*     DDimer No results for input(s): DDIMER in the last 168 hours.   Radiology    Dg Chest 2 View  Result Date: 10/09/2018 CLINICAL DATA:  Congestion, cough, shortness of breath EXAM: CHEST - 2 VIEW COMPARISON:  Portable chest x-ray of 09/06/2017 FINDINGS: There is opacity posteriorly at the medial left lung base consistent with left lower lobe pneumonia. A small left  pleural effusion cannot be excluded. The right lung appears clear. Cardiomegaly is stable. Median sternotomy sutures are noted from prior CABG. There are degenerative changes in the mid to lower thoracic spine. IMPRESSION: 1. Left lower lobe opacity consistent with pneumonia. 2. Stable cardiomegaly. Electronically Signed   By: Dwyane Dee M.D.   On: 10/09/2018 10:32   Ct Head Wo Contrast  Result Date: 10/09/2018 CLINICAL DATA:  Altered level of consciousness. EXAM: CT HEAD WITHOUT CONTRAST TECHNIQUE: Contiguous axial images were obtained from the base of the skull through the vertex without intravenous contrast. COMPARISON:  11/21/2014 FINDINGS: Brain: No evidence of acute infarction, hemorrhage, hydrocephalus, extra-axial collection or mass lesion/mass effect. There is marked diffuse low-attenuation within the subcortical and periventricular white matter compatible with chronic microvascular disease. Chronic brainstem lacunar infarct identified. Prominence of the sulci and ventricles identified compatible with brain atrophy. Vascular: No hyperdense vessel or unexpected calcification. Skull: Normal. Negative for fracture or focal lesion. Sinuses/Orbits: There are postoperative changes from bilateral ethmoidectomy and median antrectomy. Chronic mucosal thickening involving the frontal sinuses and sphenoid sinus and bilateral maxillary sinuses noted. The mastoid air cells appear clear. Other: None IMPRESSION: 1. Chronic small vessel ischemic disease and brain atrophy. No acute intracranial abnormality noted. 2. Previous sinus surgery with chronic sinus inflammation. Electronically Signed   By: Signa Kell M.D.   On: 10/09/2018 11:05    Cardiac Studies   Echo 10/09/2018  - Left ventricle: Wall thickness was increased in a pattern of mild LVH. Systolic function was severely reduced. The estimated ejection fraction was in the range of 20% to 25%. Diffuse hypokinesis. - Aortic valve: There was  mild stenosis. There was mild regurgitation. Valve area (VTI): 1.76 cm^2. Valve area (Vmax): 1.7 cm^2. Valve area (Vmean): 1.62 cm^2. - Left atrium: The atrium was severely dilated. - Right ventricle: The cavity size was mildly dilated. Systolic function was moderately reduced. - Right atrium: The atrium was mildly dilated. - Tricuspid valve: There was moderate regurgitation. - Pulmonary arteries: Systolic pressure was moderately increased. PA peak pressure: 52 mm Hg (S).  Cardiac cath 09/08/2018 IMPRESSION: Successful RCA SVG PCI drug-eluting stenting using 2 overlapping synergy drug-eluting stents postdilated to 3.3 mm using spider distal protection.  Patient has residual ramus intermedius branch SVG midshaft high-grade stenosis which will need to be intervened on in a staged fashion after his renal function is carefully monitored.  I am going to continue his Angiomax full dose for 4 hours since he really was not on antiplatelets prior to admission.  He will be gently hydrated and his blood work will be reassessed in the morning.  He left the lab in stable condition.   Patient Profile     84 y.o. male with multiple medical problems and acute on chronic systolic congestive heart failure with worsening LVEF in the setting of pneumococcal pneumonia, CAD S/P CABG, recent non-ST elevation MI and DES to SVG-RCA-PDA in November 2019 nonsustained VT moderate pulmonary artery hypertension, hypercholesterolemia, hypertension, type 2 diabetes mellitus, asthma, PAD, BPH, CKD stage II, dementia, history of vasovagal syncope April 2018  Assessment & Plan    1. Acute respiratory failure and encephalopathy: Markedly improved.  There was clearly a component of heart failure (he was markedly orthopneic), but the triggering event appears to be pneumococcal pneumonia. 2. Acute on chronic combined CHF - body weight has steadily declined over the last few years so dry weight is a moving target.  I am not  entirely confident that in/out have been recorded accurately, but he does have an indwelling Foley catheter now.  Continue diuretics.  Beta-blocker and ACE inhibitor were on hold during diuresis due to low blood pressure.  Blood pressure has improved, will restart the beta-blocker first. 3. NSVT: Resolved, stop intravenous amiodarone.  Resume beta-blockers. 4. CKD stage II: Parameters improving with diuresis. 5. CAD s/p history of CABG/PCI: no angina.     For questions or updates, please contact [facility] Please consult [email or web address] for contact info under        Signed, Thurmon FairMihai Ieisha Gao, MD  10/11/2018, 10:07 AM

## 2018-10-11 NOTE — Progress Notes (Signed)
Patient states he doesn't wear CPAP, Rn asked about wearing oxygen and patient says he's not wearing it right now. O2 sats WNL, will continue to monitor.

## 2018-10-12 ENCOUNTER — Other Ambulatory Visit: Payer: Self-pay | Admitting: Internal Medicine

## 2018-10-12 DIAGNOSIS — E785 Hyperlipidemia, unspecified: Secondary | ICD-10-CM

## 2018-10-12 LAB — GLUCOSE, CAPILLARY
Glucose-Capillary: 258 mg/dL — ABNORMAL HIGH (ref 70–99)
Glucose-Capillary: 146 mg/dL — ABNORMAL HIGH (ref 70–99)
Glucose-Capillary: 237 mg/dL — ABNORMAL HIGH (ref 70–99)
Glucose-Capillary: 228 mg/dL — ABNORMAL HIGH (ref 70–99)

## 2018-10-12 LAB — CULTURE, RESPIRATORY W GRAM STAIN

## 2018-10-12 LAB — BASIC METABOLIC PANEL
Anion gap: 14 (ref 5–15)
BUN: 30 mg/dL — ABNORMAL HIGH (ref 8–23)
CALCIUM: 8.6 mg/dL — AB (ref 8.9–10.3)
CO2: 29 mmol/L (ref 22–32)
CREATININE: 1.07 mg/dL (ref 0.61–1.24)
Chloride: 93 mmol/L — ABNORMAL LOW (ref 98–111)
GFR calc Af Amer: >60 mL/min (ref >60)
Glucose, Bld: 178 mg/dL — ABNORMAL HIGH (ref 70–99)
Potassium: 3.6 mmol/L (ref 3.5–5.1)
Sodium: 136 mmol/L (ref 135–145)

## 2018-10-12 LAB — CBC
HCT: 32.4 % — ABNORMAL LOW (ref 39.0–52.0)
Hemoglobin: 10.6 g/dL — ABNORMAL LOW (ref 13.0–17.0)
MCH: 29.4 pg (ref 26.0–34.0)
MCHC: 32.7 g/dL (ref 30.0–36.0)
MCV: 89.8 fL (ref 80.0–100.0)
PLATELETS: 213 10*3/uL (ref 150–400)
RBC: 3.61 MIL/uL — ABNORMAL LOW (ref 4.22–5.81)
RDW: 14.3 % (ref 11.5–15.5)
WBC: 15.7 10*3/uL — ABNORMAL HIGH (ref 4.0–10.5)
nRBC: 0 % (ref 0.0–0.2)

## 2018-10-12 LAB — CULTURE, RESPIRATORY: CULTURE: NORMAL

## 2018-10-12 LAB — MAGNESIUM: Magnesium: 1.7 mg/dL (ref 1.7–2.4)

## 2018-10-12 MED ORDER — FUROSEMIDE 40 MG PO TABS
40.0000 mg | ORAL_TABLET | Freq: Every day | ORAL | Status: DC
  Administered 2018-10-12 – 2018-10-14 (×3): 40 mg via ORAL
  Filled 2018-10-12 (×3): qty 1

## 2018-10-12 MED ORDER — INSULIN GLARGINE 100 UNIT/ML ~~LOC~~ SOLN
15.0000 [IU] | Freq: Every day | SUBCUTANEOUS | Status: DC
  Administered 2018-10-12 – 2018-10-13 (×2): 15 [IU] via SUBCUTANEOUS
  Filled 2018-10-12 (×3): qty 0.15

## 2018-10-12 MED ORDER — INSULIN ASPART 100 UNIT/ML ~~LOC~~ SOLN
0.0000 [IU] | Freq: Three times a day (TID) | SUBCUTANEOUS | Status: DC
  Administered 2018-10-12: 5 [IU] via SUBCUTANEOUS
  Administered 2018-10-13 (×2): 3 [IU] via SUBCUTANEOUS
  Administered 2018-10-14: 2 [IU] via SUBCUTANEOUS

## 2018-10-12 MED ORDER — LISINOPRIL 5 MG PO TABS
2.5000 mg | ORAL_TABLET | Freq: Every day | ORAL | Status: DC
  Administered 2018-10-12 – 2018-10-14 (×3): 2.5 mg via ORAL
  Filled 2018-10-12 (×3): qty 1

## 2018-10-12 NOTE — Plan of Care (Signed)
?  Problem: Activity: ?Goal: Capacity to carry out activities will improve ?Outcome: Progressing ?  ?

## 2018-10-12 NOTE — Progress Notes (Signed)
Progress Note  Patient Name: Ryan BaasLester K Weigel Jr. Date of Encounter: 10/12/2018  Primary Cardiologist: Nanetta BattyJonathan Berry, MD   Subjective   Feeling better.  Able to lie completely supine.  Pleuritic pain with cough. WBC decreasing. No major arrhythmia on monitor. Roughly 2.5 L negative since admission.  Inpatient Medications    Scheduled Meds: . aspirin EC  81 mg Oral Daily  . carvedilol  6.25 mg Oral BID WC  . clopidogrel  75 mg Oral Daily  . enoxaparin (LOVENOX) injection  40 mg Subcutaneous Q24H  . ezetimibe  10 mg Oral Daily  . finasteride  5 mg Oral QHS  . furosemide  40 mg Intravenous Q12H  . insulin aspart  0-9 Units Subcutaneous TID WC  . insulin glargine  12 Units Subcutaneous QHS  . isosorbide mononitrate  15 mg Oral Daily  . Melatonin  2 tablet Oral QHS  . mometasone-formoterol  2 puff Inhalation BID  . pantoprazole  40 mg Oral QAC breakfast  . potassium chloride  20 mEq Oral Daily  . predniSONE  20 mg Oral Q breakfast  . rosuvastatin  10 mg Oral QHS  . sodium chloride flush  3 mL Intravenous Q12H  . tamsulosin  0.4 mg Oral QPC supper   Continuous Infusions: . sodium chloride Stopped (10/11/18 1752)  . cefTRIAXone (ROCEPHIN)  IV Stopped (10/11/18 1321)   PRN Meds: sodium chloride, acetaminophen, albuterol, ondansetron (ZOFRAN) IV, sodium chloride flush   Vital Signs    Vitals:   10/11/18 1744 10/11/18 1940 10/11/18 2314 10/12/18 0309  BP: (!) 148/80 136/68  127/66  Pulse: 79 78  70  Resp: 18 18  18   Temp: 98.1 F (36.7 C) 97.9 F (36.6 C)  98 F (36.7 C)  TempSrc: Oral Oral    SpO2: 95% 91% 98% 99%  Weight:    70.9 kg  Height:        Intake/Output Summary (Last 24 hours) at 10/12/2018 0846 Last data filed at 10/12/2018 [id number] Gross per 24 hour  Intake 854.95 ml  Output 2800 ml  Net -1945.05 ml   Filed Weights   10/10/18 0358 10/11/18 0537 10/12/18 0309  Weight: 71.7 kg 70.9 kg 70.9 kg    Telemetry    Sinus rhythm, occasional PVCs,  brief AI VR- Personally Reviewed  ECG    No new tracing- Personally Reviewed  Physical Exam  Looks comfortable lying fully flat in bed GEN: No acute distress.   Neck:  4 cm JVD Cardiac: RRR, no murmurs, rubs, or gallops.  Respiratory: Clear to auscultation bilaterally. GI: Soft, nontender, non-distended  MS: No edema; No deformity. Neuro:  Nonfocal  Psych: Normal affect   Labs    Chemistry Recent Labs  Lab 10/09/18 1010 10/10/18 0604 10/11/18 0926 10/12/18 0554  NA 134* 133* 132* 136  K 3.3* 3.8 3.4* 3.6  CL 95* 94* 93* 93*  CO2 27 25 25 29   GLUCOSE 103* 224* 214* 178*  BUN 18 28* 34* 30*  CREATININE 1.08 1.13 1.05 1.07  CALCIUM 8.9 8.4* 8.9 8.6*  PROT 6.4*  --   --   --   ALBUMIN 3.7  --   --   --   AST 31  --   --   --   ALT 22  --   --   --   ALKPHOS 48  --   --   --   BILITOT 1.4*  --   --   --   GFRNONAA >60  57* >60 >60  GFRAA >60 >60 >60 >60  ANIONGAP 12 14 14 14      Hematology Recent Labs  Lab 10/10/18 0604 10/11/18 0926 10/12/18 0554  WBC 24.5* 21.0* 15.7*  RBC 3.63* 3.69* 3.61*  HGB 10.8* 10.9* 10.6*  HCT 32.3* 32.8* 32.4*  MCV 89.0 88.9 89.8  MCH 29.8 29.5 29.4  MCHC 33.4 33.2 32.7  RDW 14.5 14.6 14.3  PLT 180 191 213    Cardiac EnzymesNo results for input(s): TROPONINI in the last 168 hours. No results for input(s): TROPIPOC in the last 168 hours.   BNP Recent Labs  Lab 10/09/18 1003  BNP >4,500.0*     DDimer No results for input(s): DDIMER in the last 168 hours.   Radiology    No results found.  Cardiac Studies   Echo 10/09/2018  - Left ventricle: Wall thickness was increased in a pattern of mild LVH. Systolic function was severely reduced. The estimated ejection fraction was in the range of 20% to 25%. Diffuse hypokinesis. - Aortic valve: There was mild stenosis. There was mild regurgitation. Valve area (VTI): 1.76 cm^2. Valve area (Vmax): 1.7 cm^2. Valve area (Vmean): 1.62 cm^2. - Left atrium: The atrium  was severely dilated. - Right ventricle: The cavity size was mildly dilated. Systolic function was moderately reduced. - Right atrium: The atrium was mildly dilated. - Tricuspid valve: There was moderate regurgitation. - Pulmonary arteries: Systolic pressure was moderately increased. PA peak pressure: 52 mm Hg (S).  Cardiac cath 09/08/2018 IMPRESSION:Successful RCA SVG PCI drug-eluting stenting using 2 overlapping synergy drug-eluting stents postdilated to 3.3 mm using spider distal protection. Patient has residual ramus intermedius branch SVG midshaft high-grade stenosis which will need to be intervened on in a staged fashion after his renal function is carefully monitored. I am going to continue his Angiomax full dose for 4 hours since he really was not on antiplatelets prior to admission. He will be gently hydrated and his blood work will be reassessed in the morning. He left the lab in stable condition.    Patient Profile     84 y.o. male with multiple medical problems and acute on chronic systolic congestive heart failure with worsening LVEF in the setting of pneumococcal pneumonia, CAD S/P CABG, recent non-ST elevation MI and DES to SVG-RCA-PDA in November 2019 nonsustained VT moderate pulmonary artery hypertension, hypercholesterolemia, hypertension, type 2 diabetes mellitus, asthma, PAD, BPH, CKD stage II, dementia, history of vasovagal syncope April 2018  Assessment & Plan      1.Acute respiratory failure and encephalopathy:  Resolved.  Baseline dementia/short-term memory problems. 2. Acute on chronic combined CHF: Clearly improved, will discontinue intravenous diuretics.  Blood pressure improved.  Resume his ACE inhibitor. 3. NSVT: Resolved. 4. CKD stage II:  Stable renal function with diuresis 5. CAD s/p history of CABG/PCI: Asymptomatic 6. Pneumonia: White blood cell count is quickly diminishing.  He was never febrile.  Pleuritic pain is present.     For questions  or updates, please contact [facility] Please consult [email or web address] for contact info under        Signed, Thurmon Fair, MD  10/12/2018, 8:46 AM

## 2018-10-12 NOTE — Progress Notes (Signed)
PROGRESS NOTE    [name].  UVO:[id number] DOB: 07/04/1926 DOA: 10/09/2018 PCP: Wanda Plump, MD  Brief Narrative:  Patient is a 83 year old male with history of diabetes, CAD status post CABG and stent, recent NSTEMI in 08/2018, chronic systolic CHF with reduced EF 20-25%, hypertension, hyperlipidemia presented to ED with shortness of breath, altered mental status.  Patient was discharged from the hospital and was recovering well, had some urinary symptoms and had received ciprofloxacin for possible prostatitis by Dr. Darrin Nipper on December 11.  In the past 1 week, patient was having cough and congestion, went to urgent care and was diagnosed with acute laryngitis.  Night before the admission, patient became very confused, not recognizing his family members.  He had a fall that night but did not seem to injure himself.  He was hypoxic, with O2 sats in 80s when EMS arrived.  Chest x-ray showed left lung pneumonia, patient was admitted for further work-up. Assessment & Plan:   Principal Problem:   Acute respiratory failure with hypoxia (HCC) Active Problems:   Dyslipidemia, goal LDL below 70   BPH (benign prostatic hyperplasia)   Insulin dependent diabetes mellitus with complications (HCC)   Sleep apnea- C-pap   Carotid artery disease (HCC)   Chronic combined systolic and diastolic CHF (congestive heart failure) (HCC)   Essential hypertension   Dementia (HCC)   Acute respiratory failure with hypoxia/sepsis from left lower lobe pneumonia/healthcare associated pneumonia/streptococcal pneumonia Improved,but not back to baseline.  patient was started on IV vancomycin and cefepime later on  Transitioned to rocephin to complete the course.  Patient continues to require nasal cannula oxygen to keep sats greater than 90%. Blood cultures have been negative so far but urine strep antigen was positive. Recommend work ing with PT.    Mild acute on chronic combined systolic and diastolic heart  failure Echocardiogram showed left ventricular ejection fraction at 25% with diffuse hypokinesis with grade 2 diastolic dysfunction. Cardiology consulted and patient is currently on Lasix 40 mg twice daily.  Continue with strict intake and output, daily weights and monitor renal parameters and electrolytes while on IV Lasix. Cardiology changed to IV lasix to oral lasix 40 mg daily.    Essential hypertension Blood pressure parameters better today He was restarted on Coreg 6.25 mg twice daily along with Imdur 15 mg daily.    History of coronary artery disease status post CABG Patient denies any chest pain at this time  Continue with aspirin 81 mg, Plavix 75 mg daily   Insulin-dependent diabetes mellitus CBG (last 3)  Recent Labs    10/11/18 2103 10/12/18 0828 10/12/18 1303  GLUCAP 291* 258* 146*    Lantus of 12 units and start sliding scale insulin added. Increase lantus to 15 units and change to mod SSI.     DVT prophylaxis: Lovenox Code Status: DNR Family Communication: Family at bedside today   disposition Plan: Pending clinical improvement  Consultants:  Cardiology  Procedures: Echocardiogram  Antimicrobials: On IV Rocephin to complete the course  Subjective: NO NEW COMPLAINTS, no chest pain or sob.  Objective: Vitals:   10/11/18 1940 10/11/18 2314 10/12/18 0309 10/12/18 1346  BP: 136/68  127/66 (!) 103/53  Pulse: 78  70 65  Resp: 18  18 (!) 21  Temp: 97.9 F (36.6 C)  98 F (36.7 C) (!) 97.4 F (36.3 C)  TempSrc: Oral   Oral  SpO2: 91% 98% 99% 95%  Weight:   70.9 kg   Height:  Intake/Output Summary (Last 24 hours) at 10/12/2018 1532 Last data filed at 10/12/2018 1407 Gross per 24 hour  Intake 1131.61 ml  Output 2650 ml  Net -1518.39 ml   Filed Weights   10/10/18 0358 10/11/18 0537 10/12/18 0309  Weight: 71.7 kg 70.9 kg 70.9 kg    Examination:  General exam: Appears calm and comfortable , not in distress.  Respiratory system:  Diminished at bases, tachypnea on talking. No wheezing or rhonchi.  Cardiovascular system: S1 & S2 heard, RRR.  JVD present  gastrointestinal system: Abdomen is nondistended, soft and nontender.  Normal bowel sounds heard. Central nervous system: Alert and oriented.  Grossly nonfocal  extremities: Trace pedal edema present Skin: No rashes, lesions or ulcers Psychiatry: Mood & affect appropriate.     Data Reviewed: I have personally reviewed following labs and imaging studies  CBC: Recent Labs  Lab 10/09/18 1010 10/10/18 0604 10/11/18 0926 10/12/18 0554  WBC 20.1* 24.5* 21.0* 15.7*  NEUTROABS 18.1* 23.0*  --   --   HGB 11.8* 10.8* 10.9* 10.6*  HCT 35.3* 32.3* 32.8* 32.4*  MCV 89.1 89.0 88.9 89.8  PLT 186 180 191 213   Basic Metabolic Panel: Recent Labs  Lab 10/09/18 1010 10/10/18 0604 10/10/18 1118 10/11/18 0926 10/12/18 0554  NA 134* 133*  --  132* 136  K 3.3* 3.8  --  3.4* 3.6  CL 95* 94*  --  93* 93*  CO2 27 25  --  25 29  GLUCOSE 103* 224*  --  214* 178*  BUN 18 28*  --  34* 30*  CREATININE 1.08 1.13  --  1.05 1.07  CALCIUM 8.9 8.4*  --  8.9 8.6*  MG  --  1.1* 2.2 1.9 1.7   GFR: Estimated Creatinine Clearance: 41.4 mL/min (by C-G formula based on SCr of 1.07 mg/dL). Liver Function Tests: Recent Labs  Lab 10/09/18 1010  AST 31  ALT 22  ALKPHOS 48  BILITOT 1.4*  PROT 6.4*  ALBUMIN 3.7   No results for input(s): LIPASE, AMYLASE in the last 168 hours. No results for input(s): AMMONIA in the last 168 hours. Coagulation Profile: No results for input(s): INR, PROTIME in the last 168 hours. Cardiac Enzymes: No results for input(s): CKTOTAL, CKMB, CKMBINDEX, TROPONINI in the last 168 hours. BNP (last 3 results) No results for input(s): PROBNP in the last 8760 hours. HbA1C: No results for input(s): HGBA1C in the last 72 hours. CBG: Recent Labs  Lab 10/09/18 1016 10/11/18 1741 10/11/18 2103 10/12/18 0828 10/12/18 1303  GLUCAP 94 286* 291* 258* 146*     Lipid Profile: No results for input(s): CHOL, HDL, LDLCALC, TRIG, CHOLHDL, LDLDIRECT in the last 72 hours. Thyroid Function Tests: No results for input(s): TSH, T4TOTAL, FREET4, T3FREE, THYROIDAB in the last 72 hours. Anemia Panel: No results for input(s): VITAMINB12, FOLATE, FERRITIN, TIBC, IRON, RETICCTPCT in the last 72 hours. Sepsis Labs: Recent Labs  Lab 10/09/18 1324 10/09/18 1616 10/09/18 2020  PROCALCITON  --   --  15.91  LATICACIDVEN 1.95* 2.9* 2.7*    Recent Results (from the past 240 hour(s))  Culture, blood (routine x 2) Call MD if unable to obtain prior to antibiotics being given     Status: None (Preliminary result)   Collection Time: 10/09/18  4:16 PM  Result Value Ref Range Status   Specimen Description BLOOD LEFT WRIST  Final   Special Requests   Final    BOTTLES DRAWN AEROBIC ONLY Blood Culture adequate volume  Culture   Final    NO GROWTH 3 DAYS Performed at [facility], [address], [place], [place] [address]    Report Status PENDING  Incomplete  Culture, blood (routine x 2) Call MD if unable to obtain prior to antibiotics being given     Status: None (Preliminary result)   Collection Time: 10/09/18  4:33 PM  Result Value Ref Range Status   Specimen Description BLOOD LEFT HAND  Final   Special Requests   Final    BOTTLES DRAWN AEROBIC ONLY Blood Culture adequate volume   Culture   Final    NO GROWTH 3 DAYS Performed at [facility], [address] [place], [place] [address]    Report Status PENDING  Incomplete  Culture, sputum-assessment     Status: None   Collection Time: 10/09/18  7:20 PM  Result Value Ref Range Status   Specimen Description EXPECTORATED SPUTUM  Final   Special Requests NONE  Final   Sputum evaluation   Final    THIS SPECIMEN IS ACCEPTABLE FOR SPUTUM CULTURE Performed at [facility], [address], [place], [place] [address]    Report Status 10/09/2018 FINAL  Final  Culture, respiratory     Status:  None   Collection Time: 10/09/18  7:20 PM  Result Value Ref Range Status   Specimen Description EXPECTORATED SPUTUM  Final   Special Requests NONE Reflexed from [id number]  Final   Gram Stain   Final    ABUNDANT WBC PRESENT, PREDOMINANTLY PMN MODERATE GRAM POSITIVE COCCI RARE GRAM POSITIVE RODS    Culture   Final    FEW Consistent with normal respiratory flora. Performed at [facility], [address], [place], [place] [address]    Report Status 10/12/2018 FINAL  Final         Radiology Studies: No results found.      Scheduled Meds: . aspirin EC  81 mg Oral Daily  . carvedilol  6.25 mg Oral BID WC  . clopidogrel  75 mg Oral Daily  . enoxaparin (LOVENOX) injection  40 mg Subcutaneous Q24H  . ezetimibe  10 mg Oral Daily  . finasteride  5 mg Oral QHS  . furosemide  40 mg Oral Daily  . insulin aspart  0-9 Units Subcutaneous TID WC  . insulin glargine  12 Units Subcutaneous QHS  . isosorbide mononitrate  15 mg Oral Daily  . lisinopril  2.5 mg Oral Daily  . Melatonin  2 tablet Oral QHS  . mometasone-formoterol  2 puff Inhalation BID  . pantoprazole  40 mg Oral QAC breakfast  . potassium chloride  20 mEq Oral Daily  . predniSONE  20 mg Oral Q breakfast  . rosuvastatin  10 mg Oral QHS  . sodium chloride flush  3 mL Intravenous Q12H  . tamsulosin  0.4 mg Oral QPC supper   Continuous Infusions: . sodium chloride Stopped (10/11/18 1752)  . cefTRIAXone (ROCEPHIN)  IV 2 g (10/12/18 1335)     LOS: 3 days    Time spent: 28 minutes    Kathlen Mody, MD [facility] Pager [phone]  If 7PM-7AM, please contact night-coverage [email or web address] Password [facility] 10/12/2018, 3:32 PM

## 2018-10-12 NOTE — Progress Notes (Signed)
Pt refused cpap for the night.will monitor

## 2018-10-12 NOTE — Plan of Care (Signed)
?  Problem: Education: ?Goal: Ability to demonstrate management of disease process will improve ?Outcome: Progressing ?  ?Problem: Activity: ?Goal: Capacity to carry out activities will improve ?Outcome: Progressing ?  ?Problem: Cardiac: ?Goal: Ability to achieve and maintain adequate cardiopulmonary perfusion will improve ?Outcome: Progressing ?  ?

## 2018-10-12 NOTE — Progress Notes (Signed)
Pt refuses to wear CPAP for the night.  

## 2018-10-12 NOTE — Progress Notes (Signed)
Patient requested breathing treatment for [facility], RN gave it will continue to monitor.

## 2018-10-13 DIAGNOSIS — J189 Pneumonia, unspecified organism: Secondary | ICD-10-CM

## 2018-10-13 DIAGNOSIS — E876 Hypokalemia: Secondary | ICD-10-CM

## 2018-10-13 LAB — BASIC METABOLIC PANEL
Anion gap: 13 (ref 5–15)
BUN: 31 mg/dL — ABNORMAL HIGH (ref 8–23)
CO2: 28 mmol/L (ref 22–32)
Calcium: 9 mg/dL (ref 8.9–10.3)
Chloride: 95 mmol/L — ABNORMAL LOW (ref 98–111)
Creatinine, Ser: 0.75 mg/dL (ref 0.61–1.24)
GFR calc Af Amer: >60 mL/min (ref >60)
GFR calc non Af Amer: >60 mL/min (ref >60)
GLUCOSE: 120 mg/dL — AB (ref 70–99)
Potassium: 3.4 mmol/L — ABNORMAL LOW (ref 3.5–5.1)
Sodium: 136 mmol/L (ref 135–145)

## 2018-10-13 LAB — CBC
HCT: 31.6 % — ABNORMAL LOW (ref 39.0–52.0)
Hemoglobin: 10.8 g/dL — ABNORMAL LOW (ref 13.0–17.0)
MCH: 30.4 pg (ref 26.0–34.0)
MCHC: 34.2 g/dL (ref 30.0–36.0)
MCV: 89 fL (ref 80.0–100.0)
Platelets: 239 10*3/uL (ref 150–400)
RBC: 3.55 MIL/uL — ABNORMAL LOW (ref 4.22–5.81)
RDW: 14.1 % (ref 11.5–15.5)
WBC: 12.7 10*3/uL — ABNORMAL HIGH (ref 4.0–10.5)
nRBC: 0 % (ref 0.0–0.2)

## 2018-10-13 LAB — GLUCOSE, CAPILLARY
Glucose-Capillary: 102 mg/dL — ABNORMAL HIGH (ref 70–99)
Glucose-Capillary: 186 mg/dL — ABNORMAL HIGH (ref 70–99)
Glucose-Capillary: 291 mg/dL — ABNORMAL HIGH (ref 70–99)
Glucose-Capillary: 312 mg/dL — ABNORMAL HIGH (ref 70–99)

## 2018-10-13 LAB — MAGNESIUM: Magnesium: 1.6 mg/dL — ABNORMAL LOW (ref 1.7–2.4)

## 2018-10-13 MED ORDER — HYDROCOD POLST-CPM POLST ER 10-8 MG/5ML PO SUER
5.0000 mL | Freq: Once | ORAL | Status: AC
  Administered 2018-10-14: 5 mL via ORAL
  Filled 2018-10-13: qty 5

## 2018-10-13 MED ORDER — POTASSIUM CHLORIDE CRYS ER 20 MEQ PO TBCR
40.0000 meq | EXTENDED_RELEASE_TABLET | Freq: Every day | ORAL | Status: DC
  Administered 2018-10-13 – 2018-10-14 (×2): 40 meq via ORAL
  Filled 2018-10-13 (×2): qty 2

## 2018-10-13 NOTE — Progress Notes (Signed)
Patient foley has been removed wife at bedside expressed wanting short stay rehab instead of home health.

## 2018-10-13 NOTE — Clinical Social Work Placement (Signed)
   CLINICAL SOCIAL WORK PLACEMENT  NOTE  Date:  10/13/2018  Patient Details  Name: [name]. MRN: [id number] Date of Birth: 1926/05/03  Clinical Social Work is seeking post-discharge placement for this patient at the Skilled  Nursing Facility level of care (*CSW will initial, date and re-position this form in  chart as items are completed):      Patient/family provided with [facility] Clinical Social Work Department's list of facilities offering this level of care within the geographic area requested by the patient (or if unable, by the patient's family).      Patient/family informed of their freedom to choose among providers that offer the needed level of care, that participate in Medicare, Medicaid or managed care program needed by the patient, have an available bed and are willing to accept the patient.      Patient/family informed of [place]'s ownership interest in [facility] and [facility], as well as of the fact that they are under no obligation to receive care at these facilities.  PASRR submitted to EDS on 10/13/18     PASRR number received on       Existing PASRR number confirmed on 10/13/18     FL2 transmitted to all facilities in geographic area requested by pt/family on 10/13/18     FL2 transmitted to all facilities within larger geographic area on       Patient informed that his/her managed care company has contracts with or will negotiate with certain facilities, including the following:            Patient/family informed of bed offers received.  Patient chooses bed at       Physician recommends and patient chooses bed at      Patient to be transferred to   on  .  Patient to be transferred to facility by       Patient family notified on   of transfer.  Name of family member notified:        PHYSICIAN Please sign FL2     Additional Comment:    _______________________________________________ Margarito Liner, LCSW 10/13/2018, 2:13  PM

## 2018-10-13 NOTE — Progress Notes (Signed)
Family in room stated that patient does not wear CPAP at home and does not want to wear a CPAP while at the hospital.

## 2018-10-13 NOTE — Clinical Social Work Note (Signed)
Per MD, patient will likely be stable for discharge tomorrow. [facility] will have a bed available. Patient, wife, and MD aware.  Charlynn Court, CSW [phone]

## 2018-10-13 NOTE — [place] FL2 (Signed)
[place] MEDICAID FL2 LEVEL OF CARE SCREENING TOOL     IDENTIFICATION  Patient Name: Ryan BaasLester K Parlow Jr. Birthdate: 07/31/1926 Sex: male Admission Date (Current Location): 10/09/2018  [facility] [place]  [employer]   Facility and Address:  [facility] [place], [address], [place], [place] [address]      Provider Number: [id number]  Attending Physician Name and Address:  Eddie Northhungel, Nishant, MD  Relative Name and Phone Number:       Current Level of Care: Hospital Recommended Level of Care: Skilled Nursing Facility Prior Approval Number:    Date Approved/Denied:   PASRR Number: [id number]  Discharge Plan: SNF    Current Diagnoses: Patient Active Problem List   Diagnosis Date Noted  . Acute respiratory failure with hypoxia (HCC) 10/09/2018  . Urinary retention 09/10/2018  . NSTEMI (non-ST elevated myocardial infarction) (HCC) 09/06/2018  . Dementia (HCC) 11/26/2016  . Essential hypertension 07/30/2016  . PCP NOTES >>>>>>>>> 11/28/2015  . Chronic combined systolic and diastolic CHF (congestive heart failure) (HCC) 07/08/2015  . Fatigue 06/01/2015  . Annual physical exam 08/04/2014  . Carotid artery disease (HCC) 12/25/2013  . Syncope 11/02/2013  . Left facial numbness 11/02/2013  . Sleep apnea- C-pap   . Ischemic cardiomyopathy 09/16/2012  . S/P angioplasty with stent  09/16/2012  . Insulin dependent diabetes mellitus with complications (HCC) 09/15/2012  . BENIGN POSITIONAL VERTIGO 03/09/2010  . 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 Dyslipidemia, goal LDL below 70 06/18/2007  . Hx of CABG 06/18/2007  . ALLERGIC RHINITIS 06/18/2007  . Asthma, chronic 06/18/2007  . BPH (benign prostatic hyperplasia) 06/18/2007  . PANCREATITIS, HX OF 06/18/2007  . COLONIC POLYPS, HX OF 06/18/2007    Orientation RESPIRATION BLADDER Height & Weight     Self, Place  Normal(Cpap at night. Refuses it.) Continent Weight: 155 lb 9.6 oz (70.6  kg) Height:  5\' 6"  (167.6 cm)  BEHAVIORAL SYMPTOMS/MOOD NEUROLOGICAL BOWEL NUTRITION STATUS  (None) (Dementia) Continent Diet(Heart healthy. Fluid restriction 1500 mL.)  AMBULATORY STATUS COMMUNICATION OF NEEDS Skin   Limited Assist Verbally Bruising                       Personal Care Assistance Level of Assistance              Functional Limitations Info  Sight, Hearing, Speech Sight Info: Adequate Hearing Info: Adequate Speech Info: Adequate    SPECIAL CARE FACTORS FREQUENCY  PT (By licensed PT), Blood pressure     PT Frequency: 5 x week              Contractures Contractures Info: Not present    Additional Factors Info  Code Status, Allergies Code Status Info: DNR Allergies Info: Ativan (Lorazepam), Colesevelam, Ezetimibe-simvastatin.           Current Medications (10/13/2018):  This is the current hospital active medication list Current Facility-Administered Medications  Medication Dose Route Frequency Provider Last Rate Last Dose  . 0.9 %  sodium chloride infusion  250 mL Intravenous PRN Jonah BlueYates, Jennifer, MD   Stopped at 10/12/18 1531  . acetaminophen (TYLENOL) tablet 650 mg  650 mg Oral Q4H PRN Jonah BlueYates, Jennifer, MD      . albuterol (PROVENTIL) (2.5 MG/3ML) 0.083% nebulizer solution 2.5 mg  2.5 mg Nebulization Q2H PRN Jonah BlueYates, Jennifer, MD   2.5 mg at 10/11/18 2354  . aspirin EC tablet 81 mg  81 mg Oral Daily Croitoru, Mihai, MD   81 mg  at 10/13/18 0802  . carvedilol (COREG) tablet 6.25 mg  6.25 mg Oral BID WC Croitoru, Mihai, MD   6.25 mg at 10/13/18 0801  . cefTRIAXone (ROCEPHIN) 2 g in sodium chloride 0.9 % 100 mL IVPB  2 g Intravenous Q24H Rai, Ripudeep K, MD 200 mL/hr at 10/12/18 1335 2 g at 10/12/18 1335  . clopidogrel (PLAVIX) tablet 75 mg  75 mg Oral Daily Jonah Blue, MD   75 mg at 10/12/18 1330  . enoxaparin (LOVENOX) injection 40 mg  40 mg Subcutaneous Q24H Jonah Blue, MD   40 mg at 10/12/18 2154  . ezetimibe (ZETIA) tablet 10 mg  10 mg  Oral Daily Jonah Blue, MD   10 mg at 10/13/18 0802  . finasteride (PROSCAR) tablet 5 mg  5 mg Oral QHS Jonah Blue, MD   5 mg at 10/12/18 2155  . furosemide (LASIX) tablet 40 mg  40 mg Oral Daily Croitoru, Mihai, MD   40 mg at 10/12/18 0912  . insulin aspart (novoLOG) injection 0-15 Units  0-15 Units Subcutaneous TID WC Kathlen Mody, MD   3 Units at 10/13/18 1206  . insulin glargine (LANTUS) injection 15 Units  15 Units Subcutaneous QHS Kathlen Mody, MD   15 Units at 10/12/18 2227  . isosorbide mononitrate (IMDUR) 24 hr tablet 15 mg  15 mg Oral Daily Jonah Blue, MD   15 mg at 10/13/18 0802  . lisinopril (PRINIVIL,ZESTRIL) tablet 2.5 mg  2.5 mg Oral Daily Croitoru, Mihai, MD   2.5 mg at 10/13/18 0802  . Melatonin TABS 6 mg  2 tablet Oral Noemi Chapel, MD   6 mg at 10/12/18 2155  . mometasone-formoterol (DULERA) 100-5 MCG/ACT inhaler 2 puff  2 puff Inhalation BID Jonah Blue, MD   2 puff at 10/13/18 0848  . ondansetron (ZOFRAN) injection 4 mg  4 mg Intravenous Q6H PRN Jonah Blue, MD      . pantoprazole (PROTONIX) EC tablet 40 mg  40 mg Oral QAC breakfast Jonah Blue, MD   40 mg at 10/13/18 0802  . potassium chloride SA (K-DUR,KLOR-CON) CR tablet 40 mEq  40 mEq Oral Daily Dhungel, Nishant, MD   40 mEq at 10/13/18 0802  . rosuvastatin (CRESTOR) tablet 10 mg  10 mg Oral Noemi Chapel, MD   10 mg at 10/12/18 2154  . sodium chloride flush (NS) 0.9 % injection 3 mL  3 mL Intravenous Q12H Jonah Blue, MD   3 mL at 10/13/18 0805  . sodium chloride flush (NS) 0.9 % injection 3 mL  3 mL Intravenous PRN Jonah Blue, MD      . tamsulosin [facility]) capsule 0.4 mg  0.4 mg Oral QPC supper Jonah Blue, MD   0.4 mg at 10/12/18 1807     Discharge Medications: Please see discharge summary for a list of discharge medications.  Relevant Imaging Results:  Relevant Lab Results:   Additional Information SS#: [id number]. Went to [facility] 11/27. Unknown  discharge date.  Margarito Liner, LCSW

## 2018-10-13 NOTE — Progress Notes (Signed)
SATURATION QUALIFICATIONS: (This note is used to comply with regulatory documentation for home oxygen)  Patient Saturations on Room Air at Rest = 87%  Patient Saturations on Room Air while Ambulating = 83%  Patient Saturations on 2 Liters of oxygen while Ambulating = 91%  Please briefly explain why patient needs home oxygen: Pt not able to maintain Sp02 >90% on RA during activity and may require supplemental 02 at discharge.   Mylo Red, PT, DPT Acute Rehabilitation Services Pager [phone] Office [phone]

## 2018-10-13 NOTE — Progress Notes (Signed)
Progress Note  Patient Name: [name]. Date of Encounter: 10/13/2018  Primary Cardiologist:   [name], MD   Subjective   He is somnolent but wakes up and denies chest pain or SOB.   Inpatient Medications    Scheduled Meds: . aspirin EC  81 mg Oral Daily  . carvedilol  6.25 mg Oral BID WC  . clopidogrel  75 mg Oral Daily  . enoxaparin (LOVENOX) injection  40 mg Subcutaneous Q24H  . ezetimibe  10 mg Oral Daily  . finasteride  5 mg Oral QHS  . furosemide  40 mg Oral Daily  . insulin aspart  0-15 Units Subcutaneous TID WC  . insulin glargine  15 Units Subcutaneous QHS  . isosorbide mononitrate  15 mg Oral Daily  . lisinopril  2.5 mg Oral Daily  . Melatonin  2 tablet Oral QHS  . mometasone-formoterol  2 puff Inhalation BID  . pantoprazole  40 mg Oral QAC breakfast  . potassium chloride  40 mEq Oral Daily  . rosuvastatin  10 mg Oral QHS  . sodium chloride flush  3 mL Intravenous Q12H  . tamsulosin  0.4 mg Oral QPC supper   Continuous Infusions: . sodium chloride Stopped (10/12/18 1531)  . cefTRIAXone (ROCEPHIN)  IV 2 g (10/12/18 1335)   PRN Meds: sodium chloride, acetaminophen, albuterol, ondansetron (ZOFRAN) IV, sodium chloride flush   Vital Signs    Vitals:   10/12/18 1959 10/13/18 0056 10/13/18 0512 10/13/18 0849  BP: 130/61  129/64   Pulse: 72  75   Resp: 18  20   Temp: 98.1 F (36.7 C)  (!) 97.3 F (36.3 C)   TempSrc: Oral  Oral   SpO2: 99%  95% 91%  Weight:  70.6 kg    Height:        Intake/Output Summary (Last 24 hours) at 10/13/2018 1013 Last data filed at 10/13/2018 0514 Gross per 24 hour  Intake 727.42 ml  Output 950 ml  Net -222.58 ml   Filed Weights   10/11/18 0537 10/12/18 0309 10/13/18 0056  Weight: 70.9 kg 70.9 kg 70.6 kg    Telemetry    NSR - Personally Reviewed  ECG    NA - Personally Reviewed  Physical Exam   GEN: No acute distress.   Neck: No  JVD Cardiac: RRR, no murmurs, rubs, or gallops.    Respiratory: Decreased breath sounds, no crackles GI: Soft, nontender, non-distended  MS: No  edema; No deformity. Neuro:  Nonfocal  Psych: Normal affect.  Somnolent.    Labs    Chemistry Recent Labs  Lab 10/09/18 1010  10/11/18 0926 10/12/18 0554 10/13/18 0451  NA 134*   < > 132* 136 136  K 3.3*   < > 3.4* 3.6 3.4*  CL 95*   < > 93* 93* 95*  CO2 27   < > 25 29 28   GLUCOSE 103*   < > 214* 178* 120*  BUN 18   < > 34* 30* 31*  CREATININE 1.08   < > 1.05 1.07 0.75  CALCIUM 8.9   < > 8.9 8.6* 9.0  PROT 6.4*  --   --   --   --   ALBUMIN 3.7  --   --   --   --   AST 31  --   --   --   --   ALT 22  --   --   --   --   ALKPHOS 48  --   --   --   --  BILITOT 1.4*  --   --   --   --   GFRNONAA >60   < > >60 >60 >60  GFRAA >60   < > >60 >60 >60  ANIONGAP 12   < > 14 14 13    < > = values in this interval not displayed.     Hematology Recent Labs  Lab 10/11/18 0926 10/12/18 0554 10/13/18 0451  WBC 21.0* 15.7* 12.7*  RBC 3.69* 3.61* 3.55*  HGB 10.9* 10.6* 10.8*  HCT 32.8* 32.4* 31.6*  MCV 88.9 89.8 89.0  MCH 29.5 29.4 30.4  MCHC 33.2 32.7 34.2  RDW 14.6 14.3 14.1  PLT 191 213 239    Cardiac EnzymesNo results for input(s): TROPONINI in the last 168 hours. No results for input(s): TROPIPOC in the last 168 hours.   BNP Recent Labs  Lab 10/09/18 1003  BNP >4,500.0*     DDimer No results for input(s): DDIMER in the last 168 hours.   Radiology    No results found.  Cardiac Studies   Echo:    Study Conclusions  - Left ventricle: Wall thickness was increased in a pattern of mild   LVH. Systolic function was severely reduced. The estimated   ejection fraction was in the range of 20% to 25%. Diffuse   hypokinesis. - Aortic valve: There was mild stenosis. There was mild   regurgitation. Valve area (VTI): 1.76 cm^2. Valve area (Vmax):   1.7 cm^2. Valve area (Vmean): 1.62 cm^2. - Left atrium: The atrium was severely dilated. - Right ventricle: The cavity size  was mildly dilated. Systolic   function was moderately reduced. - Right atrium: The atrium was mildly dilated. - Tricuspid valve: There was moderate regurgitation. - Pulmonary arteries: Systolic pressure was moderately increased.   PA peak pressure: 52 mm Hg (S).   Patient Profile     84 y.o. male  with multiple medical problems and acute on chronic systolic congestive heart failurewith worsening LVEFin the setting of pneumococcal pneumonia, CAD S/P CABG,recent non-ST elevation MI and DEStoSVG-RCA-PDAinNovember 2019 nonsustained VT moderate pulmonary artery hypertension, hypercholesterolemia, hypertension, type 2 diabetes mellitus, asthma, PAD, BPH, CKD stage II, dementia, history of vasovagal syncope April 2018   Assessment & Plan    ACUTE RESPIRATORY FAILURE:   Treated for pneumonia.  Plan per primary team.  Afebrile and WBC has come down.    ACUTE ON CHRONIC SYSTOLIC AND DIASTOLIC HF:  ACE inhibitor started yesterday.  Net negative 2.1 liters this admission. He is back on his prior cardiac meds.   No further work up or change in therapy.  EF is very low but about the same as previous.    NSVT:  NSR.  No further arrhythmia.    CKD II:   Creat is normal and down from previous.   Follow as an out patient.   CAD:  Medical management without need for any acute testing this admission.   [facility] will sign off.   Medication Recommendations:  Meds as on Mar Other recommendations (labs, testing, etc):  NA Follow up as an outpatient:  We will schedule follow up in within the month in our office.    For questions or updates, please contact [facility] Please consult [email or web address] for contact info under Cardiology/STEMI.   Signed, Rollene RotundaJames Kanaya Gunnarson, MD  10/13/2018, 10:13 AM

## 2018-10-13 NOTE — Clinical Social Work Note (Signed)
Clinical Social Work Assessment  Patient Details  Name: [name]. MRN: [id number] Date of Birth: December 17, 1925  Date of referral:  10/13/18               Reason for consult:  Facility Placement, Discharge Planning                Permission sought to share information with:  Facility Medical sales representative, Family Supports Permission granted to share information::     Name::     Chartered loss adjuster  Agency::  SNF's  Relationship::  Wife  Contact Information:  [phone]  Housing/Transportation Living arrangements for the past 2 months:  Single Family Home Source of Information:  Medical Team, Spouse Patient Interpreter Needed:  None Criminal Activity/Legal Involvement Pertinent to Current Situation/Hospitalization:  No - Comment as needed Significant Relationships:  Spouse Lives with:  Spouse Do you feel safe going back to the place where you live?  Yes Need for family participation in patient care:  Yes (Comment)  Care giving concerns:  PT recommending SNF once medically stable for discharge.   Social Worker assessment / plan:  Patient not fully oriented. Wife and two other supports at bedside. CSW introduced role and explained that PT recommendations would be discussed. Patient's wife agreeable to SNF placement. First preference is [facility], second preference is [facility], occupational. Discovered through chart review that patient discharged to [facility] on 11/27. Unknown discharge date home. Left voicemail for admissions coordinator asking her to review referral. No further concerns. CSW encouraged patient's wife to contact CSW as needed. CSW will continue to follow patient and his wife for support and facilitate discharge to SNF once medically stable.  Employment status:  Retired Health and safety inspector:  Medicare PT Recommendations:  Skilled Nursing Facility Information / Referral to community resources:  Skilled Nursing Facility  Patient/Family's Response to care:  Patient not  fully oriented. Patient's wife agreeable to SNF placement. Patient's wife supportive and involved in patient's care. Patient's wife appreciated social work intervention.  Patient/Family's Understanding of and Emotional Response to Diagnosis, Current Treatment, and Prognosis:  Patient not fully oriented. Patient's wife has a good understanding of the reason for admission and his need for rehab prior to returning home. Patient's wife appears happy with hospital care.  Emotional Assessment Appearance:  Appears stated age Attitude/Demeanor/Rapport:  Unable to Assess Affect (typically observed):  Unable to Assess Orientation:  Oriented to Self, Oriented to Place Alcohol / Substance use:  Never Used Psych involvement (Current and /or in the community):  No (Comment)  Discharge Needs  Concerns to be addressed:  Care Coordination Readmission within the last 30 days:  No Current discharge risk:  Dependent with Mobility, Cognitively Impaired Barriers to Discharge:  Continued Medical Work up   Margarito Liner, LCSW 10/13/2018, 2:09 PM

## 2018-10-13 NOTE — Progress Notes (Signed)
Pt POA wants to request a 10 day antibiotic to prevent UTI. Will talk to MD in the morning.

## 2018-10-13 NOTE — Progress Notes (Signed)
Physical Therapy Treatment Patient Details Name: Ryan BaasLester K Gudgel Newman. MRN: [id number] DOB: 03/15/1926 Today's Date: 10/13/2018    History of Present Illness [name]. is a 84 y.o. male with medical history significant of DM; CAD s/p CABG and stent; NICM with reduced EF; HTN; HLD; and BPH presenting with AMS.  He was really disoriented last night.  He was agitated.  He had difficulty lying down to sleep.  In the middle of the night, he asked his wife to get him up and dressed.  He fell a few hours later and his wife couldn't get him up. Appears to have PNA causing confusion.     PT Comments    Patient progressing slowly towards PT goals. Requires Mod A to stand from low surfaces and Min A for balance during gait training. Max cues for RW proximity/management. Sp02 dropped to 83% on RA during gait with 2/4 DOE. Pt fatigues and requires standing rest break during ambulation. Able to maintain Sp02 >91% on 2L/min 02. Discharge recommendation updated to SNF as wife not able to care for pt, his new need for 02, cognitive status and weakness. High fall risk. Will follow.     Follow Up Recommendations  SNF;Supervision/Assistance - 24 hour;Supervision for mobility/OOB     Equipment Recommendations  None recommended by PT    Recommendations for Other Services       Precautions / Restrictions Precautions Precautions: Fall Precaution Comments: watch 02 Restrictions Weight Bearing Restrictions: No    Mobility  Bed Mobility Overal bed mobility: Needs Assistance Bed Mobility: Supine to Sit     Supine to sit: Min guard;HOB elevated     General bed mobility comments: Increased effort to get to EOB but no physical assist needed.  Transfers Overall transfer level: Needs assistance Equipment used: Rolling walker (2 wheeled) Transfers: Sit to/from Stand Sit to Stand: Mod assist         General transfer comment: Assist to power to standing with cues for anterior weight shift and use  of momentum. Transferred to chair post ambulation.  Ambulation/Gait Ambulation/Gait assistance: Min assist Gait Distance (Feet): 110 Feet Assistive device: Rolling walker (2 wheeled) Gait Pattern/deviations: Step-through pattern;Decreased stride length;Trunk flexed;Drifts right/left Gait velocity: decreased   General Gait Details: Slow, mildly unsteady gait wtih cues for RW proximity esp during turns; 2/4 DOE. Sp02 dropped to 83% on RA. 1 standing rest break.    Stairs             Wheelchair Mobility    Modified Rankin (Stroke Patients Only)       Balance Overall balance assessment: Needs assistance Sitting-balance support: No upper extremity supported;Feet supported Sitting balance-Leahy Scale: Fair Sitting balance - Comments: posterior lean noted at times.  Postural control: Posterior lean Standing balance support: Bilateral upper extremity supported;During functional activity Standing balance-Leahy Scale: Poor Standing balance comment: Use of BUEs for support on RW.                             Cognition Arousal/Alertness: Awake/alert Behavior During Therapy: WFL for tasks assessed/performed Overall Cognitive Status: History of cognitive impairments - at baseline                                 General Comments: some memory issues      Exercises      General Comments General comments (skin integrity,  edema, etc.): Wife present during session.      Pertinent Vitals/Pain Pain Assessment: No/denies pain    Home Living                      Prior Function            PT Goals (current goals can now be found in the care plan section) Progress towards PT goals: Progressing toward goals    Frequency    Min 2X/week      PT Plan Discharge plan needs to be updated;Frequency needs to be updated    Co-evaluation              AM-PAC PT "6 Clicks" Mobility   Outcome Measure  Help needed turning from your back  to your side while in a flat bed without using bedrails?: A Little Help needed moving from lying on your back to sitting on the side of a flat bed without using bedrails?: A Little Help needed moving to and from a bed to a chair (including a wheelchair)?: A Little Help needed standing up from a chair using your arms (e.g., wheelchair or bedside chair)?: A Lot Help needed to walk in hospital room?: A Little Help needed climbing 3-5 steps with a railing? : A Lot 6 Click Score: 16    End of Session Equipment Utilized During Treatment: Gait belt Activity Tolerance: Patient limited by fatigue;Treatment limited secondary to medical complications (Comment)(drop in Sp02) Patient left: in chair;with call bell/phone within reach;with family/visitor present(wife declined chair alarm as she will be with him all day) Nurse Communication: Mobility status PT Visit Diagnosis: Unsteadiness on feet (R26.81)     Time: 6286-3817 PT Time Calculation (min) (ACUTE ONLY): 22 min  Charges:  $Gait Training: 8-22 mins                     Mylo Red, PT, DPT Acute Rehabilitation Services Pager [phone] Office [phone]       Blake Divine A Lanier Ensign 10/13/2018, 11:36 AM

## 2018-10-13 NOTE — Plan of Care (Signed)
  Problem: Fluid Volume: Goal: Ability to maintain a balanced intake and output will improve Outcome: Progressing Note:  Urinating in [facility] Voiding trials successful.

## 2018-10-13 NOTE — Progress Notes (Signed)
Pt has been having a continuous productive cough. Paged MD. Awaiting orders

## 2018-10-13 NOTE — Care Management Important Message (Signed)
Important Message  Patient Details  Name: [name]. MRN: [id number] Date of Birth: [birth year]   Medicare Important Message Given:  Yes    Arva Slaugh P Lazlo Tunney 10/13/2018, 3:22 PM

## 2018-10-13 NOTE — Progress Notes (Signed)
PROGRESS NOTE                                                                                                                                                                                                             Patient Demographics:    [name], is a 84 y.o. male, DOB - 27-Jan-1926, UYE:[id number]  Admit date - 10/09/2018   Admitting Physician Jonah Blue, MD  Outpatient Primary MD for the patient is Wanda Plump, MD  LOS - 4  Outpatient Specialists: Cardiology  Chief Complaint  Patient presents with  . Altered Mental Status  . Shortness of Breath       Brief Narrative 84 year old male with history of CAD status post CABG and stent, recent NSTEMI in November 2019, chronic systolic CHF with EF of 20-25%, hypertension, hyperlipidemia and diabetes mellitus.  Patient was doing well after hospital discharge, had some urinary symptoms and treated for possible prostatitis with ciprofloxacin about 2 weeks back by his PCP.  1 week prior to admission he started having cough with congestion and was diagnosed to have acute laryngitis at the urgent care.  On the night prior to admission he became confused, fell at home, was found to be hypoxic with O2 sat in the 80s.  Chest x-ray showed left lower lobe pneumonia.  Hospital course prolonged with acute respiratory failure with hypoxia and acute on chronic systolic CHF.   Subjective:   Patient reportedly was restless and confused throughout the night.  This morning he seems better oriented.  (At baseline per his wife).  Reports his breathing to be better compared to previous days.   Assessment  & Plan :    Principal Problem:   Acute respiratory failure with hypoxia (HCC) Combination of acute on chronic systolic CHF and left lobar/healthcare associated pneumonia. Maintaining O2 sat on room air but needs 2 L on getting out of bed.  Continue PRN nebs.  Active Problems: Left  lobar/healthcare associated strep pneumonia Received IV vancomycin and cefepime on admission, transition to IV Rocephin (day5/7).  Blood cultures negative, urine strep antigen positive. PRN antitussives.  Acute on chronic combined systolic and diastolic CHF 2D echo with EF of 25% with diffuse hypokinesis and grade 2 diastolic dysfunction.  Cardiology consult appreciated, given IV Lasix, now transition to p.o.  Lasix daily.  Diuresing well.  Strict  I/O and daily weight.  Monitor electrolytes. Continue Coreg, aspirin, Imdur and lisinopril.  CAD/carotid artery disease Continue aspirin, Plavix and statin.  Patient hospitalized 1 month back with NSTEMI and underwent RCA SVG PCI drug-eluting stent.  BPH Continue finasteride and Flomax.  Foley removed today and will monitor for voiding difficulty.     Insulin dependent diabetes mellitus with complications (HCC) CBG stable.  Continue bedtime Lantus and sliding scale coverage.    Essential hypertension Stable.  Continue beta-blocker and ACE inhibitor.   Dementia with sundowning (HCC) Continue bedtime melatonin.  Monitor closely.  Hypokalemia Continue daily potassium supplement.  Generalized weakness Seen by PT who recommended home with home health initially however patient has not been out of bed much and wife feels she is not able to take care of him at home and interested in short-term rehab.  PT to reevaluate today.   Code Status : DNR  Family Communication  : Wife at bedside  Disposition Plan  : Possibly needs SNF, PT to reevaluate  Barriers For Discharge : PT evaluation and further recommendations per cardiology  Consults  : Cardiology  Procedures  : 2d echo  DVT Prophylaxis  :  Lovenox -   Lab Results  Component Value Date   PLT 239 10/13/2018    Antibiotics  :  Anti-infectives (From admission, onward)   Start     Dose/Rate Route Frequency Ordered Stop   10/10/18 1500  vancomycin (VANCOCIN) IVPB 1000 mg/200 mL premix   Status:  Discontinued     1,000 mg 200 mL/hr over 60 Minutes Intravenous Every 24 hours 10/09/18 1434 10/10/18 0923   10/10/18 1100  cefTRIAXone (ROCEPHIN) 2 g in sodium chloride 0.9 % 100 mL IVPB     2 g 200 mL/hr over 30 Minutes Intravenous Every 24 hours 10/10/18 0923     10/09/18 1800  ceFEPIme (MAXIPIME) 1 g in sodium chloride 0.9 % 100 mL IVPB  Status:  Discontinued     1 g 200 mL/hr over 30 Minutes Intravenous Every 12 hours 10/09/18 1608 10/10/18 0923   10/09/18 1430  vancomycin (VANCOCIN) 1,500 mg in sodium chloride 0.9 % 500 mL IVPB     1,500 mg 250 mL/hr over 120 Minutes Intravenous  Once 10/09/18 1429 10/09/18 1915   10/09/18 1400  ceFEPIme (MAXIPIME) 1 g in sodium chloride 0.9 % 100 mL IVPB  Status:  Discontinued     1 g 200 mL/hr over 30 Minutes Intravenous Every 8 hours 10/09/18 1349 10/09/18 1608   10/09/18 1200  cefTRIAXone (ROCEPHIN) 1 g in sodium chloride 0.9 % 100 mL IVPB     1 g 200 mL/hr over 30 Minutes Intravenous  Once 10/09/18 1153 10/09/18 1239   10/09/18 1200  azithromycin (ZITHROMAX) 500 mg in sodium chloride 0.9 % 250 mL IVPB     500 mg 250 mL/hr over 60 Minutes Intravenous  Once 10/09/18 1153 10/09/18 1341        Objective:   Vitals:   10/12/18 1959 10/13/18 0056 10/13/18 0512 10/13/18 0849  BP: 130/61  129/64   Pulse: 72  75   Resp: 18  20   Temp: 98.1 F (36.7 C)  (!) 97.3 F (36.3 C)   TempSrc: Oral  Oral   SpO2: 99%  95% 91%  Weight:  70.6 kg    Height:        Wt Readings from Last 3 Encounters:  10/13/18 70.6 kg  09/24/18 76.7 kg  09/22/18 73.9 kg  Intake/Output Summary (Last 24 hours) at 10/13/2018 1105 Last data filed at 10/13/2018 1041 Gross per 24 hour  Intake 727.42 ml  Output 1200 ml  Net -472.58 ml     Physical Exam  Gen: not in distress, fatigue HEENT: no pallor, moist mucosa, supple neck Chest: Few scattered rhonchi, CVS: N S1&S2, no murmurs, rubs or gallop GI: soft, NT, ND, BS+ Musculoskeletal: warm, no  edema, Foley draining clear urine CNS: AAOX2, nonfocal    Data Review:    CBC Recent Labs  Lab 10/09/18 1010 10/10/18 0604 10/11/18 0926 10/12/18 0554 10/13/18 0451  WBC 20.1* 24.5* 21.0* 15.7* 12.7*  HGB 11.8* 10.8* 10.9* 10.6* 10.8*  HCT 35.3* 32.3* 32.8* 32.4* 31.6*  PLT 186 180 191 213 239  MCV 89.1 89.0 88.9 89.8 89.0  MCH 29.8 29.8 29.5 29.4 30.4  MCHC 33.4 33.4 33.2 32.7 34.2  RDW 14.2 14.5 14.6 14.3 14.1  LYMPHSABS 0.8 0.7  --   --   --   MONOABS 1.0 0.7  --   --   --   EOSABS 0.0 0.0  --   --   --   BASOSABS 0.0 0.0  --   --   --     Chemistries  Recent Labs  Lab 10/09/18 1010 10/10/18 0604 10/10/18 1118 10/11/18 0926 10/12/18 0554 10/13/18 0451  NA 134* 133*  --  132* 136 136  K 3.3* 3.8  --  3.4* 3.6 3.4*  CL 95* 94*  --  93* 93* 95*  CO2 27 25  --  25 29 28   GLUCOSE 103* 224*  --  214* 178* 120*  BUN 18 28*  --  34* 30* 31*  CREATININE 1.08 1.13  --  1.05 1.07 0.75  CALCIUM 8.9 8.4*  --  8.9 8.6* 9.0  MG  --  1.1* 2.2 1.9 1.7 1.6*  AST 31  --   --   --   --   --   ALT 22  --   --   --   --   --   ALKPHOS 48  --   --   --   --   --   BILITOT 1.4*  --   --   --   --   --    ------------------------------------------------------------------------------------------------------------------ No results for input(s): CHOL, HDL, LDLCALC, TRIG, CHOLHDL, LDLDIRECT in the last 72 hours.  Lab Results  Component Value Date   HGBA1C 7.3 (H) 07/08/2018   ------------------------------------------------------------------------------------------------------------------ No results for input(s): TSH, T4TOTAL, T3FREE, THYROIDAB in the last 72 hours.  Invalid input(s): FREET3 ------------------------------------------------------------------------------------------------------------------ No results for input(s): VITAMINB12, FOLATE, FERRITIN, TIBC, IRON, RETICCTPCT in the last 72 hours.  Coagulation profile No results for input(s): INR, PROTIME in the last 168  hours.  No results for input(s): DDIMER in the last 72 hours.  Cardiac Enzymes No results for input(s): CKMB, TROPONINI, MYOGLOBIN in the last 168 hours.  Invalid input(s): CK ------------------------------------------------------------------------------------------------------------------    Component Value Date/Time   BNP >4,500.0 (H) 10/09/2018 1003   BNP 559.1 (H) 08/31/2015 1049    Inpatient Medications  Scheduled Meds: . aspirin EC  81 mg Oral Daily  . carvedilol  6.25 mg Oral BID WC  . clopidogrel  75 mg Oral Daily  . enoxaparin (LOVENOX) injection  40 mg Subcutaneous Q24H  . ezetimibe  10 mg Oral Daily  . finasteride  5 mg Oral QHS  . furosemide  40 mg Oral Daily  . insulin aspart  0-15 Units Subcutaneous  TID WC  . insulin glargine  15 Units Subcutaneous QHS  . isosorbide mononitrate  15 mg Oral Daily  . lisinopril  2.5 mg Oral Daily  . Melatonin  2 tablet Oral QHS  . mometasone-formoterol  2 puff Inhalation BID  . pantoprazole  40 mg Oral QAC breakfast  . potassium chloride  40 mEq Oral Daily  . rosuvastatin  10 mg Oral QHS  . sodium chloride flush  3 mL Intravenous Q12H  . tamsulosin  0.4 mg Oral QPC supper   Continuous Infusions: . sodium chloride Stopped (10/12/18 1531)  . cefTRIAXone (ROCEPHIN)  IV 2 g (10/12/18 1335)   PRN Meds:.sodium chloride, acetaminophen, albuterol, ondansetron (ZOFRAN) IV, sodium chloride flush  Micro Results Recent Results (from the past 240 hour(s))  Culture, blood (routine x 2) Call MD if unable to obtain prior to antibiotics being given     Status: None (Preliminary result)   Collection Time: 10/09/18  4:16 PM  Result Value Ref Range Status   Specimen Description BLOOD LEFT WRIST  Final   Special Requests   Final    BOTTLES DRAWN AEROBIC ONLY Blood Culture adequate volume   Culture   Final    NO GROWTH 3 DAYS Performed at [facility], [address], [place], [place] [address]    Report Status PENDING  Incomplete    Culture, blood (routine x 2) Call MD if unable to obtain prior to antibiotics being given     Status: None (Preliminary result)   Collection Time: 10/09/18  4:33 PM  Result Value Ref Range Status   Specimen Description BLOOD LEFT HAND  Final   Special Requests   Final    BOTTLES DRAWN AEROBIC ONLY Blood Culture adequate volume   Culture   Final    NO GROWTH 3 DAYS Performed at [facility], [address] [place], [place] [address]    Report Status PENDING  Incomplete  Culture, sputum-assessment     Status: None   Collection Time: 10/09/18  7:20 PM  Result Value Ref Range Status   Specimen Description EXPECTORATED SPUTUM  Final   Special Requests NONE  Final   Sputum evaluation   Final    THIS SPECIMEN IS ACCEPTABLE FOR SPUTUM CULTURE Performed at [facility], [address], [place], [place] [address]    Report Status 10/09/2018 FINAL  Final  Culture, respiratory     Status: None   Collection Time: 10/09/18  7:20 PM  Result Value Ref Range Status   Specimen Description EXPECTORATED SPUTUM  Final   Special Requests NONE Reflexed from [id number]  Final   Gram Stain   Final    ABUNDANT WBC PRESENT, PREDOMINANTLY PMN MODERATE GRAM POSITIVE COCCI RARE GRAM POSITIVE RODS    Culture   Final    FEW Consistent with normal respiratory flora. Performed at [facility], [address], [place], [place] [address]    Report Status 10/12/2018 FINAL  Final    Radiology Reports Dg Chest 2 View  Result Date: 10/09/2018 CLINICAL DATA:  Congestion, cough, shortness of breath EXAM: CHEST - 2 VIEW COMPARISON:  Portable chest x-ray of 09/06/2017 FINDINGS: There is opacity posteriorly at the medial left lung base consistent with left lower lobe pneumonia. A small left pleural effusion cannot be excluded. The right lung appears clear. Cardiomegaly is stable. Median sternotomy sutures are noted from prior CABG. There are degenerative changes in the mid to lower thoracic spine.  IMPRESSION:  1. Left lower lobe opacity consistent with pneumonia. 2. Stable cardiomegaly. Electronically Signed   By: Dwyane DeePaul  Barry M.D.   On: 10/09/2018 10:32   Ct Head Wo Contrast  Result Date: 10/09/2018 CLINICAL DATA:  Altered level of consciousness. EXAM: CT HEAD WITHOUT CONTRAST TECHNIQUE: Contiguous axial images were obtained from the base of the skull through the vertex without intravenous contrast. COMPARISON:  11/21/2014 FINDINGS: Brain: No evidence of acute infarction, hemorrhage, hydrocephalus, extra-axial collection or mass lesion/mass effect. There is marked diffuse low-attenuation within the subcortical and periventricular white matter compatible with chronic microvascular disease. Chronic brainstem lacunar infarct identified. Prominence of the sulci and ventricles identified compatible with brain atrophy. Vascular: No hyperdense vessel or unexpected calcification. Skull: Normal. Negative for fracture or focal lesion. Sinuses/Orbits: There are postoperative changes from bilateral ethmoidectomy and median antrectomy. Chronic mucosal thickening involving the frontal sinuses and sphenoid sinus and bilateral maxillary sinuses noted. The mastoid air cells appear clear. Other: None IMPRESSION: 1. Chronic small vessel ischemic disease and brain atrophy. No acute intracranial abnormality noted. 2. Previous sinus surgery with chronic sinus inflammation. Electronically Signed   By: Signa Kellaylor  Stroud M.D.   On: 10/09/2018 11:05    Time Spent in minutes  25   Breleigh Carpino M.D on 10/13/2018 at 11:05 AM  Between 7am to 7pm - Pager - [phone]  After 7pm go to [email or web address] - password [email or web address]  [facility] -  Office  [phone]

## 2018-10-13 NOTE — Progress Notes (Signed)
Patient gets restless and confused throughout the night. Patient easily reoriented and distracted. Will continue to monitor.

## 2018-10-14 ENCOUNTER — Other Ambulatory Visit: Payer: Self-pay | Admitting: Internal Medicine

## 2018-10-14 DIAGNOSIS — J181 Lobar pneumonia, unspecified organism: Secondary | ICD-10-CM | POA: Diagnosis present

## 2018-10-14 DIAGNOSIS — I5043 Acute on chronic combined systolic (congestive) and diastolic (congestive) heart failure: Secondary | ICD-10-CM | POA: Diagnosis present

## 2018-10-14 DIAGNOSIS — E876 Hypokalemia: Secondary | ICD-10-CM | POA: Diagnosis not present

## 2018-10-14 DIAGNOSIS — N401 Enlarged prostate with lower urinary tract symptoms: Secondary | ICD-10-CM

## 2018-10-14 LAB — CULTURE, BLOOD (ROUTINE X 2)
Culture: NO GROWTH
Culture: NO GROWTH
SPECIAL REQUESTS: ADEQUATE
SPECIAL REQUESTS: ADEQUATE

## 2018-10-14 LAB — CBC
HEMATOCRIT: 32.3 % — AB (ref 39.0–52.0)
Hemoglobin: 11 g/dL — ABNORMAL LOW (ref 13.0–17.0)
MCH: 30 pg (ref 26.0–34.0)
MCHC: 34.1 g/dL (ref 30.0–36.0)
MCV: 88 fL (ref 80.0–100.0)
Platelets: 231 10*3/uL (ref 150–400)
RBC: 3.67 MIL/uL — AB (ref 4.22–5.81)
RDW: 14.3 % (ref 11.5–15.5)
WBC: 11.3 10*3/uL — ABNORMAL HIGH (ref 4.0–10.5)
nRBC: 0 % (ref 0.0–0.2)

## 2018-10-14 LAB — GLUCOSE, CAPILLARY
Glucose-Capillary: 127 mg/dL — ABNORMAL HIGH (ref 70–99)
Glucose-Capillary: 140 mg/dL — ABNORMAL HIGH (ref 70–99)

## 2018-10-14 LAB — MAGNESIUM: Magnesium: 1.6 mg/dL — ABNORMAL LOW (ref 1.7–2.4)

## 2018-10-14 MED ORDER — AMOXICILLIN-POT CLAVULANATE 875-125 MG PO TABS: 1.0000 | ORAL_TABLET | Freq: Two times a day (BID) | ORAL | 0 refills | Status: AC

## 2018-10-14 MED ORDER — MAGNESIUM SULFATE 2 GM/50ML IV SOLN
2.0000 g | Freq: Once | INTRAVENOUS | Status: AC
  Administered 2018-10-14: 2 g via INTRAVENOUS
  Filled 2018-10-14: qty 50

## 2018-10-14 MED ORDER — POTASSIUM CHLORIDE CRYS ER 20 MEQ PO TBCR: 40.0000 meq | EXTENDED_RELEASE_TABLET | Freq: Every day | ORAL | 0 refills | Status: DC

## 2018-10-14 MED ORDER — BASAGLAR KWIKPEN 100 UNIT/ML ~~LOC~~ SOPN: 15.0000 [IU] | PEN_INJECTOR | Freq: Every day | SUBCUTANEOUS | 0 refills | Status: AC

## 2018-10-14 NOTE — Clinical Social Work Placement (Signed)
   CLINICAL SOCIAL WORK PLACEMENT  NOTE  Date:  10/14/2018  Patient Details  Name: [name]. MRN: [id number] Date of Birth: [birth year]  Clinical Social Work is seeking post-discharge placement for this patient at the Skilled  Nursing Facility level of care (*CSW will initial, date and re-position this form in  chart as items are completed):      Patient/family provided with [facility] Clinical Social Work Department's list of facilities offering this level of care within the geographic area requested by the patient (or if unable, by the patient's family).      Patient/family informed of their freedom to choose among providers that offer the needed level of care, that participate in Medicare, Medicaid or managed care program needed by the patient, have an available bed and are willing to accept the patient.      Patient/family informed of [facility]'s ownership interest in [facility] and [facility], as well as of the fact that they are under no obligation to receive care at these facilities.  PASRR submitted to EDS on 10/13/18     PASRR number received on       Existing PASRR number confirmed on 10/13/18     FL2 transmitted to all facilities in geographic area requested by pt/family on 10/13/18     FL2 transmitted to all facilities within larger geographic area on       Patient informed that his/her managed care company has contracts with or will negotiate with certain facilities, including the following:        Yes   Patient/family informed of bed offers received.  Patient chooses bed at [facility], [facility]     Physician recommends and patient chooses bed at      Patient to be transferred to [facility] [place] on  .  Patient to be transferred to facility by [facility]     Patient family notified on 10/14/18 of transfer.  Name of family member notified:  [name] Teehan     PHYSICIAN Please prepare prescriptions, Please sign DNR     Additional Comment:     _______________________________________________ Margarito Liner, LCSW 10/14/2018, 11:34 AM

## 2018-10-14 NOTE — Clinical Social Work Note (Signed)
Left voicemail for [facility] admissions coordinator to notify her that discharge summary is in and see when patient can leave the hospital. Per RN, family plans on transporting him by car.  Charlynn Court, CSW [phone]

## 2018-10-14 NOTE — Progress Notes (Signed)
RN called report to SNF.  

## 2018-10-14 NOTE — Discharge Summary (Addendum)
Physician Discharge Summary  [name]. HQI:[id number] DOB: 1926/07/23 DOA: 10/09/2018  PCP: Colon Branch, MD  Admit date: 10/09/2018 Discharge date: 10/14/2018  Admitted From: Home Disposition: SNF  Recommendations for Outpatient Follow-up:  1. Follow up with MD at SNF in 1 week.  Please monitor BMET in 3-4 days, daily weight and I/O.  Patient will complete 7-day course of antibiotics after 1/1. 2. Cardiology will arrange outpatient follow-up.  Home Health: None Equipment/Devices: Per therapy at the facility  Discharge Condition: Fair CODE STATUS: DNR Diet recommendation: Heart Healthy / Carb Modified    Discharge Diagnoses:  Principal Problem:   Acute respiratory failure with hypoxia (HCC)   Active Problems:   Dyslipidemia, goal LDL below 70   BPH (benign prostatic hyperplasia)   Insulin dependent diabetes mellitus with complications (HCC)   Sleep apnea- C-pap   Carotid artery disease (HCC)   Essential hypertension   Dementia (HCC)   Acute on chronic combined systolic and diastolic CHF (congestive heart failure) (HCC)   Lobar pneumonia, unspecified organism ([place])   Hypokalemia  Brief narrative/HPI 84 year old male with history of CAD status post CABG and stent, recent NSTEMI in November 1324, chronic systolic CHF with EF of 40-10%, hypertension, hyperlipidemia and diabetes mellitus.  Patient was doing well after hospital discharge, had some urinary symptoms and treated for possible prostatitis with ciprofloxacin about 2 weeks back by his PCP.  1 week prior to admission he started having cough with congestion and was diagnosed to have acute laryngitis at the urgent care.  On the night prior to admission he became confused, fell at home, was found to be hypoxic with O2 sat in the 80s.  Chest x-ray showed left lower lobe pneumonia.  Hospital course prolonged with acute respiratory failure with hypoxia and acute on chronic systolic CHF.  Hospital course  Principal  Problem:   Acute respiratory failure with hypoxia (HCC) Combination of acute on chronic systolic CHF and left lobar/healthcare associated pneumonia. Maintaining O2 sat on room air but occasionally needs 2 L on getting out of bed.  Continue home inhaler and PRN nebs.  Active Problems: SIRS secondary to Left lobar/healthcare associated strep pneumonia Received IV vancomycin and cefepime on admission, transition to IV Rocephin (day6/7).  Blood cultures negative, urine strep antigen positive and sputum culture growing gram-negative rods. PRN antitussives. Discharged on oral Augmentin for 1 more day to complete 7-day course of antibiotic.  Acute on chronic combined systolic and diastolic CHF 2D echo with EF of 25% with diffuse hypokinesis and grade 2 diastolic dysfunction.  Cardiology consult appreciated, given IV Lasix, now transition to p.o.  Lasix daily.  Diuresing well.  Monitor daily weight and I/os at the facility. Continue Coreg, aspirin, Imdur and lisinopril.  CAD/carotid artery disease Continue aspirin, Plavix and statin.  Patient hospitalized 1 month back with NSTEMI and underwent RCA SVG PCI drug-eluting stent.  No chest pain symptoms.  BPH Continue finasteride and Flomax.  Required Foley while in the hospital and removed without voiding difficulty.  acute metabolic encephalopathy  secondary  to underlying infection and acute respiratory failure    Insulin dependent diabetes mellitus with complications (HCC) CBG stable after increasing Lantus dose to 15 units daily.  Continue metformin.    Essential hypertension Stable.  Continue beta-blocker and ACE inhibitor.    Dementia with sundowning ([place]) Continue bedtime melatonin.    Hypokalemia Continue daily potassium supplement.  Replenished low magnesium.  Generalized weakness Seen by PT who recommends SNF.  Wife agrees  with the plan.  OSA Not using CPAP.   Family Communication  : Wife and grandson at  bedside  Disposition Plan   SNF  Consults  : Cardiology  Procedures  : 2d echo  Discharge Instructions   Allergies as of 10/14/2018      Reactions   Ativan [lorazepam] Other (See Comments)   "Made him crazy"   Colesevelam Other (See Comments)   Pancreatitis (??)   Ezetimibe-simvastatin Other (See Comments)   Vytorin caused pancreatitis      Medication List    TAKE these medications   acetaminophen 500 MG tablet Commonly known as:  TYLENOL Take 1,000 mg by mouth every 6 (six) hours as needed (for pain or headaches).   Adjustable Lancing Device Misc Reported on 04/30/2016   albuterol 108 (90 Base) MCG/ACT inhaler Commonly known as:  PROVENTIL HFA;VENTOLIN HFA Inhale 2 puffs into the lungs every 6 (six) hours as needed for wheezing or shortness of breath.   Alcohol Prep 70 % Pads Reported on 04/30/2016   amoxicillin-clavulanate 875-125 MG tablet Commonly known as:  AUGMENTIN Take 1 tablet by mouth 2 (two) times daily for 1 day. Start taking on:  October 15, 2018   aspirin 81 MG EC tablet Take 2 tablets (162 mg total) by mouth daily.   BASAGLAR KWIKPEN 100 UNIT/ML Sopn Inject 0.15 mLs (15 Units total) into the skin at bedtime. What changed:  how much to take   blood glucose meter kit and supplies Use as instructed   carvedilol 6.25 MG tablet Commonly known as:  COREG Take 1 tablet (6.25 mg total) by mouth 2 (two) times daily with a meal.   clopidogrel 75 MG tablet Commonly known as:  PLAVIX TAKE 1 TABLET DAILY WITH   BREAKFAST. KEEP OFFICE     VISIT. What changed:  See the new instructions.   ezetimibe 10 MG tablet Commonly known as:  ZETIA TAKE 1 TABLET DAILY. KEEP  OFFICE VISIT What changed:  See the new instructions.   finasteride 5 MG tablet Commonly known as:  PROSCAR Take 1 tablet (5 mg total) by mouth at bedtime.   Fluticasone-Salmeterol 100-50 MCG/DOSE Aepb Commonly known as:  ADVAIR Inhale 1 puff into the lungs 2 (two) times daily.    furosemide 40 MG tablet Commonly known as:  LASIX Take 1 tablet (40 mg total) by mouth daily.   glucose blood test strip Use to test blood sugars twice daily   Grape Seed 100 MG Caps Take 100 mg by mouth daily.   Insulin Pen Needle 32G X 4 MM Misc To use w/ Basaglar   isosorbide mononitrate 30 MG 24 hr tablet Commonly known as:  IMDUR Take 0.5 tablets (15 mg total) by mouth daily.   lisinopril 2.5 MG tablet Commonly known as:  PRINIVIL,ZESTRIL Take 1 tablet (2.5 mg total) by mouth at bedtime.   loperamide 2 MG tablet Commonly known as:  IMODIUM A-D Take 2 mg by mouth 4 (four) times daily as needed for diarrhea or loose stools.   Melatonin 3 MG Tabs Take 2 tablets by mouth at bedtime.   metFORMIN 850 MG tablet Commonly known as:  GLUCOPHAGE Take 850 mg by mouth 2 (two) times daily with a meal.   nitroGLYCERIN 0.4 MG SL tablet Commonly known as:  NITROSTAT Place 1 tablet (0.4 mg total) under the tongue every 5 (five) minutes x 3 doses as needed for chest pain.   pantoprazole 40 MG tablet Commonly known as:  PROTONIX TAKE 1  TABLET DAILY. KEEP  OFFICE VISIT. What changed:  See the new instructions.   potassium chloride SA 20 MEQ tablet Commonly known as:  K-DUR,KLOR-CON Take 2 tablets (40 mEq total) by mouth daily. Start taking on:  October 15, 2018   rosuvastatin 10 MG tablet Commonly known as:  CRESTOR TAKE 1 TABLET DAILY.   tamsulosin 0.4 MG Caps capsule Commonly known as:  FLOMAX Take 1 capsule (0.4 mg total) by mouth daily after supper.   XIIDRA 5 % Soln Generic drug:  Lifitegrast Place 1 drop into the left eye 2 (two) times daily.       Contact information for follow-up providers    Minus Breeding, MD Follow up in 1 month(s).   Specialty:  Cardiology Why:  office will call with appt Contact information: [place] [address] [phone]            Contact information for after-discharge care    Destination     [facility]  Preferred SNF .   Service:  Skilled Nursing Contact information: [place] [address] [phone]                 Allergies  Allergen Reactions  . Ativan [Lorazepam] Other (See Comments)    "Made him crazy"  . Colesevelam Other (See Comments)    Pancreatitis (??)  . Ezetimibe-Simvastatin Other (See Comments)    Vytorin caused pancreatitis        Procedures/Studies: Dg Chest 2 View  Result Date: 10/09/2018 CLINICAL DATA:  Congestion, cough, shortness of breath EXAM: CHEST - 2 VIEW COMPARISON:  Portable chest x-ray of 09/06/2017 FINDINGS: There is opacity posteriorly at the medial left lung base consistent with left lower lobe pneumonia. A small left pleural effusion cannot be excluded. The right lung appears clear. Cardiomegaly is stable. Median sternotomy sutures are noted from prior CABG. There are degenerative changes in the mid to lower thoracic spine. IMPRESSION: 1. Left lower lobe opacity consistent with pneumonia. 2. Stable cardiomegaly. Electronically Signed   By: Ivar Drape M.D.   On: 10/09/2018 10:32   Ct Head Wo Contrast  Result Date: 10/09/2018 CLINICAL DATA:  Altered level of consciousness. EXAM: CT HEAD WITHOUT CONTRAST TECHNIQUE: Contiguous axial images were obtained from the base of the skull through the vertex without intravenous contrast. COMPARISON:  11/21/2014 FINDINGS: Brain: No evidence of acute infarction, hemorrhage, hydrocephalus, extra-axial collection or mass lesion/mass effect. There is marked diffuse low-attenuation within the subcortical and periventricular white matter compatible with chronic microvascular disease. Chronic brainstem lacunar infarct identified. Prominence of the sulci and ventricles identified compatible with brain atrophy. Vascular: No hyperdense vessel or unexpected calcification. Skull: Normal. Negative for fracture or focal lesion. Sinuses/Orbits: There are postoperative  changes from bilateral ethmoidectomy and median antrectomy. Chronic mucosal thickening involving the frontal sinuses and sphenoid sinus and bilateral maxillary sinuses noted. The mastoid air cells appear clear. Other: None IMPRESSION: 1. Chronic small vessel ischemic disease and brain atrophy. No acute intracranial abnormality noted. 2. Previous sinus surgery with chronic sinus inflammation. Electronically Signed   By: Kerby Moors M.D.   On: 10/09/2018 11:05    2D echo Study Conclusions  - Left ventricle: Wall thickness was increased in a pattern of mild   LVH. Systolic function was severely reduced. The estimated   ejection fraction was in the range of 20% to 25%. Diffuse   hypokinesis. - Aortic valve: There was mild stenosis. There was mild   regurgitation. Valve  area (VTI): 1.76 cm^2. Valve area (Vmax):   1.7 cm^2. Valve area (Vmean): 1.62 cm^2. - Left atrium: The atrium was severely dilated. - Right ventricle: The cavity size was mildly dilated. Systolic   function was moderately reduced. - Right atrium: The atrium was mildly dilated. - Tricuspid valve: There was moderate regurgitation. - Pulmonary arteries: Systolic pressure was moderately increased.   PA peak pressure: 52 mm Hg (S).    Subjective: Reported some cough but no overnight events and voiding without difficulty.  Breathing better.  Discharge Exam: Vitals:   10/14/18 0555 10/14/18 0909  BP: (!) 156/80 (!) 158/74  Pulse: 69 68  Resp: 18   Temp: (!) 97.5 F (36.4 C)   SpO2: 94%    Vitals:   10/13/18 1947 10/13/18 2043 10/14/18 0555 10/14/18 0909  BP:  139/69 (!) 156/80 (!) 158/74  Pulse:  69 69 68  Resp:  20 18   Temp:  97.6 F (36.4 C) (!) 97.5 F (36.4 C)   TempSrc:  Oral Oral   SpO2: 98% 100% 94%   Weight:      Height:        General: Elderly male not in distress HEENT: Moist mucosa, supple neck Chest: Clear to auscultation bilaterally CVs: Normal S1-S2, no murmurs GI: Soft, nondistended,  nontender Musculoskeletal: Warm, no edema CNS: Alert and oriented x2-3    The results of significant diagnostics from this hospitalization (including imaging, microbiology, ancillary and laboratory) are listed below for reference.     Microbiology: Recent Results (from the past 240 hour(s))  Culture, blood (routine x 2) Call MD if unable to obtain prior to antibiotics being given     Status: None   Collection Time: 10/09/18  4:16 PM  Result Value Ref Range Status   Specimen Description BLOOD LEFT WRIST  Final   Special Requests   Final    BOTTLES DRAWN AEROBIC ONLY Blood Culture adequate volume   Culture   Final    NO GROWTH 5 DAYS Performed at [facility], [address] [place], [place] [address]    Report Status 10/14/2018 FINAL  Final  Culture, blood (routine x 2) Call MD if unable to obtain prior to antibiotics being given     Status: None   Collection Time: 10/09/18  4:33 PM  Result Value Ref Range Status   Specimen Description BLOOD LEFT HAND  Final   Special Requests   Final    BOTTLES DRAWN AEROBIC ONLY Blood Culture adequate volume   Culture   Final    NO GROWTH 5 DAYS Performed at [facility], [place] [address], [place], [place] [address]    Report Status 10/14/2018 FINAL  Final  Culture, sputum-assessment     Status: None   Collection Time: 10/09/18  7:20 PM  Result Value Ref Range Status   Specimen Description EXPECTORATED SPUTUM  Final   Special Requests NONE  Final   Sputum evaluation   Final    THIS SPECIMEN IS ACCEPTABLE FOR SPUTUM CULTURE Performed at [facility], [place] [address] [place], [place] [address]    Report Status 10/09/2018 FINAL  Final  Culture, respiratory     Status: None   Collection Time: 10/09/18  7:20 PM  Result Value Ref Range Status   Specimen Description EXPECTORATED SPUTUM  Final   Special Requests NONE Reflexed from [id number]  Final   Gram Stain   Final    ABUNDANT WBC PRESENT, PREDOMINANTLY PMN MODERATE  GRAM  POSITIVE COCCI RARE GRAM POSITIVE RODS    Culture   Final    FEW Consistent with normal respiratory flora. Performed at [facility], [place] [address], [place], [place] [address]    Report Status 10/12/2018 FINAL  Final     Labs: BNP (last 3 results) Recent Labs    09/06/18 1250 10/09/18 1003  BNP 2,072.5* >9,563.8*   Basic Metabolic Panel: Recent Labs  Lab 10/09/18 1010  10/10/18 0604 10/10/18 1118 10/11/18 0926 10/12/18 0554 10/13/18 0451 10/14/18 0426  NA 134*  --  133*  --  132* 136 136  --   K 3.3*  --  3.8  --  3.4* 3.6 3.4*  --   CL 95*  --  94*  --  93* 93* 95*  --   CO2 27  --  25  --  _0 --   GLUCOSE 103*  --  224*  --  214* 178* 120*  --   BUN 18  --  28*  --  34* 30* 31*  --   CREATININE 1.08  --  1.13  --  1.05 1.07 0.75  --   CALCIUM 8.9  --  8.4*  --  8.9 8.6* 9.0  --   MG  --    < > 1.1* 2.2 1.9 1.7 1.6* 1.6*   < > = values in this interval not displayed.   Liver Function Tests: Recent Labs  Lab 10/09/18 1010  AST 31  ALT 22  ALKPHOS 48  BILITOT 1.4*  PROT 6.4*  ALBUMIN 3.7   No results for input(s): LIPASE, AMYLASE in the last 168 hours. No results for input(s): AMMONIA in the last 168 hours. CBC: Recent Labs  Lab 10/09/18 1010 10/10/18 0604 10/11/18 0926 10/12/18 0554 10/13/18 0451 10/14/18 0426  WBC 20.1* 24.5* 21.0* 15.7* 12.7* 11.3*  NEUTROABS 18.1* 23.0*  --   --   --   --   HGB 11.8* 10.8* 10.9* 10.6* 10.8* 11.0*  HCT 35.3* 32.3* 32.8* 32.4* 31.6* 32.3*  MCV 89.1 89.0 88.9 89.8 89.0 88.0  PLT 186 180 191 213 239 231   Cardiac Enzymes: No results for input(s): CKTOTAL, CKMB, CKMBINDEX, TROPONINI in the last 168 hours. BNP: Invalid input(s): POCBNP CBG: Recent Labs  Lab 10/13/18 0817 10/13/18 1146 10/13/18 1706 10/13/18 2204 10/14/18 0741  GLUCAP 102* 186* 291* 312* 127*   D-Dimer No results for input(s): DDIMER in the last 72 hours. Hgb A1c No results for input(s): HGBA1C in the last 72 hours. Lipid  Profile No results for input(s): CHOL, HDL, LDLCALC, TRIG, CHOLHDL, LDLDIRECT in the last 72 hours. Thyroid function studies No results for input(s): TSH, T4TOTAL, T3FREE, THYROIDAB in the last 72 hours.  Invalid input(s): FREET3 Anemia work up No results for input(s): VITAMINB12, FOLATE, FERRITIN, TIBC, IRON, RETICCTPCT in the last 72 hours. Urinalysis    Component Value Date/Time   COLORURINE YELLOW 10/09/2018 1610   APPEARANCEUR CLEAR 10/09/2018 1610   LABSPEC 1.019 10/09/2018 1610   PHURINE 5.0 10/09/2018 1610   GLUCOSEU 50 (A) 10/09/2018 1610   GLUCOSEU NEGATIVE 09/24/2018 1450   HGBUR SMALL (A) 10/09/2018 1610   BILIRUBINUR NEGATIVE 10/09/2018 1610   KETONESUR NEGATIVE 10/09/2018 1610   PROTEINUR 30 (A) 10/09/2018 1610   UROBILINOGEN 0.2 09/24/2018 1450   NITRITE NEGATIVE 10/09/2018 1610   LEUKOCYTESUR NEGATIVE 10/09/2018 1610   Sepsis Labs Invalid input(s): PROCALCITONIN,  WBC,  LACTICIDVEN Microbiology Recent Results (from the past  240 hour(s))  Culture, blood (routine x 2) Call MD if unable to obtain prior to antibiotics being given     Status: None   Collection Time: 10/09/18  4:16 PM  Result Value Ref Range Status   Specimen Description BLOOD LEFT WRIST  Final   Special Requests   Final    BOTTLES DRAWN AEROBIC ONLY Blood Culture adequate volume   Culture   Final    NO GROWTH 5 DAYS Performed at [facility], [place] [address] [place], [place] [address]    Report Status 10/14/2018 FINAL  Final  Culture, blood (routine x 2) Call MD if unable to obtain prior to antibiotics being given     Status: None   Collection Time: 10/09/18  4:33 PM  Result Value Ref Range Status   Specimen Description BLOOD LEFT HAND  Final   Special Requests   Final    BOTTLES DRAWN AEROBIC ONLY Blood Culture adequate volume   Culture   Final    NO GROWTH 5 DAYS Performed at [facility], [place] [address] [place], [place] [address]    Report Status 10/14/2018 FINAL  Final   Culture, sputum-assessment     Status: None   Collection Time: 10/09/18  7:20 PM  Result Value Ref Range Status   Specimen Description EXPECTORATED SPUTUM  Final   Special Requests NONE  Final   Sputum evaluation   Final    THIS SPECIMEN IS ACCEPTABLE FOR SPUTUM CULTURE Performed at [facility], [place] [address] [place], [place] [address]    Report Status 10/09/2018 FINAL  Final  Culture, respiratory     Status: None   Collection Time: 10/09/18  7:20 PM  Result Value Ref Range Status   Specimen Description EXPECTORATED SPUTUM  Final   Special Requests NONE Reflexed from [id number]  Final   Gram Stain   Final    ABUNDANT WBC PRESENT, PREDOMINANTLY PMN MODERATE GRAM POSITIVE COCCI RARE GRAM POSITIVE RODS    Culture   Final    FEW Consistent with normal respiratory flora. Performed at [facility], [place] [address], [place], [place] [address]    Report Status 10/12/2018 FINAL  Final     Time coordinating discharge: 35 minutes  SIGNED:   Louellen Molder, MD  [facility] 10/14/2018, 9:28 AM Pager   If 7PM-7AM, please contact night-coverage [email or web address] Password [email or web address]

## 2018-10-14 NOTE — Clinical Social Work Note (Signed)
CSW facilitated patient discharge including contacting patient family and facility to confirm patient discharge plans. Clinical information faxed to facility and family agreeable with plan. CSW arranged ambulance transport via [facility] to [facility] at 2:00. RN to call report prior to discharge [phone] ext [phone]).  CSW will sign off for now as social work intervention is no longer needed. Please consult [place] again if new needs arise.  Charlynn Court, CSW [phone]

## 2018-10-14 NOTE — Consult Note (Signed)
   [facility] CM Inpatient Consult   10/14/2018  Mickie Felger. [birth year] [id number]  Patient screened for high risk score for unplanned readmissions and potential [facility] Care Management needs with Medicare. Chart review reveals patient is for skilled nursing facility. No current [facility] community follow up assessed.  For questions contact:   Charlesetta Shanks, RN BSN CCM [facility]  [phone] business mobile phone Toll free office [phone]

## 2018-10-16 ENCOUNTER — Encounter: Payer: Self-pay | Admitting: Internal Medicine

## 2018-10-17 ENCOUNTER — Telehealth: Payer: Self-pay

## 2018-10-17 NOTE — Telephone Encounter (Signed)
Patient currently in SNF post hospitalization.

## 2018-10-21 ENCOUNTER — Ambulatory Visit: Payer: Medicare Other | Admitting: Internal Medicine

## 2018-10-27 ENCOUNTER — Other Ambulatory Visit: Payer: Self-pay | Admitting: *Deleted

## 2018-10-27 NOTE — Patient Outreach (Signed)
[facility]) Care Management  10/27/2018  [name]. [birth year] [id number]   Facility site visit at [facility] and spoke with [facility] discharge planner  to discuss patient's progress and plan for transitioning to home. Shanda Bumps states patient is scheduled to return home with spouse who is patient's main caregiver.  Patient was admitted to the hospital recently with HF exacerbation and Shanda Bumps feels caregiver support around this and caregiver strain.   Went to patient's room to speak with patient.  Patient, spouse [name], daughter and son in law in room.  Confirmed discharge plan is scheduled for 10/28/18 to return home under the wife's care.  Patient noted to be pleasantly confused.  Spouse denies medication or transportation needs at this time, but did welcome a nurse to call them once home.  Explained [facility] Care Management services and gave spouse [facility] packet with contact information.  Patient and spouse agreed to services.  Spouse gave [phone] as the best number to reach them and stated to please leave a vm.   Referral placed for [facility] RN [facility].  Plan to make [facility] Team member aware of [facility] CM Referral.   Keni Elison RN, BSN [facility]  Post Acute Care Coordinator [phone]) Business Mobile [phone]) Toll free office

## 2018-10-28 ENCOUNTER — Ambulatory Visit: Payer: Medicare Other | Admitting: Internal Medicine

## 2018-10-29 ENCOUNTER — Other Ambulatory Visit: Payer: Self-pay

## 2018-10-29 NOTE — Patient Outreach (Signed)
New referral:  Discharged from [facility] on 10/28/2018. Primary MD: Dr. Drue Novel. Office does transition of care.  Placed call to patient and spoke to wife [name], who is contact for patient. Wife reports that patient is doing pretty well. Wife reports that patient does not want to walk with walker.  Reports BP remains low.  Wife reports patient is not currently weighing states that he has difficulty getting onto scale.  Wife reports that patient has follow up planned with primary MD on 123/2020.  Wife states that patient has all his medications and she is in charge of managing his medications.   Reviewed reason for call and offered home visit for 11/04/2018. Wife agreed. Confirmed address. Provided my contact phone number and name.  Full assessment and care plan to follow after home visit with patient.  PLAN: Initial home visit for 11/04/2018.  Rowe Pavy, RN, BSN, CEN [facility] [phone]

## 2018-10-31 ENCOUNTER — Telehealth: Payer: Self-pay | Admitting: Internal Medicine

## 2018-10-31 NOTE — Telephone Encounter (Signed)
Copied from CRM [id number]. Topic: Quick Communication - Home Health Verbal Orders >> Oct 31, 2018  9:48 AM Maia Petties wrote: Caller/Agency: Leandrew Koyanagi PT @ [facility] Callback Number: [phone], secure VM if not available Requesting OT/PT/Skilled Nursing/Social Work: PT Frequency: resumption of care today for PT, requesting VO for PT 2 week 2, 1 week 1 - then will have to recert

## 2018-11-03 NOTE — Telephone Encounter (Signed)
Spoke with Beckham and gave verbal orders.  PT stated he will call back in about 30 to 60 days for more verbal orders after this physical therapy session

## 2018-11-03 NOTE — Telephone Encounter (Signed)
Noted, thank you

## 2018-11-04 ENCOUNTER — Emergency Department (HOSPITAL_COMMUNITY)
Admission: EM | Admit: 2018-11-04 | Discharge: 2018-11-04 | Disposition: A | Discharge status: Home / Self Care | Payer: Medicare Other | Attending: Emergency Medicine | Admitting: Emergency Medicine

## 2018-11-04 ENCOUNTER — Ambulatory Visit: Payer: Self-pay

## 2018-11-04 ENCOUNTER — Encounter (HOSPITAL_COMMUNITY): Payer: Self-pay | Admitting: *Deleted

## 2018-11-04 ENCOUNTER — Other Ambulatory Visit: Payer: Self-pay

## 2018-11-04 DIAGNOSIS — Z79899 Other long term (current) drug therapy: Secondary | ICD-10-CM | POA: Insufficient documentation

## 2018-11-04 DIAGNOSIS — F0390 Unspecified dementia without behavioral disturbance: Secondary | ICD-10-CM | POA: Diagnosis not present

## 2018-11-04 DIAGNOSIS — I959 Hypotension, unspecified: Secondary | ICD-10-CM

## 2018-11-04 DIAGNOSIS — E119 Type 2 diabetes mellitus without complications: Secondary | ICD-10-CM | POA: Diagnosis not present

## 2018-11-04 DIAGNOSIS — N182 Chronic kidney disease, stage 2 (mild): Secondary | ICD-10-CM | POA: Insufficient documentation

## 2018-11-04 DIAGNOSIS — I5042 Chronic combined systolic (congestive) and diastolic (congestive) heart failure: Secondary | ICD-10-CM | POA: Insufficient documentation

## 2018-11-04 DIAGNOSIS — I130 Hypertensive heart and chronic kidney disease with heart failure and stage 1 through stage 4 chronic kidney disease, or unspecified chronic kidney disease: Secondary | ICD-10-CM | POA: Insufficient documentation

## 2018-11-04 LAB — CBC WITH DIFFERENTIAL/PLATELET
Abs Immature Granulocytes: 0 10*3/uL (ref 0.00–0.07)
Basophils Absolute: 0 10*3/uL (ref 0.0–0.1)
Basophils Relative: 0 %
Eosinophils Absolute: 2.1 10*3/uL — ABNORMAL HIGH (ref 0.0–0.5)
Eosinophils Relative: 27 %
HCT: 31.3 % — ABNORMAL LOW (ref 39.0–52.0)
Hemoglobin: 10.2 g/dL — ABNORMAL LOW (ref 13.0–17.0)
Lymphocytes Relative: 13 %
Lymphs Abs: 1 10*3/uL (ref 0.7–4.0)
MCH: 29.7 pg (ref 26.0–34.0)
MCHC: 32.6 g/dL (ref 30.0–36.0)
MCV: 91.3 fL (ref 80.0–100.0)
Monocytes Absolute: 0.3 10*3/uL (ref 0.1–1.0)
Monocytes Relative: 4 %
Neutro Abs: 4.4 10*3/uL (ref 1.7–7.7)
Neutrophils Relative %: 56 %
PLATELETS: 221 10*3/uL (ref 150–400)
RBC: 3.43 MIL/uL — AB (ref 4.22–5.81)
RDW: 13.7 % (ref 11.5–15.5)
WBC: 7.9 10*3/uL (ref 4.0–10.5)
nRBC: 0 % (ref 0.0–0.2)
nRBC: 0 /100 WBC

## 2018-11-04 LAB — BASIC METABOLIC PANEL
Anion gap: 13 (ref 5–15)
BUN: 19 mg/dL (ref 8–23)
CO2: 24 mmol/L (ref 22–32)
Calcium: 8.6 mg/dL — ABNORMAL LOW (ref 8.9–10.3)
Chloride: 95 mmol/L — ABNORMAL LOW (ref 98–111)
Creatinine, Ser: 0.89 mg/dL (ref 0.61–1.24)
GFR calc Af Amer: >60 mL/min (ref >60)
GLUCOSE: 110 mg/dL — AB (ref 70–99)
POTASSIUM: 4 mmol/L (ref 3.5–5.1)
Sodium: 132 mmol/L — ABNORMAL LOW (ref 135–145)

## 2018-11-04 NOTE — Discharge Instructions (Signed)
It was my pleasure taking care of you today!   Keep your scheduled appointment with Dr. Drue Novel on Thursday.   Do not take your Lisinopril or Carvedilol until you see Dr. Drue Novel. Take half (20mg ) of your Lasix (furosemide) until you see Dr. Drue Novel.  Continue to measure your blood pressures throughout the day.  Return to emergency department if you pass out, develop chest pain or trouble breathing, blood pressures become very high or very low, new or worsening symptoms develop or you have any additional concerns.

## 2018-11-04 NOTE — ED Provider Notes (Signed)
[name] Provider Note   CSN: [id number] Arrival date & time: 11/04/18  1227     History   Chief Complaint Chief Complaint  Patient presents with  . Hypotension    HPI [name]. is a 85 y.o. male.  The history is provided by the patient and the spouse.   [name]. is a 85 y.o. male  with a PMH of hypertension, hyperlipidemia, CKD, CAD, IV days who presents to the Emergency Department at recommendation of physical therapist due to concerns of hypotension.  Majority of history provided by wife at bedside.  She states that he has been recovering from pneumonia and recently discharged from rehab facility.  Home health services were set up and physical therapy came out today for the first time.  She has been monitoring his blood pressures since the 15th.  Please see under HPI for record of these.  Physical therapy came to the house and took his blood pressure.  It was noted to be 74/38.  EMS was called due to concerns for his blood pressure.  Patient denies any complaints.  No chest pain or shortness of breath.  Does not feel lightheaded or weak.    15th: 85/48, 86/49, 78/45 16th: 96/48 17th: 87/45, 77/46, 70/41 18th: 86/45, 82/39, 90/50 19th: 77/45, 84/44, 95/54   Past Medical History:  Diagnosis Date  . Asthma    pt denies this hx on 11/02/2013  . BPH (benign prostatic hyperplasia)   . CAD (coronary artery disease)    a. s/p CABG 1997. b. s/p DES to SVG-PDA 2013. c. NSTEMI 08/2018 s/p DES to SVG-PDA; residual disease managed medically.  . Chronic combined systolic and diastolic CHF (congestive heart failure) ([place]) 09/15/2012  . CKD (chronic kidney disease), stage II   . Colonic polyp   . Dementia ([place])   . Exertional shortness of breath   . GERD (gastroesophageal reflux disease)   . Hearing loss   . History of pancreatitis    pt denies this hx on 11/02/2013  . Hyperlipidemia   . Hypertension   . Intrinsic asthma  06/18/2007   Qualifier: Diagnosis of  By: Dance CMA ([place]), Kim    . Ischemic cardiomyopathy 09/16/2012   EF 25%- improved to 45% 3/14  . Mild dilation of ascending aorta (HCC)   . Myocardial infarction ([place]) 1997  . NSTEMI (non-ST elevated myocardial infarction) ([place]) 09/06/2018  . Obstructed, uropathy   . Pulmonary hypertension ([place])   . PVD (peripheral vascular disease) (HCC)    moderate bilat carotid disease  . Type II diabetes mellitus ([place])   . Vasovagal syncope     Patient Active Problem List   Diagnosis Date Noted  . Acute on chronic combined systolic and diastolic CHF (congestive heart failure) ([place]) 10/14/2018  . Lobar pneumonia, unspecified organism ([place]) 10/14/2018  . Hypokalemia 10/14/2018  . Acute respiratory failure with hypoxia ([place]) 10/09/2018  . Urinary retention 09/10/2018  . NSTEMI (non-ST elevated myocardial infarction) ([place]) 09/06/2018  . Dementia ([place]) 11/26/2016  . Essential hypertension 07/30/2016  . PCP NOTES >>>>>>>>> 11/28/2015  . Chronic combined systolic and diastolic CHF (congestive heart failure) ([place]) 07/08/2015  . Fatigue 06/01/2015  . Annual physical exam 08/04/2014  . Carotid artery disease ([place]) 12/25/2013  . Syncope 11/02/2013  . Left facial numbness 11/02/2013  . Sleep apnea- C-pap   . Ischemic cardiomyopathy 09/16/2012  . S/P angioplasty with stent  09/16/2012  . Insulin dependent diabetes mellitus with  complications ([place]) 34/19/3790  . BENIGN POSITIONAL VERTIGO 03/09/2010  . 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 Dyslipidemia, goal LDL below 70 06/18/2007  . Hx of CABG 06/18/2007  . ALLERGIC RHINITIS 06/18/2007  . Asthma, chronic 06/18/2007  . BPH (benign prostatic hyperplasia) 06/18/2007  . PANCREATITIS, HX OF 06/18/2007  . COLONIC POLYPS, HX OF 06/18/2007    Past Surgical History:  Procedure Laterality Date  . CATARACT EXTRACTION W/ INTRAOCULAR LENS  IMPLANT, BILATERAL Bilateral ~ 1970  . CHOLECYSTECTOMY  2000's  .  CORONARY ANGIOPLASTY WITH STENT PLACEMENT  Dec 2013   DES to SVG-CFX/PDA  . CORONARY ARTERY BYPASS GRAFT  1997   "CABG X 5"  . CORONARY STENT INTERVENTION N/A 09/08/2018   Procedure: CORONARY STENT INTERVENTION;  Surgeon: Lorretta Harp, MD;  Location: [facility];  Service: Cardiovascular;  Laterality: N/A;  . INGUINAL HERNIA REPAIR Bilateral ~ 1950  . LEFT HEART CATH AND CORS/GRAFTS ANGIOGRAPHY N/A 09/08/2018   Procedure: LEFT HEART CATH AND CORS/GRAFTS ANGIOGRAPHY;  Surgeon: Lorretta Harp, MD;  Location: [facility];  Service: Cardiovascular;  Laterality: N/A;  . LEFT HEART CATHETERIZATION WITH CORONARY/GRAFT ANGIOGRAM N/A 09/16/2012   Procedure: LEFT HEART CATHETERIZATION WITH Beatrix Fetters;  Surgeon: Lorretta Harp, MD;  [facility] C[facility];  Service: Cardiovascular;  Laterality: N/A;  . NASAL SEPTUM SURGERY  2007  . RETINAL DETACHMENT SURGERY Left ?1995  . SHOULDER OPEN ROTATOR CUFF REPAIR Right         Home Medications    Prior to Admission medications   Medication Sig Start Date End Date Taking? Authorizing Provider  acetaminophen (TYLENOL) 500 MG tablet Take 1,000 mg by mouth every 6 (six) hours as needed (for pain or headaches).     [provider]  albuterol (PROVENTIL HFA;VENTOLIN HFA) 108 (90 Base) MCG/ACT inhaler Inhale 2 puffs into the lungs every 6 (six) hours as needed for wheezing or shortness of breath. 08/29/18   Colon Branch, MD  Alcohol Swabs (ALCOHOL PREP) 70 % PADS Reported on 04/30/2016 02/23/14   [provider]  aspirin EC 81 MG EC tablet Take 2 tablets (162 mg total) by mouth daily. 09/18/12   Erlene Quan, PA-C  Blood Glucose Monitoring Suppl (BLOOD GLUCOSE METER) kit Use as instructed 04/08/13   Norins, Heinz Knuckles, MD  carvedilol (COREG) 6.25 MG tablet Take 1 tablet (6.25 mg total) by mouth 2 (two) times daily with a meal. 09/10/18   Kroeger, Lorelee Cover., PA-C  clopidogrel (PLAVIX) 75 MG tablet TAKE 1 TABLET  DAILY WITH   BREAKFAST. KEEP OFFICE     VISIT. Patient taking differently: Take 75 mg by mouth daily.  06/30/18   Lorretta Harp, MD  ezetimibe (ZETIA) 10 MG tablet TAKE 1 TABLET DAILY. KEEP  OFFICE VISIT Patient taking differently: Take 10 mg by mouth daily.  06/30/18   Lorretta Harp, MD  finasteride (PROSCAR) 5 MG tablet Take 1 tablet (5 mg total) by mouth at bedtime. Women who are, or may become pregnant should NOT handle crushed or broken tablets. 10/14/18   Colon Branch, MD  Fluticasone-Salmeterol (ADVAIR) 100-50 MCG/DOSE AEPB Inhale 1 puff into the lungs 2 (two) times daily. 08/09/17   Colon Branch, MD  furosemide (LASIX) 40 MG tablet Take 1 tablet (40 mg total) by mouth daily. 09/22/18   Erlene Quan, PA-C  glucose blood test strip Use to test blood sugars twice daily 02/01/14   Norins, Heinz Knuckles, MD  Grape Seed 100 MG CAPS Take 100 mg by mouth daily.     [provider]  Insulin Glargine (BASAGLAR KWIKPEN) 100 UNIT/ML SOPN Inject 0.15 mLs (15 Units total) into the skin at bedtime. 10/14/18   Dhungel, Flonnie Overman, MD  Insulin Pen Needle 32G X 4 MM MISC To use w/ Basaglar 11/29/15   Colon Branch, MD  isosorbide mononitrate (IMDUR) 30 MG 24 hr tablet Take 0.5 tablets (15 mg total) by mouth daily. 09/29/18   Lorretta Harp, MD  Lancet Devices (ADJUSTABLE LANCING DEVICE) MISC Reported on 04/30/2016 02/23/14   [provider]  lisinopril (PRINIVIL,ZESTRIL) 2.5 MG tablet Take 1 tablet (2.5 mg total) by mouth at bedtime. 09/22/18   Erlene Quan, PA-C  loperamide (IMODIUM A-D) 2 MG tablet Take 2 mg by mouth 4 (four) times daily as needed for diarrhea or loose stools.    [provider]  Melatonin 3 MG TABS Take 2 tablets by mouth at bedtime.    [provider]  metFORMIN (GLUCOPHAGE) 850 MG tablet Take 850 mg by mouth 2 (two) times daily with a meal.    [provider]  nitroGLYCERIN (NITROSTAT) 0.4 MG SL tablet Place 1 tablet (0.4 mg total) under the  tongue every 5 (five) minutes x 3 doses as needed for chest pain. 02/04/18   Colon Branch, MD  pantoprazole (PROTONIX) 40 MG tablet TAKE 1 TABLET DAILY. KEEP  OFFICE VISIT. Patient taking differently: Take 40 mg by mouth daily before breakfast.  06/30/18   Lorretta Harp, MD  potassium chloride SA (K-DUR,KLOR-CON) 20 MEQ tablet Take 2 tablets (40 mEq total) by mouth daily. 10/15/18   Dhungel, Nishant, MD  rosuvastatin (CRESTOR) 10 MG tablet TAKE 1 TABLET DAILY. Patient taking differently: Take 10 mg by mouth daily.  06/30/18   Lorretta Harp, MD  tamsulosin (FLOMAX) 0.4 MG CAPS capsule Take 1 capsule (0.4 mg total) by mouth daily after supper. 09/24/18   Colon Branch, MD  XIIDRA 5 % SOLN Place 1 drop into the left eye 2 (two) times daily. 08/27/18   [provider]    Family History Family History  Problem Relation Age of Onset  . Prostate cancer Brother        dx ~ 25 y/o  . Colon cancer Neg Hx     Social History Social History   Tobacco Use  . Smoking status: Never Smoker  . Smokeless tobacco: Never Used  . Tobacco comment: 11/02/2013 "smoked cigars when I was 18 or so; didn't smoke many"  Substance Use Topics  . Alcohol use: Yes    Comment: rare  . Drug use: No     Allergies   Ativan [lorazepam]; Colesevelam; and Ezetimibe-simvastatin   Review of Systems Review of Systems  All other systems reviewed and are negative.    Physical Exam Updated Vital Signs BP 117/62 (BP Location: Right Arm)   Pulse 67   Temp (!) 97.4 F (36.3 C) (Oral)   SpO2 98%   Physical Exam Vitals signs and nursing note reviewed.  Constitutional:      General: He is not in acute distress.    Appearance: He is well-developed.  HENT:     Head: Normocephalic and atraumatic.  Neck:     Musculoskeletal: Neck supple.  Cardiovascular:     Rate and Rhythm: Normal rate and regular rhythm.     Heart sounds: Normal heart sounds. No murmur.  Pulmonary:     Effort: Pulmonary  effort is  normal. No respiratory distress.     Breath sounds: Normal breath sounds.  Abdominal:     General: There is no distension.     Palpations: Abdomen is soft.     Tenderness: There is no abdominal tenderness.  Skin:    General: Skin is warm and dry.  Neurological:     Mental Status: He is alert and oriented to person, place, and time.      ED Treatments / Results  Labs (all labs ordered are listed, but only abnormal results are displayed) Labs Reviewed  CBC WITH DIFFERENTIAL/PLATELET - Abnormal; Notable for the following components:      Result Value   RBC 3.43 (*)    Hemoglobin 10.2 (*)    HCT 31.3 (*)    Eosinophils Absolute 2.1 (*)    All other components within normal limits  BASIC METABOLIC PANEL - Abnormal; Notable for the following components:   Sodium 132 (*)    Chloride 95 (*)    Glucose, Bld 110 (*)    Calcium 8.6 (*)    All other components within normal limits  PATHOLOGIST SMEAR REVIEW    EKG None  Radiology No results found.  Procedures Procedures (including critical care time)  Medications Ordered in ED Medications - No data to display   Initial Impression / Assessment and Plan / ED Course  I have reviewed the triage vital signs and the nursing notes.  Pertinent labs & imaging results that were available during my care of the patient were reviewed by me and considered in my medical decision making (see chart for details).    [name]. is a 85 y.o. male who presents to ED for hypotension noticed by PT when home health PT came by house today. BP was 74/38. See below for blood pressures which wife has been monitoring for the last several days:  15th: 85/48, 86/49, 78/45 16th: 96/48 17th: 87/45, 77/46, 70/41 18th: 86/45, 82/39, 90/50 19th: 77/45, 84/44, 95/54  He does not appear to be symptomatic. He has no complaints.  He is hypotensive, lowest of 98/53 in ED today. It appears his blood pressure has been quite low for several days now. He  is on low doses of carvedilol and lisinopril as well as Lasix and isosorbide.  It appears that he should be holding his carvedilol and lisinopril for systolic blood pressures of less than 100, although wife reports that they have not been doing this.  I spoke with his primary care doctor, Dr.Paz, who recommends holding carvedilol and lisinopril as well as decreasing his Lasix from 40 mg to 20 mg.  He will follow-up with PCP in 2 days for blood pressure rechecks.  Wife will continue to monitor blood pressures at home.  Spoke with the patient and his wife at bedside about plan and they feel comfortable with this.  We discussed reasons to return to the emergency department and all questions were answered.  Patient seen by and discussed with Dr. Regenia Skeeter who agrees with treatment plan.    Final Clinical Impressions(s) / ED Diagnoses   Final diagnoses:  Hypotension, unspecified hypotension type    ED Discharge Orders    None       Ward, Ozella Almond, PA-C 11/04/18 [place], MD 11/05/18 [phone]

## 2018-11-04 NOTE — Telephone Encounter (Signed)
Appt scheduled 11/06/2018 at 1120.

## 2018-11-04 NOTE — Telephone Encounter (Signed)
[facility] form [facility] called to report low BP: 81/46  75/43  78/49  68/38  74/38. No c/o dizziness and pt is alert. Pt HR 72. Pt took his BP meds this morning. Pt has h/o pf having 2 stents placed in November 2019 and was hospitalized with pneumonia after Christmas for 6 days. Called PCP office to discuss pt. Spoke with Lessie Dings and Lessie Dings stated to call 911. Pt's wife verbalized understanding. Pt BP runs 117/65, 122/78. Noted 1 BP 09/22/18 90/50 at a visit with cardiology.  Pt going to [facility]. Routing note to PCP office.  Reason for Disposition . [1] Systolic BP < 90 AND [2] NOT dizzy, lightheaded or weak  Answer Assessment - Initial Assessment Questions 1. BLOOD PRESSURE: "What is the blood pressure?" "Did you take at least two measurements 5 minutes apart?"     68/38 74/38 2. ONSET: "When did you take your blood pressure?"     1053 3. HOW: "How did you obtain the blood pressure?" (e.g., visiting nurse, automatic home BP monitor)     manual 4. HISTORY: "Do you have a history of low blood pressure?" "What is your blood pressure normally?"     no 5. MEDICATIONS: "Are you taking any medications for blood pressure?" If yes: "Have they been changed recently?"     Yes took today-carvedilol in November 2019 6. PULSE RATE: "Do you know what your pulse rate is?"      72 7. OTHER SYMPTOMS: "Have you been sick recently?" "Have you had a recent injury?"     In hospital on Nov 2 has 2  Stents placed  Then went to assited living then in hospital with pneumonia after xmas 6 days  Then back to assisted living living- now at home 8. PREGNANCY: "Is there any chance you are pregnant?" "When was your last menstrual period?"     n/a  Protocols used: LOW BLOOD PRESSURE-A-AH

## 2018-11-04 NOTE — Telephone Encounter (Signed)
FYI. BP today 68/38 nursed home health nurse to call EMS for transport to local hosp.

## 2018-11-04 NOTE — ED Triage Notes (Signed)
Pt in via [facility], per report pt was in rehab post diagnosis with PNA Dec 26th, pt since discharged and has PT come to home and reported that BP was 74/38, EMS found BP to be 110/52, HR regular 66, PT denies pain A&O x4

## 2018-11-04 NOTE — Patient Outreach (Signed)
Care coordination:  Message received from [facility] office that wife cancelled appointment due to patient going to the hospital with hypotension.  PLAN: will follow  For discharge.  Rowe Pavy, RN, BSN, CEN [facility] [employer] [phone]

## 2018-11-04 NOTE — Telephone Encounter (Signed)
P.A.  at the ER called to discuss case: Patient was sent to the ER with low blood pressure. Blood pressure checked at home by the wife are low, 86/40, 90/40. I'm told  labs are okay, he is asymptomatic and current BP is 105/51. We agreed to do the following: Hold lisinopril Hold carvedilol Decrease Lasix 40 mg to half tablet daily. Please call the patient arrange a follow-up with me in 2 to 3 days.

## 2018-11-05 ENCOUNTER — Telehealth: Payer: Self-pay | Admitting: Internal Medicine

## 2018-11-05 LAB — PATHOLOGIST SMEAR REVIEW

## 2018-11-05 NOTE — Telephone Encounter (Signed)
Copied from CRM [id number]. Topic: Quick Communication - See Telephone Encounter >> Nov 05, 2018 12:58 PM Terisa Starr wrote: CRM for notification. See Telephone encounter for: 11/05/18.  Amy with [facility] home [facility] called to let Dr Drue Novel know that he had a fall sometime last week. Patient's wife could not remember exactly when but reports no injuries. She seen the patient today and he is fine

## 2018-11-05 NOTE — Telephone Encounter (Signed)
Noted. Pt has appt 11/06/2018.

## 2018-11-06 ENCOUNTER — Ambulatory Visit (INDEPENDENT_AMBULATORY_CARE_PROVIDER_SITE_OTHER): Payer: Medicare Other | Admitting: Internal Medicine

## 2018-11-06 ENCOUNTER — Encounter: Payer: Self-pay | Admitting: Internal Medicine

## 2018-11-06 ENCOUNTER — Other Ambulatory Visit: Payer: Self-pay

## 2018-11-06 VITALS — BP 102/62 | HR 70 | Temp 97.7°F | Resp 16 | Ht 66.0 in | Wt 155.5 lb

## 2018-11-06 DIAGNOSIS — Z951 Presence of aortocoronary bypass graft: Secondary | ICD-10-CM

## 2018-11-06 DIAGNOSIS — I10 Essential (primary) hypertension: Secondary | ICD-10-CM

## 2018-11-06 DIAGNOSIS — Z794 Long term (current) use of insulin: Secondary | ICD-10-CM

## 2018-11-06 DIAGNOSIS — I5042 Chronic combined systolic (congestive) and diastolic (congestive) heart failure: Secondary | ICD-10-CM

## 2018-11-06 DIAGNOSIS — E785 Hyperlipidemia, unspecified: Secondary | ICD-10-CM | POA: Diagnosis not present

## 2018-11-06 DIAGNOSIS — E1159 Type 2 diabetes mellitus with other circulatory complications: Secondary | ICD-10-CM | POA: Diagnosis not present

## 2018-11-06 DIAGNOSIS — R079 Chest pain, unspecified: Secondary | ICD-10-CM

## 2018-11-06 MED ORDER — ISOSORBIDE MONONITRATE ER 30 MG PO TB24: 15.0000 mg | ORAL_TABLET | Freq: Every day | ORAL | 3 refills | Status: AC

## 2018-11-06 MED ORDER — FINASTERIDE 5 MG PO TABS: 5.0000 mg | ORAL_TABLET | Freq: Every day | ORAL | 3 refills | Status: AC

## 2018-11-06 NOTE — Progress Notes (Signed)
Pre visit review using our clinic review tool, if applicable. No additional management support is needed unless otherwise documented below in the visit note. 

## 2018-11-06 NOTE — Assessment & Plan Note (Signed)
CAD, CHF, HTN. Ambulatory BPs at home were low, home PT therapist  checked his blood pressure and it was in the 70s.  Went to the ER.  BP at the ER was better, he was noted to be asymptomatic. Instructions were to decrease Lasix to 20 mg, hold carvedilol and lisinopril. He is doing that, BPs in the last 24 hours are better, check BPs multiple times a day, only a couple of readings have been < 100s. At this point will stop carvedilol and lisinopril, will consider restart them if needed.  Consequently current meds are: Lasix 40 mg half tablet daily. Imdur 30 mg Continue checking ambulatory BPs, see instructions. Rec a BMP because he is not taking any potassium supplements, will ask cardiology to check, he has an appointment  in few days. DM: Readings range from 106 to 180.  Occasionally over 200.  One time was 98.  No change Follow-up here in 2 months.

## 2018-11-06 NOTE — Patient Instructions (Addendum)
Stop carvedilol and lisinopril  Lasix 40 mg: Half tablet daily  Other medication the same    GO TO THE FRONT DESK Schedule your next appointment   Check up in 2 months    Check the  blood pressure   daily  blood pressure goal  between 110/65 and  135/85. If it is consistently higher or lower, let me know    If there are days that is in the low side is okay as long as he feels well  If the blood pressure drops below 100 definitely let me know.

## 2018-11-06 NOTE — Progress Notes (Signed)
Subjective:    Patient ID: Ryan Plumb., male    DOB: 22-Jan-1926, 85 y.o.   MRN: [id number]  DOS:  11/06/2018 Type of visit - description: ER follow-up  went to the ER 08/05/2019, he was referred her after BP was low at home, when physical therapy checked it was 74/83. At the ER he reported essentially no symptoms, BP upon arrival was 117/62. CBC showed a hemoglobin of 10.2 not far from baseline, sodium 132, creatinine 0.8. Medications were adjusted.  Good compliance. Here for follow-up  Review of Systems Wife reports he is doing well.  No fever chills No chest pain, lower extremity edema or shortness of breath. Mental status at baseline.  Past Medical History:  Diagnosis Date  . Asthma    pt denies this hx on 11/02/2013  . BPH (benign prostatic hyperplasia)   . CAD (coronary artery disease)    a. s/p CABG 1997. b. s/p DES to SVG-PDA 2013. c. NSTEMI 08/2018 s/p DES to SVG-PDA; residual disease managed medically.  . Chronic combined systolic and diastolic CHF (congestive heart failure) ([place]) 09/15/2012  . CKD (chronic kidney disease), stage II   . Colonic polyp   . Dementia ([place])   . Exertional shortness of breath   . GERD (gastroesophageal reflux disease)   . Hearing loss   . History of pancreatitis    pt denies this hx on 11/02/2013  . Hyperlipidemia   . Hypertension   . Intrinsic asthma 06/18/2007   Qualifier: Diagnosis of  By: Dance CMA ([place]), Kim    . Ischemic cardiomyopathy 09/16/2012   EF 25%- improved to 45% 3/14  . Mild dilation of ascending aorta (HCC)   . Myocardial infarction ([place]) 1997  . NSTEMI (non-ST elevated myocardial infarction) ([place]) 09/06/2018  . Obstructed, uropathy   . Pulmonary hypertension ([place])   . PVD (peripheral vascular disease) (HCC)    moderate bilat carotid disease  . Type II diabetes mellitus ([place])   . Vasovagal syncope     Past Surgical History:  Procedure Laterality Date  . CATARACT EXTRACTION W/ INTRAOCULAR LENS  IMPLANT,  BILATERAL Bilateral ~ 1970  . CHOLECYSTECTOMY  2000's  . CORONARY ANGIOPLASTY WITH STENT PLACEMENT  Dec 2013   DES to SVG-CFX/PDA  . CORONARY ARTERY BYPASS GRAFT  1997   "CABG X 5"  . CORONARY STENT INTERVENTION N/A 09/08/2018   Procedure: CORONARY STENT INTERVENTION;  Surgeon: Lorretta Harp, MD;  Location: [facility];  Service: Cardiovascular;  Laterality: N/A;  . INGUINAL HERNIA REPAIR Bilateral ~ 1950  . LEFT HEART CATH AND CORS/GRAFTS ANGIOGRAPHY N/A 09/08/2018   Procedure: LEFT HEART CATH AND CORS/GRAFTS ANGIOGRAPHY;  Surgeon: Lorretta Harp, MD;  Location: [facility];  Service: Cardiovascular;  Laterality: N/A;  . LEFT HEART CATHETERIZATION WITH CORONARY/GRAFT ANGIOGRAM N/A 09/16/2012   Procedure: LEFT HEART CATHETERIZATION WITH Beatrix Fetters;  Surgeon: Lorretta Harp, MD;  Location: [facility] C[facility];  Service: Cardiovascular;  Laterality: N/A;  . NASAL SEPTUM SURGERY  2007  . RETINAL DETACHMENT SURGERY Left ?1995  . SHOULDER OPEN ROTATOR CUFF REPAIR Right     Social History   Socioeconomic History  . Marital status: Married    Spouse name: Not on file  . Number of children: 1  . Years of education: Not on file  . Highest education level: Not on file  Occupational History  . 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 Emergency planning/management officer  strain: Not on file  . Food insecurity:    Worry: Not on file    Inability: Not on file  . Transportation needs:    Medical: Not on file    Non-medical: Not on file  Tobacco Use  . Smoking status: Never Smoker  . Smokeless tobacco: Never Used  . Tobacco comment: 11/02/2013 "smoked cigars when I was 18 or so; didn't smoke many"  Substance and Sexual Activity  . Alcohol use: Yes    Comment: rare  . Drug use: No  . Sexual activity: Not Currently  Lifestyle  . Physical activity:    Days per week: Not on file    Minutes per session: Not on file  . Stress: Not on file  Relationships   . Social connections:    Talks on phone: Not on file    Gets together: Not on file    Attends religious service: Not on file    Active member of club or organization: Not on file    Attends meetings of clubs or organizations: Not on file    Relationship status: Not on file  . Intimate partner violence:    Fear of current or ex partner: Not on file    Emotionally abused: Not on file    Physically abused: Not on file    Forced sexual activity: Not on file  Other Topics Concern  . 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 Always use your most recent med list.        acetaminophen 500 MG tablet Commonly known as:  TYLENOL Take 1,000 mg by mouth every 6 (six) hours as needed (for pain or headaches).   Adjustable Lancing Device Misc Reported on 04/30/2016   albuterol 108 (90 Base) MCG/ACT inhaler Commonly known as:  PROVENTIL HFA;VENTOLIN HFA Inhale 2 puffs into the lungs every 6 (six) hours as needed for wheezing or shortness of breath.   Alcohol Prep 70 % Pads Reported on 04/30/2016   aspirin 81 MG EC tablet Take 2 tablets (162 mg total) by mouth daily.   BASAGLAR KWIKPEN 100 UNIT/ML Sopn Inject 0.15 mLs (15 Units total) into the skin at bedtime.   blood glucose meter kit and supplies Use as instructed   clopidogrel 75 MG tablet Commonly known as:  PLAVIX TAKE 1 TABLET DAILY WITH   BREAKFAST. KEEP OFFICE     VISIT.   ezetimibe 10 MG tablet Commonly known as:  ZETIA TAKE 1 TABLET DAILY. KEEP  OFFICE VISIT    finasteride 5 MG tablet Commonly known as:  PROSCAR Take 1 tablet (5 mg total) by mouth at bedtime. Women who are, or may become pregnant should NOT handle crushed or broken tablets.   Fluticasone-Salmeterol 100-50 MCG/DOSE Aepb Commonly known as:  ADVAIR Inhale 1 puff into the lungs 2 (two) times daily.   furosemide 40 MG tablet Commonly known as:  LASIX  Take 0.5 tablets (20 mg total) by mouth daily.   glucose blood test strip Use to test blood sugars twice daily   Grape Seed 100 MG Caps Take 100 mg by mouth daily.   Insulin Pen Needle 32G X 4 MM Misc To use w/ Basaglar   isosorbide mononitrate 30 MG 24 hr tablet Commonly known as:  IMDUR Take 0.5 tablets (15 mg total) by mouth daily.   loperamide 2 MG tablet Commonly known as:  IMODIUM A-D Take 2 mg by mouth 4 (four) times daily as needed for diarrhea or loose stools.   Melatonin 3 MG Tabs Take 2 tablets by mouth at bedtime.   metFORMIN 850 MG tablet Commonly known as:  GLUCOPHAGE Take 850 mg by mouth 2 (two) times daily with a meal.   nitroGLYCERIN 0.4 MG SL tablet Commonly known as:  NITROSTAT Place 1 tablet (0.4 mg total) under the tongue every 5 (five) minutes x 3 doses as needed for chest pain.   rosuvastatin 10 MG tablet Commonly known as:  CRESTOR TAKE 1 TABLET DAILY.   tamsulosin 0.4 MG Caps capsule Commonly known as:  FLOMAX Take 1 capsule (0.4 mg total) by mouth daily after supper.   XIIDRA 5 % Soln Generic drug:  Lifitegrast Place 1 drop into the left eye 2 (two) times daily.           Objective:   Physical Exam BP 102/62 (BP Location: Right Arm, Patient Position: Sitting, Cuff Size: Small)   Pulse 70   Temp 97.7 F (36.5 C) (Oral)   Resp 16   Ht _0  (1.676 m)   Wt 155 lb 8 oz (70.5 kg)   SpO2 94%   BMI 25.10 kg/m  General:   Well developed, NAD, BMI noted.  HEENT:  Normocephalic . Face symmetric, atraumatic Lungs:  CTA B Normal respiratory effort, no intercostal retractions,  no accessory muscle use. Heart: RRR,  no murmur.  No pretibial edema bilaterally  skin: Not pale. Not jaundice Neurologic:  alert , pleasently demented Speech normal, gait : Assisted by a walker, seems to be doing well Psych--  Behavior appropriate. No anxious or depressed appearing.      Assessment      Assessment Diabetes, no neuropathy as of 10-2015 10-2015: d/c Lantus but had to go back on it 10-2015 , dc Actos (dt CHF) and started metformin Hyperlipidemia Hypertension   Dementia MMSE 18 (11/2016) BPH  CV: --CAD, CABG 5284 --CHF: Systolic, diastolic, EF echocardiogram 2014---45%.  Echo 02/2018: EF 25%  --Peripheral vascular disease Pulmonary -OSA: On CPAP -asthma -  PFTs 06-2017: Obstructive and restrictive disease with + response to bronchodilators Hearing loss  PLAN CAD, CHF, HTN. Ambulatory BPs at home were low, home PT therapist  checked his blood pressure and it was in the 70s.  Went to the ER.  BP at the ER was better, he was noted to be asymptomatic. Instructions were to decrease Lasix to 20 mg, hold carvedilol and lisinopril. He is doing that, BPs in the last 24 hours are better, check BPs multiple times a day, only a couple of readings have been < 100s. At this point will stop carvedilol and lisinopril, will consider restart them if needed.  Consequently current meds are: Lasix 40 mg half tablet daily. Imdur 30 mg Continue checking ambulatory BPs, see instructions. Rec a BMP because he is not taking any potassium supplements, will ask cardiology to check, he has an appointment  in few days. DM: Readings  range from 106 to 180.  Occasionally over 200.  One time was 98.  No change Follow-up here in 2 months.

## 2018-11-06 NOTE — Patient Outreach (Signed)
Telephone outreach:  Placed call to patient to reschedule home visit that was cancelled earlier this week. Spoke with wife who reports patient is doing better. Reports medications were adjusted.  Reports patient saw primary MD today.  Plan: Offered home visit for 11/11/2018 at 0930am.  Wife agreed. Provided my contact information to wife.  Rowe Pavy, RN, BSN, CEN [facility] [phone]

## 2018-11-08 ENCOUNTER — Other Ambulatory Visit: Payer: Self-pay | Admitting: Cardiology

## 2018-11-11 ENCOUNTER — Other Ambulatory Visit: Payer: Self-pay

## 2018-11-12 NOTE — Patient Outreach (Signed)
[facility]) Care Management  11/12/2018  [name]. Nov 12, 1925 [id number]   11/11/2018  0930am  Arrived for home visit. [name] present.  Reviewed [facility] referral from post acute coordinator Ma Rings Minor).  [facility] welcome packet noted on coffee table.  Reviewed with patient and [name] [facility] program and goals.  [name] and patient report that they are currently managing well. Reports that patient weighs daily, follows low salt diet and sees MD on a regular basis. State that they are not currently struggling.  Reports patient is active with [facility] home health for nursing, PT and OT.  [name] and patient report that [facility] sounds like a great program but feel it is unnecessary.  Verbally reviewed heart failure zones and provided a [facility] calendar with written zones attached.   PLAN: patient and [name] request case closure and state they have no needs. Encouraged patient and or [name] to call me if they change their mind. Provided my contact card. Will send case closure letter to MD.   Rowe Pavy, RN, BSN, CEN [facility] Coordinator [phone]

## 2018-11-14 ENCOUNTER — Encounter: Payer: Self-pay | Admitting: Cardiology

## 2018-11-14 ENCOUNTER — Ambulatory Visit (INDEPENDENT_AMBULATORY_CARE_PROVIDER_SITE_OTHER): Payer: Medicare Other | Admitting: Cardiology

## 2018-11-14 VITALS — BP 118/62 | HR 77 | Ht 65.0 in | Wt 157.0 lb

## 2018-11-14 DIAGNOSIS — I255 Ischemic cardiomyopathy: Secondary | ICD-10-CM | POA: Diagnosis not present

## 2018-11-14 DIAGNOSIS — F0150 Vascular dementia without behavioral disturbance: Secondary | ICD-10-CM

## 2018-11-14 DIAGNOSIS — I5042 Chronic combined systolic (congestive) and diastolic (congestive) heart failure: Secondary | ICD-10-CM | POA: Diagnosis not present

## 2018-11-14 DIAGNOSIS — Z951 Presence of aortocoronary bypass graft: Secondary | ICD-10-CM | POA: Diagnosis not present

## 2018-11-14 MED ORDER — ASPIRIN EC 81 MG PO TBEC: 81.0000 mg | DELAYED_RELEASE_TABLET | Freq: Every day | ORAL | Status: AC

## 2018-11-14 NOTE — Patient Instructions (Addendum)
Medication Instructions:  NOT NEEDED If you need a refill on your cardiac medications before your next appointment, please call your pharmacy.   Lab work: BMP TODAY If you have labs (blood work) drawn today and your tests are completely normal, you will receive your results only by: Marland Kitchen MyChart Message (if you have MyChart) OR . A paper copy in the mail If you have any lab test that is abnormal or we need to change your treatment, we will call you to review the results.  Testing/Procedures: NOT NEEDED  Follow-Up: At [facility], you and your health needs are our priority.  As part of our continuing mission to provide you with exceptional heart care, we have created designated Provider Care Teams.  These Care Teams include your primary Cardiologist (physician) and Advanced Practice Providers (APPs -  Physician Assistants and Nurse Practitioners) who all work together to provide you with the care you need, when you need it. . Your physician recommends that you schedule a follow-up appointment  Nov 26, 2018 AT 4 PM .   Any Other Special Instructions Will Be Listed Below (If Applicable).

## 2018-11-14 NOTE — Progress Notes (Signed)
11/14/2018 [name].   07-Mar-1926  [id number]  Primary Physician Colon Branch, MD Primary Cardiologist: Dr Gwenlyn Found  HPI:  The patient is a pleasant 85 year old male who has been followed by Dr. Gwenlyn Found for many years.  He had CABG in 1997. He has IDDM, HTN, and dementia.  In December 2013 he had an SVG to his PDA intervened on with a DES.  Myoview in August 2017 was low risk.  Echo in May 2019 showed an EF of 25-30%.    He presented September 06, 2018 with a NSTEMI.  Catheterization was done which revealed disease in the SVG to PDA.  He also had disease in the SVG to the ramus intermedius but this appeared to be somewhat of a small branch.  He underwent intervention to the SVG to RCA with overlapping DES placement.  The plan was to consider doing a staged intervention for the SVG to RI.  Ultimately it was decided to hold off on this because of multiple comorbidities including dementia and chronic renal insufficiency.  At discharge the patient was placed in [place].  He was seen in the office 09/22/18 in follow-up. His Lasix was reduced to 40 mg daily secondary to low B/P.  He then presented to the ED 10/09/18 with HCAP and CHF.  He was again discharged to [facility] 10/14/18.  He was seen in the ED 11/04/2018 with low B/P though he was basically asymptomatic.  His Coreg and Lisinopril were stopped, he was kept on Lasix 20 mg  And Imdur 15 mg.  He has followed up with his PCP since and these doses have been continued.  In the office today he denies any chest pain or SOB.  His wife brought in a log of his B/P at home.  It usually runs less than 90 systolic till around noon, then over 100 in the PM.     Current Outpatient Medications  Medication Sig Dispense Refill  . acetaminophen (TYLENOL) 500 MG tablet Take 1,000 mg by mouth every 6 (six) hours as needed (for pain or headaches).     Marland Kitchen albuterol (PROVENTIL HFA;VENTOLIN HFA) 108 (90 Base) MCG/ACT inhaler Inhale 2 puffs into the lungs  every 6 (six) hours as needed for wheezing or shortness of breath. 54 g 3  . Alcohol Swabs (ALCOHOL PREP) 70 % PADS Reported on 04/30/2016    . aspirin EC 81 MG EC tablet Take 2 tablets (162 mg total) by mouth daily.    . Blood Glucose Monitoring Suppl (BLOOD GLUCOSE METER) kit Use as instructed 1 each 0  . clopidogrel (PLAVIX) 75 MG tablet TAKE 1 TABLET DAILY WITH   BREAKFAST. KEEP OFFICE     VISIT. (Patient taking differently: Take 75 mg by mouth daily. ) 90 tablet 2  . ezetimibe (ZETIA) 10 MG tablet TAKE 1 TABLET DAILY. KEEP  OFFICE VISIT (Patient taking differently: Take 10 mg by mouth daily. ) 90 tablet 2  . finasteride (PROSCAR) 5 MG tablet Take 1 tablet (5 mg total) by mouth at bedtime. Women who are, or may become pregnant should NOT handle crushed or broken tablets. 90 tablet 3  . Fluticasone-Salmeterol (ADVAIR) 100-50 MCG/DOSE AEPB Inhale 1 puff into the lungs 2 (two) times daily. 180 each 1  . furosemide (LASIX) 40 MG tablet TAKE 1 TABLET BY MOUTH DAILY 30 tablet 6  . glucose blood test strip Use to test blood sugars twice daily 100 each 5  . Grape Seed 100 MG  CAPS Take 100 mg by mouth daily.     . Insulin Glargine (BASAGLAR KWIKPEN) 100 UNIT/ML SOPN Inject 0.15 mLs (15 Units total) into the skin at bedtime. 10 mL 0  . Insulin Pen Needle 32G X 4 MM MISC To use w/ Basaglar 100 each 12  . isosorbide mononitrate (IMDUR) 30 MG 24 hr tablet Take 0.5 tablets (15 mg total) by mouth daily. 45 tablet 3  . Lancet Devices (ADJUSTABLE LANCING DEVICE) MISC Reported on 04/30/2016    . loperamide (IMODIUM A-D) 2 MG tablet Take 2 mg by mouth 4 (four) times daily as needed for diarrhea or loose stools.    . metFORMIN (GLUCOPHAGE) 850 MG tablet Take 850 mg by mouth 2 (two) times daily with a meal.    . nitroGLYCERIN (NITROSTAT) 0.4 MG SL tablet Place 1 tablet (0.4 mg total) under the tongue every 5 (five) minutes x 3 doses as needed for chest pain. 40 tablet 1  . rosuvastatin (CRESTOR) 10 MG tablet TAKE 1  TABLET DAILY. (Patient taking differently: Take 10 mg by mouth daily. ) 90 tablet 2  . tamsulosin (FLOMAX) 0.4 MG CAPS capsule Take 1 capsule (0.4 mg total) by mouth daily after supper. 30 capsule 3  . XIIDRA 5 % SOLN Place 1 drop into the left eye 2 (two) times daily.     No current facility-administered medications for this visit.     Allergies  Allergen Reactions  . Ativan [Lorazepam] Other (See Comments)    "Made him crazy"  . Colesevelam Other (See Comments)    Pancreatitis (??)  . Ezetimibe-Simvastatin Other (See Comments)    Vytorin caused pancreatitis    Past Medical History:  Diagnosis Date  . Asthma    pt denies this hx on 11/02/2013  . BPH (benign prostatic hyperplasia)   . CAD (coronary artery disease)    a. s/p CABG 1997. b. s/p DES to SVG-PDA 2013. c. NSTEMI 08/2018 s/p DES to SVG-PDA; residual disease managed medically.  . Chronic combined systolic and diastolic CHF (congestive heart failure) ([place]) 09/15/2012  . CKD (chronic kidney disease), stage II   . Colonic polyp   . Dementia ([place])   . Exertional shortness of breath   . GERD (gastroesophageal reflux disease)   . Hearing loss   . History of pancreatitis    pt denies this hx on 11/02/2013  . Hyperlipidemia   . Hypertension   . Intrinsic asthma 06/18/2007   Qualifier: Diagnosis of  By: Dance CMA ([place]), Kim    . Ischemic cardiomyopathy 09/16/2012   EF 25%- improved to 45% 3/14  . Mild dilation of ascending aorta (HCC)   . Myocardial infarction ([place]) 1997  . NSTEMI (non-ST elevated myocardial infarction) ([place]) 09/06/2018  . Obstructed, uropathy   . Pulmonary hypertension ([place])   . PVD (peripheral vascular disease) (HCC)    moderate bilat carotid disease  . Type II diabetes mellitus ([place])   . Vasovagal syncope     Social History   Socioeconomic History  . Marital status: Married    Spouse name: Not on file  . Number of children: 1  . Years of education: Not on file  . Highest education level: Not on  file  Occupational History  . 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 Financial resource strain: Not on file  . Food insecurity:    Worry: Not on file    Inability: Not on file  . Transportation needs:  Medical: Not on file    Non-medical: Not on file  Tobacco Use  . Smoking status: Never Smoker  . Smokeless tobacco: Never Used  . Tobacco comment: 11/02/2013 "smoked cigars when I was 18 or so; didn't smoke many"  Substance and Sexual Activity  . Alcohol use: Yes    Comment: rare  . Drug use: No  . Sexual activity: Not Currently  Lifestyle  . Physical activity:    Days per week: Not on file    Minutes per session: Not on file  . Stress: Not on file  Relationships  . Social connections:    Talks on phone: Not on file    Gets together: Not on file    Attends religious service: Not on file    Active member of club or organization: Not on file    Attends meetings of clubs or organizations: Not on file    Relationship status: Not on file  . Intimate partner violence:    Fear of current or ex partner: Not on file    Emotionally abused: Not on file    Physically abused: Not on file    Forced sexual activity: Not on file  Other Topics Concern  . 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 Prostate cancer Brother        dx ~ 59 y/o  . Colon cancer Neg Hx      Review of Systems: General: negative for chills, fever, night sweats or weight changes.  Cardiovascular: negative for chest pain, dyspnea on exertion, edema, orthopnea, palpitations, paroxysmal nocturnal dyspnea or shortness of breath Dermatological: negative for rash Respiratory: negative for cough or wheezing Urologic: negative for hematuria Abdominal: negative for nausea, vomiting,  diarrhea, bright red blood per rectum, melena, or hematemesis Neurologic: negative for visual changes, syncope, or dizziness All other systems reviewed and are otherwise negative except as noted above.    Blood pressure 118/62, pulse 77, height 5' 5" (1.651 m), weight 157 lb (71.2 kg).  General appearance: alert, cooperative and no distress Neck: no carotid bruit and no JVD Lungs: clear to auscultation bilaterally Heart: regular rate and rhythm and 2/6 MR murmur Extremities: no edema Skin: Skin color, texture, turgor normal. No rashes or lesions Neurologic: Grossly normal  EKG NSR, 1st degree AVB, TWI 1, AVL, V6  ASSESSMENT AND PLAN:    Essential hypertension Hypotensive recently- continue current Rx  NSTEMI (non-ST elevated myocardial infarction) [facility]) Admitted September 06, 2018 with non-STEMI  S/P angioplasty with stent  Stent to prox SVG to LCX-PDA 09/26/12 NSTEMI- 09/06/18- SVG-PDA overlapping DES. Residual SVG-RI disease- medical Rx  Hx of CABG CABG 1997  Chronic combined systolic and diastolic CHF (congestive heart failure) (HCC) EF 25-30%, grade 2 DD  Dementia ([place]) Placed in SNF after NSTEMI and PCI Nov 2019   PLAN  Same Rx- check BMP today. F/U 3-4 weeks  Kerin Ransom PA-C 11/14/2018 3:32 PM

## 2018-11-15 LAB — BASIC METABOLIC PANEL
BUN/Creatinine Ratio: 20 (ref 10–24)
BUN: 16 mg/dL (ref 10–36)
CO2: 23 mmol/L (ref 20–29)
Calcium: 9.2 mg/dL (ref 8.6–10.2)
Chloride: 97 mmol/L (ref 96–106)
Creatinine, Ser: 0.79 mg/dL (ref 0.76–1.27)
GFR calc Af Amer: 91 mL/min/{1.73_m2} (ref >59)
GFR calc non Af Amer: 79 mL/min/{1.73_m2} (ref >59)
Glucose: 144 mg/dL — ABNORMAL HIGH (ref 65–99)
Potassium: 4.6 mmol/L (ref 3.5–5.2)
Sodium: 140 mmol/L (ref 134–144)

## 2018-11-26 ENCOUNTER — Ambulatory Visit (INDEPENDENT_AMBULATORY_CARE_PROVIDER_SITE_OTHER): Payer: Medicare Other | Admitting: Cardiology

## 2018-11-26 ENCOUNTER — Encounter: Payer: Self-pay | Admitting: Cardiology

## 2018-11-26 VITALS — BP 127/73 | HR 90 | Ht 65.0 in | Wt 160.8 lb

## 2018-11-26 DIAGNOSIS — I5042 Chronic combined systolic (congestive) and diastolic (congestive) heart failure: Secondary | ICD-10-CM

## 2018-11-26 DIAGNOSIS — I255 Ischemic cardiomyopathy: Secondary | ICD-10-CM | POA: Diagnosis not present

## 2018-11-26 MED ORDER — FUROSEMIDE 40 MG PO TABS: ORAL_TABLET | ORAL | 6 refills | Status: AC

## 2018-11-26 NOTE — Patient Instructions (Signed)
Medication Instructions:  INCREASE Lasix to 40mg  on Monday, Wednesday, and Friday and take 20mg  on Tuesday, Thursday, Saturday and Sunday If you need a refill on your cardiac medications before your next appointment, please call your pharmacy.   Lab work: None  If you have labs (blood work) drawn today and your tests are completely normal, you will receive your results only by: Marland Kitchen MyChart Message (if you have MyChart) OR . A paper copy in the mail If you have any lab test that is abnormal or we need to change your treatment, we will call you to review the results.  Testing/Procedures: None   Follow-Up: At [facility], you and your health needs are our priority.  As part of our continuing mission to provide you with exceptional heart care, we have created designated Provider Care Teams.  These Care Teams include your primary Cardiologist (physician) and Advanced Practice Providers (APPs -  Physician Assistants and Nurse Practitioners) who all work together to provide you with the care you need, when you need it. . FOLLOW UP AS SCHEDULED WITH DR BERRY  Any Other Special Instructions Will Be Listed Below (If Applicable).

## 2018-11-26 NOTE — Progress Notes (Signed)
11/26/2018 [name].   [birth year]  [id number]  Primary Physician Colon Branch, MD Primary Cardiologist: Dr Gwenlyn Found  HPI:  Pleasant 85 year old male whohas been followed by Dr. Gwenlyn Found for many years. He had CABGin 1997. He has IDDM, HTN, and dementia.In December 2013 he had an SVG-DES PCI withDES. Myoview in August 2017 was low risk. Echo in May 2019 showed an EF of 25-30%.  He presented September 06, 2018 with a NSTEMI. Catheterization was done which revealed disease in the SVG to PDA. He also had disease in the SVG to the RI but this appeared to be somewhat of a small branch. He underwent intervention to the SVG to RCA with overlapping DES placement. The plan was to consider doing a staged intervention for the SVG to RI. Ultimately it was decided to hold off on this because of multiple comorbidities including dementia and chronic renal insufficiency. At discharge the patient was placed in [place].   He was seen in the office 09/22/18 in follow-up. His Lasix was reduced to 40 mg daily secondary to low B/P.  He then presented to the ED 10/09/18 with HCAP and CHF.  He was again discharged to [facility] 10/14/18.    He was seen in the ED 11/04/2018 with low B/P though he was basically asymptomatic.  His Coreg and Lisinopril were stopped, he was kept on Lasix 20 mg and Imdur 15 mg.  He was seen the office 11/14/2018 he denied any chest pain or SOB.  His wife brought in a log of his B/P at home.  It usually runs less than 90 systolic till around noon, then over 100 in the PM.  I did not change his medications. BMP done that day did not suggest he was dehydrated.   He is back today for early follow up.  His wife says she has noted some increased SOB going up stairs. His weight is up to 160 lbs from 155 lbs.  The pt denies SOB at rest and has no edema.      Current Outpatient Medications  Medication Sig Dispense Refill  . acetaminophen (TYLENOL) 500 MG tablet Take 1,000  mg by mouth every 6 (six) hours as needed (for pain or headaches).     Marland Kitchen albuterol (PROVENTIL HFA;VENTOLIN HFA) 108 (90 Base) MCG/ACT inhaler Inhale 2 puffs into the lungs every 6 (six) hours as needed for wheezing or shortness of breath. 54 g 3  . Alcohol Swabs (ALCOHOL PREP) 70 % PADS Reported on 04/30/2016    . aspirin EC 81 MG tablet Take 1 tablet (81 mg total) by mouth daily.    . Blood Glucose Monitoring Suppl (BLOOD GLUCOSE METER) kit Use as instructed 1 each 0  . clopidogrel (PLAVIX) 75 MG tablet TAKE 1 TABLET DAILY WITH   BREAKFAST. KEEP OFFICE     VISIT. (Patient taking differently: Take 75 mg by mouth daily. ) 90 tablet 2  . ezetimibe (ZETIA) 10 MG tablet TAKE 1 TABLET DAILY. KEEP  OFFICE VISIT (Patient taking differently: Take 10 mg by mouth daily. ) 90 tablet 2  . finasteride (PROSCAR) 5 MG tablet Take 1 tablet (5 mg total) by mouth at bedtime. Women who are, or may become pregnant should NOT handle crushed or broken tablets. 90 tablet 3  . Fluticasone-Salmeterol (ADVAIR) 100-50 MCG/DOSE AEPB Inhale 1 puff into the lungs 2 (two) times daily. 180 each 1  . furosemide (LASIX) 40 MG tablet TAKE 1 TABLET BY MOUTH DAILY (Patient  taking differently: Take 20 mg by mouth daily. ) 30 tablet 6  . glucose blood test strip Use to test blood sugars twice daily 100 each 5  . Grape Seed 100 MG CAPS Take 100 mg by mouth daily.     . Insulin Glargine (BASAGLAR KWIKPEN) 100 UNIT/ML SOPN Inject 0.15 mLs (15 Units total) into the skin at bedtime. 10 mL 0  . Insulin Pen Needle 32G X 4 MM MISC To use w/ Basaglar 100 each 12  . isosorbide mononitrate (IMDUR) 30 MG 24 hr tablet Take 0.5 tablets (15 mg total) by mouth daily. 45 tablet 3  . Lancet Devices (ADJUSTABLE LANCING DEVICE) MISC Reported on 04/30/2016    . loperamide (IMODIUM A-D) 2 MG tablet Take 2 mg by mouth 4 (four) times daily as needed for diarrhea or loose stools.    . metFORMIN (GLUCOPHAGE) 850 MG tablet Take 850 mg by mouth 2 (two) times daily  with a meal.    . nitroGLYCERIN (NITROSTAT) 0.4 MG SL tablet Place 1 tablet (0.4 mg total) under the tongue every 5 (five) minutes x 3 doses as needed for chest pain. 40 tablet 1  . rosuvastatin (CRESTOR) 10 MG tablet TAKE 1 TABLET DAILY. (Patient taking differently: Take 10 mg by mouth daily. ) 90 tablet 2  . tamsulosin (FLOMAX) 0.4 MG CAPS capsule Take 1 capsule (0.4 mg total) by mouth daily after supper. 30 capsule 3  . XIIDRA 5 % SOLN Place 1 drop into the left eye 2 (two) times daily.     No current facility-administered medications for this visit.     Allergies  Allergen Reactions  . Ativan [Lorazepam] Other (See Comments)    "Made him crazy"  . Colesevelam Other (See Comments)    Pancreatitis (??)  . Ezetimibe-Simvastatin Other (See Comments)    Vytorin caused pancreatitis    Past Medical History:  Diagnosis Date  . Asthma    pt denies this hx on 11/02/2013  . BPH (benign prostatic hyperplasia)   . CAD (coronary artery disease)    a. s/p CABG 1997. b. s/p DES to SVG-PDA 2013. c. NSTEMI 08/2018 s/p DES to SVG-PDA; residual disease managed medically.  . Chronic combined systolic and diastolic CHF (congestive heart failure) ([place]) 09/15/2012  . CKD (chronic kidney disease), stage II   . Colonic polyp   . Dementia ([place])   . Exertional shortness of breath   . GERD (gastroesophageal reflux disease)   . Hearing loss   . History of pancreatitis    pt denies this hx on 11/02/2013  . Hyperlipidemia   . Hypertension   . Intrinsic asthma 06/18/2007   Qualifier: Diagnosis of  By: Dance CMA ([place]), Kim    . Ischemic cardiomyopathy 09/16/2012   EF 25%- improved to 45% 3/14  . Mild dilation of ascending aorta (HCC)   . Myocardial infarction ([place]) 1997  . NSTEMI (non-ST elevated myocardial infarction) ([place]) 09/06/2018  . Obstructed, uropathy   . Pulmonary hypertension ([place])   . PVD (peripheral vascular disease) (HCC)    moderate bilat carotid disease  . Type II diabetes mellitus ([place])    . Vasovagal syncope     Social History   Socioeconomic History  . Marital status: Married    Spouse name: Not on file  . Number of children: 1  . Years of education: Not on file  . Highest education level: Not on file  Occupational History  . 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 Financial resource strain: Not on file  . Food insecurity:    Worry: Not on file    Inability: Not on file  . Transportation needs:    Medical: Not on file    Non-medical: Not on file  Tobacco Use  . Smoking status: Never Smoker  . Smokeless tobacco: Never Used  . Tobacco comment: 11/02/2013 "smoked cigars when I was 18 or so; didn't smoke many"  Substance and Sexual Activity  . Alcohol use: Yes    Comment: rare  . Drug use: No  . Sexual activity: Not Currently  Lifestyle  . Physical activity:    Days per week: Not on file    Minutes per session: Not on file  . Stress: Not on file  Relationships  . Social connections:    Talks on phone: Not on file    Gets together: Not on file    Attends religious service: Not on file    Active member of club or organization: Not on file    Attends meetings of clubs or organizations: Not on file    Relationship status: Not on file  . Intimate partner violence:    Fear of current or ex partner: Not on file    Emotionally abused: Not on file    Physically abused: Not on file    Forced sexual activity: Not on file  Other Topics Concern  . 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 Prostate cancer Brother        dx ~ 69 y/o  . Colon cancer Neg Hx      Review of Systems: General: negative for chills, fever, night sweats or weight changes.  Cardiovascular: negative for chest pain, dyspnea on exertion, edema,  orthopnea, palpitations, paroxysmal nocturnal dyspnea or shortness of breath Dermatological: negative for rash Respiratory: negative for cough or wheezing Urologic: negative for hematuria Abdominal: negative for nausea, vomiting, diarrhea, bright red blood per rectum, melena, or hematemesis Neurologic: negative for visual changes, syncope, or dizziness All other systems reviewed and are otherwise negative except as noted above.    There were no vitals taken for this visit.  General appearance: alert, cooperative and no distress Neck: no JVD Lungs: decreased Lt base, no rales Heart: regular rate and rhythm Extremities: no edema Skin: pale, warm and dry Neurologic: Grossly normal   ASSESSMENT AND PLAN:   Essential hypertension Hypotensive recently- continue current Rx  NSTEMI (non-ST elevated myocardial infarction) [facility]) Admitted September 06, 2018 with non-STEMI  S/P angioplasty with stent Stent to prox SVG to LCX-PDA 09/26/12 NSTEMI- 09/06/18- SVG-PDA overlapping DES. Residual SVG-RI disease- medical Rx  Hx of CABG CABG 1997  Chronic combined systolic and diastolic CHF (congestive heart failure) (HCC) EF 25-30%, grade 2 DD  Dementia ([place]) Placed in SNF after NSTEMI and PCI Nov 2019  PLAN  ncrease Lasix to 40 mg MWF-20 mg other days. Keep f/u in March with Dr Gwenlyn Found as scheduled.  They know to call if his weight gets to 165 lbs.   Kerin Ransom PA-C 11/26/2018 3:44 PM

## 2018-12-04 ENCOUNTER — Emergency Department (HOSPITAL_COMMUNITY): Payer: Medicare Other

## 2018-12-04 ENCOUNTER — Encounter (HOSPITAL_COMMUNITY): Payer: Self-pay | Admitting: Emergency Medicine

## 2018-12-04 ENCOUNTER — Encounter (HOSPITAL_COMMUNITY): Admission: EM | Disposition: E | Payer: Self-pay | Source: Home / Self Care | Attending: Internal Medicine

## 2018-12-04 ENCOUNTER — Inpatient Hospital Stay (HOSPITAL_COMMUNITY)
Admission: EM | Admit: 2018-12-04 | Discharge: 2018-12-14 | DRG: 951 | Disposition: E | Payer: Medicare Other | Attending: Internal Medicine | Admitting: Internal Medicine

## 2018-12-04 DIAGNOSIS — N400 Enlarged prostate without lower urinary tract symptoms: Secondary | ICD-10-CM | POA: Diagnosis present

## 2018-12-04 DIAGNOSIS — IMO0001 Reserved for inherently not codable concepts without codable children: Secondary | ICD-10-CM

## 2018-12-04 DIAGNOSIS — I469 Cardiac arrest, cause unspecified: Secondary | ICD-10-CM | POA: Diagnosis present

## 2018-12-04 DIAGNOSIS — K219 Gastro-esophageal reflux disease without esophagitis: Secondary | ICD-10-CM | POA: Diagnosis present

## 2018-12-04 DIAGNOSIS — D631 Anemia in chronic kidney disease: Secondary | ICD-10-CM | POA: Diagnosis present

## 2018-12-04 DIAGNOSIS — F0390 Unspecified dementia without behavioral disturbance: Secondary | ICD-10-CM | POA: Diagnosis present

## 2018-12-04 DIAGNOSIS — Z794 Long term (current) use of insulin: Secondary | ICD-10-CM

## 2018-12-04 DIAGNOSIS — Z951 Presence of aortocoronary bypass graft: Secondary | ICD-10-CM | POA: Diagnosis not present

## 2018-12-04 DIAGNOSIS — E1122 Type 2 diabetes mellitus with diabetic chronic kidney disease: Secondary | ICD-10-CM | POA: Diagnosis present

## 2018-12-04 DIAGNOSIS — H9190 Unspecified hearing loss, unspecified ear: Secondary | ICD-10-CM | POA: Diagnosis present

## 2018-12-04 DIAGNOSIS — E1151 Type 2 diabetes mellitus with diabetic peripheral angiopathy without gangrene: Secondary | ICD-10-CM | POA: Diagnosis present

## 2018-12-04 DIAGNOSIS — S270XXA Traumatic pneumothorax, initial encounter: Secondary | ICD-10-CM

## 2018-12-04 DIAGNOSIS — X58XXXA Exposure to other specified factors, initial encounter: Secondary | ICD-10-CM | POA: Diagnosis present

## 2018-12-04 DIAGNOSIS — Z7902 Long term (current) use of antithrombotics/antiplatelets: Secondary | ICD-10-CM

## 2018-12-04 DIAGNOSIS — N182 Chronic kidney disease, stage 2 (mild): Secondary | ICD-10-CM | POA: Diagnosis present

## 2018-12-04 DIAGNOSIS — I272 Pulmonary hypertension, unspecified: Secondary | ICD-10-CM | POA: Diagnosis present

## 2018-12-04 DIAGNOSIS — G9340 Encephalopathy, unspecified: Secondary | ICD-10-CM | POA: Diagnosis present

## 2018-12-04 DIAGNOSIS — D649 Anemia, unspecified: Secondary | ICD-10-CM

## 2018-12-04 DIAGNOSIS — Z515 Encounter for palliative care: Secondary | ICD-10-CM | POA: Diagnosis present

## 2018-12-04 DIAGNOSIS — R402432 Glasgow coma scale score 3-8, at arrival to emergency department: Secondary | ICD-10-CM | POA: Diagnosis present

## 2018-12-04 DIAGNOSIS — Z79899 Other long term (current) drug therapy: Secondary | ICD-10-CM

## 2018-12-04 DIAGNOSIS — J45909 Unspecified asthma, uncomplicated: Secondary | ICD-10-CM | POA: Diagnosis present

## 2018-12-04 DIAGNOSIS — S2243XA Multiple fractures of ribs, bilateral, initial encounter for closed fracture: Secondary | ICD-10-CM | POA: Diagnosis present

## 2018-12-04 DIAGNOSIS — Z87891 Personal history of nicotine dependence: Secondary | ICD-10-CM

## 2018-12-04 DIAGNOSIS — I130 Hypertensive heart and chronic kidney disease with heart failure and stage 1 through stage 4 chronic kidney disease, or unspecified chronic kidney disease: Secondary | ICD-10-CM | POA: Diagnosis present

## 2018-12-04 DIAGNOSIS — Z9049 Acquired absence of other specified parts of digestive tract: Secondary | ICD-10-CM

## 2018-12-04 DIAGNOSIS — I10 Essential (primary) hypertension: Secondary | ICD-10-CM | POA: Diagnosis present

## 2018-12-04 DIAGNOSIS — S2241XA Multiple fractures of ribs, right side, initial encounter for closed fracture: Secondary | ICD-10-CM

## 2018-12-04 DIAGNOSIS — I5042 Chronic combined systolic (congestive) and diastolic (congestive) heart failure: Secondary | ICD-10-CM | POA: Diagnosis present

## 2018-12-04 DIAGNOSIS — Z7951 Long term (current) use of inhaled steroids: Secondary | ICD-10-CM

## 2018-12-04 DIAGNOSIS — S272XXA Traumatic hemopneumothorax, initial encounter: Secondary | ICD-10-CM | POA: Diagnosis present

## 2018-12-04 DIAGNOSIS — I2510 Atherosclerotic heart disease of native coronary artery without angina pectoris: Secondary | ICD-10-CM | POA: Diagnosis present

## 2018-12-04 DIAGNOSIS — E785 Hyperlipidemia, unspecified: Secondary | ICD-10-CM | POA: Diagnosis present

## 2018-12-04 DIAGNOSIS — E118 Type 2 diabetes mellitus with unspecified complications: Secondary | ICD-10-CM

## 2018-12-04 DIAGNOSIS — I255 Ischemic cardiomyopathy: Secondary | ICD-10-CM | POA: Diagnosis present

## 2018-12-04 DIAGNOSIS — I252 Old myocardial infarction: Secondary | ICD-10-CM

## 2018-12-04 DIAGNOSIS — Z66 Do not resuscitate: Secondary | ICD-10-CM | POA: Diagnosis present

## 2018-12-04 DIAGNOSIS — Z955 Presence of coronary angioplasty implant and graft: Secondary | ICD-10-CM

## 2018-12-04 DIAGNOSIS — Z7982 Long term (current) use of aspirin: Secondary | ICD-10-CM

## 2018-12-04 LAB — COMPREHENSIVE METABOLIC PANEL
ALT: 31 U/L (ref 0–44)
AST: 35 U/L (ref 15–41)
Albumin: 3 g/dL — ABNORMAL LOW (ref 3.5–5.0)
Alkaline Phosphatase: 68 U/L (ref 38–126)
Anion gap: 14 (ref 5–15)
BUN: 19 mg/dL (ref 8–23)
CALCIUM: 8.3 mg/dL — AB (ref 8.9–10.3)
CO2: 18 mmol/L — ABNORMAL LOW (ref 22–32)
CREATININE: 0.93 mg/dL (ref 0.61–1.24)
Chloride: 102 mmol/L (ref 98–111)
GFR calc Af Amer: >60 mL/min (ref >60)
GFR calc non Af Amer: >60 mL/min (ref >60)
Glucose, Bld: 199 mg/dL — ABNORMAL HIGH (ref 70–99)
Potassium: 3.6 mmol/L (ref 3.5–5.1)
Sodium: 134 mmol/L — ABNORMAL LOW (ref 135–145)
Total Bilirubin: 0.7 mg/dL (ref 0.3–1.2)
Total Protein: 5.6 g/dL — ABNORMAL LOW (ref 6.5–8.1)

## 2018-12-04 LAB — POCT I-STAT 7, (LYTES, BLD GAS, ICA,H+H)
Acid-base deficit: 2 mmol/L (ref 0.0–2.0)
Bicarbonate: 25.2 mmol/L (ref 20.0–28.0)
Calcium, Ion: 1.19 mmol/L (ref 1.15–1.40)
HCT: 26 % — ABNORMAL LOW (ref 39.0–52.0)
HEMOGLOBIN: 8.8 g/dL — AB (ref 13.0–17.0)
O2 Saturation: 100 %
POTASSIUM: 3.3 mmol/L — AB (ref 3.5–5.1)
Patient temperature: 35
Sodium: 135 mmol/L (ref 135–145)
TCO2: 27 mmol/L (ref 22–32)
pCO2 arterial: 48.7 mmHg — ABNORMAL HIGH (ref 32.0–48.0)
pH, Arterial: 7.312 — ABNORMAL LOW (ref 7.350–7.450)
pO2, Arterial: 424 mmHg — ABNORMAL HIGH (ref 83.0–108.0)

## 2018-12-04 LAB — CBC WITH DIFFERENTIAL/PLATELET
Abs Immature Granulocytes: 0 10*3/uL (ref 0.00–0.07)
Basophils Absolute: 0 10*3/uL (ref 0.0–0.1)
Basophils Relative: 0 %
Eosinophils Absolute: 4.5 10*3/uL — ABNORMAL HIGH (ref 0.0–0.5)
Eosinophils Relative: 31 %
HEMATOCRIT: 28.7 % — AB (ref 39.0–52.0)
Hemoglobin: 9 g/dL — ABNORMAL LOW (ref 13.0–17.0)
Lymphocytes Relative: 0 %
Lymphs Abs: 0 10*3/uL — ABNORMAL LOW (ref 0.7–4.0)
MCH: 29.6 pg (ref 26.0–34.0)
MCHC: 31.4 g/dL (ref 30.0–36.0)
MCV: 94.4 fL (ref 80.0–100.0)
Monocytes Absolute: 0.4 10*3/uL (ref 0.1–1.0)
Monocytes Relative: 3 %
Neutro Abs: 9.6 10*3/uL — ABNORMAL HIGH (ref 1.7–7.7)
Neutrophils Relative %: 66 %
Platelets: 169 10*3/uL (ref 150–400)
RBC: 3.04 MIL/uL — ABNORMAL LOW (ref 4.22–5.81)
RDW: 14.9 % (ref 11.5–15.5)
WBC: 14.5 10*3/uL — ABNORMAL HIGH (ref 4.0–10.5)
nRBC: 0 % (ref 0.0–0.2)

## 2018-12-04 LAB — URINALYSIS, ROUTINE W REFLEX MICROSCOPIC
Bilirubin Urine: NEGATIVE
Glucose, UA: NEGATIVE mg/dL
KETONES UR: NEGATIVE mg/dL
Leukocytes,Ua: NEGATIVE
Nitrite: NEGATIVE
Protein, ur: 100 mg/dL — AB
Specific Gravity, Urine: 1.018 (ref 1.005–1.030)
pH: 5 (ref 5.0–8.0)

## 2018-12-04 LAB — BRAIN NATRIURETIC PEPTIDE: B Natriuretic Peptide: 605.3 pg/mL — ABNORMAL HIGH (ref 0.0–100.0)

## 2018-12-04 LAB — LIPID PANEL
Cholesterol: 84 mg/dL (ref 0–200)
HDL: 40 mg/dL — ABNORMAL LOW (ref >40)
LDL Cholesterol: 38 mg/dL (ref 0–99)
Total CHOL/HDL Ratio: 2.1 RATIO
Triglycerides: 31 mg/dL (ref <150)
VLDL: 6 mg/dL (ref 0–40)

## 2018-12-04 LAB — PATHOLOGIST SMEAR REVIEW

## 2018-12-04 LAB — APTT: aPTT: 31 seconds (ref 24–36)

## 2018-12-04 LAB — TROPONIN I: Troponin I: 0.05 ng/mL (ref <0.03)

## 2018-12-04 LAB — PROTIME-INR
INR: 1.41
Prothrombin Time: 17.1 seconds — ABNORMAL HIGH (ref 11.4–15.2)

## 2018-12-04 SURGERY — INVASIVE LAB ABORTED CASE

## 2018-12-04 MED ORDER — ASPIRIN 81 MG PO CHEW: 324.0000 mg | CHEWABLE_TABLET | Freq: Once | ORAL | Status: DC

## 2018-12-04 MED ORDER — FENTANYL BOLUS VIA INFUSION
50.0000 ug | INTRAVENOUS | Status: DC | PRN
  Administered 2018-12-04: 50 ug via INTRAVENOUS
  Filled 2018-12-04: qty 50

## 2018-12-04 MED ORDER — SODIUM CHLORIDE 0.9 % IV SOLN
12.5000 mg | Freq: Four times a day (QID) | INTRAVENOUS | Status: DC | PRN
  Filled 2018-12-04: qty 0.5

## 2018-12-04 MED ORDER — HALOPERIDOL LACTATE 5 MG/ML IJ SOLN: 0.5000 mg | INTRAMUSCULAR | Status: DC | PRN

## 2018-12-04 MED ORDER — FENTANYL 2500MCG IN NS 250ML (10MCG/ML) PREMIX INFUSION
0.0000 ug/h | INTRAVENOUS | Status: DC
  Administered 2018-12-04: 50 ug/h via INTRAVENOUS
  Filled 2018-12-04: qty 250

## 2018-12-04 MED ORDER — SCOPOLAMINE 1 MG/3DAYS TD PT72
1.0000 | MEDICATED_PATCH | TRANSDERMAL | Status: DC
  Administered 2018-12-04: 1.5 mg via TRANSDERMAL
  Filled 2018-12-04: qty 1

## 2018-12-04 MED ORDER — MORPHINE BOLUS VIA INFUSION
2.0000 mg | INTRAVENOUS | Status: DC | PRN
  Administered 2018-12-04 – 2018-12-05 (×4): 2 mg via INTRAVENOUS
  Filled 2018-12-04: qty 2

## 2018-12-04 MED ORDER — HALOPERIDOL LACTATE 2 MG/ML PO CONC
0.5000 mg | ORAL | Status: DC | PRN
  Filled 2018-12-04: qty 0.3

## 2018-12-04 MED ORDER — POLYVINYL ALCOHOL 1.4 % OP SOLN
1.0000 [drp] | OPHTHALMIC | Status: DC | PRN
  Filled 2018-12-04: qty 15

## 2018-12-04 MED ORDER — ONDANSETRON HCL 4 MG/2ML IJ SOLN: 4.0000 mg | Freq: Four times a day (QID) | INTRAMUSCULAR | Status: DC | PRN

## 2018-12-04 MED ORDER — HEPARIN (PORCINE) IN NACL 1000-0.9 UT/500ML-% IV SOLN
INTRAVENOUS | Status: AC
  Filled 2018-12-04: qty 1500

## 2018-12-04 MED ORDER — FENTANYL CITRATE (PF) 100 MCG/2ML IJ SOLN
INTRAMUSCULAR | Status: AC
  Filled 2018-12-04: qty 2

## 2018-12-04 MED ORDER — SODIUM CHLORIDE 0.9 % IV SOLN: INTRAVENOUS | Status: DC

## 2018-12-04 MED ORDER — ASPIRIN 300 MG RE SUPP
300.0000 mg | Freq: Once | RECTAL | Status: AC
  Administered 2018-12-04: 300 mg via RECTAL
  Filled 2018-12-04: qty 1

## 2018-12-04 MED ORDER — FENTANYL BOLUS VIA INFUSION: 50.0000 ug | INTRAVENOUS | Status: DC | PRN

## 2018-12-04 MED ORDER — NOREPINEPHRINE 4 MG/250ML-% IV SOLN
INTRAVENOUS | Status: AC
  Administered 2018-12-04: 07:00:00
  Filled 2018-12-04: qty 250

## 2018-12-04 MED ORDER — ONDANSETRON 4 MG PO TBDP: 4.0000 mg | ORAL_TABLET | Freq: Four times a day (QID) | ORAL | Status: DC | PRN

## 2018-12-04 MED ORDER — POLYVINYL ALCOHOL 1.4 % OP SOLN
1.0000 [drp] | Freq: Four times a day (QID) | OPHTHALMIC | Status: DC | PRN
  Filled 2018-12-04: qty 15

## 2018-12-04 MED ORDER — HALOPERIDOL 1 MG PO TABS: 0.5000 mg | ORAL_TABLET | ORAL | Status: DC | PRN

## 2018-12-04 MED ORDER — NOREPINEPHRINE 4 MG/250ML-% IV SOLN
0.0000 ug/min | INTRAVENOUS | Status: DC
  Administered 2018-12-04: 5 ug/min via INTRAVENOUS

## 2018-12-04 MED ORDER — GLYCOPYRROLATE 0.2 MG/ML IJ SOLN
0.4000 mg | Freq: Four times a day (QID) | INTRAMUSCULAR | Status: DC
  Administered 2018-12-04 – 2018-12-05 (×3): 0.4 mg via INTRAVENOUS
  Filled 2018-12-04 (×3): qty 2

## 2018-12-04 MED ORDER — LORAZEPAM 2 MG/ML IJ SOLN
1.0000 mg | INTRAMUSCULAR | Status: DC | PRN
  Administered 2018-12-04: 1 mg via INTRAVENOUS
  Filled 2018-12-04: qty 1

## 2018-12-04 MED ORDER — MIDAZOLAM HCL 2 MG/2ML IJ SOLN
INTRAMUSCULAR | Status: AC
  Filled 2018-12-04: qty 2

## 2018-12-04 MED ORDER — ACETAMINOPHEN 325 MG PO TABS: 650.0000 mg | ORAL_TABLET | Freq: Four times a day (QID) | ORAL | Status: DC | PRN

## 2018-12-04 MED ORDER — LIDOCAINE HCL (PF) 1 % IJ SOLN
INTRAMUSCULAR | Status: AC
  Filled 2018-12-04: qty 30

## 2018-12-04 MED ORDER — MORPHINE 100MG IN NS 100ML (1MG/ML) PREMIX INFUSION
2.0000 mg/h | INTRAVENOUS | Status: DC
  Administered 2018-12-04: 2 mg/h via INTRAVENOUS
  Administered 2018-12-04: 3 mg/h via INTRAVENOUS
  Filled 2018-12-04: qty 100

## 2018-12-04 MED ORDER — SODIUM CHLORIDE 0.9 % IV BOLUS
1000.0000 mL | Freq: Once | INTRAVENOUS | Status: AC
  Administered 2018-12-04: 1000 mL via INTRAVENOUS

## 2018-12-04 MED ORDER — ACETAMINOPHEN 650 MG RE SUPP: 650.0000 mg | Freq: Four times a day (QID) | RECTAL | Status: DC | PRN

## 2018-12-04 SURGICAL SUPPLY — 6 items
KIT ENCORE 26 ADVANTAGE (KITS) IMPLANT
KIT HEART LEFT (KITS) ×1 IMPLANT
PACK CARDIAC CATHETERIZATION (CUSTOM PROCEDURE TRAY) ×1 IMPLANT
SYR MEDRAD MARK 7 150ML (SYRINGE) ×1 IMPLANT
TRANSDUCER W/STOPCOCK (MISCELLANEOUS) ×1 IMPLANT
TUBING CIL FLEX 10 FLL-RA (TUBING) ×1 IMPLANT

## 2018-12-04 NOTE — ED Notes (Signed)
Extubation completed by RT with this RN at bedside. Pt on room air following. Will adjust as needed. Pt remains on Fentanyl drip. Family at bedside. Comfort care being provided. Familys chaplin at bedside.

## 2018-12-04 NOTE — ED Notes (Signed)
Report given to Montz, RN,

## 2018-12-04 NOTE — ED Notes (Signed)
Wife and grasndson at bedside requesting that ET tube be removed. MD and RT aware. Levophed stopped.

## 2018-12-04 NOTE — Consult Note (Addendum)
Cardiology Consultation Note    Patient ID: [name]., MRN: [id number], DOB/AGE: [id number] 83 y.o. Admit date: 11/19/2018   Date of Consult: 11/28/2018 Primary Physician: Colon Branch, MD Primary Cardiologist: Dr. Gwenlyn Found  Reason for Consultation:Post arrest Requesting MD: Dr. Roxanne Mins  HPI: [name]. is a 85 y.o. male with a history of CAD s/p CABG, NSTEMI in 08/2018 s/p DES to SVG-PDA, iCMP (EF 35-40%), who presents following a cardiac arrest.  Just before 5AM, pt's wife noted that he was not breathing;  EMS was immediately called.  Upon arrival, the pt was in PEA arrest and CPR was started after about 15 minutes of downtime.  ROSC was achieved after 3 rounds of chest compressions and 1 dose of epinephrine.  He was intubated in the field.  Initial 12 lead ECG immediately post arrest showed concern for possible anterior STE, though this resolved on subsequent ECGs.  There was no purposeful movement noted post arrest.  He arrived to the [facility] ED with a BP of 90/70 without any vasoactive agents.  Repeat ECG showed no STE.  Past Medical History:  Diagnosis Date  . Asthma    pt denies this hx on 11/02/2013  . BPH (benign prostatic hyperplasia)   . CAD (coronary artery disease)    a. s/p CABG 1997. b. s/p DES to SVG-PDA 2013. c. NSTEMI 08/2018 s/p DES to SVG-PDA; residual disease managed medically.  . Chronic combined systolic and diastolic CHF (congestive heart failure) ([place]) 09/15/2012  . CKD (chronic kidney disease), stage II   . Colonic polyp   . Dementia ([place])   . Exertional shortness of breath   . GERD (gastroesophageal reflux disease)   . Hearing loss   . History of pancreatitis    pt denies this hx on 11/02/2013  . Hyperlipidemia   . Hypertension   . Intrinsic asthma 06/18/2007   Qualifier: Diagnosis of  By: Dance CMA ([place]), Kim    . Ischemic cardiomyopathy 09/16/2012   EF 25%- improved to 45% 3/14  . Mild dilation of ascending aorta (HCC)   . Myocardial infarction ([place])  1997  . NSTEMI (non-ST elevated myocardial infarction) ([place]) 09/06/2018  . Obstructed, uropathy   . Pulmonary hypertension ([place])   . PVD (peripheral vascular disease) (HCC)    moderate bilat carotid disease  . Type II diabetes mellitus ([place])   . Vasovagal syncope       Surgical History:  Past Surgical History:  Procedure Laterality Date  . CATARACT EXTRACTION W/ INTRAOCULAR LENS  IMPLANT, BILATERAL Bilateral ~ 1970  . CHOLECYSTECTOMY  2000's  . CORONARY ANGIOPLASTY WITH STENT PLACEMENT  Dec 2013   DES to SVG-CFX/PDA  . CORONARY ARTERY BYPASS GRAFT  1997   "CABG X 5"  . CORONARY STENT INTERVENTION N/A 09/08/2018   Procedure: CORONARY STENT INTERVENTION;  Surgeon: Lorretta Harp, MD;  Location: [facility] CV LAB;  Service: Cardiovascular;  Laterality: N/A;  . INGUINAL HERNIA REPAIR Bilateral ~ 1950  . LEFT HEART CATH AND CORS/GRAFTS ANGIOGRAPHY N/A 09/08/2018   Procedure: LEFT HEART CATH AND CORS/GRAFTS ANGIOGRAPHY;  Surgeon: Lorretta Harp, MD;  Location: [facility] CV LAB;  Service: Cardiovascular;  Laterality: N/A;  . LEFT HEART CATHETERIZATION WITH CORONARY/GRAFT ANGIOGRAM N/A 09/16/2012   Procedure: LEFT HEART CATHETERIZATION WITH Beatrix Fetters;  Surgeon: Lorretta Harp, MD;  Location: [facility] CATH LAB;  Service: Cardiovascular;  Laterality: N/A;  . NASAL SEPTUM SURGERY  2007  . RETINAL DETACHMENT SURGERY Left ?  1995  . SHOULDER OPEN ROTATOR CUFF REPAIR Right      Home Meds: Prior to Admission medications   Medication Sig Start Date End Date Taking? Authorizing Provider  acetaminophen (TYLENOL) 500 MG tablet Take 1,000 mg by mouth every 6 (six) hours as needed (for pain or headaches).     [provider]  albuterol (PROVENTIL HFA;VENTOLIN HFA) 108 (90 Base) MCG/ACT inhaler Inhale 2 puffs into the lungs every 6 (six) hours as needed for wheezing or shortness of breath. 08/29/18   Colon Branch, MD  Alcohol Swabs (ALCOHOL PREP) 70 % PADS Reported on 04/30/2016  02/23/14   [provider]  aspirin EC 81 MG tablet Take 1 tablet (81 mg total) by mouth daily. 11/14/18   Erlene Quan, PA-C  Blood Glucose Monitoring Suppl (BLOOD GLUCOSE METER) kit Use as instructed 04/08/13   Norins, Heinz Knuckles, MD  clopidogrel (PLAVIX) 75 MG tablet TAKE 1 TABLET DAILY WITH   BREAKFAST. KEEP OFFICE     VISIT. Patient taking differently: Take 75 mg by mouth daily.  06/30/18   Lorretta Harp, MD  ezetimibe (ZETIA) 10 MG tablet TAKE 1 TABLET DAILY. KEEP  OFFICE VISIT Patient taking differently: Take 10 mg by mouth daily.  06/30/18   Lorretta Harp, MD  finasteride (PROSCAR) 5 MG tablet Take 1 tablet (5 mg total) by mouth at bedtime. Women who are, or may become pregnant should NOT handle crushed or broken tablets. 11/06/18   Colon Branch, MD  Fluticasone-Salmeterol (ADVAIR) 100-50 MCG/DOSE AEPB Inhale 1 puff into the lungs 2 (two) times daily. 08/09/17   Colon Branch, MD  furosemide (LASIX) 40 MG tablet Take 1 (50m) tablet Mon, Wen, Fri and 0.5 (237m tablet on other days Tue,Thurs, Sat, Sun 11/26/18   KiKerin Ransom, PA-C  glucose blood test strip Use to test blood sugars twice daily 02/01/14   Norins, MiHeinz KnucklesMD  Grape Seed 100 MG CAPS Take 100 mg by mouth daily.     [provider]  Insulin Glargine (BASAGLAR KWIKPEN) 100 UNIT/ML SOPN Inject 0.15 mLs (15 Units total) into the skin at bedtime. 10/14/18   Dhungel, NiFlonnie OvermanMD  Insulin Pen Needle 32G X 4 MM MISC To use w/ Basaglar 11/29/15   PaColon BranchMD  isosorbide mononitrate (IMDUR) 30 MG 24 hr tablet Take 0.5 tablets (15 mg total) by mouth daily. 11/06/18   PaColon BranchMD  Lancet Devices (ADJUSTABLE LANCING DEVICE) MISC Reported on 04/30/2016 02/23/14   [provider]  loperamide (IMODIUM A-D) 2 MG tablet Take 2 mg by mouth 4 (four) times daily as needed for diarrhea or loose stools.    [provider]  metFORMIN (GLUCOPHAGE) 850 MG tablet Take 850 mg by mouth 2 (two) times daily with a  meal.    [provider]  nitroGLYCERIN (NITROSTAT) 0.4 MG SL tablet Place 1 tablet (0.4 mg total) under the tongue every 5 (five) minutes x 3 doses as needed for chest pain. 02/04/18   PaColon BranchMD  rosuvastatin (CRESTOR) 10 MG tablet TAKE 1 TABLET DAILY. Patient taking differently: Take 10 mg by mouth daily.  06/30/18   BeLorretta HarpMD  tamsulosin (FLOMAX) 0.4 MG CAPS capsule Take 1 capsule (0.4 mg total) by mouth daily after supper. 09/24/18   PaColon BranchMD  XIIDRA 5 % SOLN Place 1 drop into the left eye 2 (two) times daily. 08/27/18   [provider]  Inpatient Medications:  . midazolam       . sodium chloride    . norepinephrine    . sodium chloride 1,000 mL (12/08/2018 0622)    Allergies:  Allergies  Allergen Reactions  . Ativan [Lorazepam] Other (See Comments)    "Made him crazy"  . Colesevelam Other (See Comments)    Pancreatitis (??)  . Ezetimibe-Simvastatin Other (See Comments)    Vytorin caused pancreatitis    Social History   Socioeconomic History  . Marital status: Married    Spouse name: Not on file  . Number of children: 1  . Years of education: Not on file  . Highest education level: Not on file  Occupational History  . 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 Financial resource strain: Not on file  . Food insecurity:    Worry: Not on file    Inability: Not on file  . Transportation needs:    Medical: Not on file    Non-medical: Not on file  Tobacco Use  . Smoking status: Never Smoker  . Smokeless tobacco: Never Used  . Tobacco comment: 11/02/2013 "smoked cigars when I was 18 or so; didn't smoke many"  Substance and Sexual Activity  . Alcohol use: Yes    Comment: rare  . Drug use: No  . Sexual activity: Not Currently  Lifestyle  . Physical activity:    Days per week: Not on file    Minutes per session: Not on file  . Stress: Not on file  Relationships  . Social connections:    Talks on  phone: Not on file    Gets together: Not on file    Attends religious service: Not on file    Active member of club or organization: Not on file    Attends meetings of clubs or organizations: Not on file    Relationship status: Not on file  . Intimate partner violence:    Fear of current or ex partner: Not on file    Emotionally abused: Not on file    Physically abused: Not on file    Forced sexual activity: Not on file  Other Topics Concern  . 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 Prostate cancer Brother        dx ~ 62 y/o  . Colon cancer Neg Hx      Review of Systems All other systems reviewed and are otherwise negative except as noted above.  Labs: No results for input(s): CKTOTAL, CKMB, TROPONINI in the last 72 hours. Lab Results  Component Value Date   WBC 7.9 11/04/2018   HGB 10.2 (L) 11/04/2018   HCT 31.3 (L) 11/04/2018   MCV 91.3 11/04/2018   PLT 221 11/04/2018   No results for input(s): NA, K, CL, CO2, BUN, CREATININE, CALCIUM, PROT, BILITOT, ALKPHOS, ALT, AST, GLUCOSE in the last 168 hours.  Invalid input(s): LABALBU Lab Results  Component Value Date   CHOL 110 07/08/2018   HDL 47.00 07/08/2018   LDLCALC 49 07/08/2018   TRIG 67.0 07/08/2018   No results found for: DDIMER  Radiology/Studies:  No results found.  Wt Readings from Last 3 Encounters:  11/26/18 72.9 kg  11/14/18 71.2 kg  11/12/18 69.4 kg    EKG (from [facility] ED): NSR with PVC, poor RWP (unchanged relative to prior), no acute ST-TW changes  Physical Exam: Blood pressure (!) 101/59, pulse 73, resp. rate (!) 6, SpO2 100 %. There is no height or weight on file to calculate BMI. General: Inutbated Head: Normocephalic, atraumatic, sclera non-icteric, no xanthomas, nares are without  discharge.  Neck: Negative for carotid bruits. JVD not elevated. Lungs: Clear bilaterally to auscultation without wheezes, rales, or rhonchi. Breathing is unlabored. Heart: RRR with S1 S2. No murmurs, rubs, or gallops appreciated. Abdomen: Soft, non-tender, non-distended with normoactive bowel sounds. No hepatomegaly. No rebound/guarding. No obvious abdominal masses. Msk:  Strength and tone appear normal for age. Extremities: No clubbing or cyanosis. No edema.  Distal pedal pulses are 1+ and equal bilaterally. Neuro: Not responding to voice, not making any purposeful movements     Assessment and Plan  77M with iCMP who presents following PEA arrest, not currently following commands.  Initially, there was concern for STE anteriorly, but these changes resolved completely with more time after ROSC.  Without clear evidence of an occluded artery and with prolonged down time (over 20 minutes in total, 15 of which were without any CPR), would defer on emergent catheterization.  Would favor initial stabilization in ICU and further goals of care discussion with family.    Patient seen and examined with Dr. Ellyn Hack, who agrees with above plan.  Signed, Doylene Canning, MD 12/02/2018, 6:36 AM   INTERVENTIONAL ATTENDING ATTESTATION: I personally saw and evaluated the patient in the emergency room along with Dr.Jaidip Chakravartti, patient presented with likely PEA arrest after 15 minutes of not breathing.  This was preceded by at least a half an hour of agonal breathing and an acute respiratory distress.  Unfortunately upon EMS arrival they started CPR, during which the grandson went to retrieve his living will/DNR order.  By the time this was found, they did have ROSC.  He received 1 round of epinephrine with a couple minutes CPR and intubation.  Initial blood pressures were in the 170s, however upon arrival here to [facility] pressures are now more in the 23R systolic. He is intubated, minimally  sedated.   EKG initially post CPR showed either a conduction delay with what appears to be possible anterior elevations, no reciprocal changes.  Upon arrival to [facility], EKG has normalized out with no evidence of ST elevations.  I did have a discussion to the grandson's telephone with the patient's wife who was clear with her thoughts and wishes.  She did not feel like that he would be interested in going forward with aggressive measures at this time such as cardiac catheterization.  We discussed plan of care which would be to have him admitted to the ICU and managed medically for now.  Dr. Roxanne Mins from the emergency room will talk to him about the fact that there does appear to be a small pneumothorax.  We will forego invasive measures pending reevaluation and assessment for return of meaningful neurologic responsiveness. I suspect that his chance of favorable outcome are very poor and that would likely would not consider cardiac catheterization unless he has a good neurologic recovery.  He did have an 80% SVG-RI lesion that was not treated during last cath, but has known combined systolic and diastolic heart failure.  Cardiology Interventional team will follow while in CCU.   Glenetta Hew, M.D., M.S. Interventional Cardiologist   Pager # [phone] Phone #  [address] [place], [place] [address]

## 2018-12-04 NOTE — ED Notes (Signed)
Mouth care performed.

## 2018-12-04 NOTE — Procedures (Signed)
Extubation Procedure Note  Patient Details:   Name: [name]. DOB: [birth year] MRN: [id number]   Airway Documentation:  Airway 7 mm (Active)  Secured at (cm) 26 cm 2018-12-17  7:20 AM  Measured From Lips 12/17/2018  7:20 AM  Secured Location Center 12/17/18  7:20 AM  Secured By [employer] 12-17-18  7:20 AM  Tube Holder Repositioned Yes 12/17/18  7:20 AM  Cuff Pressure (cm H2O) 26 cm H2O 2018/12/17  7:20 AM  Site Condition Dry 12/17/18  7:20 AM   Vent end date: (not recorded) Vent end time: (not recorded)   Evaluation  O2 sats: stable throughout Complications: No apparent complications Patient did tolerate procedure well. Bilateral Breath Sounds: Diminished   No   Pt was extubated and placed onto comfort care per order of MD.   Merlene Laughter December 17, 2018, 10:47 AM

## 2018-12-04 NOTE — Progress Notes (Signed)
This was a follow up referral from on call night chaplain to continue support to family at bedside. Visited with patient and family.  Family is withdrawing life support.  Family's Renato Gails is with them providing spiritual care.  Chaplain availiabe as needed.  Venida Jarvis, [place], [facility], Pager [phone]

## 2018-12-04 NOTE — ED Provider Notes (Signed)
[facility] EMERGENCY DEPARTMENT Provider Note   CSN: [id number] Arrival date & time:       History   Chief Complaint Chief Complaint  Patient presents with  . Code STEMI    HPI [name]. is a 85 y.o. male.  The history is provided by the EMS personnel. The history is limited by the condition of the patient (Unresponsive, intubated).  He has history of coronary artery disease, combined systolic and diastolic heart failure, hypertension, diabetes, hyperlipidemia, pulmonary hypertension and was brought to the ED as a code STEMI.  Apparently had been complaining of some increased shortness of breath which got much worse at [facility]  EMS was called and noted that he was apneic and pulseless with initial monitor showing pulseless electrical activity.  CPR was initiated, he received 1 dose of epinephrine, and had return of spontaneous circulation with approximately 10 minutes of CPR.  Initial blood pressure was 616 systolic, but has been falling in route.  He has maintained pulses throughout.  He was intubated.  Past Medical History:  Diagnosis Date  . Asthma    pt denies this hx on 11/02/2013  . BPH (benign prostatic hyperplasia)   . CAD (coronary artery disease)    a. s/p CABG 1997. b. s/p DES to SVG-PDA 2013. c. NSTEMI 08/2018 s/p DES to SVG-PDA; residual disease managed medically.  . Chronic combined systolic and diastolic CHF (congestive heart failure) ([place]) 09/15/2012  . CKD (chronic kidney disease), stage II   . Colonic polyp   . Dementia ([place])   . Exertional shortness of breath   . GERD (gastroesophageal reflux disease)   . Hearing loss   . History of pancreatitis    pt denies this hx on 11/02/2013  . Hyperlipidemia   . Hypertension   . Intrinsic asthma 06/18/2007   Qualifier: Diagnosis of  By: Dance CMA ([place]), Kim    . Ischemic cardiomyopathy 09/16/2012   EF 25%- improved to 45% 3/14  . Mild dilation of ascending aorta (HCC)   . Myocardial infarction  ([place]) 1997  . NSTEMI (non-ST elevated myocardial infarction) ([place]) 09/06/2018  . Obstructed, uropathy   . Pulmonary hypertension ([place])   . PVD (peripheral vascular disease) (HCC)    moderate bilat carotid disease  . Type II diabetes mellitus ([place])   . Vasovagal syncope     Patient Active Problem List   Diagnosis Date Noted  . Acute on chronic combined systolic and diastolic CHF (congestive heart failure) ([place]) 10/14/2018  . Lobar pneumonia, unspecified organism ([place]) 10/14/2018  . Hypokalemia 10/14/2018  . Acute respiratory failure with hypoxia ([place]) 10/09/2018  . Urinary retention 09/10/2018  . NSTEMI (non-ST elevated myocardial infarction) ([place]) 09/06/2018  . Dementia ([place]) 11/26/2016  . Essential hypertension 07/30/2016  . PCP NOTES >>>>>>>>> 11/28/2015  . Chronic combined systolic and diastolic CHF (congestive heart failure) ([place]) 07/08/2015  . Fatigue 06/01/2015  . Annual physical exam 08/04/2014  . Carotid artery disease ([place]) 12/25/2013  . Syncope 11/02/2013  . Left facial numbness 11/02/2013  . Sleep apnea- C-pap   . Ischemic cardiomyopathy 09/16/2012  . S/P angioplasty with stent  09/16/2012  . Insulin dependent diabetes mellitus with complications ([place]) 07/37/1062  . BENIGN POSITIONAL VERTIGO 03/09/2010  . 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 Dyslipidemia, goal LDL below 70 06/18/2007  . Hx of CABG 06/18/2007  . ALLERGIC RHINITIS 06/18/2007  . Asthma, chronic 06/18/2007  . BPH (benign prostatic hyperplasia) 06/18/2007  . PANCREATITIS, HX OF 06/18/2007  .  COLONIC POLYPS, HX OF 06/18/2007    Past Surgical History:  Procedure Laterality Date  . CATARACT EXTRACTION W/ INTRAOCULAR LENS  IMPLANT, BILATERAL Bilateral ~ 1970  . CHOLECYSTECTOMY  2000's  . CORONARY ANGIOPLASTY WITH STENT PLACEMENT  Dec 2013   DES to SVG-CFX/PDA  . CORONARY ARTERY BYPASS GRAFT  1997   "CABG X 5"  . CORONARY STENT INTERVENTION N/A 09/08/2018   Procedure: CORONARY STENT  INTERVENTION;  Surgeon: Lorretta Harp, MD;  Location: [facility] CV LAB;  Service: Cardiovascular;  Laterality: N/A;  . INGUINAL HERNIA REPAIR Bilateral ~ 1950  . LEFT HEART CATH AND CORS/GRAFTS ANGIOGRAPHY N/A 09/08/2018   Procedure: LEFT HEART CATH AND CORS/GRAFTS ANGIOGRAPHY;  Surgeon: Lorretta Harp, MD;  Location: [facility];  Service: Cardiovascular;  Laterality: N/A;  . LEFT HEART CATHETERIZATION WITH CORONARY/GRAFT ANGIOGRAM N/A 09/16/2012   Procedure: LEFT HEART CATHETERIZATION WITH Beatrix Fetters;  Surgeon: Lorretta Harp, MD;  Location: [facility] CATH LAB;  Service: Cardiovascular;  Laterality: N/A;  . NASAL SEPTUM SURGERY  2007  . RETINAL DETACHMENT SURGERY Left ?1995  . SHOULDER OPEN ROTATOR CUFF REPAIR Right         Home Medications    Prior to Admission medications   Medication Sig Start Date End Date Taking? Authorizing Provider  acetaminophen (TYLENOL) 500 MG tablet Take 1,000 mg by mouth every 6 (six) hours as needed (for pain or headaches).     [provider]  albuterol (PROVENTIL HFA;VENTOLIN HFA) 108 (90 Base) MCG/ACT inhaler Inhale 2 puffs into the lungs every 6 (six) hours as needed for wheezing or shortness of breath. 08/29/18   Colon Branch, MD  Alcohol Swabs (ALCOHOL PREP) 70 % PADS Reported on 04/30/2016 02/23/14   [provider]  aspirin EC 81 MG tablet Take 1 tablet (81 mg total) by mouth daily. 11/14/18   Erlene Quan, PA-C  Blood Glucose Monitoring Suppl (BLOOD GLUCOSE METER) kit Use as instructed 04/08/13   Norins, Heinz Knuckles, MD  clopidogrel (PLAVIX) 75 MG tablet TAKE 1 TABLET DAILY WITH   BREAKFAST. KEEP OFFICE     VISIT. Patient taking differently: Take 75 mg by mouth daily.  06/30/18   Lorretta Harp, MD  ezetimibe (ZETIA) 10 MG tablet TAKE 1 TABLET DAILY. KEEP  OFFICE VISIT Patient taking differently: Take 10 mg by mouth daily.  06/30/18   Lorretta Harp, MD  finasteride (PROSCAR) 5 MG tablet Take 1 tablet (5 mg  total) by mouth at bedtime. Women who are, or may become pregnant should NOT handle crushed or broken tablets. 11/06/18   Colon Branch, MD  Fluticasone-Salmeterol (ADVAIR) 100-50 MCG/DOSE AEPB Inhale 1 puff into the lungs 2 (two) times daily. 08/09/17   Colon Branch, MD  furosemide (LASIX) 40 MG tablet Take 1 (73m) tablet Mon, Wen, Fri and 0.5 (24m tablet on other days Tue,Thurs, Sat, Sun 11/26/18   KiKerin Ransom, PA-C  glucose blood test strip Use to test blood sugars twice daily 02/01/14   Norins, MiHeinz KnucklesMD  Grape Seed 100 MG CAPS Take 100 mg by mouth daily.     [provider]  Insulin Glargine (BASAGLAR KWIKPEN) 100 UNIT/ML SOPN Inject 0.15 mLs (15 Units total) into the skin at bedtime. 10/14/18   Dhungel, NiFlonnie OvermanMD  Insulin Pen Needle 32G X 4 MM MISC To use w/ Basaglar 11/29/15   PaColon BranchMD  isosorbide mononitrate (IMDUR) 30 MG 24 hr tablet Take 0.5 tablets (  15 mg total) by mouth daily. 11/06/18   Colon Branch, MD  Lancet Devices (ADJUSTABLE LANCING DEVICE) MISC Reported on 04/30/2016 02/23/14   [provider]  loperamide (IMODIUM A-D) 2 MG tablet Take 2 mg by mouth 4 (four) times daily as needed for diarrhea or loose stools.    [provider]  metFORMIN (GLUCOPHAGE) 850 MG tablet Take 850 mg by mouth 2 (two) times daily with a meal.    [provider]  nitroGLYCERIN (NITROSTAT) 0.4 MG SL tablet Place 1 tablet (0.4 mg total) under the tongue every 5 (five) minutes x 3 doses as needed for chest pain. 02/04/18   Colon Branch, MD  rosuvastatin (CRESTOR) 10 MG tablet TAKE 1 TABLET DAILY. Patient taking differently: Take 10 mg by mouth daily.  06/30/18   Lorretta Harp, MD  tamsulosin (FLOMAX) 0.4 MG CAPS capsule Take 1 capsule (0.4 mg total) by mouth daily after supper. 09/24/18   Colon Branch, MD  XIIDRA 5 % SOLN Place 1 drop into the left eye 2 (two) times daily. 08/27/18   [provider]    Family History Family History  Problem Relation Age  of Onset  . Prostate cancer Brother        dx ~ 10 y/o  . Colon cancer Neg Hx     Social History Social History   Tobacco Use  . Smoking status: Never Smoker  . Smokeless tobacco: Never Used  . Tobacco comment: 11/02/2013 "smoked cigars when I was 18 or so; didn't smoke many"  Substance Use Topics  . Alcohol use: Yes    Comment: rare  . Drug use: No     Allergies   Ativan [lorazepam]; Colesevelam; and Ezetimibe-simvastatin   Review of Systems Review of Systems  Unable to perform ROS: Patient unresponsive     Physical Exam Updated Vital Signs BP (!) 101/59   Pulse 73   Resp (!) 6   SpO2 100%   Physical Exam Vitals signs and nursing note reviewed.    85 year old male, intubated and unresponsive. Vital signs are significant for hypotension. Oxygen saturation is 100%, which is normal. Head is normocephalic and atraumatic.  Pupils 4 mm and sluggishly reactive. ET tube in place. Neck has no adenopathy or JVD. Lungs are clear without rales, wheezes, or rhonchi. Chest: Crepitus is present bilaterally. Heart has regular rate and rhythm without murmur.  Tones are somewhat distant Abdomen is soft, flat, without masses or hepatosplenomegaly and peristalsis is normoactive. Extremities have no cyanosis or edema, full range of motion is present.  Intraosseous line present left anterior tibia. Skin is warm and dry without rash. Neurologic: Unresponsive to painful stimuli, GCS = 3.  ED Treatments / Results  Labs (all labs ordered are listed, but only abnormal results are displayed) Labs Reviewed  CBC WITH DIFFERENTIAL/PLATELET - Abnormal; Notable for the following components:      Result Value   WBC 14.5 (*)    RBC 3.04 (*)    Hemoglobin 9.0 (*)    HCT 28.7 (*)    Neutro Abs 9.6 (*)    Lymphs Abs 0.0 (*)    Eosinophils Absolute 4.5 (*)    All other components within normal limits  PROTIME-INR - Abnormal; Notable for the following components:   Prothrombin Time 17.1  (*)    All other components within normal limits  BRAIN NATRIURETIC PEPTIDE - Abnormal; Notable for the following components:   B Natriuretic Peptide 605.3 (*)  All other components within normal limits  URINALYSIS, ROUTINE W REFLEX MICROSCOPIC - Abnormal; Notable for the following components:   APPearance HAZY (*)    Hgb urine dipstick SMALL (*)    Protein, ur 100 (*)    Bacteria, UA RARE (*)    All other components within normal limits  POCT I-STAT 7, (LYTES, BLD GAS, ICA,H+H) - Abnormal; Notable for the following components:   pH, Arterial 7.312 (*)    pCO2 arterial 48.7 (*)    pO2, Arterial 424.0 (*)    Potassium 3.3 (*)    HCT 26.0 (*)    Hemoglobin 8.8 (*)    All other components within normal limits  APTT  COMPREHENSIVE METABOLIC PANEL  TROPONIN I  LIPID PANEL  PATHOLOGIST SMEAR REVIEW    EKG EKG Interpretation  Date/Time:  Thursday December 04 2018 06:11:33 EST Ventricular Rate:  80 PR Interval:    QRS Duration: 106 QT Interval:  410 QTC Calculation: 473 R Axis:   -30 Text Interpretation:  Sinus rhythm Ventricular premature complex Borderline prolonged PR interval Left axis deviation Anteroseptal infarct, old Repol abnrm suggests ischemia, diffuse leads When compared with ECG of 10/10/2018, Sinus rhythm has replaced nonsustained ventricular tachycardia Confirmed by Delora Fuel ([id number]) on 11/17/2018 6:28:18 AM   Radiology Dg Chest Port 1 View  Result Date: 11/22/2018 CLINICAL DATA:  Intubation EXAM: PORTABLE CHEST 1 VIEW COMPARISON:  10/09/2018 FINDINGS: Endotracheal tube with tip 3 cm above the carina. Indistinct retrocardiac opacity with trace pleural fluid. Small right base pneumothorax and right chest wall emphysema. The visible air collection is less than 5%, although some may be accumulating anteriorly and not detectable. Cardiomegaly. CABG. Displaced rib fractures on both sides, on the left seen at the third and fourth ribs and on the right seen at the fourth  and fifth ribs. Critical Value/emergent results were called by telephone at the time of interpretation on 11/19/2018 at 6:37 am to Dr. Delora Fuel , who verbally acknowledged these results. IMPRESSION: 1. Bilateral rib fractures and small right base pneumothorax. 2. Small left pleural effusion and retrocardiac opacification that is also seen previously. 3. Endotracheal tube in expected position. Electronically Signed   By: Monte Fantasia M.D.   On: 11/24/2018 06:38    Procedures Procedures  CRITICAL CARE Performed by: Delora Fuel Total critical care time: 95 minutes Critical care time was exclusive of separately billable procedures and treating other patients. Critical care was necessary to treat or prevent imminent or life-threatening deterioration. Critical care was time spent personally by me on the following activities: development of treatment plan with patient and/or surrogate as well as nursing, discussions with consultants, evaluation of patient's response to treatment, examination of patient, obtaining history from patient or surrogate, ordering and performing treatments and interventions, ordering and review of laboratory studies, ordering and review of radiographic studies, pulse oximetry and re-evaluation of patient's condition.  Medications Ordered in ED Medications  0.9 %  sodium chloride infusion (has no administration in time range)  midazolam (VERSED) 2 MG/2ML injection (has no administration in time range)  norepinephrine (LEVOPHED) 24m in 2513mpremix infusion (5 mcg/min Intravenous New Bag/Given 11/16/2018 0640)  fentaNYL (SUBLIMAZE) 100 MCG/2ML injection (has no administration in time range)  sodium chloride 0.9 % bolus 1,000 mL (0 mLs Intravenous Stopped 11/20/2018 0705)  aspirin suppository 300 mg (300 mg Rectal Given 11/20/2018 0633)  norepinephrine (LEVOPHED) 4-5 MG/250ML-% infusion SOLN (  Stopped 11/18/2018 0641)     Initial Impression / Assessment and Plan /  ED Course  I have  reviewed the triage vital signs and the nursing notes.  Pertinent labs & imaging results that were available during my care of the patient were reviewed by me and considered in my medical decision making (see chart for details).  PEA arrest with return of spontaneous circulation, currently with encephalopathy which is probably from hypoxia.  Old records are reviewed, and he had stent placed on 09/08/2018 but still had significant myocardium at risk.  Hospital discharge summary 10/09/2018 stated DNR status.  Patient was seen with Dr. Ellyn Hack, from cardiology service.  Patient does not meet STEMI criteria.  Will need to discuss with family whether to proceed with repeated hypothermia.  6:53 AM Family is here and states that patient was DNR and they would not want to proceed with therapeutic hypothermia.  Wife is in route to discuss whether to maintain on ventilator.  In the meantime, chest x-ray shows small right pneumothorax.  Will hold off on chest tube until decision is made about whether to continue on the ventilator.  8:04 AM His wife has arrived and, after extensive discussion with her and with family members, decision is made to take him off of the ventilator.  They do understand that he may die without ventilator support.  They do wish to wait until one additional family member arrives to see him before removing him from the ventilator.  Norepinephrine drip is discontinued.  Case is signed out to [place].  If he starts breathing on his own, he will need to be admitted to the hospitalist service.  Final Clinical Impressions(s) / ED Diagnoses   Final diagnoses:  Cardiac arrest with pulseless electrical activity (HCC)  Normochromic normocytic anemia  Multiple fractures of ribs, bilateral, initial encounter for closed fracture  Traumatic fracture of ribs with pneumothorax, right, closed, initial encounter    ED Discharge Orders    None       Delora Fuel, MD [birth year] [phone]

## 2018-12-04 NOTE — ED Notes (Signed)
Pt appears comfortable- agonal breathing. Wife at bedside. No needs at this time.

## 2018-12-04 NOTE — ED Notes (Signed)
Report given to [employer], rn

## 2018-12-04 NOTE — Progress Notes (Signed)
Received pt from ED. Patient's family at the bedside. Patient is labored breathing. Morphine 2mg  bolus given and PRN Ativan. Ashley Akin, RN at bedside. Will continue to monitor.

## 2018-12-04 NOTE — ED Notes (Signed)
Dr. Lowella Bandy updated on family. Continuing to wait for son. Pt appears restless. Pain medications requested. MD aware that levo was decreased and will be stopped upon arrival of family.

## 2018-12-04 NOTE — Progress Notes (Signed)
Palliative Medicine RN Note: Consult order noted.   Patient seen in the ED and discussed at length with Dr Laqueta Linden, PMT Medical Director.   [name] had an OOH this morning and was brought to ED. Family arrived, and after discussion, decision was made to shift to comfort care, withdraw ventilatory support, and allow a natural death. I spoke with [name], who states that the goal is comfort & dignity, and that he wouldn't want to linger. PMT is consulted to assist with symptoms.   On my arrival, patient is having labored breathing with a RR of about 28-30. He has wet breathing. RN reports that his secretions have been increasing. He is on a fentanyl drip, currently at 104mcg/hour with range order for basal rate but no bolus doses.  Dr Phillips Odor rotated Ryan Biagioni to morphine drip w bolus doses, added Ativan and [name]. Plan to remove O2 via [place] once morphine is hanging. Patient will be transferred to 6N01 shortly.   Follow up at 1640: Patient's secretions are markedly decreased. Dyspnea is somewhat better. He is on the morphine infusion but has just been transferred off ED stretcher, so some discomfort is not surprising. RN Noreene Larsson will give bolus morphine and Ativan to help relieve suffering. Ryan's [place]'s gaze is fixed in the distance, and he is not blinking, which is concerning for a very short prognosis. I confirmed the presence of order for rewetting eye drops. Nasal cannula removed.  [name] and her daughter remain at bedside. I discussed my concern that I don't know that he will survive the night; they have already called family & friends.  Plan for PMT to follow up tomorrow to ensure continued comfort. PMT will be available by phone [phone]. Outside of these hours, please call pt's primary MD.  [name]. Yuki Brunsman, RN, BSN, [facility] Palliative Medicine Team 11/30/2018 6:23 PM Office [phone]

## 2018-12-04 NOTE — Progress Notes (Signed)
Pt given morphine bolus of 2 mg for labored breathing, family at bedside.  Called Dr. Jerral Ralph and obtained order for scop patch.

## 2018-12-04 NOTE — ED Notes (Signed)
IO removed from left tib.

## 2018-12-04 NOTE — ED Notes (Signed)
Family at bedside. Will withdraw care per dr Preston Fleeting once all family has arrived.

## 2018-12-04 NOTE — ED Notes (Signed)
Pt placed on 2L O2. Pt appears comfortable.

## 2018-12-04 NOTE — H&P (Signed)
History and Physical    [name]. AYT:[id number] DOB: 1926-02-28 DOA: 12/11/2018  PCP: Colon Branch, MD  Patient coming from: home   I have personally briefly reviewed patient's old medical records available.   Chief Complaint: Found unresponsive.  HPI: Ryan Demas. is a 85 y.o. male with medical history significant of coronary artery disease, combined systolic and diastolic heart failure, hypertension, diabetes on insulin, hyperlipidemia, pulmonary hypertension, sleep apnea who was brought to the emergency room with CPR in progress.  Patient apparently was complaining of some increased shortness of breath with his wife and was very dyspneic by early morning.  Wife called EMS.  On EMS arrival patient was apneic and pulseless.  They started CPR, 1 dose of epinephrine and had return of spontaneous circulation.  Patient received about 10 minutes of chest compression.  He was intubated in route and brought to ER.  Patient is currently obtunded and unable to give any history. ED Course: Patient arrived in the emergency room intubated and ventilated.  He did have pulse.  He was encephalopathic.  After some time, patient's family arrived and they stated that patient was DNR and they would not want to proceed with therapeutic hypothermia.  After extensive discussion with patient's wife and multiple family members, decision was made to take him off ventilator.  Levophed drip was discontinued.  After extubation, he remains on fentanyl infusion, family has wished to pursue end-of-life care.  Patient needs to be admitted to the hospital for palliative care.  He is less likely to survive, will start patient on comfort measures.  Review of Systems: Obtunded and encephalopathic.   Past Medical History:  Diagnosis Date  . Asthma    pt denies this hx on 11/02/2013  . BPH (benign prostatic hyperplasia)   . CAD (coronary artery disease)    a. s/p CABG 1997. b. s/p DES to SVG-PDA 2013. c. NSTEMI  08/2018 s/p DES to SVG-PDA; residual disease managed medically.  . Chronic combined systolic and diastolic CHF (congestive heart failure) ([place]) 09/15/2012  . CKD (chronic kidney disease), stage II   . Colonic polyp   . Dementia ([place])   . Exertional shortness of breath   . GERD (gastroesophageal reflux disease)   . Hearing loss   . History of pancreatitis    pt denies this hx on 11/02/2013  . Hyperlipidemia   . Hypertension   . Intrinsic asthma 06/18/2007   Qualifier: Diagnosis of  By: Dance CMA ([place]), Kim    . Ischemic cardiomyopathy 09/16/2012   EF 25%- improved to 45% 3/14  . Mild dilation of ascending aorta (HCC)   . Myocardial infarction ([place]) 1997  . NSTEMI (non-ST elevated myocardial infarction) ([place]) 09/06/2018  . Obstructed, uropathy   . Pulmonary hypertension ([place])   . PVD (peripheral vascular disease) (HCC)    moderate bilat carotid disease  . Type II diabetes mellitus ([place])   . Vasovagal syncope     Past Surgical History:  Procedure Laterality Date  . CATARACT EXTRACTION W/ INTRAOCULAR LENS  IMPLANT, BILATERAL Bilateral ~ 1970  . CHOLECYSTECTOMY  2000's  . CORONARY ANGIOPLASTY WITH STENT PLACEMENT  Dec 2013   DES to SVG-CFX/PDA  . CORONARY ARTERY BYPASS GRAFT  1997   "CABG X 5"  . CORONARY STENT INTERVENTION N/A 09/08/2018   Procedure: CORONARY STENT INTERVENTION;  Surgeon: Lorretta Harp, MD;  Location: [facility];  Service: Cardiovascular;  Laterality: N/A;  . INGUINAL HERNIA REPAIR Bilateral ~  1950  . LEFT HEART CATH AND CORS/GRAFTS ANGIOGRAPHY N/A 09/08/2018   Procedure: LEFT HEART CATH AND CORS/GRAFTS ANGIOGRAPHY;  Surgeon: Lorretta Harp, MD;  Location: [facility];  Service: Cardiovascular;  Laterality: N/A;  . LEFT HEART CATHETERIZATION WITH CORONARY/GRAFT ANGIOGRAM N/A 09/16/2012   Procedure: LEFT HEART CATHETERIZATION WITH Beatrix Fetters;  Surgeon: Lorretta Harp, MD;  [facility];  Service: Cardiovascular;   Laterality: N/A;  . NASAL SEPTUM SURGERY  2007  . RETINAL DETACHMENT SURGERY Left ?1995  . SHOULDER OPEN ROTATOR CUFF REPAIR Right      reports that he has never smoked. He has never used smokeless tobacco. He reports current alcohol use. He reports that he does not use drugs.  Allergies  Allergen Reactions  . Ativan [Lorazepam] Other (See Comments)    "Made him crazy"  . Colesevelam Other (See Comments)    Pancreatitis (??)  . Ezetimibe-Simvastatin Other (See Comments)    Vytorin caused pancreatitis    Family History  Problem Relation Age of Onset  . Prostate cancer Brother        dx ~ 34 y/o  . Colon cancer Neg Hx      Prior to Admission medications   Medication Sig Start Date End Date Taking? Authorizing Provider  acetaminophen (TYLENOL) 500 MG tablet Take 1,000 mg by mouth every 6 (six) hours as needed (for pain or headaches).     [provider]  albuterol (PROVENTIL HFA;VENTOLIN HFA) 108 (90 Base) MCG/ACT inhaler Inhale 2 puffs into the lungs every 6 (six) hours as needed for wheezing or shortness of breath. 08/29/18   Colon Branch, MD  Alcohol Swabs (ALCOHOL PREP) 70 % PADS Reported on 04/30/2016 02/23/14   [provider]  aspirin EC 81 MG tablet Take 1 tablet (81 mg total) by mouth daily. 11/14/18   Erlene Quan, PA-C  Blood Glucose Monitoring Suppl (BLOOD GLUCOSE METER) kit Use as instructed 04/08/13   Norins, Heinz Knuckles, MD  clopidogrel (PLAVIX) 75 MG tablet TAKE 1 TABLET DAILY WITH   BREAKFAST. KEEP OFFICE     VISIT. Patient taking differently: Take 75 mg by mouth daily.  06/30/18   Lorretta Harp, MD  ezetimibe (ZETIA) 10 MG tablet TAKE 1 TABLET DAILY. KEEP  OFFICE VISIT Patient taking differently: Take 10 mg by mouth daily.  06/30/18   Lorretta Harp, MD  finasteride (PROSCAR) 5 MG tablet Take 1 tablet (5 mg total) by mouth at bedtime. Women who are, or may become pregnant should NOT handle crushed or broken tablets. 11/06/18   Colon Branch, MD    Fluticasone-Salmeterol (ADVAIR) 100-50 MCG/DOSE AEPB Inhale 1 puff into the lungs 2 (two) times daily. 08/09/17   Colon Branch, MD  furosemide (LASIX) 40 MG tablet Take 1 (45m) tablet Mon, Wen, Fri and 0.5 (215m tablet on other days Tue,Thurs, Sat, Sun 11/26/18   KiKerin Ransom, PA-C  glucose blood test strip Use to test blood sugars twice daily 02/01/14   Norins, MiHeinz KnucklesMD  Grape Seed 100 MG CAPS Take 100 mg by mouth daily.     [provider]  Insulin Glargine (BASAGLAR KWIKPEN) 100 UNIT/ML SOPN Inject 0.15 mLs (15 Units total) into the skin at bedtime. 10/14/18   Dhungel, NiFlonnie OvermanMD  Insulin Pen Needle 32G X 4 MM MISC To use w/ Basaglar 11/29/15   PaColon BranchMD  isosorbide mononitrate (IMDUR) 30 MG 24 hr tablet Take 0.5 tablets (15 mg  total) by mouth daily. 11/06/18   Colon Branch, MD  Lancet Devices (ADJUSTABLE LANCING DEVICE) MISC Reported on 04/30/2016 02/23/14   [provider]  loperamide (IMODIUM A-D) 2 MG tablet Take 2 mg by mouth 4 (four) times daily as needed for diarrhea or loose stools.    [provider]  metFORMIN (GLUCOPHAGE) 850 MG tablet Take 850 mg by mouth 2 (two) times daily with a meal.    [provider]  nitroGLYCERIN (NITROSTAT) 0.4 MG SL tablet Place 1 tablet (0.4 mg total) under the tongue every 5 (five) minutes x 3 doses as needed for chest pain. 02/04/18   Colon Branch, MD  rosuvastatin (CRESTOR) 10 MG tablet TAKE 1 TABLET DAILY. Patient taking differently: Take 10 mg by mouth daily.  06/30/18   Lorretta Harp, MD  tamsulosin (FLOMAX) 0.4 MG CAPS capsule Take 1 capsule (0.4 mg total) by mouth daily after supper. 09/24/18   Colon Branch, MD  XIIDRA 5 % SOLN Place 1 drop into the left eye 2 (two) times daily. 08/27/18   [provider]    Physical Exam: Vitals:   12/07/2018 0915 11/20/2018 0930 11/22/2018 0945 11/22/2018 1030  BP: (!) 160/75 (!) 158/77 (!) 157/76 114/62  Pulse: 89 89 89 88  Resp: 13 (!) '8 14 18  ' Temp: (!) 95.7  F (35.4 C) (!) 96.1 F (35.6 C) (!) 96.3 F (35.7 C) (!) 97.3 F (36.3 C)  TempSrc:      SpO2: 100% 100% 100% 99%    Constitutional: NAD, calm, comfortable Vitals:   12/02/2018 0915 11/17/2018 0930 11/16/2018 0945 11/22/2018 1030  BP: (!) 160/75 (!) 158/77 (!) 157/76 114/62  Pulse: 89 89 89 88  Resp: 13 (!) '8 14 18  ' Temp: (!) 95.7 F (35.4 C) (!) 96.1 F (35.6 C) (!) 96.3 F (35.7 C) (!) 97.3 F (36.3 C)  TempSrc:      SpO2: 100% 100% 100% 99%   Eyes: Bilateral pinpoint pupil.  No drainage. ENMT: Mucous membranes are dry.  Posterior pharynx clear of any exudate or lesions.Normal dentition.  Neck: normal, supple, no masses, no thyromegaly Respiratory: Deep and sonorous breathing.  Patient is obtunded.  Conducted airway sounds.  Cardiovascular: Regular rate and rhythm, no murmurs / rubs / gallops. No extremity edema. 2+ pedal pulses. No carotid bruits.  Abdomen: no tenderness, no masses palpated. No hepatosplenomegaly. Bowel sounds positive.  Musculoskeletal: no clubbing / cyanosis. No joint deformity upper and lower extremities. Good ROM, no contractures. Normal muscle tone.  Skin: no rashes, lesions, ulcers. No induration Neurologic: Obtunded not following commands. Psychiatric: Obtunded and not following commands.    Labs on Admission: I have personally reviewed following labs and imaging studies  CBC: Recent Labs  Lab 11/23/2018 0615 11/23/2018 0643  WBC 14.5*  --   NEUTROABS 9.6*  --   HGB 9.0* 8.8*  HCT 28.7* 26.0*  MCV 94.4  --   PLT 169  --    Basic Metabolic Panel: Recent Labs  Lab 12/11/2018 0615 11/25/2018 0643  NA 134* 135  K 3.6 3.3*  CL 102  --   CO2 18*  --   GLUCOSE 199*  --   BUN 19  --   CREATININE 0.93  --   CALCIUM 8.3*  --    GFR: Estimated Creatinine Clearance: 45.9 mL/min (by C-G formula based on SCr of 0.93 mg/dL). Liver Function Tests: Recent Labs  Lab  0615  AST 35  ALT 31  ALKPHOS 68  BILITOT 0.7  PROT 5.6*  ALBUMIN 3.0*    No results for input(s): LIPASE, AMYLASE in the last 168 hours. No results for input(s): AMMONIA in the last 168 hours. Coagulation Profile: Recent Labs  Lab 12/06/2018 0615  INR 1.41   Cardiac Enzymes: Recent Labs  Lab 11/27/2018 0615  TROPONINI 0.05*   BNP (last 3 results) No results for input(s): PROBNP in the last 8760 hours. HbA1C: No results for input(s): HGBA1C in the last 72 hours. CBG: No results for input(s): GLUCAP in the last 168 hours. Lipid Profile: Recent Labs    11/16/2018 0615  CHOL 84  HDL 40*  LDLCALC 38  TRIG 31  CHOLHDL 2.1   Thyroid Function Tests: No results for input(s): TSH, T4TOTAL, FREET4, T3FREE, THYROIDAB in the last 72 hours. Anemia Panel: No results for input(s): VITAMINB12, FOLATE, FERRITIN, TIBC, IRON, RETICCTPCT in the last 72 hours. Urine analysis:    Component Value Date/Time   COLORURINE YELLOW 11/26/2018 0656   APPEARANCEUR HAZY (A) 11/17/2018 0656   LABSPEC 1.018 12/06/2018 0656   PHURINE 5.0 11/16/2018 0656   GLUCOSEU NEGATIVE 12/03/2018 0656   GLUCOSEU NEGATIVE 09/24/2018 1450   HGBUR SMALL (A) 11/27/2018 0656   BILIRUBINUR NEGATIVE 12/01/2018 0656   KETONESUR NEGATIVE 11/25/2018 0656   PROTEINUR 100 (A) 11/26/2018 0656   UROBILINOGEN 0.2 09/24/2018 1450   NITRITE NEGATIVE 12/11/2018 0656   LEUKOCYTESUR NEGATIVE 11/17/2018 0656    Radiological Exams on Admission: Dg Chest Port 1 View  Result Date: 12/06/2018 CLINICAL DATA:  Intubation EXAM: PORTABLE CHEST 1 VIEW COMPARISON:  10/09/2018 FINDINGS: Endotracheal tube with tip 3 cm above the carina. Indistinct retrocardiac opacity with trace pleural fluid. Small right base pneumothorax and right chest wall emphysema. The visible air collection is less than 5%, although some may be accumulating anteriorly and not detectable. Cardiomegaly. CABG. Displaced rib fractures on both sides, on the left seen at the third and fourth ribs and on the right seen at the fourth and fifth ribs.  Critical Value/emergent results were called by telephone at the time of interpretation on 11/26/2018 at 6:37 am to Dr. Delora Fuel , who verbally acknowledged these results. IMPRESSION: 1. Bilateral rib fractures and small right base pneumothorax. 2. Small left pleural effusion and retrocardiac opacification that is also seen previously. 3. Endotracheal tube in expected position. Electronically Signed   By: Monte Fantasia M.D.   On: 11/28/2018 06:38    EKG: Independently reviewed. Low voltage, sinus rhythm, some PVCs.  No ST elevation.  Assessment/Plan Principal Problem:   Admission for end of life care Active Problems:   Hx of CABG   BPH (benign prostatic hyperplasia)   Insulin dependent diabetes mellitus with complications (HCC)   Chronic combined systolic and diastolic CHF (congestive heart failure) ([place])   Essential hypertension   Cardiac arrest ([place])     1.  Admission for end-of-life care: Patient has living will and DNR orders that were activated in the ER. Family at bedside. We will need admission to palliative floor to manage his symptoms. Continue with IV fentanyl, Haldol, anxiety medications to help keep him comfortable. Foley catheter for comfort measures. No IV sticks, no IV lines. Aspiration precautions. Consult palliative care to help with management. Patient may not survive to transfer to hospice level of care, will continue to follow. RN can pronounce death.  2.  Cardiac arrest: PEA arrest, cause unknown.  3.  Chronic combined systolic and diastolic heart failure: Not treating  4.  Type 2 diabetes on insulin: Not treating  5.  Hypertension: Not treating    DVT prophylaxis: None.  End-of-life care. Code Status: Comfort care. Family Communication: Wife, son and grandson at bedside. Disposition Plan: Unknown at this time. Consults called: Palliative medicine. Admission status: Inpatient.   Barb Merino MD [facility] Pager [phone]  If  7PM-7AM, please contact night-coverage [email or web address] Password [email or web address]  12/03/2018, 11:35 AM

## 2018-12-04 NOTE — Progress Notes (Signed)
Chaplain responded to a code stemi. Pt wa non responsive. Chaplain practiced ministry of presence and encouragement. Chaplain escorted family back to the pr. One more family member is coming. Chaplain will refer chaplain to follow up   11/23/2018 0800  Clinical Encounter Type  Visited With Patient;Family;Health care provider  Visit Type Code  Referral From Care management  Spiritual Encounters  Spiritual Needs Prayer;Emotional

## 2018-12-04 NOTE — ED Triage Notes (Signed)
Post CPR, respiratory distress at 3am, got worse, went unresponsive.  EMS arrived, no pulse, started CPR 10-15 min, I epi given. Intubated on scene, given 200 fentanyl b/c was fighting the intubation.

## 2018-12-05 ENCOUNTER — Encounter: Payer: Self-pay | Admitting: Internal Medicine

## 2018-12-14 NOTE — Death Summary Note (Addendum)
Death Summary  [name]. QMG:[id number] DOB: [birth year] DOA: Dec 07, 2018  PCP: Wanda Plump, MD PCP/Office notified: none  Admit date: Dec 07, 2018 Date of Death: 12-08-18  Final Diagnoses:  Principal Problem:   Admission for end of life care Active Problems: Cardiac arrest Chronic combined systolic and diastolic heart failure Diabetes mellitus, type II Hypertension   History of present illness:  On 2018-12-07 by Dr. Berkley Harvey. is a 85 y.o. male with medical history significant of coronary artery disease, combined systolic and diastolic heart failure, hypertension, diabetes on insulin, hyperlipidemia, pulmonary hypertension, sleep apnea who was brought to the emergency room with CPR in progress.  Patient apparently was complaining of some increased shortness of breath with his wife and was very dyspneic by early morning.  Wife called EMS.  On EMS arrival patient was apneic and pulseless.  They started CPR, 1 dose of epinephrine and had return of spontaneous circulation.  Patient received about 10 minutes of chest compression.  He was intubated in route and brought to ER.  Patient is currently obtunded and unable to give any history.  Hospital Course:  Patient was brought to the emergency department while CPR was in progress.  Patient did have spontaneous return of circulation and received approximately 10 minutes of chest compressions.  He was intubated.  Patient's family stated that he was a DNR and did not wish to proceed with hypothermic protocol.  Discussion was had with the family and decision was made to take him off the ventilator, Levophed drip was also discontinued.  Patient was then placed on comfort care measures.  Patient passed at approximately 8 AM this morning.  Death certificate completed.  Other medical problems Chronic combined systolic and diastolic heart failure Type 2 diabetes mellitus Hypertension  Blood pressure 125/67, pulse (!) 108,  temperature (!) 102.9 F (39.4 C), temperature source Oral, resp. rate (!) 24, SpO2 (!) 75 %. Patient was seen and examined this morning, prior to passing. He appeared comfortable with apneic breath sounds.   Time: 20 minutes  Signed:  Edsel Petrin  [facility] December 08, 2018, 9:27 AM

## 2018-12-14 NOTE — Progress Notes (Signed)
Wasted 75 mL of Morphine drip with Shanon Brow, RN.

## 2018-12-14 NOTE — Progress Notes (Signed)
Patient expired 0800 with family at bedside. Emotional support provided to family. MD notified. Patient will be picked up by funeral home.

## 2018-12-14 NOTE — Progress Notes (Signed)
Nutrition Brief Note  Chart reviewed. Pt now transitioning to comfort care.  No further nutrition interventions warranted at this time.  Please re-consult as needed.   Makana Rostad A. Maheen Cwikla, RD, LDN, CDE Pager: [phone] After hours Pager: [phone]  

## 2018-12-14 DEATH — deceased

## 2018-12-17 ENCOUNTER — Other Ambulatory Visit: Payer: Self-pay | Admitting: Internal Medicine

## 2019-01-06 ENCOUNTER — Ambulatory Visit: Payer: Medicare Other | Admitting: Internal Medicine

## 2019-01-09 ENCOUNTER — Ambulatory Visit: Payer: Medicare Other | Admitting: Cardiovascular Disease

## 2019-08-06 IMAGING — CT CT HEAD W/O CM
4 series · 15 of 47 positions shown, 17 images · non-contrast
Comparison: 11/21/2014

CLINICAL DATA: Altered level of consciousness.

EXAM:
CT HEAD WITHOUT CONTRAST
TECHNIQUE: Contiguous axial images were obtained from the base of the skull
through the vertex without intravenous contrast.

[Series 3: head wo · axial · 0.44mm/px · z∈[-270,-150]mm · 7 of 34 slices shown, 9 images]
[im 5/34  brain]
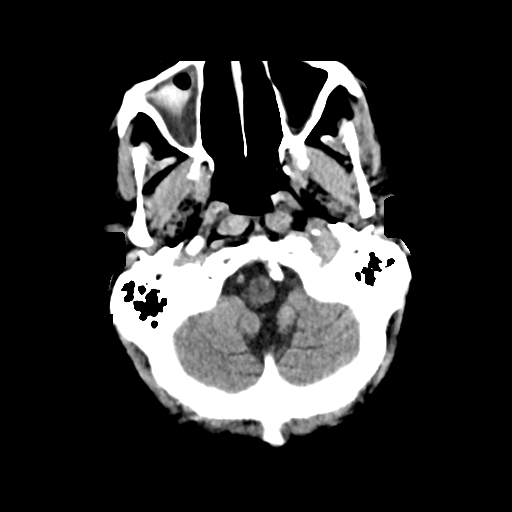
[im 5/34  bone]
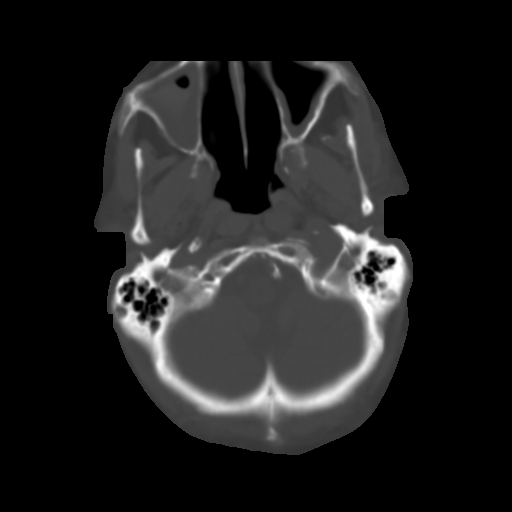
[im 9/34  brain]
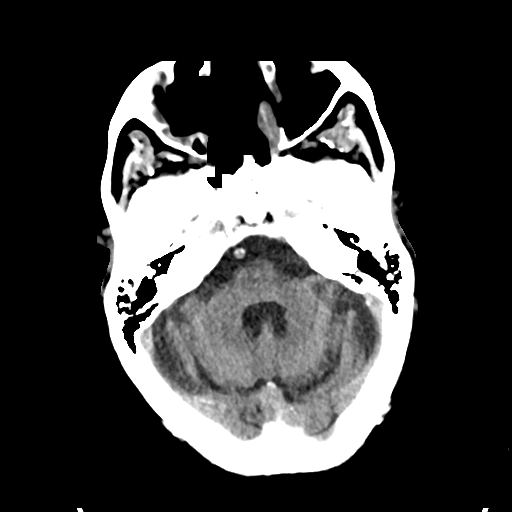
[im 13/34  brain]
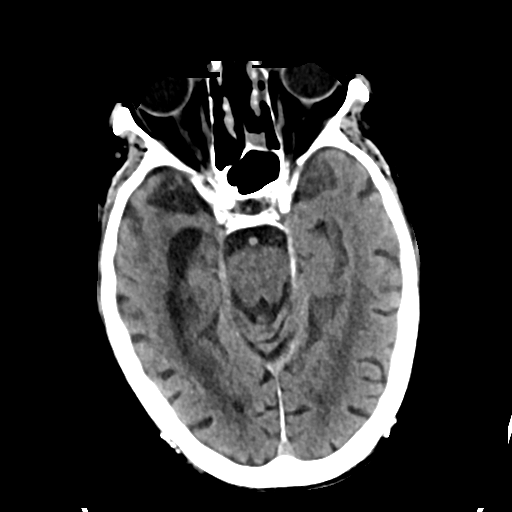
[im 17/34  brain]
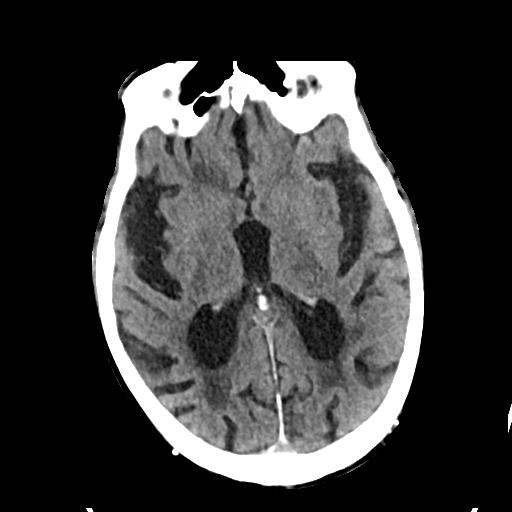
[im 21/34  brain]
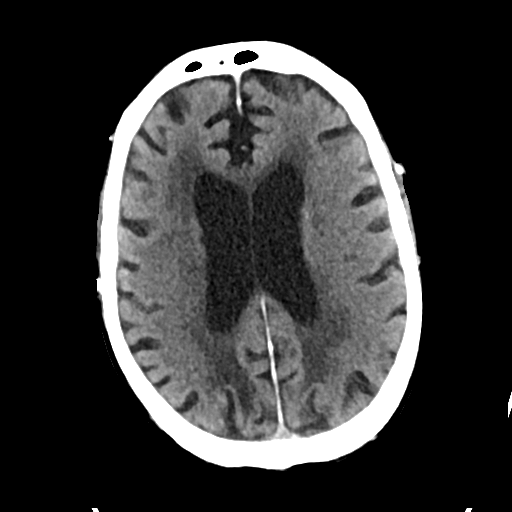
[im 21/34  bone]
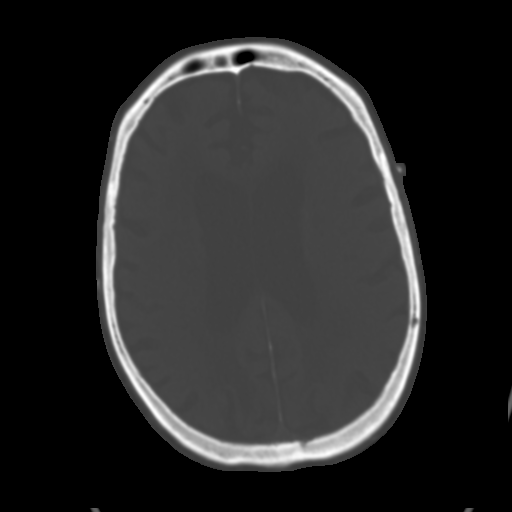
[im 25/34  brain]
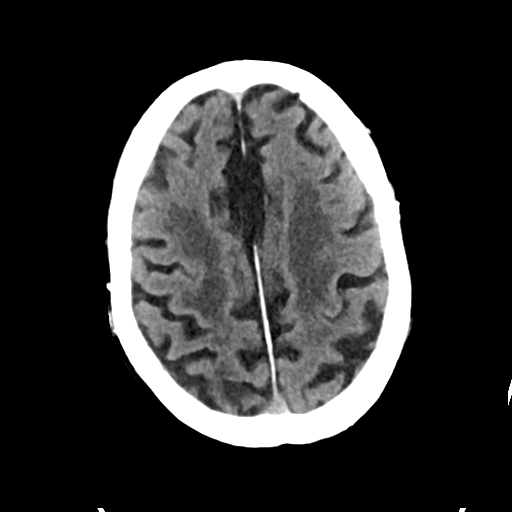
[im 29/34  brain]
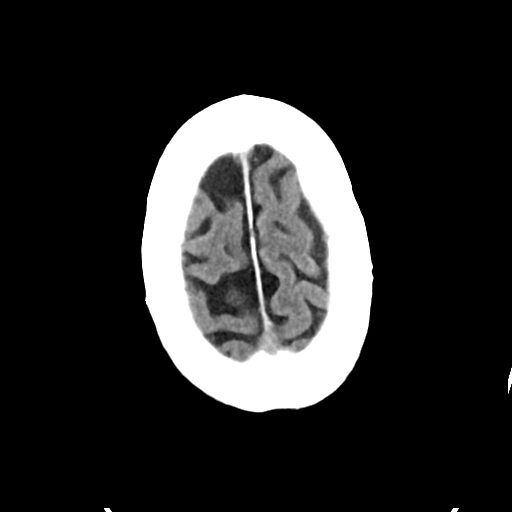

[Series 4: head bone · axial · 0.44mm/px · z∈[-274,-258]mm · 2 of 83 slices shown]
[im 9/83  bone]
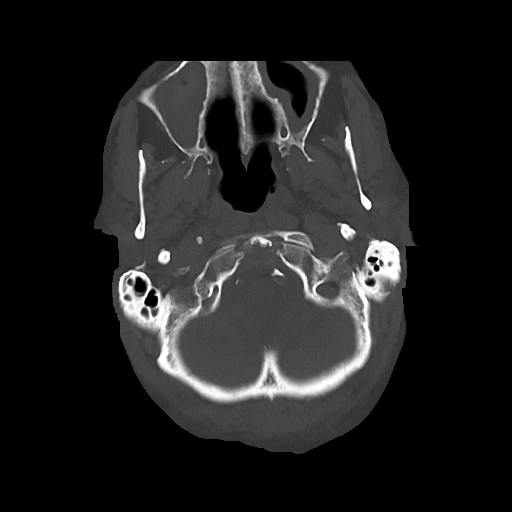
[im 17/83  bone]
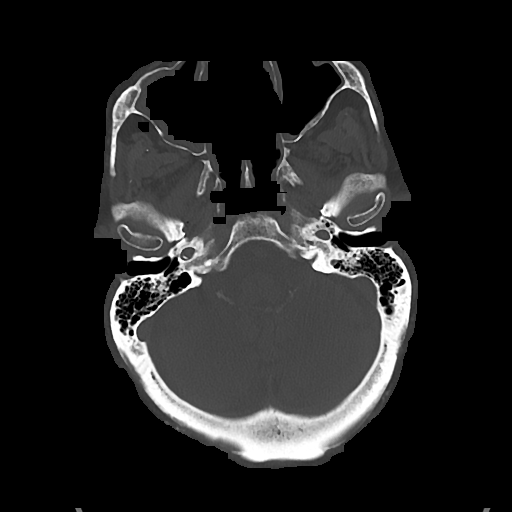

[Series 5: cor soft · coronal · 0.32mm/px · 3 of 73 slices shown]
[im 25/73  brain]
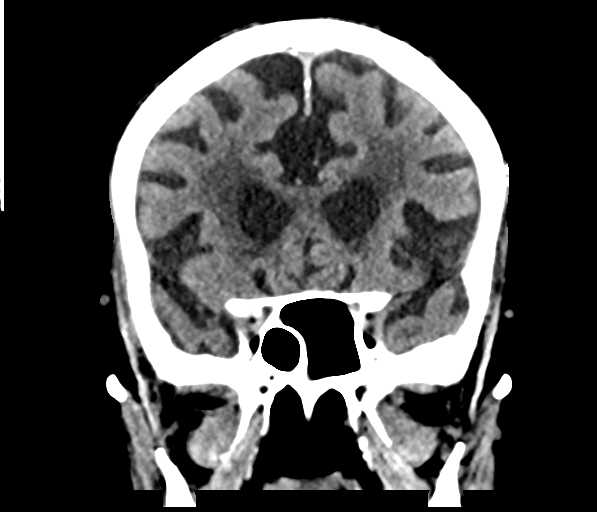
[im 33/73  brain]
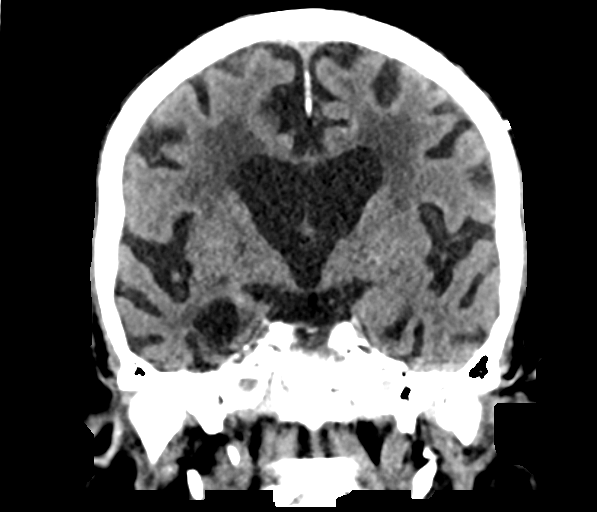
[im 41/73  brain]
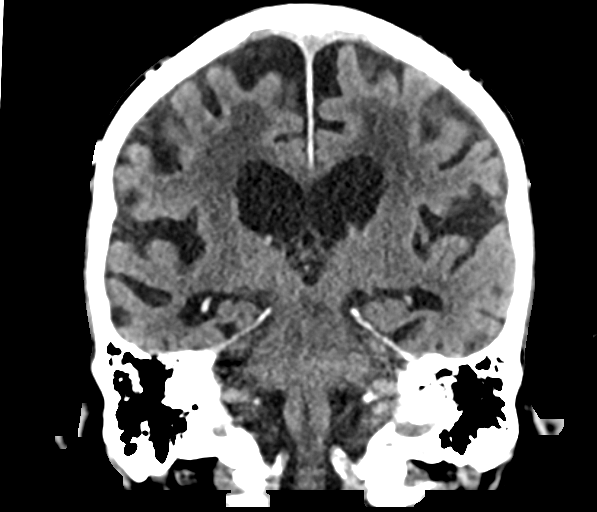

[Series 6: sag soft · sagittal · 0.32mm/px · 3 of 57 slices shown]
[im 19/57  brain]
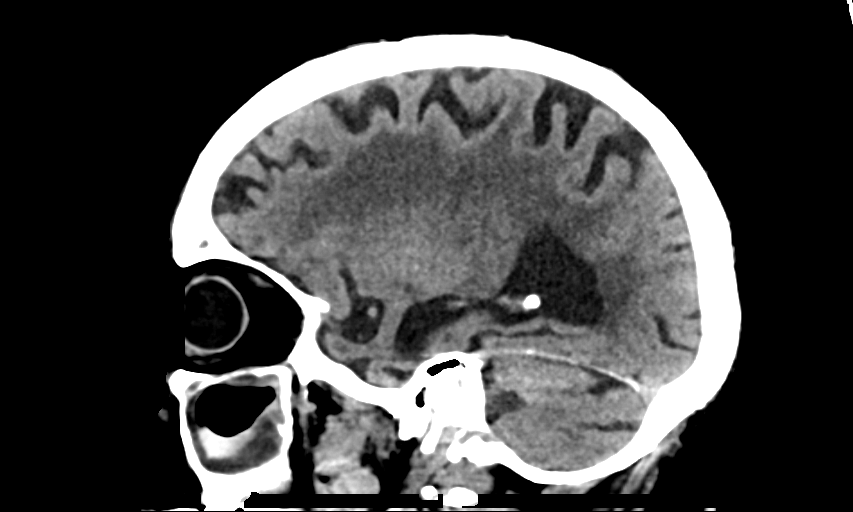
[im 29/57  brain]
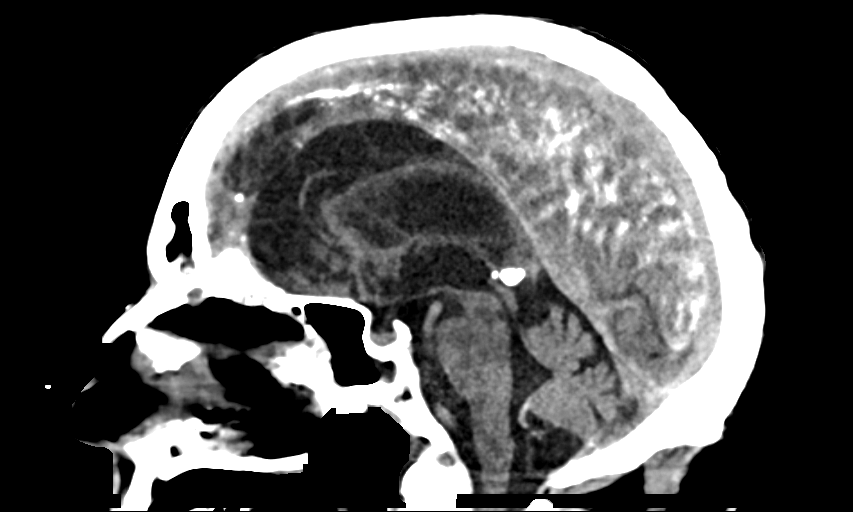
[im 38/57  brain]
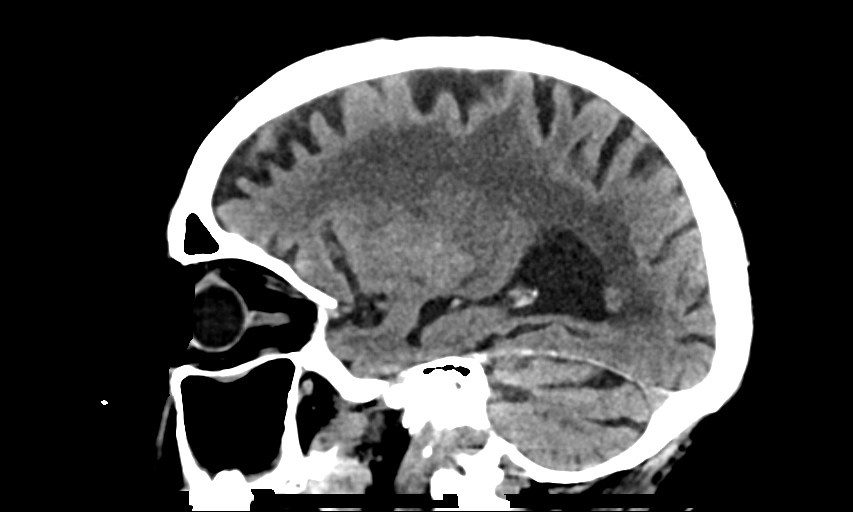

[15 of 47 positions shown; findings below may reference images not displayed]

FINDINGS: Brain: No evidence of acute infarction, hemorrhage, hydrocephalus,
extra-axial collection or mass lesion/mass effect. There is marked
diffuse low-attenuation within the subcortical and periventricular
white matter compatible with chronic microvascular disease. Chronic
brainstem lacunar infarct identified. Prominence of the sulci and
ventricles identified compatible with brain atrophy.

Vascular: No hyperdense vessel or unexpected calcification.

Skull: Normal. Negative for fracture or focal lesion.

Sinuses/Orbits: There are postoperative changes from bilateral
ethmoidectomy and median antrectomy. Chronic mucosal thickening
involving the frontal sinuses and sphenoid sinus and bilateral
maxillary sinuses noted. The mastoid air cells appear clear.

Other: None
IMPRESSION: 1. Chronic small vessel ischemic disease and brain atrophy. No acute
intracranial abnormality noted.
2. Previous sinus surgery with chronic sinus inflammation.

## 2019-08-06 IMAGING — CR DG CHEST 2V
2 series · 2 of 2 positions shown · non-contrast
Comparison: Portable chest x-ray of 09/06/2017

CLINICAL DATA: Congestion, cough, shortness of breath

EXAM:
CHEST - 2 VIEW

[chest lat]
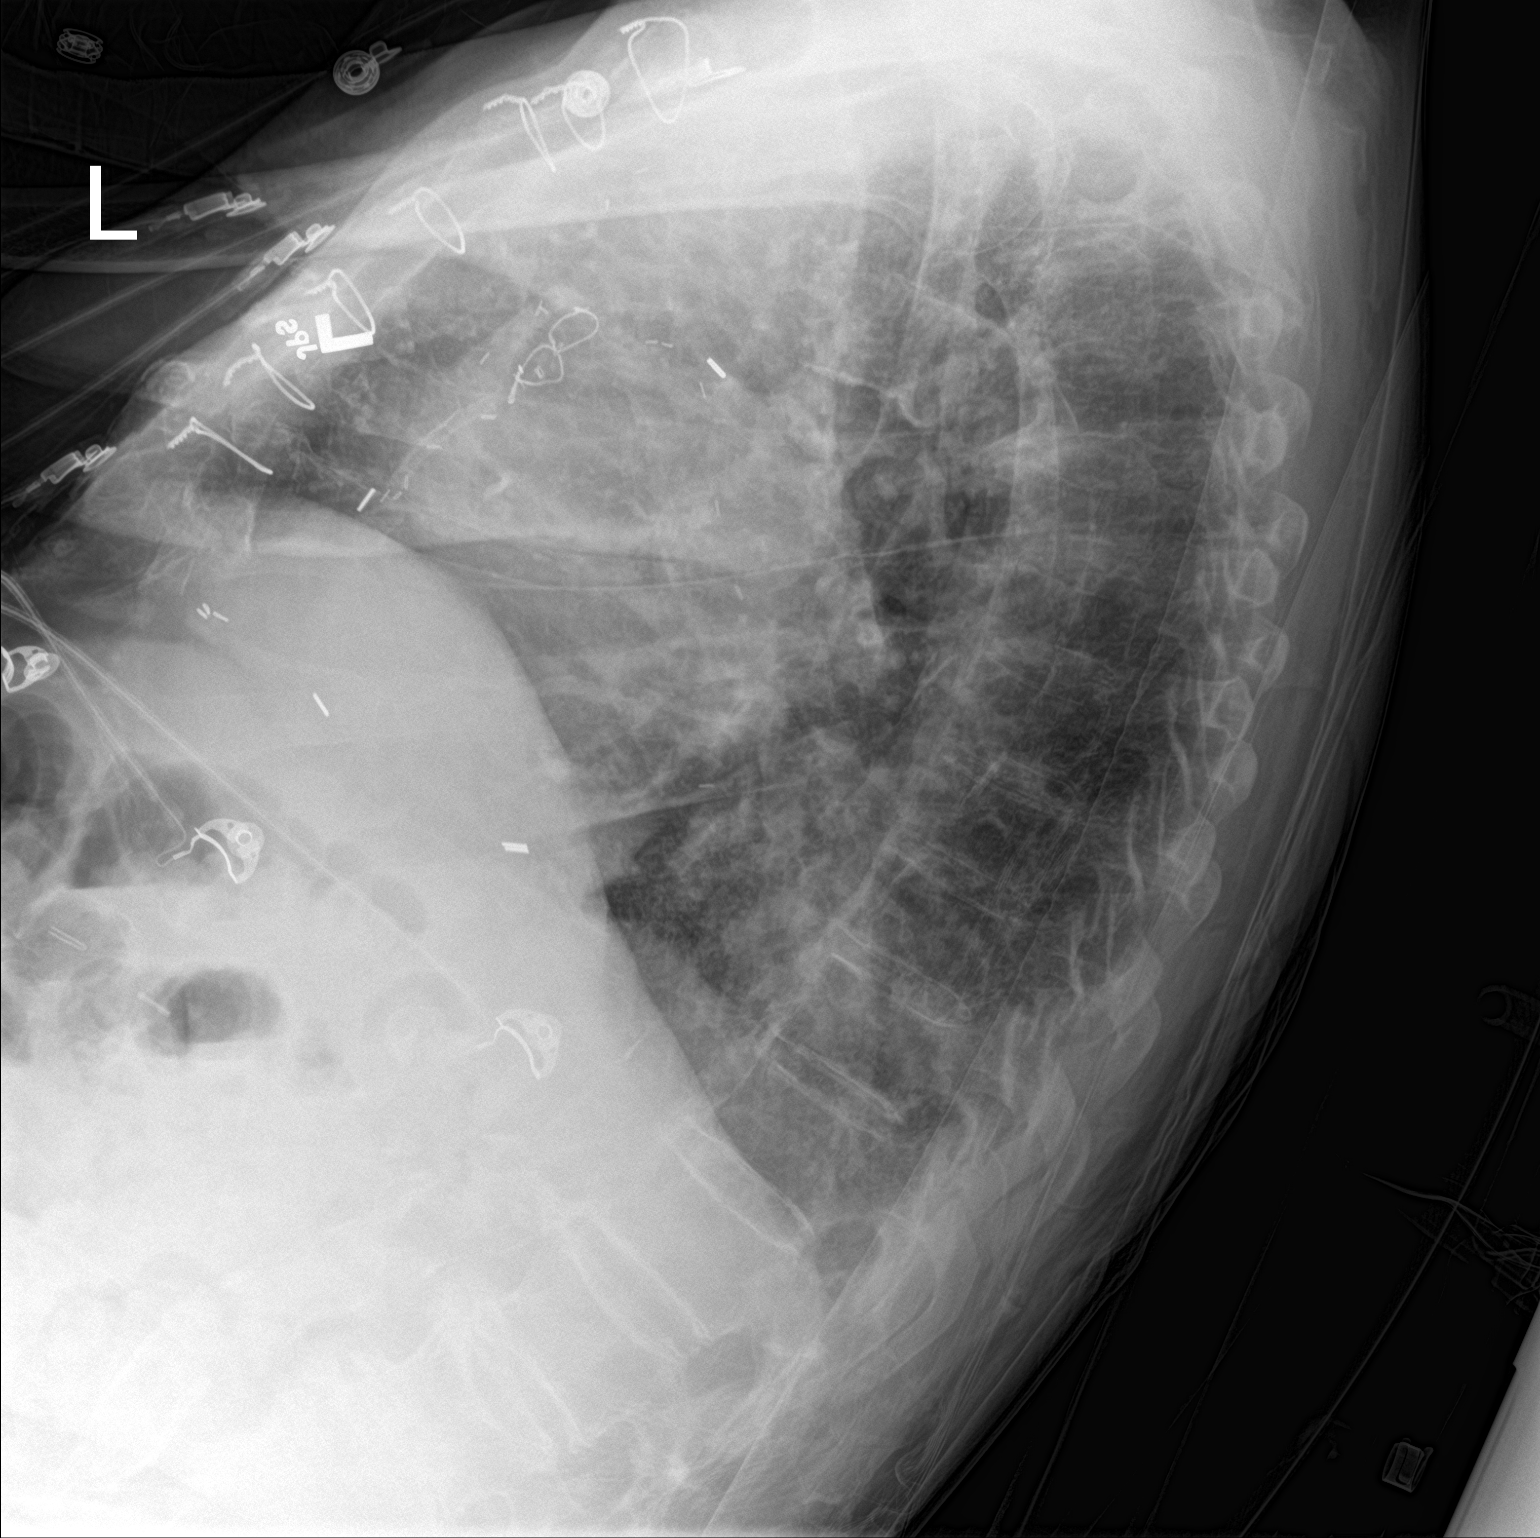

[chest ap]
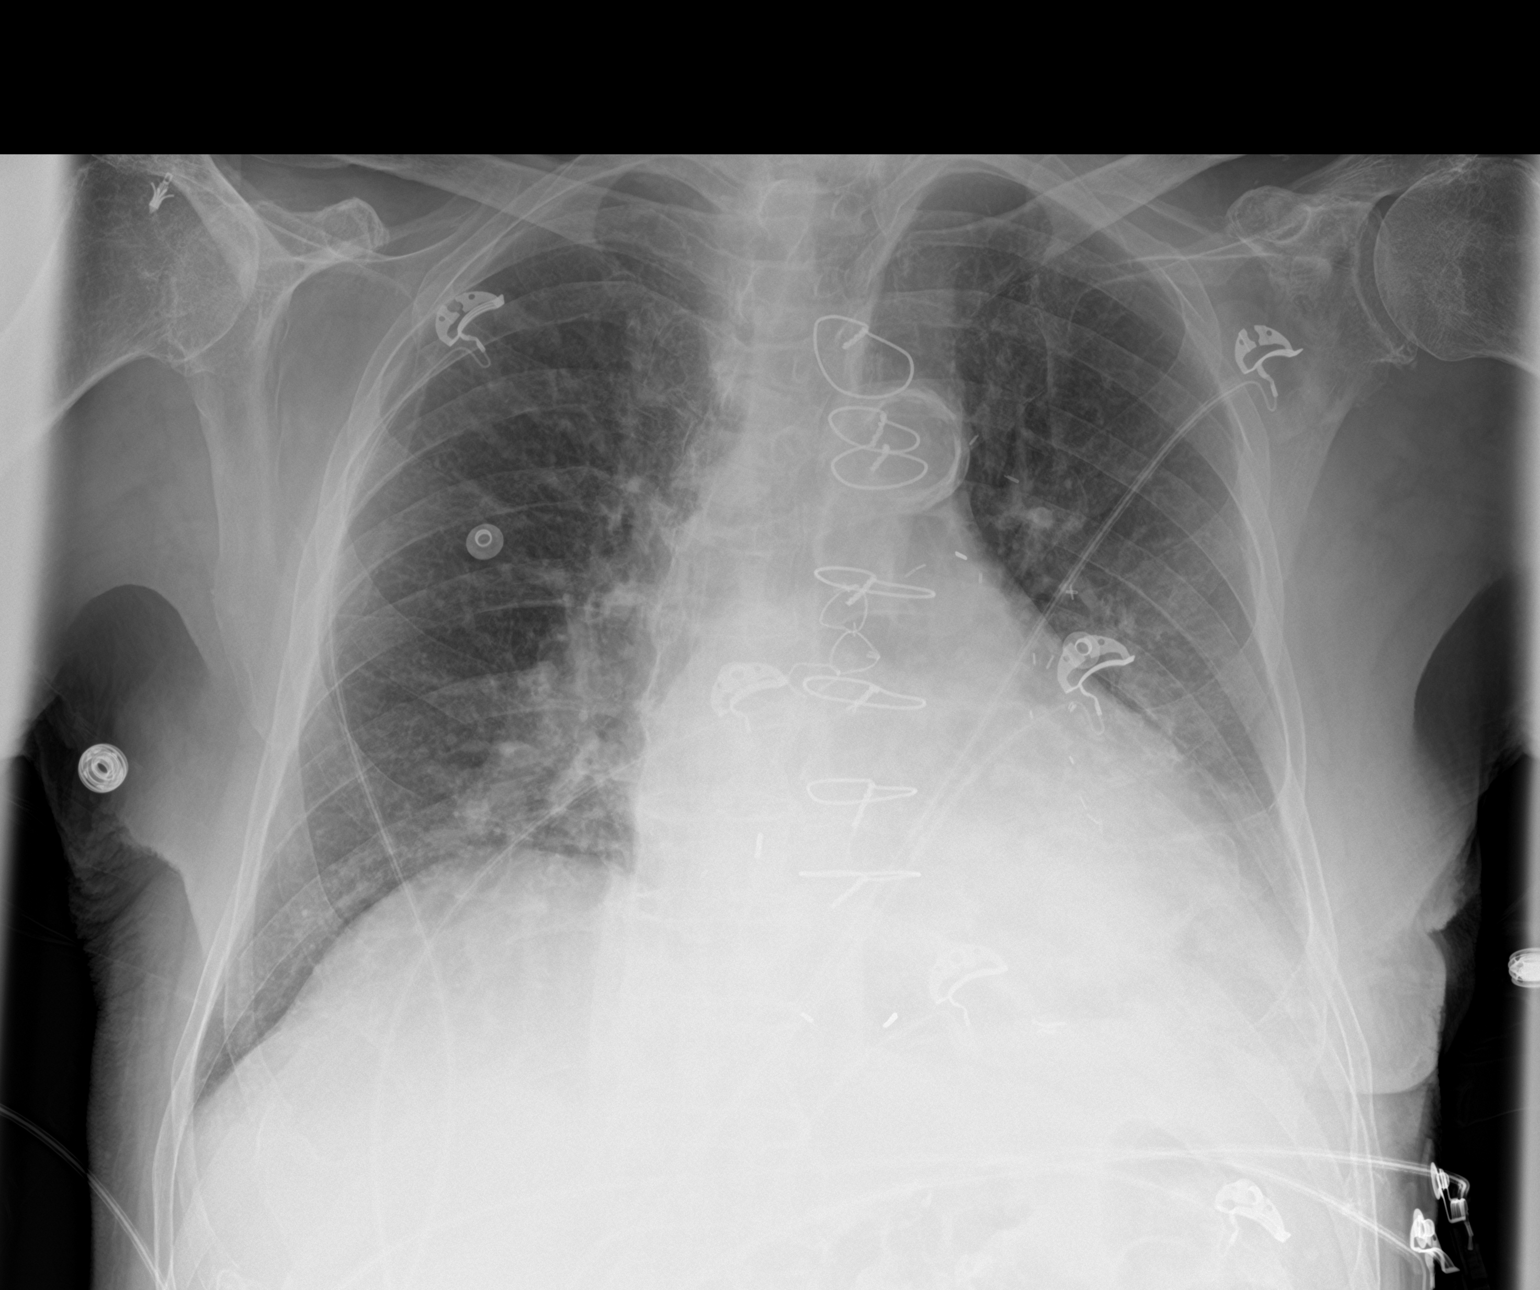

[2 of 2 positions shown; findings below may reference images not displayed]

FINDINGS: There is opacity posteriorly at the medial left lung base consistent
with left lower lobe pneumonia. A small left pleural effusion cannot
be excluded. The right lung appears clear. Cardiomegaly is stable.
Median sternotomy sutures are noted from prior CABG. There are
degenerative changes in the mid to lower thoracic spine.
IMPRESSION: 1. Left lower lobe opacity consistent with pneumonia.
2. Stable cardiomegaly.
